# Patient Record
Sex: Female | Born: 1935 | Race: White | Hispanic: No | Smoking: Never smoker
Health system: Southern US, Community
[De-identification: ages and names within clinical notes are randomized; demographics above are authoritative.]

## PROBLEM LIST (undated history)

## (undated) ENCOUNTER — Ambulatory Visit: Admission: EM | Source: Home / Self Care

## (undated) DIAGNOSIS — M8580 Other specified disorders of bone density and structure, unspecified site: Secondary | ICD-10-CM

## (undated) DIAGNOSIS — I719 Aortic aneurysm of unspecified site, without rupture: Secondary | ICD-10-CM

## (undated) DIAGNOSIS — M25559 Pain in unspecified hip: Secondary | ICD-10-CM

## (undated) DIAGNOSIS — E785 Hyperlipidemia, unspecified: Secondary | ICD-10-CM

## (undated) DIAGNOSIS — F419 Anxiety disorder, unspecified: Secondary | ICD-10-CM

## (undated) DIAGNOSIS — I350 Nonrheumatic aortic (valve) stenosis: Secondary | ICD-10-CM

## (undated) DIAGNOSIS — H9190 Unspecified hearing loss, unspecified ear: Secondary | ICD-10-CM

## (undated) DIAGNOSIS — G8929 Other chronic pain: Secondary | ICD-10-CM

## (undated) DIAGNOSIS — M1990 Unspecified osteoarthritis, unspecified site: Secondary | ICD-10-CM

## (undated) DIAGNOSIS — I2510 Atherosclerotic heart disease of native coronary artery without angina pectoris: Secondary | ICD-10-CM

## (undated) DIAGNOSIS — I10 Essential (primary) hypertension: Secondary | ICD-10-CM

## (undated) DIAGNOSIS — N809 Endometriosis, unspecified: Secondary | ICD-10-CM

## (undated) DIAGNOSIS — N3946 Mixed incontinence: Secondary | ICD-10-CM

## (undated) HISTORY — DX: Other specified disorders of bone density and structure, unspecified site: M85.80

## (undated) HISTORY — DX: Mixed incontinence: N39.46

## (undated) HISTORY — DX: Atherosclerotic heart disease of native coronary artery without angina pectoris: I25.10

## (undated) HISTORY — DX: Aortic aneurysm of unspecified site, without rupture: I71.9

## (undated) HISTORY — PX: APPENDECTOMY: SHX54

## (undated) HISTORY — DX: Essential (primary) hypertension: I10

## (undated) HISTORY — DX: Pain in unspecified hip: M25.559

## (undated) HISTORY — DX: Endometriosis, unspecified: N80.9

## (undated) HISTORY — DX: Hyperlipidemia, unspecified: E78.5

## (undated) HISTORY — DX: Other chronic pain: G89.29

## (undated) HISTORY — DX: Anxiety disorder, unspecified: F41.9

## (undated) HISTORY — DX: Unspecified hearing loss, unspecified ear: H91.90

## (undated) HISTORY — DX: Nonrheumatic aortic (valve) stenosis: I35.0

## (undated) HISTORY — DX: Unspecified osteoarthritis, unspecified site: M19.90

---

## 1974-08-08 HISTORY — PX: ABDOMINAL HYSTERECTOMY: SHX81

## 1985-08-08 HISTORY — PX: BREAST EXCISIONAL BIOPSY: SUR124

## 2002-03-05 ENCOUNTER — Other Ambulatory Visit: Admission: RE | Admit: 2002-03-05 | Discharge: 2002-03-05 | Payer: Self-pay | Admitting: Internal Medicine

## 2004-06-03 ENCOUNTER — Ambulatory Visit: Payer: Self-pay | Admitting: Gastroenterology

## 2005-04-04 ENCOUNTER — Ambulatory Visit: Payer: Self-pay | Admitting: Internal Medicine

## 2006-04-13 ENCOUNTER — Ambulatory Visit: Payer: Self-pay | Admitting: Internal Medicine

## 2006-05-04 ENCOUNTER — Other Ambulatory Visit: Payer: Self-pay

## 2006-05-04 ENCOUNTER — Emergency Department: Payer: Self-pay | Admitting: Emergency Medicine

## 2007-04-26 ENCOUNTER — Ambulatory Visit: Payer: Self-pay | Admitting: Internal Medicine

## 2008-04-30 ENCOUNTER — Ambulatory Visit: Payer: Self-pay | Admitting: Internal Medicine

## 2009-05-04 ENCOUNTER — Ambulatory Visit: Payer: Self-pay | Admitting: Internal Medicine

## 2010-03-05 ENCOUNTER — Ambulatory Visit: Payer: Self-pay | Admitting: Internal Medicine

## 2010-05-11 ENCOUNTER — Ambulatory Visit: Payer: Self-pay | Admitting: Internal Medicine

## 2011-03-07 ENCOUNTER — Emergency Department: Payer: Self-pay | Admitting: *Deleted

## 2011-06-16 ENCOUNTER — Ambulatory Visit: Payer: Self-pay | Admitting: Internal Medicine

## 2011-09-20 DIAGNOSIS — M999 Biomechanical lesion, unspecified: Secondary | ICD-10-CM | POA: Diagnosis not present

## 2011-09-20 DIAGNOSIS — M5137 Other intervertebral disc degeneration, lumbosacral region: Secondary | ICD-10-CM | POA: Diagnosis not present

## 2011-09-20 DIAGNOSIS — M531 Cervicobrachial syndrome: Secondary | ICD-10-CM | POA: Diagnosis not present

## 2011-09-20 DIAGNOSIS — M9981 Other biomechanical lesions of cervical region: Secondary | ICD-10-CM | POA: Diagnosis not present

## 2011-09-20 DIAGNOSIS — M545 Low back pain, unspecified: Secondary | ICD-10-CM | POA: Diagnosis not present

## 2011-10-03 DIAGNOSIS — M545 Low back pain, unspecified: Secondary | ICD-10-CM | POA: Diagnosis not present

## 2011-10-03 DIAGNOSIS — M9981 Other biomechanical lesions of cervical region: Secondary | ICD-10-CM | POA: Diagnosis not present

## 2011-10-03 DIAGNOSIS — M531 Cervicobrachial syndrome: Secondary | ICD-10-CM | POA: Diagnosis not present

## 2011-10-03 DIAGNOSIS — M5137 Other intervertebral disc degeneration, lumbosacral region: Secondary | ICD-10-CM | POA: Diagnosis not present

## 2011-10-03 DIAGNOSIS — M999 Biomechanical lesion, unspecified: Secondary | ICD-10-CM | POA: Diagnosis not present

## 2011-10-06 DIAGNOSIS — IMO0002 Reserved for concepts with insufficient information to code with codable children: Secondary | ICD-10-CM | POA: Diagnosis not present

## 2011-10-10 DIAGNOSIS — M2040 Other hammer toe(s) (acquired), unspecified foot: Secondary | ICD-10-CM | POA: Diagnosis not present

## 2011-10-11 DIAGNOSIS — M545 Low back pain, unspecified: Secondary | ICD-10-CM | POA: Diagnosis not present

## 2011-10-11 DIAGNOSIS — M5137 Other intervertebral disc degeneration, lumbosacral region: Secondary | ICD-10-CM | POA: Diagnosis not present

## 2011-10-25 DIAGNOSIS — M545 Low back pain, unspecified: Secondary | ICD-10-CM | POA: Diagnosis not present

## 2011-10-25 DIAGNOSIS — IMO0002 Reserved for concepts with insufficient information to code with codable children: Secondary | ICD-10-CM | POA: Diagnosis not present

## 2011-10-25 DIAGNOSIS — M5137 Other intervertebral disc degeneration, lumbosacral region: Secondary | ICD-10-CM | POA: Diagnosis not present

## 2011-10-28 DIAGNOSIS — I10 Essential (primary) hypertension: Secondary | ICD-10-CM | POA: Diagnosis not present

## 2011-10-28 DIAGNOSIS — I253 Aneurysm of heart: Secondary | ICD-10-CM | POA: Diagnosis not present

## 2011-10-28 DIAGNOSIS — I2510 Atherosclerotic heart disease of native coronary artery without angina pectoris: Secondary | ICD-10-CM | POA: Diagnosis not present

## 2011-10-28 DIAGNOSIS — M810 Age-related osteoporosis without current pathological fracture: Secondary | ICD-10-CM | POA: Diagnosis not present

## 2011-10-28 DIAGNOSIS — R143 Flatulence: Secondary | ICD-10-CM | POA: Diagnosis not present

## 2011-10-28 DIAGNOSIS — R141 Gas pain: Secondary | ICD-10-CM | POA: Diagnosis not present

## 2011-11-01 DIAGNOSIS — IMO0002 Reserved for concepts with insufficient information to code with codable children: Secondary | ICD-10-CM | POA: Diagnosis not present

## 2011-11-01 DIAGNOSIS — M5137 Other intervertebral disc degeneration, lumbosacral region: Secondary | ICD-10-CM | POA: Diagnosis not present

## 2011-11-01 DIAGNOSIS — M545 Low back pain, unspecified: Secondary | ICD-10-CM | POA: Diagnosis not present

## 2011-11-14 DIAGNOSIS — E785 Hyperlipidemia, unspecified: Secondary | ICD-10-CM | POA: Diagnosis not present

## 2011-11-14 DIAGNOSIS — I253 Aneurysm of heart: Secondary | ICD-10-CM | POA: Diagnosis not present

## 2011-11-14 DIAGNOSIS — I2510 Atherosclerotic heart disease of native coronary artery without angina pectoris: Secondary | ICD-10-CM | POA: Diagnosis not present

## 2011-11-14 DIAGNOSIS — I10 Essential (primary) hypertension: Secondary | ICD-10-CM | POA: Diagnosis not present

## 2011-11-22 DIAGNOSIS — M545 Low back pain, unspecified: Secondary | ICD-10-CM | POA: Diagnosis not present

## 2011-11-22 DIAGNOSIS — IMO0002 Reserved for concepts with insufficient information to code with codable children: Secondary | ICD-10-CM | POA: Diagnosis not present

## 2011-11-22 DIAGNOSIS — M5137 Other intervertebral disc degeneration, lumbosacral region: Secondary | ICD-10-CM | POA: Diagnosis not present

## 2011-12-06 DIAGNOSIS — IMO0002 Reserved for concepts with insufficient information to code with codable children: Secondary | ICD-10-CM | POA: Diagnosis not present

## 2011-12-06 DIAGNOSIS — M545 Low back pain, unspecified: Secondary | ICD-10-CM | POA: Diagnosis not present

## 2011-12-06 DIAGNOSIS — M5137 Other intervertebral disc degeneration, lumbosacral region: Secondary | ICD-10-CM | POA: Diagnosis not present

## 2011-12-19 DIAGNOSIS — H169 Unspecified keratitis: Secondary | ICD-10-CM | POA: Diagnosis not present

## 2012-01-21 DIAGNOSIS — IMO0002 Reserved for concepts with insufficient information to code with codable children: Secondary | ICD-10-CM | POA: Diagnosis not present

## 2012-01-21 DIAGNOSIS — M999 Biomechanical lesion, unspecified: Secondary | ICD-10-CM | POA: Diagnosis not present

## 2012-01-21 DIAGNOSIS — M9981 Other biomechanical lesions of cervical region: Secondary | ICD-10-CM | POA: Diagnosis not present

## 2012-01-21 DIAGNOSIS — M530 Cervicocranial syndrome: Secondary | ICD-10-CM | POA: Diagnosis not present

## 2012-01-23 DIAGNOSIS — IMO0002 Reserved for concepts with insufficient information to code with codable children: Secondary | ICD-10-CM | POA: Diagnosis not present

## 2012-01-23 DIAGNOSIS — M530 Cervicocranial syndrome: Secondary | ICD-10-CM | POA: Diagnosis not present

## 2012-01-23 DIAGNOSIS — M999 Biomechanical lesion, unspecified: Secondary | ICD-10-CM | POA: Diagnosis not present

## 2012-01-23 DIAGNOSIS — M9981 Other biomechanical lesions of cervical region: Secondary | ICD-10-CM | POA: Diagnosis not present

## 2012-01-25 DIAGNOSIS — M9981 Other biomechanical lesions of cervical region: Secondary | ICD-10-CM | POA: Diagnosis not present

## 2012-01-25 DIAGNOSIS — M999 Biomechanical lesion, unspecified: Secondary | ICD-10-CM | POA: Diagnosis not present

## 2012-01-25 DIAGNOSIS — IMO0002 Reserved for concepts with insufficient information to code with codable children: Secondary | ICD-10-CM | POA: Diagnosis not present

## 2012-01-25 DIAGNOSIS — M531 Cervicobrachial syndrome: Secondary | ICD-10-CM | POA: Diagnosis not present

## 2012-01-27 DIAGNOSIS — M9981 Other biomechanical lesions of cervical region: Secondary | ICD-10-CM | POA: Diagnosis not present

## 2012-01-27 DIAGNOSIS — M999 Biomechanical lesion, unspecified: Secondary | ICD-10-CM | POA: Diagnosis not present

## 2012-01-27 DIAGNOSIS — IMO0002 Reserved for concepts with insufficient information to code with codable children: Secondary | ICD-10-CM | POA: Diagnosis not present

## 2012-01-27 DIAGNOSIS — M531 Cervicobrachial syndrome: Secondary | ICD-10-CM | POA: Diagnosis not present

## 2012-02-02 DIAGNOSIS — M531 Cervicobrachial syndrome: Secondary | ICD-10-CM | POA: Diagnosis not present

## 2012-02-02 DIAGNOSIS — M999 Biomechanical lesion, unspecified: Secondary | ICD-10-CM | POA: Diagnosis not present

## 2012-02-02 DIAGNOSIS — IMO0002 Reserved for concepts with insufficient information to code with codable children: Secondary | ICD-10-CM | POA: Diagnosis not present

## 2012-02-02 DIAGNOSIS — M9981 Other biomechanical lesions of cervical region: Secondary | ICD-10-CM | POA: Diagnosis not present

## 2012-02-06 DIAGNOSIS — M531 Cervicobrachial syndrome: Secondary | ICD-10-CM | POA: Diagnosis not present

## 2012-02-06 DIAGNOSIS — M999 Biomechanical lesion, unspecified: Secondary | ICD-10-CM | POA: Diagnosis not present

## 2012-02-06 DIAGNOSIS — M9981 Other biomechanical lesions of cervical region: Secondary | ICD-10-CM | POA: Diagnosis not present

## 2012-02-06 DIAGNOSIS — IMO0002 Reserved for concepts with insufficient information to code with codable children: Secondary | ICD-10-CM | POA: Diagnosis not present

## 2012-02-10 DIAGNOSIS — M531 Cervicobrachial syndrome: Secondary | ICD-10-CM | POA: Diagnosis not present

## 2012-02-10 DIAGNOSIS — M9981 Other biomechanical lesions of cervical region: Secondary | ICD-10-CM | POA: Diagnosis not present

## 2012-02-10 DIAGNOSIS — M999 Biomechanical lesion, unspecified: Secondary | ICD-10-CM | POA: Diagnosis not present

## 2012-02-10 DIAGNOSIS — IMO0002 Reserved for concepts with insufficient information to code with codable children: Secondary | ICD-10-CM | POA: Diagnosis not present

## 2012-02-20 DIAGNOSIS — M9981 Other biomechanical lesions of cervical region: Secondary | ICD-10-CM | POA: Diagnosis not present

## 2012-02-20 DIAGNOSIS — IMO0002 Reserved for concepts with insufficient information to code with codable children: Secondary | ICD-10-CM | POA: Diagnosis not present

## 2012-02-20 DIAGNOSIS — M999 Biomechanical lesion, unspecified: Secondary | ICD-10-CM | POA: Diagnosis not present

## 2012-02-20 DIAGNOSIS — M531 Cervicobrachial syndrome: Secondary | ICD-10-CM | POA: Diagnosis not present

## 2012-03-08 DIAGNOSIS — M531 Cervicobrachial syndrome: Secondary | ICD-10-CM | POA: Diagnosis not present

## 2012-03-08 DIAGNOSIS — M999 Biomechanical lesion, unspecified: Secondary | ICD-10-CM | POA: Diagnosis not present

## 2012-03-08 DIAGNOSIS — IMO0002 Reserved for concepts with insufficient information to code with codable children: Secondary | ICD-10-CM | POA: Diagnosis not present

## 2012-03-08 DIAGNOSIS — M9981 Other biomechanical lesions of cervical region: Secondary | ICD-10-CM | POA: Diagnosis not present

## 2012-03-26 DIAGNOSIS — M531 Cervicobrachial syndrome: Secondary | ICD-10-CM | POA: Diagnosis not present

## 2012-03-26 DIAGNOSIS — M9981 Other biomechanical lesions of cervical region: Secondary | ICD-10-CM | POA: Diagnosis not present

## 2012-03-26 DIAGNOSIS — M999 Biomechanical lesion, unspecified: Secondary | ICD-10-CM | POA: Diagnosis not present

## 2012-03-26 DIAGNOSIS — IMO0002 Reserved for concepts with insufficient information to code with codable children: Secondary | ICD-10-CM | POA: Diagnosis not present

## 2012-05-02 DIAGNOSIS — M810 Age-related osteoporosis without current pathological fracture: Secondary | ICD-10-CM | POA: Diagnosis not present

## 2012-05-02 DIAGNOSIS — E785 Hyperlipidemia, unspecified: Secondary | ICD-10-CM | POA: Diagnosis not present

## 2012-05-02 DIAGNOSIS — Z0000 Encounter for general adult medical examination without abnormal findings: Secondary | ICD-10-CM | POA: Diagnosis not present

## 2012-05-02 DIAGNOSIS — Z23 Encounter for immunization: Secondary | ICD-10-CM | POA: Diagnosis not present

## 2012-05-02 DIAGNOSIS — I10 Essential (primary) hypertension: Secondary | ICD-10-CM | POA: Diagnosis not present

## 2012-05-11 DIAGNOSIS — R7309 Other abnormal glucose: Secondary | ICD-10-CM | POA: Diagnosis not present

## 2012-05-31 DIAGNOSIS — H905 Unspecified sensorineural hearing loss: Secondary | ICD-10-CM | POA: Diagnosis not present

## 2012-05-31 DIAGNOSIS — H903 Sensorineural hearing loss, bilateral: Secondary | ICD-10-CM | POA: Diagnosis not present

## 2012-06-28 ENCOUNTER — Ambulatory Visit: Payer: Self-pay

## 2012-06-28 DIAGNOSIS — Z1231 Encounter for screening mammogram for malignant neoplasm of breast: Secondary | ICD-10-CM | POA: Diagnosis not present

## 2012-06-28 DIAGNOSIS — N6459 Other signs and symptoms in breast: Secondary | ICD-10-CM | POA: Diagnosis not present

## 2012-07-02 DIAGNOSIS — IMO0002 Reserved for concepts with insufficient information to code with codable children: Secondary | ICD-10-CM | POA: Diagnosis not present

## 2012-07-02 DIAGNOSIS — M531 Cervicobrachial syndrome: Secondary | ICD-10-CM | POA: Diagnosis not present

## 2012-07-02 DIAGNOSIS — M9981 Other biomechanical lesions of cervical region: Secondary | ICD-10-CM | POA: Diagnosis not present

## 2012-07-02 DIAGNOSIS — M999 Biomechanical lesion, unspecified: Secondary | ICD-10-CM | POA: Diagnosis not present

## 2012-07-03 ENCOUNTER — Ambulatory Visit: Payer: Self-pay

## 2012-07-03 DIAGNOSIS — N6459 Other signs and symptoms in breast: Secondary | ICD-10-CM | POA: Diagnosis not present

## 2012-07-16 DIAGNOSIS — M531 Cervicobrachial syndrome: Secondary | ICD-10-CM | POA: Diagnosis not present

## 2012-07-16 DIAGNOSIS — M999 Biomechanical lesion, unspecified: Secondary | ICD-10-CM | POA: Diagnosis not present

## 2012-07-16 DIAGNOSIS — IMO0002 Reserved for concepts with insufficient information to code with codable children: Secondary | ICD-10-CM | POA: Diagnosis not present

## 2012-07-16 DIAGNOSIS — M9981 Other biomechanical lesions of cervical region: Secondary | ICD-10-CM | POA: Diagnosis not present

## 2012-08-03 DIAGNOSIS — M531 Cervicobrachial syndrome: Secondary | ICD-10-CM | POA: Diagnosis not present

## 2012-08-03 DIAGNOSIS — M9981 Other biomechanical lesions of cervical region: Secondary | ICD-10-CM | POA: Diagnosis not present

## 2012-08-03 DIAGNOSIS — M999 Biomechanical lesion, unspecified: Secondary | ICD-10-CM | POA: Diagnosis not present

## 2012-08-03 DIAGNOSIS — IMO0002 Reserved for concepts with insufficient information to code with codable children: Secondary | ICD-10-CM | POA: Diagnosis not present

## 2012-08-16 DIAGNOSIS — M999 Biomechanical lesion, unspecified: Secondary | ICD-10-CM | POA: Diagnosis not present

## 2012-08-16 DIAGNOSIS — M531 Cervicobrachial syndrome: Secondary | ICD-10-CM | POA: Diagnosis not present

## 2012-08-16 DIAGNOSIS — M9981 Other biomechanical lesions of cervical region: Secondary | ICD-10-CM | POA: Diagnosis not present

## 2012-08-16 DIAGNOSIS — IMO0002 Reserved for concepts with insufficient information to code with codable children: Secondary | ICD-10-CM | POA: Diagnosis not present

## 2012-08-28 DIAGNOSIS — M9981 Other biomechanical lesions of cervical region: Secondary | ICD-10-CM | POA: Diagnosis not present

## 2012-08-28 DIAGNOSIS — M531 Cervicobrachial syndrome: Secondary | ICD-10-CM | POA: Diagnosis not present

## 2012-08-28 DIAGNOSIS — M999 Biomechanical lesion, unspecified: Secondary | ICD-10-CM | POA: Diagnosis not present

## 2012-08-28 DIAGNOSIS — IMO0002 Reserved for concepts with insufficient information to code with codable children: Secondary | ICD-10-CM | POA: Diagnosis not present

## 2012-09-10 DIAGNOSIS — M999 Biomechanical lesion, unspecified: Secondary | ICD-10-CM | POA: Diagnosis not present

## 2012-09-10 DIAGNOSIS — IMO0002 Reserved for concepts with insufficient information to code with codable children: Secondary | ICD-10-CM | POA: Diagnosis not present

## 2012-09-10 DIAGNOSIS — M531 Cervicobrachial syndrome: Secondary | ICD-10-CM | POA: Diagnosis not present

## 2012-09-10 DIAGNOSIS — M9981 Other biomechanical lesions of cervical region: Secondary | ICD-10-CM | POA: Diagnosis not present

## 2012-09-24 DIAGNOSIS — M531 Cervicobrachial syndrome: Secondary | ICD-10-CM | POA: Diagnosis not present

## 2012-09-24 DIAGNOSIS — M9981 Other biomechanical lesions of cervical region: Secondary | ICD-10-CM | POA: Diagnosis not present

## 2012-09-24 DIAGNOSIS — IMO0002 Reserved for concepts with insufficient information to code with codable children: Secondary | ICD-10-CM | POA: Diagnosis not present

## 2012-09-24 DIAGNOSIS — M999 Biomechanical lesion, unspecified: Secondary | ICD-10-CM | POA: Diagnosis not present

## 2012-10-03 DIAGNOSIS — E782 Mixed hyperlipidemia: Secondary | ICD-10-CM | POA: Diagnosis not present

## 2012-10-03 DIAGNOSIS — I10 Essential (primary) hypertension: Secondary | ICD-10-CM | POA: Diagnosis not present

## 2012-11-06 DIAGNOSIS — I10 Essential (primary) hypertension: Secondary | ICD-10-CM | POA: Diagnosis not present

## 2012-11-06 DIAGNOSIS — E785 Hyperlipidemia, unspecified: Secondary | ICD-10-CM | POA: Diagnosis not present

## 2012-11-06 DIAGNOSIS — I2510 Atherosclerotic heart disease of native coronary artery without angina pectoris: Secondary | ICD-10-CM | POA: Diagnosis not present

## 2013-04-11 DIAGNOSIS — M461 Sacroiliitis, not elsewhere classified: Secondary | ICD-10-CM | POA: Diagnosis not present

## 2013-04-11 DIAGNOSIS — M259 Joint disorder, unspecified: Secondary | ICD-10-CM | POA: Diagnosis not present

## 2013-04-11 DIAGNOSIS — M999 Biomechanical lesion, unspecified: Secondary | ICD-10-CM | POA: Diagnosis not present

## 2013-04-12 DIAGNOSIS — M461 Sacroiliitis, not elsewhere classified: Secondary | ICD-10-CM | POA: Diagnosis not present

## 2013-04-12 DIAGNOSIS — M999 Biomechanical lesion, unspecified: Secondary | ICD-10-CM | POA: Diagnosis not present

## 2013-04-12 DIAGNOSIS — M259 Joint disorder, unspecified: Secondary | ICD-10-CM | POA: Diagnosis not present

## 2013-04-18 DIAGNOSIS — M722 Plantar fascial fibromatosis: Secondary | ICD-10-CM | POA: Diagnosis not present

## 2013-04-18 DIAGNOSIS — E785 Hyperlipidemia, unspecified: Secondary | ICD-10-CM | POA: Diagnosis not present

## 2013-04-18 DIAGNOSIS — S93409A Sprain of unspecified ligament of unspecified ankle, initial encounter: Secondary | ICD-10-CM | POA: Diagnosis not present

## 2013-06-10 ENCOUNTER — Telehealth: Payer: Self-pay | Admitting: *Deleted

## 2013-06-10 DIAGNOSIS — Z23 Encounter for immunization: Secondary | ICD-10-CM | POA: Diagnosis not present

## 2013-06-10 MED ORDER — AMLODIPINE BESYLATE 5 MG PO TABS: 5.0000 mg | ORAL_TABLET | Freq: Every day | ORAL | Status: DC

## 2013-06-10 NOTE — Telephone Encounter (Signed)
done

## 2013-06-24 DIAGNOSIS — E785 Hyperlipidemia, unspecified: Secondary | ICD-10-CM | POA: Diagnosis not present

## 2013-06-24 DIAGNOSIS — Z0000 Encounter for general adult medical examination without abnormal findings: Secondary | ICD-10-CM | POA: Diagnosis not present

## 2013-06-24 DIAGNOSIS — I10 Essential (primary) hypertension: Secondary | ICD-10-CM | POA: Diagnosis not present

## 2013-06-24 DIAGNOSIS — M899 Disorder of bone, unspecified: Secondary | ICD-10-CM | POA: Diagnosis not present

## 2013-06-27 ENCOUNTER — Other Ambulatory Visit: Payer: Self-pay

## 2013-06-27 MED ORDER — ESTROGENS CONJUGATED 0.625 MG PO TABS: 0.6250 mg | ORAL_TABLET | Freq: Every day | ORAL | Status: DC

## 2013-06-27 MED ORDER — ESTROGENS CONJUGATED 0.625 MG PO TABS: ORAL_TABLET | ORAL | Status: DC

## 2013-06-27 MED ORDER — METOPROLOL SUCCINATE ER 50 MG PO TB24: 50.0000 mg | ORAL_TABLET | Freq: Every day | ORAL | Status: DC

## 2013-06-27 MED ORDER — SIMVASTATIN 40 MG PO TABS: 40.0000 mg | ORAL_TABLET | Freq: Every day | ORAL | Status: DC

## 2013-06-27 MED ORDER — LISINOPRIL 40 MG PO TABS: 40.0000 mg | ORAL_TABLET | Freq: Every day | ORAL | Status: DC

## 2013-06-27 MED ORDER — ESTROGENS, CONJUGATED 0.625 MG/GM VA CREA: 1.0000 | TOPICAL_CREAM | Freq: Every day | VAGINAL | Status: DC

## 2013-07-02 DIAGNOSIS — M259 Joint disorder, unspecified: Secondary | ICD-10-CM | POA: Diagnosis not present

## 2013-07-02 DIAGNOSIS — M461 Sacroiliitis, not elsewhere classified: Secondary | ICD-10-CM | POA: Diagnosis not present

## 2013-07-02 DIAGNOSIS — M999 Biomechanical lesion, unspecified: Secondary | ICD-10-CM | POA: Diagnosis not present

## 2013-07-12 DIAGNOSIS — H903 Sensorineural hearing loss, bilateral: Secondary | ICD-10-CM | POA: Diagnosis not present

## 2013-07-12 DIAGNOSIS — H6120 Impacted cerumen, unspecified ear: Secondary | ICD-10-CM | POA: Diagnosis not present

## 2013-07-18 DIAGNOSIS — M999 Biomechanical lesion, unspecified: Secondary | ICD-10-CM | POA: Diagnosis not present

## 2013-07-18 DIAGNOSIS — IMO0002 Reserved for concepts with insufficient information to code with codable children: Secondary | ICD-10-CM | POA: Diagnosis not present

## 2013-07-18 DIAGNOSIS — M955 Acquired deformity of pelvis: Secondary | ICD-10-CM | POA: Diagnosis not present

## 2013-07-19 ENCOUNTER — Ambulatory Visit: Payer: Self-pay

## 2013-07-19 DIAGNOSIS — Z1231 Encounter for screening mammogram for malignant neoplasm of breast: Secondary | ICD-10-CM | POA: Diagnosis not present

## 2013-07-25 DIAGNOSIS — N3946 Mixed incontinence: Secondary | ICD-10-CM | POA: Diagnosis not present

## 2013-07-25 DIAGNOSIS — I719 Aortic aneurysm of unspecified site, without rupture: Secondary | ICD-10-CM | POA: Diagnosis not present

## 2013-07-25 DIAGNOSIS — E785 Hyperlipidemia, unspecified: Secondary | ICD-10-CM | POA: Diagnosis not present

## 2013-07-25 DIAGNOSIS — I10 Essential (primary) hypertension: Secondary | ICD-10-CM | POA: Diagnosis not present

## 2013-08-06 DIAGNOSIS — M999 Biomechanical lesion, unspecified: Secondary | ICD-10-CM | POA: Diagnosis not present

## 2013-08-06 DIAGNOSIS — M955 Acquired deformity of pelvis: Secondary | ICD-10-CM | POA: Diagnosis not present

## 2013-08-06 DIAGNOSIS — IMO0002 Reserved for concepts with insufficient information to code with codable children: Secondary | ICD-10-CM | POA: Diagnosis not present

## 2013-08-16 DIAGNOSIS — IMO0001 Reserved for inherently not codable concepts without codable children: Secondary | ICD-10-CM | POA: Diagnosis not present

## 2013-08-16 DIAGNOSIS — R293 Abnormal posture: Secondary | ICD-10-CM | POA: Diagnosis not present

## 2013-08-16 DIAGNOSIS — M6281 Muscle weakness (generalized): Secondary | ICD-10-CM | POA: Diagnosis not present

## 2013-08-16 DIAGNOSIS — M545 Low back pain, unspecified: Secondary | ICD-10-CM | POA: Diagnosis not present

## 2013-08-16 DIAGNOSIS — N3946 Mixed incontinence: Secondary | ICD-10-CM | POA: Diagnosis not present

## 2013-08-26 DIAGNOSIS — IMO0002 Reserved for concepts with insufficient information to code with codable children: Secondary | ICD-10-CM | POA: Diagnosis not present

## 2013-08-26 DIAGNOSIS — M999 Biomechanical lesion, unspecified: Secondary | ICD-10-CM | POA: Diagnosis not present

## 2013-08-26 DIAGNOSIS — M955 Acquired deformity of pelvis: Secondary | ICD-10-CM | POA: Diagnosis not present

## 2013-09-08 DIAGNOSIS — M545 Low back pain, unspecified: Secondary | ICD-10-CM | POA: Diagnosis not present

## 2013-09-08 DIAGNOSIS — R293 Abnormal posture: Secondary | ICD-10-CM | POA: Diagnosis not present

## 2013-09-08 DIAGNOSIS — N3946 Mixed incontinence: Secondary | ICD-10-CM | POA: Diagnosis not present

## 2013-09-08 DIAGNOSIS — M6281 Muscle weakness (generalized): Secondary | ICD-10-CM | POA: Diagnosis not present

## 2013-09-08 DIAGNOSIS — IMO0001 Reserved for inherently not codable concepts without codable children: Secondary | ICD-10-CM | POA: Diagnosis not present

## 2013-09-13 DIAGNOSIS — N3946 Mixed incontinence: Secondary | ICD-10-CM | POA: Diagnosis not present

## 2013-09-13 DIAGNOSIS — M25559 Pain in unspecified hip: Secondary | ICD-10-CM | POA: Diagnosis not present

## 2013-09-14 DIAGNOSIS — T169XXA Foreign body in ear, unspecified ear, initial encounter: Secondary | ICD-10-CM | POA: Diagnosis not present

## 2013-09-18 DIAGNOSIS — M549 Dorsalgia, unspecified: Secondary | ICD-10-CM | POA: Diagnosis not present

## 2013-09-30 DIAGNOSIS — IMO0002 Reserved for concepts with insufficient information to code with codable children: Secondary | ICD-10-CM | POA: Diagnosis not present

## 2013-09-30 DIAGNOSIS — M955 Acquired deformity of pelvis: Secondary | ICD-10-CM | POA: Diagnosis not present

## 2013-09-30 DIAGNOSIS — M999 Biomechanical lesion, unspecified: Secondary | ICD-10-CM | POA: Diagnosis not present

## 2013-10-23 ENCOUNTER — Other Ambulatory Visit: Payer: Self-pay | Admitting: Interventional Cardiology

## 2013-10-28 DIAGNOSIS — M955 Acquired deformity of pelvis: Secondary | ICD-10-CM | POA: Diagnosis not present

## 2013-10-28 DIAGNOSIS — IMO0002 Reserved for concepts with insufficient information to code with codable children: Secondary | ICD-10-CM | POA: Diagnosis not present

## 2013-10-28 DIAGNOSIS — M999 Biomechanical lesion, unspecified: Secondary | ICD-10-CM | POA: Diagnosis not present

## 2013-11-06 ENCOUNTER — Encounter: Payer: Self-pay | Admitting: Interventional Cardiology

## 2013-11-06 ENCOUNTER — Ambulatory Visit (INDEPENDENT_AMBULATORY_CARE_PROVIDER_SITE_OTHER): Payer: Medicare Other | Admitting: Interventional Cardiology

## 2013-11-06 VITALS — BP 140/80 | HR 74 | Ht 64.0 in | Wt 187.8 lb

## 2013-11-06 DIAGNOSIS — I2510 Atherosclerotic heart disease of native coronary artery without angina pectoris: Secondary | ICD-10-CM | POA: Diagnosis not present

## 2013-11-06 DIAGNOSIS — E785 Hyperlipidemia, unspecified: Secondary | ICD-10-CM

## 2013-11-06 DIAGNOSIS — I10 Essential (primary) hypertension: Secondary | ICD-10-CM | POA: Insufficient documentation

## 2013-11-06 LAB — LIPID PANEL
Cholesterol: 213 mg/dL — ABNORMAL HIGH (ref 0–200)
HDL: 51.9 mg/dL (ref >39.00)
LDL Cholesterol: 107 mg/dL — ABNORMAL HIGH (ref 0–99)
Total CHOL/HDL Ratio: 4
Triglycerides: 270 mg/dL — ABNORMAL HIGH (ref 0.0–149.0)
VLDL: 54 mg/dL — ABNORMAL HIGH (ref 0.0–40.0)

## 2013-11-06 LAB — BASIC METABOLIC PANEL
BUN: 21 mg/dL (ref 6–23)
CO2: 26 mEq/L (ref 19–32)
Calcium: 8.9 mg/dL (ref 8.4–10.5)
Chloride: 105 mEq/L (ref 96–112)
Creatinine, Ser: 0.6 mg/dL (ref 0.4–1.2)
GFR: 94.67 mL/min (ref >60.00)
Glucose, Bld: 91 mg/dL (ref 70–99)
Potassium: 3.6 mEq/L (ref 3.5–5.1)
Sodium: 138 mEq/L (ref 135–145)

## 2013-11-06 LAB — ALT: ALT: 30 U/L (ref 0–35)

## 2013-11-06 NOTE — Patient Instructions (Addendum)
Your physician recommends that you continue on your current medications as directed. Please refer to the Current Medication list given to you today.  Lab Today: Bmet, Lipid, Alt  Your physician wants you to follow-up in: 1 year You will receive a reminder letter in the mail two months in advance. If you don't receive a letter, please call our office to schedule the follow-up appointment.

## 2013-11-06 NOTE — Progress Notes (Signed)
Patient ID: Angel [name], female   DOB: [birth year], 80 y.o.   MRN: [id number] .    [address], [place]  [address] Phone: [phone] Fax:  [phone]  Date:  11/06/2013   ID:  Angel [name], Angel [name] 10-Nov-1930, MRN [id number]  PCP:  Herbert Moors, MD   ASSESSMENT:  1. Hypertension on 80 mg of lisinopril. Blood pressures control. 2. Presumed coronary artery disease, asymptomatic 3. Hyperlipidemia  PLAN:  1. Check a beam at, lipid, and ALT 2. Clinical followup in one year   SUBJECTIVE: Angel [name] is a 80 y.o. female who has no cardiac complaints. She remains active. She has multiple questions about her medications. We have questions about her dose of lisinopril which is 80 mg per day, higher than the typical upper limit dose of 40 mg daily. She denies orthopnea, PND, cough, chest pain, and edema.   Wt Readings from Last 3 Encounters:  11/06/13 187 lb 12.8 oz (85.186 kg)     No past medical history on file.  Current Outpatient Prescriptions  Medication Sig Dispense Refill  . amLODipine (NORVASC) 5 MG tablet Take 1 tablet (5 mg total) by mouth daily.  30 tablet  5  . busPIRone (BUSPAR) 15 MG tablet as needed.       [name] estrogens, conjugated, (PREMARIN) 0.625 MG tablet TAKE ONE TABLET EVERY OTHER DAY  30 tablet  1  . lisinopril (PRINIVIL,ZESTRIL) 40 MG tablet Take 80 mg by mouth daily.      . metoprolol succinate (TOPROL-XL) 50 MG 24 hr tablet Take 1 tablet (50 mg total) by mouth daily. Take with or immediately following a meal.  30 tablet  0  . simvastatin (ZOCOR) 40 MG tablet Take 1 tablet (40 mg total) by mouth at bedtime.  90 tablet  3   No current facility-administered medications for this visit.    Allergies:   Not on File  Social History:  The patient  reports that she has never smoked. She does not have any smokeless tobacco history on file. She reports that she drinks alcohol. She reports that she does not use illicit drugs.   ROS:   Please see the history of present illness.      All other systems reviewed and negative.   OBJECTIVE: VS:  BP 140/80  Pulse 74  Ht 5\' 4"  (1.626 m)  Wt 187 lb 12.8 oz (85.186 kg)  BMI 32.22 kg/m2 Well nourished, well developed, in no acute distress, appears than stated age HEENT: normal Neck: JVD flat. Carotid bruit absent  Cardiac:  normal S1, S2; RRR; no murmur Lungs:  clear to auscultation bilaterally, no wheezing, rhonchi or rales Abd: soft, nontender, no hepatomegaly Ext: Edema absent. Pulses 2+ Skin: warm and dry Neuro:  CNs 2-12 intact, no focal abnormalities noted  EKG:  Normal sinus rhythm with poor R-wave progression to left atrial abnormality       Signed, Illene Labrador III, MD 11/06/2013 10:21 AM

## 2013-11-11 ENCOUNTER — Telehealth: Payer: Self-pay

## 2013-11-11 NOTE — Telephone Encounter (Signed)
Message copied by Lamar Laundry on Mon Nov 11, 2013  8:11 AM ------      Message from: Daneen Schick      Created: Thu Nov 07, 2013  9:26 AM       BMET is normal.      Lipid panel is Okay. Not ideal, but will not increase dose of therapy. Diet and have PCP repeat in 6 months. Copy to PCP. ------

## 2013-11-11 NOTE — Telephone Encounter (Signed)
pt given lab results.BMET is normal. Lipid panel is Okay. Not ideal, but will not increase dose of therapy. Diet and have PCP repeat in 6 months. Copy to PCP.pt verbalized understanding.

## 2013-11-14 ENCOUNTER — Other Ambulatory Visit: Payer: Self-pay | Admitting: Interventional Cardiology

## 2013-12-10 ENCOUNTER — Other Ambulatory Visit: Payer: Self-pay | Admitting: Interventional Cardiology

## 2013-12-18 DIAGNOSIS — IMO0002 Reserved for concepts with insufficient information to code with codable children: Secondary | ICD-10-CM | POA: Diagnosis not present

## 2013-12-18 DIAGNOSIS — M955 Acquired deformity of pelvis: Secondary | ICD-10-CM | POA: Diagnosis not present

## 2013-12-18 DIAGNOSIS — M999 Biomechanical lesion, unspecified: Secondary | ICD-10-CM | POA: Diagnosis not present

## 2014-01-14 DIAGNOSIS — M955 Acquired deformity of pelvis: Secondary | ICD-10-CM | POA: Diagnosis not present

## 2014-01-14 DIAGNOSIS — IMO0002 Reserved for concepts with insufficient information to code with codable children: Secondary | ICD-10-CM | POA: Diagnosis not present

## 2014-01-14 DIAGNOSIS — M999 Biomechanical lesion, unspecified: Secondary | ICD-10-CM | POA: Diagnosis not present

## 2014-02-10 DIAGNOSIS — M999 Biomechanical lesion, unspecified: Secondary | ICD-10-CM | POA: Diagnosis not present

## 2014-02-10 DIAGNOSIS — IMO0002 Reserved for concepts with insufficient information to code with codable children: Secondary | ICD-10-CM | POA: Diagnosis not present

## 2014-02-10 DIAGNOSIS — M955 Acquired deformity of pelvis: Secondary | ICD-10-CM | POA: Diagnosis not present

## 2014-02-24 DIAGNOSIS — L259 Unspecified contact dermatitis, unspecified cause: Secondary | ICD-10-CM | POA: Diagnosis not present

## 2014-02-25 DIAGNOSIS — R21 Rash and other nonspecific skin eruption: Secondary | ICD-10-CM | POA: Diagnosis not present

## 2014-03-12 DIAGNOSIS — M899 Disorder of bone, unspecified: Secondary | ICD-10-CM | POA: Diagnosis not present

## 2014-03-12 DIAGNOSIS — I719 Aortic aneurysm of unspecified site, without rupture: Secondary | ICD-10-CM | POA: Diagnosis not present

## 2014-03-12 DIAGNOSIS — M955 Acquired deformity of pelvis: Secondary | ICD-10-CM | POA: Diagnosis not present

## 2014-03-12 DIAGNOSIS — Z79890 Hormone replacement therapy (postmenopausal): Secondary | ICD-10-CM | POA: Diagnosis not present

## 2014-03-12 DIAGNOSIS — N3946 Mixed incontinence: Secondary | ICD-10-CM | POA: Diagnosis not present

## 2014-03-12 DIAGNOSIS — M949 Disorder of cartilage, unspecified: Secondary | ICD-10-CM | POA: Diagnosis not present

## 2014-03-12 DIAGNOSIS — IMO0002 Reserved for concepts with insufficient information to code with codable children: Secondary | ICD-10-CM | POA: Diagnosis not present

## 2014-03-12 DIAGNOSIS — M999 Biomechanical lesion, unspecified: Secondary | ICD-10-CM | POA: Diagnosis not present

## 2014-04-07 DIAGNOSIS — IMO0002 Reserved for concepts with insufficient information to code with codable children: Secondary | ICD-10-CM | POA: Diagnosis not present

## 2014-04-07 DIAGNOSIS — M999 Biomechanical lesion, unspecified: Secondary | ICD-10-CM | POA: Diagnosis not present

## 2014-04-07 DIAGNOSIS — M955 Acquired deformity of pelvis: Secondary | ICD-10-CM | POA: Diagnosis not present

## 2014-05-05 DIAGNOSIS — L678 Other hair color and hair shaft abnormalities: Secondary | ICD-10-CM | POA: Diagnosis not present

## 2014-05-05 DIAGNOSIS — L819 Disorder of pigmentation, unspecified: Secondary | ICD-10-CM | POA: Diagnosis not present

## 2014-05-05 DIAGNOSIS — D235 Other benign neoplasm of skin of trunk: Secondary | ICD-10-CM | POA: Diagnosis not present

## 2014-05-05 DIAGNOSIS — D485 Neoplasm of uncertain behavior of skin: Secondary | ICD-10-CM | POA: Diagnosis not present

## 2014-05-05 DIAGNOSIS — L821 Other seborrheic keratosis: Secondary | ICD-10-CM | POA: Diagnosis not present

## 2014-05-05 DIAGNOSIS — D1800 Hemangioma unspecified site: Secondary | ICD-10-CM | POA: Diagnosis not present

## 2014-05-05 DIAGNOSIS — L738 Other specified follicular disorders: Secondary | ICD-10-CM | POA: Diagnosis not present

## 2014-05-12 DIAGNOSIS — M9905 Segmental and somatic dysfunction of pelvic region: Secondary | ICD-10-CM | POA: Diagnosis not present

## 2014-05-12 DIAGNOSIS — M9903 Segmental and somatic dysfunction of lumbar region: Secondary | ICD-10-CM | POA: Diagnosis not present

## 2014-05-12 DIAGNOSIS — M5416 Radiculopathy, lumbar region: Secondary | ICD-10-CM | POA: Diagnosis not present

## 2014-05-12 DIAGNOSIS — M955 Acquired deformity of pelvis: Secondary | ICD-10-CM | POA: Diagnosis not present

## 2014-05-14 DIAGNOSIS — N3946 Mixed incontinence: Secondary | ICD-10-CM | POA: Diagnosis not present

## 2014-05-14 DIAGNOSIS — Z23 Encounter for immunization: Secondary | ICD-10-CM | POA: Diagnosis not present

## 2014-06-07 ENCOUNTER — Other Ambulatory Visit: Payer: Self-pay | Admitting: Interventional Cardiology

## 2014-06-10 DIAGNOSIS — M5416 Radiculopathy, lumbar region: Secondary | ICD-10-CM | POA: Diagnosis not present

## 2014-06-10 DIAGNOSIS — M9905 Segmental and somatic dysfunction of pelvic region: Secondary | ICD-10-CM | POA: Diagnosis not present

## 2014-06-10 DIAGNOSIS — M9903 Segmental and somatic dysfunction of lumbar region: Secondary | ICD-10-CM | POA: Diagnosis not present

## 2014-06-10 DIAGNOSIS — M955 Acquired deformity of pelvis: Secondary | ICD-10-CM | POA: Diagnosis not present

## 2014-06-20 DIAGNOSIS — Z0000 Encounter for general adult medical examination without abnormal findings: Secondary | ICD-10-CM | POA: Diagnosis not present

## 2014-06-20 DIAGNOSIS — E669 Obesity, unspecified: Secondary | ICD-10-CM | POA: Diagnosis not present

## 2014-06-20 DIAGNOSIS — I10 Essential (primary) hypertension: Secondary | ICD-10-CM | POA: Diagnosis not present

## 2014-06-20 DIAGNOSIS — Z9181 History of falling: Secondary | ICD-10-CM | POA: Diagnosis not present

## 2014-06-20 DIAGNOSIS — Z1389 Encounter for screening for other disorder: Secondary | ICD-10-CM | POA: Diagnosis not present

## 2014-06-20 DIAGNOSIS — Z23 Encounter for immunization: Secondary | ICD-10-CM | POA: Diagnosis not present

## 2014-06-20 DIAGNOSIS — E785 Hyperlipidemia, unspecified: Secondary | ICD-10-CM | POA: Diagnosis not present

## 2014-06-20 DIAGNOSIS — M8580 Other specified disorders of bone density and structure, unspecified site: Secondary | ICD-10-CM | POA: Diagnosis not present

## 2014-06-27 DIAGNOSIS — N3941 Urge incontinence: Secondary | ICD-10-CM | POA: Diagnosis not present

## 2014-06-27 DIAGNOSIS — R339 Retention of urine, unspecified: Secondary | ICD-10-CM | POA: Diagnosis not present

## 2014-06-27 DIAGNOSIS — N393 Stress incontinence (female) (male): Secondary | ICD-10-CM | POA: Diagnosis not present

## 2014-06-29 DIAGNOSIS — R339 Retention of urine, unspecified: Secondary | ICD-10-CM | POA: Insufficient documentation

## 2014-06-29 DIAGNOSIS — N393 Stress incontinence (female) (male): Secondary | ICD-10-CM | POA: Insufficient documentation

## 2014-06-29 DIAGNOSIS — N3946 Mixed incontinence: Secondary | ICD-10-CM | POA: Insufficient documentation

## 2014-06-29 DIAGNOSIS — N3941 Urge incontinence: Secondary | ICD-10-CM | POA: Insufficient documentation

## 2014-07-04 ENCOUNTER — Other Ambulatory Visit: Payer: Self-pay | Admitting: Interventional Cardiology

## 2014-07-07 DIAGNOSIS — M5416 Radiculopathy, lumbar region: Secondary | ICD-10-CM | POA: Diagnosis not present

## 2014-07-07 DIAGNOSIS — M955 Acquired deformity of pelvis: Secondary | ICD-10-CM | POA: Diagnosis not present

## 2014-07-07 DIAGNOSIS — M9903 Segmental and somatic dysfunction of lumbar region: Secondary | ICD-10-CM | POA: Diagnosis not present

## 2014-07-07 DIAGNOSIS — M9905 Segmental and somatic dysfunction of pelvic region: Secondary | ICD-10-CM | POA: Diagnosis not present

## 2014-07-25 ENCOUNTER — Ambulatory Visit: Payer: Self-pay | Admitting: Family Medicine

## 2014-07-25 DIAGNOSIS — Z1231 Encounter for screening mammogram for malignant neoplasm of breast: Secondary | ICD-10-CM | POA: Diagnosis not present

## 2014-07-27 ENCOUNTER — Other Ambulatory Visit: Payer: Self-pay | Admitting: Interventional Cardiology

## 2014-08-05 DIAGNOSIS — M955 Acquired deformity of pelvis: Secondary | ICD-10-CM | POA: Diagnosis not present

## 2014-08-05 DIAGNOSIS — M9903 Segmental and somatic dysfunction of lumbar region: Secondary | ICD-10-CM | POA: Diagnosis not present

## 2014-08-05 DIAGNOSIS — M9905 Segmental and somatic dysfunction of pelvic region: Secondary | ICD-10-CM | POA: Diagnosis not present

## 2014-08-05 DIAGNOSIS — M5416 Radiculopathy, lumbar region: Secondary | ICD-10-CM | POA: Diagnosis not present

## 2014-08-12 DIAGNOSIS — I10 Essential (primary) hypertension: Secondary | ICD-10-CM | POA: Diagnosis not present

## 2014-08-12 DIAGNOSIS — M8580 Other specified disorders of bone density and structure, unspecified site: Secondary | ICD-10-CM | POA: Diagnosis not present

## 2014-08-12 DIAGNOSIS — E785 Hyperlipidemia, unspecified: Secondary | ICD-10-CM | POA: Diagnosis not present

## 2014-10-03 DIAGNOSIS — N3941 Urge incontinence: Secondary | ICD-10-CM | POA: Diagnosis not present

## 2014-10-03 DIAGNOSIS — N393 Stress incontinence (female) (male): Secondary | ICD-10-CM | POA: Diagnosis not present

## 2014-10-03 DIAGNOSIS — R339 Retention of urine, unspecified: Secondary | ICD-10-CM | POA: Diagnosis not present

## 2014-10-21 DIAGNOSIS — M9903 Segmental and somatic dysfunction of lumbar region: Secondary | ICD-10-CM | POA: Diagnosis not present

## 2014-10-21 DIAGNOSIS — M955 Acquired deformity of pelvis: Secondary | ICD-10-CM | POA: Diagnosis not present

## 2014-10-21 DIAGNOSIS — M9905 Segmental and somatic dysfunction of pelvic region: Secondary | ICD-10-CM | POA: Diagnosis not present

## 2014-10-21 DIAGNOSIS — M5416 Radiculopathy, lumbar region: Secondary | ICD-10-CM | POA: Diagnosis not present

## 2014-11-14 ENCOUNTER — Encounter: Payer: Self-pay | Admitting: *Deleted

## 2014-11-18 ENCOUNTER — Ambulatory Visit: Payer: PRIVATE HEALTH INSURANCE | Admitting: Interventional Cardiology

## 2014-11-18 ENCOUNTER — Encounter: Payer: Self-pay | Admitting: Interventional Cardiology

## 2014-11-18 ENCOUNTER — Ambulatory Visit (INDEPENDENT_AMBULATORY_CARE_PROVIDER_SITE_OTHER): Payer: Medicare Other | Admitting: Interventional Cardiology

## 2014-11-18 VITALS — BP 120/72 | HR 67 | Ht 64.0 in | Wt 189.0 lb

## 2014-11-18 DIAGNOSIS — E785 Hyperlipidemia, unspecified: Secondary | ICD-10-CM | POA: Insufficient documentation

## 2014-11-18 DIAGNOSIS — I10 Essential (primary) hypertension: Secondary | ICD-10-CM | POA: Diagnosis not present

## 2014-11-18 DIAGNOSIS — I2510 Atherosclerotic heart disease of native coronary artery without angina pectoris: Secondary | ICD-10-CM

## 2014-11-18 HISTORY — DX: Hyperlipidemia, unspecified: E78.5

## 2014-11-18 NOTE — Patient Instructions (Signed)
Your physician recommends that you continue on your current medications as directed. Please refer to the Current Medication list given to you today.  Your physician discussed the importance of regular exercise and recommended that you start or continue a regular exercise program for good health.  Your physician wants you to follow-up in: 1 year with Dr.Smith You will receive a reminder letter in the mail two months in advance. If you don't receive a letter, please call our office to schedule the follow-up appointment.  

## 2014-11-18 NOTE — Progress Notes (Signed)
Cardiology Office Note   Date:  11/18/2014   ID:  Angel [name], Angel [name] 02-Oct-1930, MRN [id number]  PCP:  Herbert Moors, FNP  Cardiologist:   Sinclair Grooms, MD   No chief complaint on file.     History of Present Illness: Angel [name] is a 81 y.o. female who presents for nonobstructive coronary disease, nonobstructive with low risk myocardial perfusion study in the past. Has history of hypertension and hyperlipidemia on therapy well managed. No symptoms of active coronary disease or limitations related to angina.    Past Medical History  Diagnosis Date  . HTN (hypertension)   . HLD (hyperlipidemia)   . CAD (coronary artery disease)     History reviewed. No pertinent past surgical history.   Current Outpatient Prescriptions  Medication Sig Dispense Refill  . amLODipine (NORVASC) 5 MG tablet TAKE 1 TABLET BY MOUTH DAILY 30 tablet 5  . busPIRone (BUSPAR) 15 MG tablet Take 15 mg by mouth as needed (anxienty).     [name] estrogens, conjugated, (PREMARIN) 0.625 MG tablet TAKE ONE TABLET EVERY OTHER DAY 30 tablet 1  . imipramine (TOFRANIL) 25 MG tablet Take 25 mg by mouth 2 (two) times daily.    [name] lisinopril (PRINIVIL,ZESTRIL) 40 MG tablet Take 80 mg by mouth daily.    [name] lisinopril (PRINIVIL,ZESTRIL) 40 MG tablet TAKE 1 TABLET BY MOUTH DAILY 90 tablet 1  . metoprolol succinate (TOPROL-XL) 50 MG 24 hr tablet TAKE 1 TABLET BY MOUTH DAILY 30 tablet 12  . simvastatin (ZOCOR) 40 MG tablet TAKE ONE TABLET BY MOUTH EVERY NIGHT AT BEDTIME 90 tablet 2   No current facility-administered medications for this visit.    Allergies:   Codeine; Levofloxacin; Prednisone; and Sulfa antibiotics    Social History:  The patient  reports that she has never smoked. She does not have any smokeless tobacco history on file. She reports that she drinks alcohol. She reports that she does not use illicit drugs.   Family History:  The patient's family history includes CVA in her mother. no other  significant cardiovascular concerns and the family history.   ROS:  Please see the history of present illness.   Otherwise, review of systems are positive for joint stiffness and soreness but otherwise without complaints.   All other systems are reviewed and negative.    PHYSICAL EXAM: VS:  BP 120/72 mmHg  Pulse 67  Ht 5\' 4"  (1.626 m)  Wt 189 lb (85.73 kg)  BMI 32.43 kg/m2 , BMI Body mass index is 32.43 kg/(m^2). GEN: Well nourished, well developed, in no acute distress HEENT: normal Neck: no JVD, carotid bruits, or masses Cardiac: RRR; no murmurs, rubs, or gallops,no edema  Respiratory:  clear to auscultation bilaterally, normal work of breathing GI: soft, nontender, nondistended, + BS MS: no deformity or atrophy Skin: warm and dry, no rash Neuro:  Strength and sensation are intact Psych: euthymic mood, full affect   EKG:  EKG is ordered today. The ekg ordered today demonstrates poor R-wave progression, sinus rhythm, no change compared to prior   Recent Labs: No results found for requested labs within last 365 days.    Lipid Panel    Component Value Date/Time   CHOL 213* 11/06/2013 1045   TRIG 270.0* 11/06/2013 1045   HDL 51.90 11/06/2013 1045   CHOLHDL 4 11/06/2013 1045   VLDL 54.0* 11/06/2013 1045   LDLCALC 107* 11/06/2013 1045      Wt Readings from Last 3 Encounters:  11/18/14 189 lb (85.73 kg)  11/06/13 187 lb 12.8 oz (85.186 kg)      Other studies Reviewed: Additional studies/ records that were reviewed today include: Unable to find prior records from her normal clinic.[name] Review of the above records demonstrates: My recollection is that she had coronary evaluation that included myocardial perfusion imaging, and echocardiography. She has never been documented to have coronary disease by angiography   ASSESSMENT AND PLAN:  Essential hypertension: Controlled  Coronary artery disease involving native coronary artery of native heart without angina pectoris:  No complaints suggestive of angina. I did give the patient a copy of her electrocardiogram since it has [facility] [place] of prior anterior infarction. Her EKGs is stable and unchanged over the time frame that I have followed her. The abnormal EKG is what has led to cardiac evaluation in the past, with her having a low risk myocardial perfusion study.  Hyperlipidemia: On therapy and followed by her primary care physician    Current medicines are reviewed at length with the patient today.  The patient does not have concerns regarding medicines.  The following changes have been made:  no change  Labs/ tests ordered today include:   Orders Placed This Encounter  Procedures  . EKG 12-Lead     Disposition:   FU with Linard Millers in 1 year   Signed, Sinclair Grooms, MD  11/18/2014 12:22 PM    [facility] [place], [place], [place]  [address] Phone: [phone]; Fax: [phone]

## 2014-11-25 DIAGNOSIS — M9905 Segmental and somatic dysfunction of pelvic region: Secondary | ICD-10-CM | POA: Diagnosis not present

## 2014-11-25 DIAGNOSIS — M955 Acquired deformity of pelvis: Secondary | ICD-10-CM | POA: Diagnosis not present

## 2014-11-25 DIAGNOSIS — M5416 Radiculopathy, lumbar region: Secondary | ICD-10-CM | POA: Diagnosis not present

## 2014-11-25 DIAGNOSIS — M9903 Segmental and somatic dysfunction of lumbar region: Secondary | ICD-10-CM | POA: Diagnosis not present

## 2014-11-28 ENCOUNTER — Telehealth: Payer: Self-pay

## 2014-11-28 NOTE — Telephone Encounter (Signed)
I don't do premarin.

## 2014-11-28 NOTE — Telephone Encounter (Signed)
Called the patient to let her know that we could not fill premarin

## 2014-12-02 ENCOUNTER — Encounter: Payer: Self-pay | Admitting: Podiatry

## 2014-12-02 ENCOUNTER — Ambulatory Visit (INDEPENDENT_AMBULATORY_CARE_PROVIDER_SITE_OTHER): Payer: Medicare Other | Admitting: Podiatry

## 2014-12-02 ENCOUNTER — Other Ambulatory Visit: Payer: Self-pay | Admitting: Interventional Cardiology

## 2014-12-02 VITALS — Ht 64.0 in | Wt 185.0 lb

## 2014-12-02 DIAGNOSIS — M205X9 Other deformities of toe(s) (acquired), unspecified foot: Secondary | ICD-10-CM | POA: Diagnosis not present

## 2014-12-02 DIAGNOSIS — I2510 Atherosclerotic heart disease of native coronary artery without angina pectoris: Secondary | ICD-10-CM | POA: Diagnosis not present

## 2014-12-02 DIAGNOSIS — L84 Corns and callosities: Secondary | ICD-10-CM

## 2014-12-02 NOTE — Progress Notes (Signed)
   Subjective:    Patient ID: Angel [name], female    DOB: Oct 28, 1930, 81 y.o.   MRN: [id number]  HPI 81 year old female presents the office today with complaints of a painful corn on the outside aspect of her left fifth toe. She states that she is intact to the area herself however it is become more painful recently. She denies any drainage or redness along the area. She states the areas painful particularly with shoe gear and pressure. No other complaints at this time.   Review of Systems  HENT: Positive for hearing loss.   Hematological: Bruises/bleeds easily.  All other systems reviewed and are negative.      Objective:   Physical Exam AAO x3, NAD DP/PT pulses palpable bilaterally, CRT less than 3 seconds Protective sensation intact with Simms Weinstein monofilament, vibratory sensation intact, Achilles tendon reflex intact There is a hyperkeratotic lesion on the dorsal lateral aspect of the right fifth toe with underlying adductovarus deformity. Upon debridement a lesion there is no underlying ulceration, drainage or other clinical signs of infection. There is HAV and hammertoe contractures present bilaterally. No areas of tenderness to bilateral lower extremities. MMT 5/5, ROM WNL.  No open lesions or pre-ulcerative lesions.  No overlying edema, erythema, increase in warmth to bilateral lower extremities.  No pain with calf compression, swelling, warmth, erythema bilaterally.       Assessment & Plan:  81 year old female right lateral fifth digit hyperkeratotic lesion -Treatment options discussed include alternatives, risks, complications -Lesions upper debrided 1 without complications lastly bleeding -Dispensed offloading pads and discussed shoegear modifications.  -Follow-up as needed. Call the office with any questions, concerns, change in symptoms.

## 2014-12-04 ENCOUNTER — Ambulatory Visit: Payer: Self-pay | Admitting: Podiatry

## 2014-12-09 DIAGNOSIS — N3946 Mixed incontinence: Secondary | ICD-10-CM | POA: Diagnosis not present

## 2014-12-09 DIAGNOSIS — I2510 Atherosclerotic heart disease of native coronary artery without angina pectoris: Secondary | ICD-10-CM | POA: Diagnosis not present

## 2014-12-09 DIAGNOSIS — E785 Hyperlipidemia, unspecified: Secondary | ICD-10-CM | POA: Diagnosis not present

## 2014-12-15 DIAGNOSIS — M955 Acquired deformity of pelvis: Secondary | ICD-10-CM | POA: Diagnosis not present

## 2014-12-15 DIAGNOSIS — M5416 Radiculopathy, lumbar region: Secondary | ICD-10-CM | POA: Diagnosis not present

## 2014-12-15 DIAGNOSIS — M9905 Segmental and somatic dysfunction of pelvic region: Secondary | ICD-10-CM | POA: Diagnosis not present

## 2014-12-15 DIAGNOSIS — M9903 Segmental and somatic dysfunction of lumbar region: Secondary | ICD-10-CM | POA: Diagnosis not present

## 2014-12-24 DIAGNOSIS — L247 Irritant contact dermatitis due to plants, except food: Secondary | ICD-10-CM | POA: Diagnosis not present

## 2015-01-19 DIAGNOSIS — M955 Acquired deformity of pelvis: Secondary | ICD-10-CM | POA: Diagnosis not present

## 2015-01-19 DIAGNOSIS — M9903 Segmental and somatic dysfunction of lumbar region: Secondary | ICD-10-CM | POA: Diagnosis not present

## 2015-01-19 DIAGNOSIS — M9905 Segmental and somatic dysfunction of pelvic region: Secondary | ICD-10-CM | POA: Diagnosis not present

## 2015-01-19 DIAGNOSIS — M5416 Radiculopathy, lumbar region: Secondary | ICD-10-CM | POA: Diagnosis not present

## 2015-01-26 DIAGNOSIS — M9905 Segmental and somatic dysfunction of pelvic region: Secondary | ICD-10-CM | POA: Diagnosis not present

## 2015-01-26 DIAGNOSIS — M5416 Radiculopathy, lumbar region: Secondary | ICD-10-CM | POA: Diagnosis not present

## 2015-01-26 DIAGNOSIS — M955 Acquired deformity of pelvis: Secondary | ICD-10-CM | POA: Diagnosis not present

## 2015-01-26 DIAGNOSIS — M9903 Segmental and somatic dysfunction of lumbar region: Secondary | ICD-10-CM | POA: Diagnosis not present

## 2015-02-06 DIAGNOSIS — N3941 Urge incontinence: Secondary | ICD-10-CM | POA: Diagnosis not present

## 2015-02-06 DIAGNOSIS — N393 Stress incontinence (female) (male): Secondary | ICD-10-CM | POA: Diagnosis not present

## 2015-02-06 DIAGNOSIS — R339 Retention of urine, unspecified: Secondary | ICD-10-CM | POA: Diagnosis not present

## 2015-03-02 DIAGNOSIS — M955 Acquired deformity of pelvis: Secondary | ICD-10-CM | POA: Diagnosis not present

## 2015-03-02 DIAGNOSIS — M9905 Segmental and somatic dysfunction of pelvic region: Secondary | ICD-10-CM | POA: Diagnosis not present

## 2015-03-02 DIAGNOSIS — M5416 Radiculopathy, lumbar region: Secondary | ICD-10-CM | POA: Diagnosis not present

## 2015-03-02 DIAGNOSIS — M9903 Segmental and somatic dysfunction of lumbar region: Secondary | ICD-10-CM | POA: Diagnosis not present

## 2015-03-24 ENCOUNTER — Other Ambulatory Visit: Payer: Self-pay

## 2015-03-24 DIAGNOSIS — Z79890 Hormone replacement therapy (postmenopausal): Secondary | ICD-10-CM

## 2015-03-24 MED ORDER — ESTROGENS CONJUGATED 0.625 MG PO TABS: ORAL_TABLET | ORAL | Status: DC

## 2015-03-24 NOTE — Telephone Encounter (Signed)
Refill request was sent to Dr. Ashany Sundaram for approval and submission.  

## 2015-04-02 ENCOUNTER — Encounter: Payer: Self-pay | Admitting: Family Medicine

## 2015-04-02 ENCOUNTER — Ambulatory Visit (INDEPENDENT_AMBULATORY_CARE_PROVIDER_SITE_OTHER): Payer: Medicare Other | Admitting: Family Medicine

## 2015-04-02 ENCOUNTER — Other Ambulatory Visit: Payer: Self-pay | Admitting: Family Medicine

## 2015-04-02 VITALS — BP 128/76 | HR 99 | Temp 97.5°F | Resp 16 | Wt 187.5 lb

## 2015-04-02 DIAGNOSIS — G8929 Other chronic pain: Secondary | ICD-10-CM | POA: Insufficient documentation

## 2015-04-02 DIAGNOSIS — M25559 Pain in unspecified hip: Principal | ICD-10-CM

## 2015-04-02 DIAGNOSIS — I719 Aortic aneurysm of unspecified site, without rupture: Secondary | ICD-10-CM | POA: Insufficient documentation

## 2015-04-02 DIAGNOSIS — N3946 Mixed incontinence: Secondary | ICD-10-CM

## 2015-04-02 DIAGNOSIS — L309 Dermatitis, unspecified: Secondary | ICD-10-CM | POA: Diagnosis not present

## 2015-04-02 DIAGNOSIS — M8580 Other specified disorders of bone density and structure, unspecified site: Secondary | ICD-10-CM | POA: Insufficient documentation

## 2015-04-02 DIAGNOSIS — I2510 Atherosclerotic heart disease of native coronary artery without angina pectoris: Secondary | ICD-10-CM

## 2015-04-02 DIAGNOSIS — H9193 Unspecified hearing loss, bilateral: Secondary | ICD-10-CM | POA: Insufficient documentation

## 2015-04-02 DIAGNOSIS — F418 Other specified anxiety disorders: Secondary | ICD-10-CM

## 2015-04-02 DIAGNOSIS — Z79890 Hormone replacement therapy (postmenopausal): Secondary | ICD-10-CM

## 2015-04-02 DIAGNOSIS — E785 Hyperlipidemia, unspecified: Secondary | ICD-10-CM | POA: Insufficient documentation

## 2015-04-02 MED ORDER — MOMETASONE FUROATE 0.1 % EX CREA: 1.0000 "application " | TOPICAL_CREAM | Freq: Every day | CUTANEOUS | Status: DC

## 2015-04-02 NOTE — Patient Instructions (Signed)

## 2015-04-02 NOTE — Progress Notes (Signed)
Name: Angel [name]   MRN: [id number]    DOB: Dec 27, 1930   Date:04/02/2015       Progress Note  Subjective  Chief Complaint  Chief Complaint  Patient presents with  . Cellulitis    Patient questions if she has a infection on her right ankle that has persisted for about a year now. Patient has tried otc hyrdocortisone cream and neosporin. Patient states that sometimes it itches and sometimes it burns.    HPI  Rash: Patient complains of rash involving the right ankle. Rash started 8 months ago. Appearance of rash at onset: Color of lesion(s): brown and pink, Texture of lesion(s): scaly dry. Rash has not changed over time Initial distribution: right ankle.  Discomfort associated with rash: is painful.  Associated symptoms: burning sensation. Denies: abdominal pain, arthralgia, congestion, decrease in appetite, decrease in energy level, fever, headache, irritability, myalgia, nausea and sore throat. Patient has not had previous evaluation of rash. Patient has had previous treatment, hydrocortisone cream and neosporin.  Response to treatment: minimal. Patient has not had contacts with similar rash. Patient has not identified precipitant. Patient has not had new exposures soaps, lotions, laundry detergents, foods, medications, plants, insects or animals.   Patient Active Problem List   Diagnosis Date Noted  . Hip pain, chronic 04/02/2015  . Hearing loss of both ears 04/02/2015  . Osteopenia of the elderly 04/02/2015  . Hormone replacement therapy (postmenopausal) 04/02/2015  . Aortic aneurysm without rupture 04/02/2015  . Situational anxiety 04/02/2015  . Hyperlipidemia LDL goal <100 11/18/2014  . Incomplete bladder emptying 06/29/2014  . Mixed stress and urge urinary incontinence 06/29/2014  . Hypertension goal BP (blood pressure) < 140/90 11/06/2013  . CAD (coronary artery disease) 11/06/2013    Social History  Substance Use Topics  . Smoking status: Never Smoker   . Smokeless  tobacco: Never Used  . Alcohol Use: 0.0 oz/week    0 Standard drinks or equivalent per week     Comment: occasional     Current outpatient prescriptions:  .  busPIRone (BUSPAR) 15 MG tablet, Take 7.5 mg by mouth 2 (two) times daily., Disp: , Rfl:  .  simvastatin (ZOCOR) 80 MG tablet, Take 1 tablet by mouth at bedtime., Disp: , Rfl:  .  amLODipine (NORVASC) 5 MG tablet, TAKE 1 TABLET BY MOUTH DAILY, Disp: 30 tablet, Rfl: 11 .  estrogens, conjugated, (PREMARIN) 0.625 MG tablet, TAKE ONE TABLET EVERY OTHER DAY, Disp: 45 tablet, Rfl: 1 .  imipramine (TOFRANIL) 25 MG tablet, Take 25 mg by mouth 2 (two) times daily., Disp: , Rfl:  .  lisinopril (PRINIVIL,ZESTRIL) 40 MG tablet, Take 80 mg by mouth daily., Disp: , Rfl:  .  metoprolol succinate (TOPROL-XL) 50 MG 24 hr tablet, TAKE 1 TABLET BY MOUTH DAILY, Disp: 30 tablet, Rfl: 12  No past surgical history on file.  Family History  Problem Relation Age of Onset  . CVA Mother     Allergies  Allergen Reactions  . Codeine Other (See Comments)    "zoned out"  . Levofloxacin Other (See Comments)    AMS  . Prednisone Other (See Comments)    AMS  . Sulfa Antibiotics Other (See Comments)    AMS     Review of Systems  CONSTITUTIONAL: No significant weight changes, fever, chills, weakness or fatigue.  HEENT:  - Eyes: No visual changes.  - Ears: No auditory changes (baseline hearing difficulty). No pain.  - Nose: No sneezing, congestion, runny nose. - Throat:  No sore throat. No changes in swallowing. SKIN: Yes Rash  CARDIOVASCULAR: No chest pain, chest pressure or chest discomfort. No palpitations or edema.  RESPIRATORY: No shortness of breath, cough or sputum.  GASTROINTESTINAL: No anorexia, nausea, vomiting. No changes in bowel habits. No abdominal pain or blood.  GENITOURINARY: No dysuria. No frequency. No discharge.  NEUROLOGICAL: No headache, dizziness, syncope, paralysis, ataxia, numbness or tingling in the extremities. No memory  changes. No change in bowel or bladder control.  MUSCULOSKELETAL: Yes joint pain. No muscle pain. HEMATOLOGIC: No anemia. Yes easy bruising.  LYMPHATICS: No enlarged lymph nodes.  PSYCHIATRIC: No change in mood. No change in sleep pattern.  ENDOCRINOLOGIC: No reports of sweating, cold or heat intolerance. No polyuria or polydipsia.     Objective  BP 128/76 mmHg  Pulse 99  Temp(Src) 97.5 F (36.4 C) (Oral)  Resp 16  Wt 187 lb 8 oz (85.049 kg)  SpO2 94%  LMP  (LMP Unknown) Body mass index is 32.17 kg/(m^2).  Physical Exam  Constitutional: Patient appears well-developed and well-nourished. In no distress.  Neck: Normal range of motion. Neck supple. No JVD present. No thyromegaly present.  Cardiovascular: Normal rate, regular rhythm and normal heart sounds.  No murmur heard.  Pulmonary/Chest: Effort normal and breath sounds normal. No respiratory distress. Musculoskeletal: Normal range of motion bilateral UE and LE, no joint effusions. Peripheral vascular: Bilateral LE trace pedal and ankle edema. Neurological: CN II-XII grossly intact with no focal deficits. Alert and oriented to person, place, and time. Coordination, balance, strength, speech and gait are normal.  Skin: Skin is warm and dry. Right ankle above lateral malleolus is a 3 cm x 2 cm area of red, dry, flaky, slightly raised area without warmth or tenderness, well demarcated border. Psychiatric: Patient has a normal mood and affect. Behavior is normal in office today. Judgment and thought content normal in office today.   Assessment & Plan  1. Dermatitis Eczema dermatitis, localized patch with no superimposed infection. Alternate home bio-oil/vitEoil/coconut-oil with stronger steroid cream.  - mometasone (ELOCON) 0.1 % cream; Apply 1 application topically daily.  Dispense: 45 g; Refill: 0

## 2015-04-07 ENCOUNTER — Telehealth: Payer: Self-pay | Admitting: Family Medicine

## 2015-04-07 DIAGNOSIS — M955 Acquired deformity of pelvis: Secondary | ICD-10-CM | POA: Diagnosis not present

## 2015-04-07 DIAGNOSIS — M9905 Segmental and somatic dysfunction of pelvic region: Secondary | ICD-10-CM | POA: Diagnosis not present

## 2015-04-07 DIAGNOSIS — M5416 Radiculopathy, lumbar region: Secondary | ICD-10-CM | POA: Diagnosis not present

## 2015-04-07 DIAGNOSIS — M9903 Segmental and somatic dysfunction of lumbar region: Secondary | ICD-10-CM | POA: Diagnosis not present

## 2015-04-07 NOTE — Telephone Encounter (Signed)
Needing additional diagnosis codes for patient's labs (CBC, CMP, TSH, Lipid, & Vit. D) that was done in Jan. 2016 at [facility].  These test were ordered on her AWV that occurred on 07-16-2015.  The diagnosis codes that were used were I10.0 (Hypertension), E78.5 (Hyperlipidemia), & M85.80 (Osteopenia).  Please let me know if any additional codes can be used and I will call and give to [facility] to reprocess.

## 2015-04-07 NOTE — Telephone Encounter (Signed)
According to the billing documents she left with me (will bring to you) it looks like they are not covering her Vit D lab order from 08/12/14 which was done before we went live to [facility] and is a hard stop for that order on medicare patients. There are no more diagnoses I can use unfortunately. She has already paid the bill and I am not confident that much more can be done. I have explained that to her in person as well.

## 2015-04-14 ENCOUNTER — Ambulatory Visit (INDEPENDENT_AMBULATORY_CARE_PROVIDER_SITE_OTHER): Payer: Medicare Other | Admitting: Family Medicine

## 2015-04-14 ENCOUNTER — Encounter: Payer: Self-pay | Admitting: Family Medicine

## 2015-04-14 VITALS — BP 118/64 | HR 98 | Temp 97.9°F | Resp 16 | Wt 184.1 lb

## 2015-04-14 DIAGNOSIS — I2510 Atherosclerotic heart disease of native coronary artery without angina pectoris: Secondary | ICD-10-CM

## 2015-04-14 DIAGNOSIS — L309 Dermatitis, unspecified: Secondary | ICD-10-CM | POA: Diagnosis not present

## 2015-04-14 MED ORDER — DEXAMETHASONE SODIUM PHOSPHATE 100 MG/10ML IJ SOLN
20.0000 mg | Freq: Once | INTRAMUSCULAR | Status: AC
  Administered 2015-04-14: 20 mg via INTRAMUSCULAR

## 2015-04-14 NOTE — Progress Notes (Signed)
Name: Angel [name]   MRN: [id number]    DOB: 1931/05/18   Date:04/14/2015       Progress Note  Subjective  Chief Complaint  Chief Complaint  Patient presents with  . Dermatitis    patient took a hike in the woods and broke out with poison ivey. patient first noticed it on Sunday. patient has tried Ivaris and Zanphil.    HPI  Rash: Patient complains of rash starting in there bilateral LE after hiking in the woods near her home. Rash started few days ago. Appearance of rash at onset: Color of lesion(s): brown and pink, Texture of lesion(s): raised, vesicular with patches of scaly dry. Rash has changed over time Initial distribution: all over trunk now. Discomfort associated with rash: is pruritic. Associated symptoms: burning sensation. Denies: abdominal pain, arthralgia, congestion, decrease in appetite, decrease in energy level, fever, headache, irritability, myalgia, nausea and sore throat. Patient has had previous evaluation of rash. Patient has had previous treatment, dexamethasone 30 mg IM, hydrocortisone cream and neosporin. Response to treatment: good. Patient has not had contacts with similar rash. Patient has identified plant based precipitant. Patient has not had new exposures soaps, lotions, laundry detergents, foods, medications, plants, insects or animals.   Patient Active Problem List   Diagnosis Date Noted  . Hip pain, chronic 04/02/2015  . Hearing loss of both ears 04/02/2015  . Osteopenia of the elderly 04/02/2015  . Hormone replacement therapy (postmenopausal) 04/02/2015  . Aortic aneurysm without rupture 04/02/2015  . Situational anxiety 04/02/2015  . Dermatitis 04/02/2015  . Hyperlipidemia LDL goal <100 11/18/2014  . Incomplete bladder emptying 06/29/2014  . Mixed stress and urge urinary incontinence 06/29/2014  . Hypertension goal BP (blood pressure) < 140/90 11/06/2013  . CAD (coronary artery disease) 11/06/2013    Social History  Substance Use Topics  .  Smoking status: Never Smoker   . Smokeless tobacco: Never Used  . Alcohol Use: 0.0 oz/week    0 Standard drinks or equivalent per week     Comment: occasional     Current outpatient prescriptions:  .  amLODipine (NORVASC) 5 MG tablet, TAKE 1 TABLET BY MOUTH DAILY, Disp: 30 tablet, Rfl: 11 .  busPIRone (BUSPAR) 15 MG tablet, Take 7.5 mg by mouth 2 (two) times daily., Disp: , Rfl:  .  estrogens, conjugated, (PREMARIN) 0.625 MG tablet, TAKE ONE TABLET EVERY OTHER DAY, Disp: 45 tablet, Rfl: 1 .  imipramine (TOFRANIL) 25 MG tablet, Take 25 mg by mouth 2 (two) times daily., Disp: , Rfl:  .  lisinopril (PRINIVIL,ZESTRIL) 40 MG tablet, Take 80 mg by mouth daily., Disp: , Rfl:  .  metoprolol succinate (TOPROL-XL) 50 MG 24 hr tablet, TAKE 1 TABLET BY MOUTH DAILY, Disp: 30 tablet, Rfl: 12 .  mometasone (ELOCON) 0.1 % cream, Apply 1 application topically daily., Disp: 45 g, Rfl: 0 .  simvastatin (ZOCOR) 40 MG tablet, Take 40 mg by mouth daily., Disp: , Rfl:   Allergies  Allergen Reactions  . Codeine Other (See Comments)    "zoned out"  . Levofloxacin Other (See Comments)    AMS  . Prednisone Other (See Comments)    AMS  . Sulfa Antibiotics Other (See Comments)    AMS    Review of Systems  CONSTITUTIONAL: No significant weight changes, fever, chills, weakness or fatigue.  HEENT:  - Eyes: No visual changes.  - Ears: No auditory changes (baseline hearing difficulty). No pain.  - Nose: No sneezing, congestion, runny nose. - Throat:  No sore throat. No changes in swallowing. SKIN: Yes Rash  CARDIOVASCULAR: No chest pain, chest pressure or chest discomfort. No palpitations or edema.  RESPIRATORY: No shortness of breath, cough or sputum.  GASTROINTESTINAL: No anorexia, nausea, vomiting. No changes in bowel habits. No abdominal pain or blood.  GENITOURINARY: No dysuria. No frequency. No discharge.  NEUROLOGICAL: No headache, dizziness, syncope, paralysis, ataxia, numbness or tingling in  the extremities. No memory changes. No change in bowel or bladder control.  MUSCULOSKELETAL: Yes joint pain. No muscle pain. HEMATOLOGIC: No anemia. Yes easy bruising.  LYMPHATICS: No enlarged lymph nodes.  PSYCHIATRIC: No change in mood. No change in sleep pattern.  ENDOCRINOLOGIC: No reports of sweating, cold or heat intolerance. No polyuria or polydipsia.   Objective  BP 118/64 mmHg  Pulse 98  Temp(Src) 97.9 F (36.6 C) (Oral)  Resp 16  Wt 184 lb 1.6 oz (83.507 kg)  SpO2 98%  LMP  (LMP Unknown)  Body mass index is 31.59 kg/(m^2).   Physical Exam  Constitutional: Patient appears well-developed and well-nourished. In no distress.  Neck: Normal range of motion. Neck supple. No JVD present. No thyromegaly present.  Cardiovascular: Normal rate, regular rhythm and normal heart sounds. No murmur heard.  Pulmonary/Chest: Effort normal and breath sounds normal. No respiratory distress. Musculoskeletal: Normal range of motion bilateral UE and LE, no joint effusions. Peripheral vascular: Bilateral LE trace pedal and ankle edema. Neurological: CN II-XII grossly intact with no focal deficits. Alert and oriented to person, place, and time. Coordination, balance, strength, speech and gait are normal.  Skin: Skin is warm and dry. Right ankle above lateral malleolus is a 3 cm x 2 cm area of red, dry, flaky, slightly raised area without warmth or tenderness, well demarcated border. Scattered raised red lesions on LE, abdomen, back and upper arms. Psychiatric: Patient has a normal mood and affect. Behavior is normal in office today. Judgment and thought content normal in office today.   Assessment & Plan  1. Dermatitis Propensity for eczema dermatitis and allergic reactions.  Will administer dexamethasone 20 mg IM x1 today with home topical care. If symptoms do not start resolving in 2-3 days she may use oral dexamethasone tablets she has at home.  - dexamethasone (DECADRON) injection 20  mg; Inject 2 mLs (20 mg total) into the muscle once.

## 2015-04-14 NOTE — Patient Instructions (Signed)
Poison Oak Poison oak is an inflammation of the skin (contact dermatitis). It is caused by contact with the allergens on the leaves of the oak (toxicodendron) plants. Depending on your sensitivity, the rash may consist simply of redness and itching, or it may also progress to blisters which may break open (rupture). These must be well cared for to prevent secondary germ (bacterial) infection as these infections can lead to scarring. The eyes may also get puffy. The puffiness is worst in the morning and gets better as the day progresses. Healing is best accomplished by keeping any open areas dry, clean, covered with a bandage, and covered with an antibacterial ointment if needed. Without secondary infection, this dermatitis usually heals without scarring within 2 to 3 weeks without treatment. HOME CARE INSTRUCTIONS When you have been exposed to poison oak, it is very important to thoroughly wash with soap and water as soon as the exposure has been discovered. You have about one half hour to remove the plant resin before it will cause the rash. This cleaning will quickly destroy the oil or antigen on the skin (the antigen is what causes the rash). Wash aggressively under the fingernails as any plant resin still there will continue to spread the rash. Do not rub skin vigorously when washing affected area. Poison oak cannot spread if no oil from the plant remains on your body. Rash that has progressed to weeping sores (lesions) will not spread the rash unless you have not washed thoroughly. It is also important to clean any clothes you have been wearing as they may carry active allergens which will spread the rash, even several days later. Avoidance of the plant in the future is the best measure. Poison oak plants can be recognized by the number of leaves. Generally, poison oak has three leaves with flowering branches on a single stem. Diphenhydramine may be purchased over the counter and used as needed for  itching. Do not drive with this medication if it makes you drowsy. Ask your caregiver about medication for children. SEEK IMMEDIATE MEDICAL CARE IF:   Open areas of the rash develop.  You notice redness extending beyond the area of the rash.  There is a pus like discharge.  There is increased pain.  Other signs of infection develop (such as fever). Document Released: 01/29/2003 Document Revised: 10/17/2011 Document Reviewed: 06/10/2009 ExitCare Patient Information 2015 [employer]. This information is not intended to replace advice given to you by your health care provider. Make sure you discuss any questions you have with your health care provider.  

## 2015-05-05 DIAGNOSIS — M9903 Segmental and somatic dysfunction of lumbar region: Secondary | ICD-10-CM | POA: Diagnosis not present

## 2015-05-05 DIAGNOSIS — M955 Acquired deformity of pelvis: Secondary | ICD-10-CM | POA: Diagnosis not present

## 2015-05-05 DIAGNOSIS — M5416 Radiculopathy, lumbar region: Secondary | ICD-10-CM | POA: Diagnosis not present

## 2015-05-05 DIAGNOSIS — M9905 Segmental and somatic dysfunction of pelvic region: Secondary | ICD-10-CM | POA: Diagnosis not present

## 2015-05-14 ENCOUNTER — Telehealth: Payer: Self-pay | Admitting: Family Medicine

## 2015-05-14 NOTE — Telephone Encounter (Signed)
Pt would like to know if its ok for her to get the Shingles Vaccine?

## 2015-05-19 NOTE — Telephone Encounter (Signed)
Pt informed

## 2015-05-19 NOTE — Telephone Encounter (Signed)
Please let patient know it is okay for her to get Shingles Vaccine, there may be a co pay from her insurance so I encourage her to call her insurance company and inquire about any fees associated with the vaccination. She can get the vaccine here or at a pharmacy.

## 2015-06-01 ENCOUNTER — Other Ambulatory Visit: Payer: Self-pay

## 2015-06-01 MED ORDER — LISINOPRIL 40 MG PO TABS: 80.0000 mg | ORAL_TABLET | Freq: Every day | ORAL | Status: DC

## 2015-06-01 NOTE — Telephone Encounter (Signed)
Got a fax from [facility] requesting a refill on this patient's Lisnopril 40mg .  Refill request was sent to Dr. Bobetta Lime for approval and submission.

## 2015-06-02 DIAGNOSIS — M955 Acquired deformity of pelvis: Secondary | ICD-10-CM | POA: Diagnosis not present

## 2015-06-02 DIAGNOSIS — M9903 Segmental and somatic dysfunction of lumbar region: Secondary | ICD-10-CM | POA: Diagnosis not present

## 2015-06-02 DIAGNOSIS — M5416 Radiculopathy, lumbar region: Secondary | ICD-10-CM | POA: Diagnosis not present

## 2015-06-02 DIAGNOSIS — M9905 Segmental and somatic dysfunction of pelvic region: Secondary | ICD-10-CM | POA: Diagnosis not present

## 2015-06-04 DIAGNOSIS — Z23 Encounter for immunization: Secondary | ICD-10-CM | POA: Diagnosis not present

## 2015-06-29 DIAGNOSIS — M955 Acquired deformity of pelvis: Secondary | ICD-10-CM | POA: Diagnosis not present

## 2015-06-29 DIAGNOSIS — M9905 Segmental and somatic dysfunction of pelvic region: Secondary | ICD-10-CM | POA: Diagnosis not present

## 2015-06-29 DIAGNOSIS — M5416 Radiculopathy, lumbar region: Secondary | ICD-10-CM | POA: Diagnosis not present

## 2015-06-29 DIAGNOSIS — M9903 Segmental and somatic dysfunction of lumbar region: Secondary | ICD-10-CM | POA: Diagnosis not present

## 2015-07-10 ENCOUNTER — Other Ambulatory Visit: Payer: Self-pay

## 2015-07-10 MED ORDER — METOPROLOL SUCCINATE ER 50 MG PO TB24: 50.0000 mg | ORAL_TABLET | Freq: Every day | ORAL | Status: DC

## 2015-07-10 NOTE — Telephone Encounter (Signed)
Got a fax from [facility] requesting a refill of this patient's Metoprolol 50mg  #30.  Refill request was sent to Dr. Bobetta Lime for approval and submission.

## 2015-07-27 DIAGNOSIS — M9903 Segmental and somatic dysfunction of lumbar region: Secondary | ICD-10-CM | POA: Diagnosis not present

## 2015-07-27 DIAGNOSIS — M955 Acquired deformity of pelvis: Secondary | ICD-10-CM | POA: Diagnosis not present

## 2015-07-27 DIAGNOSIS — M9905 Segmental and somatic dysfunction of pelvic region: Secondary | ICD-10-CM | POA: Diagnosis not present

## 2015-07-27 DIAGNOSIS — M5416 Radiculopathy, lumbar region: Secondary | ICD-10-CM | POA: Diagnosis not present

## 2015-07-28 ENCOUNTER — Other Ambulatory Visit: Payer: Self-pay | Admitting: Family Medicine

## 2015-07-28 DIAGNOSIS — Z1231 Encounter for screening mammogram for malignant neoplasm of breast: Secondary | ICD-10-CM

## 2015-08-04 ENCOUNTER — Other Ambulatory Visit: Payer: Self-pay | Admitting: Family Medicine

## 2015-08-04 ENCOUNTER — Ambulatory Visit (INDEPENDENT_AMBULATORY_CARE_PROVIDER_SITE_OTHER): Payer: Medicare Other | Admitting: Family Medicine

## 2015-08-04 ENCOUNTER — Encounter: Payer: Self-pay | Admitting: Family Medicine

## 2015-08-04 ENCOUNTER — Ambulatory Visit
Admission: RE | Admit: 2015-08-04 | Discharge: 2015-08-04 | Disposition: A | Discharge status: Home / Self Care | Payer: Medicare Other | Source: Ambulatory Visit | Attending: Family Medicine | Admitting: Family Medicine

## 2015-08-04 VITALS — BP 108/70 | HR 86 | Temp 98.0°F | Resp 12 | Wt 182.9 lb

## 2015-08-04 DIAGNOSIS — Z1231 Encounter for screening mammogram for malignant neoplasm of breast: Secondary | ICD-10-CM | POA: Diagnosis not present

## 2015-08-04 DIAGNOSIS — I2510 Atherosclerotic heart disease of native coronary artery without angina pectoris: Secondary | ICD-10-CM

## 2015-08-04 DIAGNOSIS — M205X2 Other deformities of toe(s) (acquired), left foot: Secondary | ICD-10-CM

## 2015-08-04 DIAGNOSIS — L989 Disorder of the skin and subcutaneous tissue, unspecified: Secondary | ICD-10-CM | POA: Insufficient documentation

## 2015-08-04 DIAGNOSIS — M205X9 Other deformities of toe(s) (acquired), unspecified foot: Secondary | ICD-10-CM | POA: Insufficient documentation

## 2015-08-04 NOTE — Progress Notes (Signed)
Name: Angel [name]   MRN: [id number]    DOB: 1931/02/28   Date:08/04/2015       Progress Note  Subjective  Chief Complaint  Chief Complaint  Patient presents with  . Referral    dermatologist  . Toe Pain    toes are crossing over each other.    HPI  Angel [name] is a 81 year old female who is here to request referrals to Dermatology and Podiatry, has previously seen Dr. Celesta Gentile at [facility] on 12/02/14 for a corn on her left lateral foot but since then her 2nd toe has started overlaping her 1st toe. She has been taping both toes to prevent abrasion. Only painful with long walks in the wrong shoes. She has broken her left ankle x2 and her right angle x1 she reports. Otherwise needs Dermatology referral to   [facility]: Oneta Rack MD ?   for general skin surveillance.    Past Medical History  Diagnosis Date  . HTN (hypertension)   . HLD (hyperlipidemia)   . CAD (coronary artery disease)   . Hyperlipidemia LDL goal <130   . HTN, goal below 150/90   . Hearing loss   . Anxiety   . CAD in native artery   . Endometriosis   . Urge and stress incontinence   . Osteopenia of the elderly   . Chronic hip pain   . Aortic aneurysm without rupture [facility])     Patient Active Problem List   Diagnosis Date Noted  . Hip pain, chronic 04/02/2015  . Hearing loss of both ears 04/02/2015  . Osteopenia of the elderly 04/02/2015  . Hormone replacement therapy (postmenopausal) 04/02/2015  . Aortic aneurysm without rupture ([place]) 04/02/2015  . Situational anxiety 04/02/2015  . Dermatitis 04/02/2015  . Hyperlipidemia LDL goal <100 11/18/2014  . Incomplete bladder emptying 06/29/2014  . Mixed stress and urge urinary incontinence 06/29/2014  . Urge incontinence of urine 06/29/2014  . Hypertension goal BP (blood pressure) < 140/90 11/06/2013  . CAD (coronary artery disease) 11/06/2013    Social History  Substance Use Topics  . Smoking status: Never Smoker    . Smokeless tobacco: Never Used  . Alcohol Use: 0.0 oz/week    0 Standard drinks or equivalent per week     Comment: occasional     Current outpatient prescriptions:  .  amLODipine (NORVASC) 5 MG tablet, , Disp: , Rfl:  .  estrogens, conjugated, (PREMARIN) 0.625 MG tablet, TAKE ONE TABLET EVERY OTHER DAY, Disp: 45 tablet, Rfl: 1 .  imipramine (TOFRANIL) 25 MG tablet, Take 25 mg by mouth 2 (two) times daily., Disp: , Rfl:  .  lisinopril (PRINIVIL,ZESTRIL) 40 MG tablet, Take 2 tablets (80 mg total) by mouth daily., Disp: 180 tablet, Rfl: 3 .  metoprolol succinate (TOPROL-XL) 50 MG 24 hr tablet, Take 1 tablet (50 mg total) by mouth daily. Take with or immediately following a meal., Disp: 90 tablet, Rfl: 2 .  mometasone (ELOCON) 0.1 % cream, Apply 1 application topically daily., Disp: 45 g, Rfl: 0 .  simvastatin (ZOCOR) 80 MG tablet, , Disp: , Rfl:   Past Surgical History  Procedure Laterality Date  . Appendectomy    . Abdominal hysterectomy  1976    total    Family History  Problem Relation Age of Onset  . CVA Mother   . Cancer Maternal Grandmother     breast    Allergies  Allergen Reactions  . Codeine Other (  See Comments)    "zoned out"  . Levofloxacin Other (See Comments)    AMS  . Prednisone Other (See Comments)    AMS  . Sulfa Antibiotics Other (See Comments)    AMS     Review of Systems  CONSTITUTIONAL: No significant weight changes, fever, chills, weakness or fatigue.  SKIN: No rash or itching.  CARDIOVASCULAR: No chest pain, chest pressure or chest discomfort. No palpitations or edema.  RESPIRATORY: No shortness of breath, cough or sputum.  MUSCULOSKELETAL: No joint pain. No muscle pain.  PSYCHIATRIC: No change in mood. No change in sleep pattern.   Objective  BP 108/70 mmHg  Pulse 86  Temp(Src) 98 F (36.7 C) (Oral)  Resp 12  Wt 182 lb 14.4 oz (82.963 kg)  SpO2 98%  LMP  (LMP Unknown) Body mass index is 31.38 kg/(m^2).  Physical  Exam  Constitutional: Patient appears well-developed and well-nourished. In no distress.  Cardiovascular: Normal rate, regular rhythm and normal heart sounds.  No murmur heard.  Pulmonary/Chest: Effort normal and breath sounds normal. No respiratory distress. Musculoskeletal: Normal range of motion bilateral UE and LE, no joint effusions. Left foot 2nd digit overlaps great toe on the top with no skin break down.  Peripheral vascular: Bilateral LE no edema. Skin: Skin is warm and dry. Scattered sun spots, hyperpigmentation and actinic keratosis.  Psychiatric: Patient has a normal mood and affect. Behavior is normal in office today. Judgment and thought content normal in office today.  Assessment & Plan  1. Skin lesion Referral placed  - Ambulatory referral to Dermatology  2. Crossover toe, left Referral placed, continue taping until then.   - Ambulatory referral to Podiatry

## 2015-09-01 DIAGNOSIS — M955 Acquired deformity of pelvis: Secondary | ICD-10-CM | POA: Diagnosis not present

## 2015-09-01 DIAGNOSIS — M9905 Segmental and somatic dysfunction of pelvic region: Secondary | ICD-10-CM | POA: Diagnosis not present

## 2015-09-01 DIAGNOSIS — M9903 Segmental and somatic dysfunction of lumbar region: Secondary | ICD-10-CM | POA: Diagnosis not present

## 2015-09-01 DIAGNOSIS — M5416 Radiculopathy, lumbar region: Secondary | ICD-10-CM | POA: Diagnosis not present

## 2015-09-08 ENCOUNTER — Ambulatory Visit: Payer: Medicare Other | Admitting: Podiatry

## 2015-09-10 ENCOUNTER — Encounter: Payer: Self-pay | Admitting: Podiatry

## 2015-09-10 ENCOUNTER — Ambulatory Visit (INDEPENDENT_AMBULATORY_CARE_PROVIDER_SITE_OTHER): Payer: Medicare Other

## 2015-09-10 ENCOUNTER — Ambulatory Visit (INDEPENDENT_AMBULATORY_CARE_PROVIDER_SITE_OTHER): Payer: Medicare Other | Admitting: Podiatry

## 2015-09-10 DIAGNOSIS — M205X2 Other deformities of toe(s) (acquired), left foot: Secondary | ICD-10-CM

## 2015-09-10 DIAGNOSIS — R52 Pain, unspecified: Secondary | ICD-10-CM

## 2015-09-10 DIAGNOSIS — M205X9 Other deformities of toe(s) (acquired), unspecified foot: Secondary | ICD-10-CM | POA: Diagnosis not present

## 2015-09-10 DIAGNOSIS — L84 Corns and callosities: Secondary | ICD-10-CM

## 2015-09-10 NOTE — Progress Notes (Signed)
Patient ID: Angel [name], female   DOB: [birth year], 82 y.o.   MRN: [id number]  Subjective: 82 year old female presents the office they for recurrence of a corn on the lateral aspect of the left fifth toe. She also states that her left second toe is becoming more of a nuisance as is overlapping the big toe which is causing problems in her shoes. She denies any pain to the area. She tries to tape her toe down however does not help. Denies any recent injury or trauma. No swelling or redness. Denies any systemic complaints such as fevers, chills, nausea, vomiting. No acute changes since last appointment, and no other complaints at this time.   Objective: AAO x3, NAD DP/PT pulses palpable bilaterally, CRT less than 3 seconds Protective sensation intact with Simms Weinstein monofilament There is significant HAV deformity present bilaterally left worse in the right second toe is overlapping the hallux and is chronically subluxed at the second MPJ. I am able to reduce the second toe back into a rectus position however does not stay in place given the bunion. No tenderness to palpation to the area and there is no edema, erythema, increase in warmth. My hyperkeratotic lesion over on the left fifth toe laterally. Upon debridement there is no underlying ulceration, drainage or other signs of infection.  No areas of pinpoint bony tenderness or pain with vibratory sensation. MMT 5/5, ROM WNL. No edema, erythema, increase in warmth to bilateral lower extremities.  No other  open lesions or pre-ulcerative lesions.  No pain with calf compression, swelling, warmth, erythema  Assessment: Crossover toe deformity, left fifth toe hyperkeratotic lesion  Plan: -All treatment options discussed with the patient including all alternatives, risks, complications. Discussed both conservative and surgical treatment options. -X-rays were obtained and reviewed with the patient. HAV and hammertoes are present. No acute  fracture. -Etiology of symptoms were discussed -Toe crest was dispensed to help hold the toe in a rectus position. Discussed shoe gear changes. -Hyperkeratotic lesion was debrided without complications or bleeding. -Patient encouraged to call the office with any questions, concerns, change in symptoms.  -Follow-up as needed.  Celesta Gentile, DPM

## 2015-09-17 ENCOUNTER — Other Ambulatory Visit: Payer: Self-pay

## 2015-09-17 DIAGNOSIS — Z79890 Hormone replacement therapy (postmenopausal): Secondary | ICD-10-CM

## 2015-09-18 MED ORDER — ESTROGENS CONJUGATED 0.625 MG PO TABS: ORAL_TABLET | ORAL | Status: DC

## 2015-10-02 DIAGNOSIS — D485 Neoplasm of uncertain behavior of skin: Secondary | ICD-10-CM | POA: Diagnosis not present

## 2015-10-02 DIAGNOSIS — L300 Nummular dermatitis: Secondary | ICD-10-CM | POA: Diagnosis not present

## 2015-10-02 DIAGNOSIS — C44319 Basal cell carcinoma of skin of other parts of face: Secondary | ICD-10-CM | POA: Diagnosis not present

## 2015-10-05 DIAGNOSIS — M5416 Radiculopathy, lumbar region: Secondary | ICD-10-CM | POA: Diagnosis not present

## 2015-10-05 DIAGNOSIS — M9903 Segmental and somatic dysfunction of lumbar region: Secondary | ICD-10-CM | POA: Diagnosis not present

## 2015-10-05 DIAGNOSIS — M9905 Segmental and somatic dysfunction of pelvic region: Secondary | ICD-10-CM | POA: Diagnosis not present

## 2015-10-05 DIAGNOSIS — M955 Acquired deformity of pelvis: Secondary | ICD-10-CM | POA: Diagnosis not present

## 2015-10-09 DIAGNOSIS — H6623 Chronic atticoantral suppurative otitis media, bilateral: Secondary | ICD-10-CM | POA: Diagnosis not present

## 2015-10-19 ENCOUNTER — Other Ambulatory Visit: Payer: Self-pay

## 2015-10-19 MED ORDER — SIMVASTATIN 80 MG PO TABS: 80.0000 mg | ORAL_TABLET | Freq: Every day | ORAL | Status: DC

## 2015-11-02 ENCOUNTER — Ambulatory Visit (INDEPENDENT_AMBULATORY_CARE_PROVIDER_SITE_OTHER): Payer: Medicare Other | Admitting: Family Medicine

## 2015-11-02 ENCOUNTER — Encounter: Payer: Self-pay | Admitting: Family Medicine

## 2015-11-02 VITALS — BP 105/74 | HR 94 | Temp 99.0°F | Resp 18 | Ht 62.0 in | Wt 181.3 lb

## 2015-11-02 DIAGNOSIS — R05 Cough: Secondary | ICD-10-CM | POA: Diagnosis not present

## 2015-11-02 DIAGNOSIS — R058 Other specified cough: Secondary | ICD-10-CM | POA: Insufficient documentation

## 2015-11-02 MED ORDER — BENZONATATE 200 MG PO CAPS: 200.0000 mg | ORAL_CAPSULE | Freq: Three times a day (TID) | ORAL | Status: DC | PRN

## 2015-11-02 NOTE — Progress Notes (Signed)
Name: Angel [name]   MRN: [id number]    DOB: 1930-11-10   Date:11/02/2015       Progress Note  Subjective  Chief Complaint  Chief Complaint  Patient presents with  . Acute Visit    Cough/chest congestion    HPI  Cough: Onset 2 days ago, started with a lot of clear to yellowish mucus. No fevers or chills, but experiencing a lot of fatigue.   Past Medical History  Diagnosis Date  . HTN (hypertension)   . HLD (hyperlipidemia)   . CAD (coronary artery disease)   . Hyperlipidemia LDL goal <130   . HTN, goal below 150/90   . Hearing loss   . Anxiety   . CAD in native artery   . Endometriosis   . Urge and stress incontinence   . Osteopenia of the elderly   . Chronic hip pain   . Aortic aneurysm without rupture [facility])     Past Surgical History  Procedure Laterality Date  . Appendectomy    . Abdominal hysterectomy  1976    total  . Breast excisional biopsy Left 1987    benign    Family History  Problem Relation Age of Onset  . CVA Mother   . Breast cancer Maternal Grandmother 46    Social History   Social History  . Marital Status: Widowed    Spouse Name: N/A  . Number of Children: N/A  . Years of Education: N/A   Occupational History  . Not on file.   Social History Main Topics  . Smoking status: Never Smoker   . Smokeless tobacco: Never Used  . Alcohol Use: 0.0 oz/week    0 Standard drinks or equivalent per week     Comment: occasional  . Drug Use: No  . Sexual Activity: No   Other Topics Concern  . Not on file   Social History Narrative     Current outpatient prescriptions:  .  amLODipine (NORVASC) 5 MG tablet, , Disp: , Rfl:  .  estrogens, conjugated, (PREMARIN) 0.625 MG tablet, TAKE ONE TABLET EVERY OTHER DAY, Disp: 45 tablet, Rfl: 1 .  imipramine (TOFRANIL) 25 MG tablet, Take 25 mg by mouth 2 (two) times daily., Disp: , Rfl:  .  lisinopril (PRINIVIL,ZESTRIL) 40 MG tablet, Take 2 tablets (80 mg total) by mouth daily., Disp: 180 tablet, Rfl:  3 .  metoprolol succinate (TOPROL-XL) 50 MG 24 hr tablet, Take 1 tablet (50 mg total) by mouth daily. Take with or immediately following a meal., Disp: 90 tablet, Rfl: 2 .  mometasone (ELOCON) 0.1 % cream, Apply 1 application topically daily., Disp: 45 g, Rfl: 0 .  simvastatin (ZOCOR) 80 MG tablet, Take 1 tablet (80 mg total) by mouth daily at 6 PM., Disp: 90 tablet, Rfl: 1  Allergies  Allergen Reactions  . Codeine Other (See Comments)    "zoned out"  . Levofloxacin Other (See Comments)    AMS  . Prednisone Other (See Comments)    AMS  . Sulfa Antibiotics Other (See Comments)    AMS     Review of Systems  Constitutional: Negative for fever and chills.  HENT: Positive for sore throat.   Respiratory: Positive for cough. Negative for shortness of breath and wheezing.   Cardiovascular: Negative for chest pain.      Objective  Filed Vitals:   11/02/15 1413  BP: 105/74  Pulse: 94  Temp: 99 F (37.2 C)  TempSrc: Oral  Resp: 18  Height:  5\' 2"  (1.575 m)  Weight: 181 lb 4.8 oz (82.237 kg)  SpO2: 93%    Physical Exam  Constitutional: She is well-developed, well-nourished, and in no distress.  Cardiovascular: Normal rate and regular rhythm.   Pulmonary/Chest: Effort normal and breath sounds normal. She has no wheezes. She has no rales.  Psychiatric: Mood, memory, affect and judgment normal.  Nursing note and vitals reviewed.        Assessment & Plan  1. Productive cough Likely upper respiratory infection, will start on Tessalon. Follow-up if no improvement. - benzonatate (TESSALON) 200 MG capsule; Take 1 capsule (200 mg total) by mouth 3 (three) times daily as needed for cough.  Dispense: 20 capsule; Refill: 0   Markian Glockner Asad [facility] 11/02/2015 2:30 PM

## 2015-11-05 ENCOUNTER — Telehealth: Payer: Self-pay | Admitting: Family Medicine

## 2015-11-05 ENCOUNTER — Telehealth: Payer: Self-pay

## 2015-11-05 DIAGNOSIS — R058 Other specified cough: Secondary | ICD-10-CM

## 2015-11-05 DIAGNOSIS — R05 Cough: Secondary | ICD-10-CM

## 2015-11-05 MED ORDER — ALBUTEROL SULFATE HFA 108 (90 BASE) MCG/ACT IN AERS: 2.0000 | INHALATION_SPRAY | Freq: Four times a day (QID) | RESPIRATORY_TRACT | Status: DC | PRN

## 2015-11-05 MED ORDER — AZITHROMYCIN 250 MG PO TABS: ORAL_TABLET | ORAL | Status: DC

## 2015-11-05 NOTE — Telephone Encounter (Signed)
Routed to Dr. Manuella Ghazi with patient response

## 2015-11-05 NOTE — Telephone Encounter (Signed)
Yes, she is still contagious, may wear a mask to minimize the risk of infection spreading to other people.

## 2015-11-05 NOTE — Telephone Encounter (Signed)
Patient stated that her cough is much better, but still has low energy.  She would like to know if she is contagious because is suppose be with a group of people this weekend.

## 2015-11-05 NOTE — Telephone Encounter (Signed)
Patient reports she is wheezing mainly at night, coughing is getting better, although still have some congestion. No fevers. Wondering when she is going to get over this infection. Because of the duration, it is reasonable to start her on an antibiotic and also inhaler to help with wheezing. Recommended Azithromycin and Ventolin inhaler and will call in to patient's pharmacy. Verbalized agreement.

## 2015-11-05 NOTE — Telephone Encounter (Signed)
Routed to Dr. Shah for advice  

## 2015-11-12 DIAGNOSIS — C44319 Basal cell carcinoma of skin of other parts of face: Secondary | ICD-10-CM | POA: Diagnosis not present

## 2015-11-12 DIAGNOSIS — L905 Scar conditions and fibrosis of skin: Secondary | ICD-10-CM | POA: Diagnosis not present

## 2015-11-23 DIAGNOSIS — M955 Acquired deformity of pelvis: Secondary | ICD-10-CM | POA: Diagnosis not present

## 2015-11-23 DIAGNOSIS — M9905 Segmental and somatic dysfunction of pelvic region: Secondary | ICD-10-CM | POA: Diagnosis not present

## 2015-11-23 DIAGNOSIS — M9903 Segmental and somatic dysfunction of lumbar region: Secondary | ICD-10-CM | POA: Diagnosis not present

## 2015-11-23 DIAGNOSIS — M5416 Radiculopathy, lumbar region: Secondary | ICD-10-CM | POA: Diagnosis not present

## 2015-12-21 DIAGNOSIS — M9905 Segmental and somatic dysfunction of pelvic region: Secondary | ICD-10-CM | POA: Diagnosis not present

## 2015-12-21 DIAGNOSIS — M955 Acquired deformity of pelvis: Secondary | ICD-10-CM | POA: Diagnosis not present

## 2015-12-21 DIAGNOSIS — M9903 Segmental and somatic dysfunction of lumbar region: Secondary | ICD-10-CM | POA: Diagnosis not present

## 2015-12-21 DIAGNOSIS — M5416 Radiculopathy, lumbar region: Secondary | ICD-10-CM | POA: Diagnosis not present

## 2015-12-30 ENCOUNTER — Other Ambulatory Visit: Payer: Self-pay

## 2015-12-30 ENCOUNTER — Telehealth: Payer: Self-pay | Admitting: Family Medicine

## 2015-12-30 DIAGNOSIS — E785 Hyperlipidemia, unspecified: Secondary | ICD-10-CM

## 2015-12-30 DIAGNOSIS — Z5181 Encounter for therapeutic drug level monitoring: Secondary | ICD-10-CM

## 2015-12-30 MED ORDER — ATORVASTATIN CALCIUM 40 MG PO TABS: 40.0000 mg | ORAL_TABLET | Freq: Every day | ORAL | Status: DC

## 2015-12-30 MED ORDER — AMLODIPINE BESYLATE 5 MG PO TABS: 5.0000 mg | ORAL_TABLET | Freq: Every day | ORAL | Status: DC

## 2015-12-30 NOTE — Telephone Encounter (Signed)
Please call pt Angel [name]

## 2015-12-30 NOTE — Assessment & Plan Note (Signed)
Check lipids; switch from simvastatin to atorvastastin

## 2015-12-30 NOTE — Telephone Encounter (Signed)
Patient informed and she did schedule appointment for next month

## 2016-01-05 ENCOUNTER — Ambulatory Visit (INDEPENDENT_AMBULATORY_CARE_PROVIDER_SITE_OTHER): Payer: Medicare Other | Admitting: Family Medicine

## 2016-01-05 ENCOUNTER — Encounter: Payer: Self-pay | Admitting: Family Medicine

## 2016-01-05 VITALS — BP 122/68 | HR 71 | Temp 97.6°F | Resp 16 | Wt 182.0 lb

## 2016-01-05 DIAGNOSIS — L259 Unspecified contact dermatitis, unspecified cause: Secondary | ICD-10-CM

## 2016-01-05 DIAGNOSIS — I10 Essential (primary) hypertension: Secondary | ICD-10-CM | POA: Diagnosis not present

## 2016-01-05 DIAGNOSIS — I2510 Atherosclerotic heart disease of native coronary artery without angina pectoris: Secondary | ICD-10-CM

## 2016-01-05 MED ORDER — CLOBETASOL PROPIONATE 0.05 % EX CREA: 1.0000 "application " | TOPICAL_CREAM | Freq: Two times a day (BID) | CUTANEOUS | Status: DC

## 2016-01-05 NOTE — Progress Notes (Signed)
BP 122/68 mmHg  Pulse 71  Temp(Src) 97.6 F (36.4 C) (Oral)  Resp 16  Wt 182 lb (82.555 kg)  SpO2 93%  LMP  (LMP Unknown)   Subjective:    Patient ID: Angel [name], female    DOB: [birth year], 82 y.o.   MRN: [id number]  HPI: Angel [name] is a 82 y.o. female  Chief Complaint  Patient presents with  . Rash    poision ivy   She is new to me Chronic issues with severe poison ivy Yesterday she was outside, had everything covered, out in the woods on meditation trails This morning, work up with poison ivy; has to get help right away; left posterior leg; tepid water to bathe to keep it from spreading Also has bites on her back; not sure Had mental disorientation from oral prednisone, listed as an allergy  HTN; on multiple medicines for this; has had it since her 80's; very fortunate to have the right medicines Watches her nutrition and exercises with PT; does meditation; runs a nature conservancy, vibrant community; does use real sea salt; no black licorice; had a URI and came here in March  Depression screen [facility] 2/9 01/05/2016 11/02/2015 08/04/2015 04/02/2015  Decreased Interest 0 0 0 0  Down, Depressed, Hopeless 0 0 0 0  PHQ - 2 Score 0 0 0 0   Relevant past medical, surgical, family and social history reviewed Past Medical History  Diagnosis Date  . HTN (hypertension)   . HLD (hyperlipidemia)   . CAD (coronary artery disease)   . Hyperlipidemia LDL goal <130   . HTN, goal below 150/90   . Hearing loss   . Anxiety   . CAD in native artery   . Endometriosis   . Urge and stress incontinence   . Osteopenia of the elderly   . Chronic hip pain   . Aortic aneurysm without rupture ([place])   . Hyperlipidemia LDL goal <70 11/18/2014   Past Surgical History  Procedure Laterality Date  . Appendectomy    . Abdominal hysterectomy  1976    total  . Breast excisional biopsy Left 1987    benign   Family History  Problem Relation Age of Onset  . CVA Mother   . Breast  cancer Maternal Grandmother 50   Social History  Substance Use Topics  . Smoking status: Never Smoker   . Smokeless tobacco: Never Used  . Alcohol Use: 0.0 oz/week    0 Standard drinks or equivalent per week     Comment: occasional   Interim medical history since last visit reviewed. Allergies and medications reviewed  Review of Systems Per HPI unless specifically indicated above     Objective:    BP 122/68 mmHg  Pulse 71  Temp(Src) 97.6 F (36.4 C) (Oral)  Resp 16  Wt 182 lb (82.555 kg)  SpO2 93%  LMP  (LMP Unknown)  Wt Readings from Last 3 Encounters:  01/13/16 181 lb 11.2 oz (82.419 kg)  01/11/16 181 lb 3.2 oz (82.192 kg)  01/05/16 182 lb (82.555 kg)    Physical Exam  Constitutional: She appears well-developed and well-nourished. No distress.  Cardiovascular: Normal rate.   Pulmonary/Chest: Effort normal.  Neurological: She is alert. She displays no tremor.  Skin: Rash (eruthematous rash on extremities, consistent w/contact dermatitis; no vesicles and not patterns to suggested shingles) noted. She is not diaphoretic.  Psychiatric: Her mood appears not anxious. She does not exhibit a depressed mood.  Very pleasant and  cooperative      Assessment & Plan:   Problem List Items Addressed This Visit      Cardiovascular and Mediastinum   Hypertension goal BP (blood pressure) < 140/90 - Primary    Try DASH guidelines; continue meds; good control; stress reduction       Other Visit Diagnoses    Contact dermatitis        suggestive of rhus dermatitis; avoidance of trigger; cover up; steroid cream topically; I am not a fan of steroid injections b/c risk of osteoporosis      Follow up plan: No Follow-up on file.  An after-visit summary was printed and given to the patient at [facility].  Please see the patient instructions which may contain other information and recommendations beyond what is mentioned above in the assessment and plan.  Meds ordered this encounter    Medications  . clobetasol cream (TEMOVATE) 0.05 %    Sig: Apply 1 application topically 2 (two) times daily. Too strong for face, underarms, groin    Dispense:  30 g    Refill:  1

## 2016-01-05 NOTE — Patient Instructions (Addendum)
Use the new cream for the affected areas as needed Find the repellent that is right for you [email or web address]  Try to use PLAIN allergy medicine without the decongestant Avoid: phenylephrine, phenylpropanolamine, and pseudoephredine D= Don't buy it  Try to follow the DASH guidelines (DASH stands for Dietary Approaches to Stop Hypertension) Try to limit the sodium in your diet.  Ideally, consume less than 1.5 grams (less than 1,500mg ) per day. Do not add salt when cooking or at the table.  Check the sodium amount on labels when shopping, and choose items lower in sodium when given a choice. Avoid or limit foods that already contain a lot of sodium. Eat a diet rich in fruits and vegetables and whole grains.  Please have fasting labs done in the next several days and return for cholesterol and aneurysm and other issues

## 2016-01-05 NOTE — Assessment & Plan Note (Addendum)
Try DASH guidelines; continue meds; good control; stress reduction

## 2016-01-06 DIAGNOSIS — E785 Hyperlipidemia, unspecified: Secondary | ICD-10-CM | POA: Diagnosis not present

## 2016-01-06 DIAGNOSIS — Z5181 Encounter for therapeutic drug level monitoring: Secondary | ICD-10-CM | POA: Diagnosis not present

## 2016-01-07 LAB — BASIC METABOLIC PANEL
BUN/Creatinine Ratio: 31 — ABNORMAL HIGH (ref 12–28)
BUN: 21 mg/dL (ref 8–27)
CO2: 26 mmol/L (ref 18–29)
Calcium: 8.9 mg/dL (ref 8.7–10.3)
Chloride: 102 mmol/L (ref 96–106)
Creatinine, Ser: 0.67 mg/dL (ref 0.57–1.00)
GFR calc Af Amer: 94 mL/min/{1.73_m2} (ref >59)
GFR calc non Af Amer: 82 mL/min/{1.73_m2} (ref >59)
Glucose: 98 mg/dL (ref 65–99)
Potassium: 4.4 mmol/L (ref 3.5–5.2)
Sodium: 142 mmol/L (ref 134–144)

## 2016-01-07 LAB — LIPID PANEL W/O CHOL/HDL RATIO
Cholesterol, Total: 170 mg/dL (ref 100–199)
HDL: 53 mg/dL (ref >39)
LDL Calculated: 77 mg/dL (ref 0–99)
Triglycerides: 201 mg/dL — ABNORMAL HIGH (ref 0–149)
VLDL Cholesterol Cal: 40 mg/dL (ref 5–40)

## 2016-01-07 LAB — ALT: ALT: 30 IU/L (ref 0–32)

## 2016-01-11 ENCOUNTER — Ambulatory Visit (INDEPENDENT_AMBULATORY_CARE_PROVIDER_SITE_OTHER): Payer: Medicare Other | Admitting: Interventional Cardiology

## 2016-01-11 ENCOUNTER — Encounter: Payer: Self-pay | Admitting: Interventional Cardiology

## 2016-01-11 VITALS — BP 124/76 | HR 76 | Ht 63.5 in | Wt 181.2 lb

## 2016-01-11 DIAGNOSIS — I2510 Atherosclerotic heart disease of native coronary artery without angina pectoris: Secondary | ICD-10-CM

## 2016-01-11 DIAGNOSIS — E785 Hyperlipidemia, unspecified: Secondary | ICD-10-CM

## 2016-01-11 DIAGNOSIS — I10 Essential (primary) hypertension: Secondary | ICD-10-CM | POA: Diagnosis not present

## 2016-01-11 NOTE — Progress Notes (Signed)
Cardiology Office Note    Date:  01/11/2016   ID:  Angel, [name] November 25, 1930, MRN [id number]  PCP:  Enid Derry, MD  Cardiologist: Sinclair Grooms, MD   Chief Complaint  Patient presents with  . Coronary Artery Disease  . Follow-up    Htn    History of Present Illness:  Angel [name] is a 82 y.o. female poor R-wave progression on EKG, prior history of abnormal myocardial perfusion study, question of small apical infarct or diverticulum by cardiac MRI, hypertension, and hyperlipidemia.  The patient is doing well. She denies cardiovascular complaints. She has had no episodes of syncope or near syncope. No peripheral edema. No medication side effects.    Past Medical History  Diagnosis Date  . HTN (hypertension)   . HLD (hyperlipidemia)   . CAD (coronary artery disease)   . Hyperlipidemia LDL goal <130   . HTN, goal below 150/90   . Hearing loss   . Anxiety   . CAD in native artery   . Endometriosis   . Urge and stress incontinence   . Osteopenia of the elderly   . Chronic hip pain   . Aortic aneurysm without rupture [facility])     Past Surgical History  Procedure Laterality Date  . Appendectomy    . Abdominal hysterectomy  1976    total  . Breast excisional biopsy Left 1987    benign    Current Medications: Outpatient Prescriptions Prior to Visit  Medication Sig Dispense Refill  . albuterol (PROVENTIL HFA;VENTOLIN HFA) 108 (90 Base) MCG/ACT inhaler Inhale 2 puffs into the lungs every 6 (six) hours as needed for wheezing or shortness of breath. 1 Inhaler 0  . amLODipine (NORVASC) 5 MG tablet Take 1 tablet (5 mg total) by mouth daily. 90 tablet 1  . atorvastatin (LIPITOR) 40 MG tablet Take 1 tablet (40 mg total) by mouth at bedtime. (this replaces simvastatin) 90 tablet 1  . benzonatate (TESSALON) 200 MG capsule Take 1 capsule (200 mg total) by mouth 3 (three) times daily as needed for cough. 20 capsule 0  . clobetasol cream (TEMOVATE) [id number] % Apply 1  application topically 2 (two) times daily. Too strong for face, underarms, groin 30 g 1  . estrogens, conjugated, (PREMARIN) 0.625 MG tablet TAKE ONE TABLET EVERY OTHER DAY 45 tablet 1  . imipramine (TOFRANIL) 25 MG tablet Take 25 mg by mouth 2 (two) times daily.    [name] lisinopril (PRINIVIL,ZESTRIL) 40 MG tablet Take 2 tablets (80 mg total) by mouth daily. 180 tablet 3  . metoprolol succinate (TOPROL-XL) 50 MG 24 hr tablet Take 1 tablet (50 mg total) by mouth daily. Take with or immediately following a meal. 90 tablet 2  . mometasone (ELOCON) 0.1 % cream Apply 1 application topically daily. 45 g 0   No facility-administered medications prior to visit.     Allergies:   Codeine; Levofloxacin; Prednisone; and Sulfa antibiotics   Social History   Social History  . Marital Status: Widowed    Spouse Name: N/A  . Number of Children: N/A  . Years of Education: N/A   Social History Main Topics  . Smoking status: Never Smoker   . Smokeless tobacco: Never Used  . Alcohol Use: 0.0 oz/week    0 Standard drinks or equivalent per week     Comment: occasional  . Drug Use: No  . Sexual Activity: No   Other Topics Concern  . None   Social History Narrative  Family History:  The patient's family history includes Breast cancer (age of onset: 15) in her maternal grandmother; CVA in her mother.   ROS:   Please see the history of present illness.    Difficulty with balance. Late afternoon fatigue. Sleeping well. Appetite is stable.  All other systems reviewed and are negative.   PHYSICAL EXAM:   VS:  BP 124/76 mmHg  Pulse 76  Ht 5' 3.5" (1.613 m)  Wt 181 lb 3.2 oz (82.192 kg)  BMI 31.59 kg/m2  LMP  (LMP Unknown)   GEN: Well nourished, well developed, in no acute distress HEENT: normal Neck: no JVD, carotid bruits, or masses Cardiac: RRR; no murmurs, rubs, or gallops,no edema  Respiratory:  clear to auscultation bilaterally, normal work of breathing GI: soft, nontender, nondistended,  + BS MS: no deformity or atrophy Skin: warm and dry, no rash Neuro:  Alert and Oriented x 3, Strength and sensation are intact Psych: euthymic mood, full affect  Wt Readings from Last 3 Encounters:  01/11/16 181 lb 3.2 oz (82.192 kg)  01/05/16 182 lb (82.555 kg)  11/02/15 181 lb 4.8 oz (82.237 kg)      Studies/Labs Reviewed:   EKG:  EKG  Reveals normal sinus rhythm, left atrial abnormality, poor R-wave progression. No change when compared to prior.  Recent Labs: 01/06/2016: ALT 30; BUN 21; Creatinine, Ser 0.67; Potassium 4.4; Sodium 142   Lipid Panel    Component Value Date/Time   CHOL 170 01/06/2016 0839   CHOL 213* 11/06/2013 1045   TRIG 201* 01/06/2016 0839   HDL 53 01/06/2016 0839   HDL 51.90 11/06/2013 1045   CHOLHDL 4 11/06/2013 1045   VLDL 54.0* 11/06/2013 1045   LDLCALC 77 01/06/2016 0839   LDLCALC 107* 11/06/2013 1045    Additional studies/ records that were reviewed today include:  Reviewed laboratory data done recently by Dr. Sanda Klein.    ASSESSMENT:    1. Coronary artery disease involving native coronary artery of native heart without angina pectoris   2. Essential hypertension   3. Hyperlipidemia      PLAN:  In order of problems listed above:  1. Presumed CAD. No prior history of cardiac event although an abnormal nuclear study several years ago and an MRI raises a question of an apical infarct. She is very active and has no limitations. Cautioned to notify us if chest discomfort, palpitations, or dyspnea. 2. Very well controlled blood pressure at this time. No need to change the current medical regimen. We'll need to watch her heart rate as she ages. If heart rate begins to decrease we will need to decrease the dose of metoprolol. 3. Lipid panel was reviewed. Triglyceride is mildly elevated. LDL is at target of 77.    Medication Adjustments/Labs and Tests Ordered: Current medicines are reviewed at length with the patient today.  Concerns regarding  medicines are outlined above.  Medication changes, Labs and Tests ordered today are listed in the Patient Instructions below. There are no Patient Instructions on file for this visit.   Signed, Sinclair Grooms, MD  01/11/2016 10:04 AM    [facility] [address], [place], [place]  [address] Phone: [phone]; Fax: [phone]

## 2016-01-11 NOTE — Patient Instructions (Signed)

## 2016-01-13 ENCOUNTER — Encounter: Payer: Self-pay | Admitting: Family Medicine

## 2016-01-13 ENCOUNTER — Ambulatory Visit (INDEPENDENT_AMBULATORY_CARE_PROVIDER_SITE_OTHER): Payer: Medicare Other | Admitting: Family Medicine

## 2016-01-13 VITALS — BP 118/78 | HR 86 | Temp 98.5°F | Resp 16 | Wt 181.7 lb

## 2016-01-13 DIAGNOSIS — Z5181 Encounter for therapeutic drug level monitoring: Secondary | ICD-10-CM

## 2016-01-13 DIAGNOSIS — I2510 Atherosclerotic heart disease of native coronary artery without angina pectoris: Secondary | ICD-10-CM | POA: Diagnosis not present

## 2016-01-13 DIAGNOSIS — E785 Hyperlipidemia, unspecified: Secondary | ICD-10-CM

## 2016-01-13 NOTE — Progress Notes (Signed)
BP 118/78 mmHg  Pulse 86  Temp(Src) 98.5 F (36.9 C) (Oral)  Resp 16  Wt 181 lb 11.2 oz (82.419 kg)  SpO2 93%  LMP  (LMP Unknown)   Subjective:    Patient ID: Angel [name], female    DOB: Feb 25, 1931, 82 y.o.   MRN: [id number]  HPI: Angel [name] is a 82 y.o. female  Chief Complaint  Patient presents with  . Medication Refill   Patient is here to go over her labs High TG; no fried foods; does have sugar free diet drinks; bread is usually whole grain; eats 1 egg a day; does love cheese, "it's my downfall" LDL has come down from 107 to 77 HDL 53 which is very good She probably does not drink enough water Stage 2 CKD, taking two ibuprofen every day for general wear and tear;  She saw Dr. Tamala Julian (cardiologist), told her she is doing extremely well and wanted her to keep doing what she's doing, meditation; reviewed EKG and no changes he says I changed the simvastatin to atorvastatin and he agreed with that Working with physical therapist, Thurmond Butts twice a week, strenthening, helping with mobility and balance, can now scamper upstairs She had a hysterectomy in 1976, took estrogen every night for many years until 2-3 years ago; the doctor chnaged it to 3 days per week; staying up-to-date with mammograms  Depression screen [facility] 2/9 01/05/2016 11/02/2015 08/04/2015 04/02/2015  Decreased Interest 0 0 0 0  Down, Depressed, Hopeless 0 0 0 0  PHQ - 2 Score 0 0 0 0   Relevant past medical, surgical, family and social history reviewed Past Medical History  Diagnosis Date  . HTN (hypertension)   . HLD (hyperlipidemia)   . CAD (coronary artery disease)   . Hyperlipidemia LDL goal <130   . HTN, goal below 150/90   . Hearing loss   . Anxiety   . CAD in native artery   . Endometriosis   . Urge and stress incontinence   . Osteopenia of the elderly   . Chronic hip pain   . Aortic aneurysm without rupture ([place])   . Hyperlipidemia LDL goal <70 11/18/2014   Past Surgical History  Procedure  Laterality Date  . Appendectomy    . Abdominal hysterectomy  1976    total  . Breast excisional biopsy Left 1987    benign   Family History  Problem Relation Age of Onset  . CVA Mother   . Breast cancer Maternal Grandmother 45   Social History  Substance Use Topics  . Smoking status: Never Smoker   . Smokeless tobacco: Never Used  . Alcohol Use: 0.0 oz/week    0 Standard drinks or equivalent per week     Comment: occasional    Interim medical history since last visit reviewed. Allergies and medications reviewed  Review of Systems Per HPI unless specifically indicated above     Objective:    BP 118/78 mmHg  Pulse 86  Temp(Src) 98.5 F (36.9 C) (Oral)  Resp 16  Wt 181 lb 11.2 oz (82.419 kg)  SpO2 93%  LMP  (LMP Unknown)  Wt Readings from Last 3 Encounters:  01/13/16 181 lb 11.2 oz (82.419 kg)  01/11/16 181 lb 3.2 oz (82.192 kg)  01/05/16 182 lb (82.555 kg)    Physical Exam  Constitutional: She appears well-developed and well-nourished. No distress.  Cardiovascular: Normal rate.   Pulmonary/Chest: Effort normal.  Abdominal: She exhibits no distension.  Skin: No pallor.  Psychiatric: She has a normal mood and affect.   Results for orders placed or performed in visit on 12/30/15  ALT  Result Value Ref Range   ALT 30 0 - 32 IU/L  Basic metabolic panel  Result Value Ref Range   Glucose 98 65 - 99 mg/dL   BUN 21 8 - 27 mg/dL   Creatinine, Ser 0.67 0.57 - 1.00 mg/dL   GFR calc non Af Amer 82 >59 mL/min/1.73   GFR calc Af Amer 94 >59 mL/min/1.73   BUN/Creatinine Ratio 31 (H) 12 - 28   Sodium 142 134 - 144 mmol/L   Potassium 4.4 3.5 - 5.2 mmol/L   Chloride 102 96 - 106 mmol/L   CO2 26 18 - 29 mmol/L   Calcium 8.9 8.7 - 10.3 mg/dL  Lipid Panel w/o Chol/HDL Ratio  Result Value Ref Range   Cholesterol, Total 170 100 - 199 mg/dL   Triglycerides 201 (H) 0 - 149 mg/dL   HDL 53 >39 mg/dL   VLDL Cholesterol Cal 40 5 - 40 mg/dL   LDL Calculated 77 0 - 99 mg/dL       Assessment & Plan:   Problem List Items Addressed This Visit      Cardiovascular and Mediastinum   CAD (coronary artery disease) - Primary    Goal LDL under 70      Relevant Orders   Lipid Panel w/o Chol/HDL Ratio     Other   Hyperlipidemia LDL goal <70    She will start the atorvastatin soon and then recheck labs in 6 weeks; goal LDL under 70; showed cross-sectional artery model; limit saturated fats      Relevant Orders   Lipid Panel w/o Chol/HDL Ratio   Medication monitoring encounter    Check sgpt on atoravastatin      Relevant Orders   ALT      Follow up plan: Return in about 6 months (around 07/14/2016) for visit.  An after-visit summary was printed and given to the patient at [facility].  Please see the patient instructions which may contain other information and recommendations beyond what is mentioned above in the assessment and plan.   Orders Placed This Encounter  Procedures  . ALT  . Lipid Panel w/o Chol/HDL Ratio

## 2016-01-13 NOTE — Assessment & Plan Note (Signed)
Goal LDL under 70 

## 2016-01-13 NOTE — Patient Instructions (Addendum)
Try turmeric as a natural anti-inflammatory (for pain and arthritis). It comes in capsules where you buy aspirin and fish oil, but also as a spice where you buy pepper and garlic powder. If you need something for aches or pains, try to use Tylenol (acetaminophen) instead of non-steroidals (which include Aleve, ibuprofen, Advil, Motrin, and naproxen); non-steroidals can cause long-term kidney damage Try to drink 64 ounces of water a day Please do have labs done on or just after July 20th Try to limit saturated fats in your diet (bologna, hot dogs, barbeque, cheeseburgers, hamburgers, steak, bacon, sausage, cheese, etc.) and get more fresh fruits, vegetables, and whole grains

## 2016-01-13 NOTE — Assessment & Plan Note (Signed)
Check sgpt on atoravastatin

## 2016-01-13 NOTE — Assessment & Plan Note (Signed)
She will start the atorvastatin soon and then recheck labs in 6 weeks; goal LDL under 70; showed cross-sectional artery model; limit saturated fats

## 2016-01-18 DIAGNOSIS — M9905 Segmental and somatic dysfunction of pelvic region: Secondary | ICD-10-CM | POA: Diagnosis not present

## 2016-01-18 DIAGNOSIS — M9903 Segmental and somatic dysfunction of lumbar region: Secondary | ICD-10-CM | POA: Diagnosis not present

## 2016-01-18 DIAGNOSIS — M955 Acquired deformity of pelvis: Secondary | ICD-10-CM | POA: Diagnosis not present

## 2016-01-18 DIAGNOSIS — M5416 Radiculopathy, lumbar region: Secondary | ICD-10-CM | POA: Diagnosis not present

## 2016-02-12 DIAGNOSIS — S70362A Insect bite (nonvenomous), left thigh, initial encounter: Secondary | ICD-10-CM | POA: Diagnosis not present

## 2016-02-12 DIAGNOSIS — Z85828 Personal history of other malignant neoplasm of skin: Secondary | ICD-10-CM | POA: Diagnosis not present

## 2016-02-12 DIAGNOSIS — L821 Other seborrheic keratosis: Secondary | ICD-10-CM | POA: Diagnosis not present

## 2016-02-12 DIAGNOSIS — Z08 Encounter for follow-up examination after completed treatment for malignant neoplasm: Secondary | ICD-10-CM | POA: Diagnosis not present

## 2016-02-15 DIAGNOSIS — M9905 Segmental and somatic dysfunction of pelvic region: Secondary | ICD-10-CM | POA: Diagnosis not present

## 2016-02-15 DIAGNOSIS — M5416 Radiculopathy, lumbar region: Secondary | ICD-10-CM | POA: Diagnosis not present

## 2016-02-15 DIAGNOSIS — M9903 Segmental and somatic dysfunction of lumbar region: Secondary | ICD-10-CM | POA: Diagnosis not present

## 2016-02-15 DIAGNOSIS — M955 Acquired deformity of pelvis: Secondary | ICD-10-CM | POA: Diagnosis not present

## 2016-03-11 ENCOUNTER — Other Ambulatory Visit: Payer: Self-pay

## 2016-03-11 DIAGNOSIS — Z79890 Hormone replacement therapy (postmenopausal): Secondary | ICD-10-CM

## 2016-03-12 MED ORDER — ESTROGENS CONJUGATED 0.625 MG PO TABS: ORAL_TABLET | ORAL | 1 refills | Status: DC

## 2016-03-12 NOTE — Telephone Encounter (Signed)
Last mammo Dec 2016; Rx approved

## 2016-03-21 DIAGNOSIS — M9905 Segmental and somatic dysfunction of pelvic region: Secondary | ICD-10-CM | POA: Diagnosis not present

## 2016-03-21 DIAGNOSIS — M9903 Segmental and somatic dysfunction of lumbar region: Secondary | ICD-10-CM | POA: Diagnosis not present

## 2016-03-21 DIAGNOSIS — M955 Acquired deformity of pelvis: Secondary | ICD-10-CM | POA: Diagnosis not present

## 2016-03-21 DIAGNOSIS — M5416 Radiculopathy, lumbar region: Secondary | ICD-10-CM | POA: Diagnosis not present

## 2016-04-04 ENCOUNTER — Other Ambulatory Visit: Payer: Self-pay

## 2016-04-04 MED ORDER — METOPROLOL SUCCINATE ER 50 MG PO TB24: 50.0000 mg | ORAL_TABLET | Freq: Every day | ORAL | 1 refills | Status: DC

## 2016-04-19 DIAGNOSIS — N3941 Urge incontinence: Secondary | ICD-10-CM | POA: Diagnosis not present

## 2016-04-19 DIAGNOSIS — N393 Stress incontinence (female) (male): Secondary | ICD-10-CM | POA: Diagnosis not present

## 2016-04-19 DIAGNOSIS — R339 Retention of urine, unspecified: Secondary | ICD-10-CM | POA: Diagnosis not present

## 2016-05-03 DIAGNOSIS — M9905 Segmental and somatic dysfunction of pelvic region: Secondary | ICD-10-CM | POA: Diagnosis not present

## 2016-05-03 DIAGNOSIS — M9903 Segmental and somatic dysfunction of lumbar region: Secondary | ICD-10-CM | POA: Diagnosis not present

## 2016-05-03 DIAGNOSIS — M955 Acquired deformity of pelvis: Secondary | ICD-10-CM | POA: Diagnosis not present

## 2016-05-03 DIAGNOSIS — M5416 Radiculopathy, lumbar region: Secondary | ICD-10-CM | POA: Diagnosis not present

## 2016-06-07 DIAGNOSIS — M9905 Segmental and somatic dysfunction of pelvic region: Secondary | ICD-10-CM | POA: Diagnosis not present

## 2016-06-07 DIAGNOSIS — M5416 Radiculopathy, lumbar region: Secondary | ICD-10-CM | POA: Diagnosis not present

## 2016-06-07 DIAGNOSIS — M955 Acquired deformity of pelvis: Secondary | ICD-10-CM | POA: Diagnosis not present

## 2016-06-07 DIAGNOSIS — M9903 Segmental and somatic dysfunction of lumbar region: Secondary | ICD-10-CM | POA: Diagnosis not present

## 2016-06-08 ENCOUNTER — Other Ambulatory Visit: Payer: Self-pay

## 2016-06-09 MED ORDER — LISINOPRIL 40 MG PO TABS: 80.0000 mg | ORAL_TABLET | Freq: Every day | ORAL | 0 refills | Status: DC

## 2016-06-09 NOTE — Telephone Encounter (Signed)
Bmp May 2017 reviewed; Rx approved

## 2016-07-05 ENCOUNTER — Other Ambulatory Visit: Payer: Self-pay | Admitting: Family Medicine

## 2016-07-05 DIAGNOSIS — M955 Acquired deformity of pelvis: Secondary | ICD-10-CM | POA: Diagnosis not present

## 2016-07-05 DIAGNOSIS — M9905 Segmental and somatic dysfunction of pelvic region: Secondary | ICD-10-CM | POA: Diagnosis not present

## 2016-07-05 DIAGNOSIS — M5416 Radiculopathy, lumbar region: Secondary | ICD-10-CM | POA: Diagnosis not present

## 2016-07-05 DIAGNOSIS — M9903 Segmental and somatic dysfunction of lumbar region: Secondary | ICD-10-CM | POA: Diagnosis not present

## 2016-07-05 DIAGNOSIS — Z1231 Encounter for screening mammogram for malignant neoplasm of breast: Secondary | ICD-10-CM

## 2016-07-05 MED ORDER — AMLODIPINE BESYLATE 5 MG PO TABS: 5.0000 mg | ORAL_TABLET | Freq: Every day | ORAL | 1 refills | Status: DC

## 2016-07-05 NOTE — Progress Notes (Signed)
[facility] in [place] still has me listed at [facility]; please ask them to update their info so we get patient Rx requests; thank you

## 2016-07-06 NOTE — Progress Notes (Signed)
Called and had it updated.

## 2016-07-08 ENCOUNTER — Telehealth: Payer: Self-pay | Admitting: Family Medicine

## 2016-07-08 DIAGNOSIS — E785 Hyperlipidemia, unspecified: Secondary | ICD-10-CM

## 2016-07-08 DIAGNOSIS — I2510 Atherosclerotic heart disease of native coronary artery without angina pectoris: Secondary | ICD-10-CM

## 2016-07-08 DIAGNOSIS — Z5181 Encounter for therapeutic drug level monitoring: Secondary | ICD-10-CM

## 2016-07-08 NOTE — Assessment & Plan Note (Signed)
Check lipids 

## 2016-07-08 NOTE — Telephone Encounter (Signed)
Last sgpt and lipids reviewed; Rx approved; new orders entered Please ask patient to have fasting labs done in the next week or two, and please schedule a follow-up appt with me for a Medicare Wellness visit in the next month or two; she has lots of care gaps to address and close (bone density, immunizations, etc.)

## 2016-07-08 NOTE — Assessment & Plan Note (Signed)
Check lipids and sgpt

## 2016-07-08 NOTE — Assessment & Plan Note (Signed)
Check sgpt 

## 2016-07-12 NOTE — Telephone Encounter (Signed)
Appointment made for 08/04/16

## 2016-08-04 ENCOUNTER — Ambulatory Visit (INDEPENDENT_AMBULATORY_CARE_PROVIDER_SITE_OTHER): Payer: Medicare Other | Admitting: Family Medicine

## 2016-08-04 ENCOUNTER — Encounter: Payer: Self-pay | Admitting: Family Medicine

## 2016-08-04 VITALS — BP 122/82 | HR 94 | Temp 97.8°F | Resp 16 | Wt 190.0 lb

## 2016-08-04 DIAGNOSIS — I2510 Atherosclerotic heart disease of native coronary artery without angina pectoris: Secondary | ICD-10-CM | POA: Diagnosis not present

## 2016-08-04 DIAGNOSIS — E785 Hyperlipidemia, unspecified: Secondary | ICD-10-CM

## 2016-08-04 DIAGNOSIS — Z5181 Encounter for therapeutic drug level monitoring: Secondary | ICD-10-CM | POA: Diagnosis not present

## 2016-08-04 DIAGNOSIS — I719 Aortic aneurysm of unspecified site, without rupture: Secondary | ICD-10-CM

## 2016-08-04 DIAGNOSIS — Z23 Encounter for immunization: Secondary | ICD-10-CM

## 2016-08-04 DIAGNOSIS — I10 Essential (primary) hypertension: Secondary | ICD-10-CM

## 2016-08-04 NOTE — Patient Instructions (Addendum)
Please do consider weaning down on the estrogen to three days a week, then two days a week Try to limit saturated fats in your diet (bologna, hot dogs, barbeque, cheeseburgers, hamburgers, steak, bacon, sausage, cheese, etc.) and get more fresh fruits, vegetables, and whole grains We'll get labs today If you have not heard anything from my staff in a week about any orders/referrals/studies from today, please contact us here to follow-up [phone] You received the flu shot today; it should protect you against the flu virus over the coming months; it will take about two weeks for antibodies to develop; do try to stay away from hospitals, nursing homes, and daycares during peak flu season; taking extra vitamin C daily during flu season may help you avoid getting sick Try turmeric as a natural anti-inflammatory (for pain and arthritis). It comes in capsules where you buy aspirin and fish oil, but also as a spice where you buy pepper and garlic powder.

## 2016-08-04 NOTE — Progress Notes (Signed)
BP 122/82 (BP Location: Left Arm, Patient Position: Sitting, Cuff Size: Normal)   Pulse 94   Temp 97.8 F (36.6 C) (Oral)   Resp 16   Wt 190 lb (86.2 kg)   LMP  (LMP Unknown)   SpO2 93%   BMI 33.13 kg/m    Subjective:    Patient ID: Angel [name], female    DOB: October 02, 1930, 82 y.o.   MRN: [id number]  HPI: Angel [name] is a 82 y.o. female  Chief Complaint  Patient presents with  . Medication Refill  . Flu Vaccine   She has been doing well since last visit She has gotten flu shot today She has HTN; controlled today; on amloldipine and metoprolol and lisinopril; no fatigue She takes statin for lipids; no muscle aches She has arthritis, walking up steps is  Not easy; goes to PT twice a week; Merilyn Baba; helps muscles takes over with degenerative tissues; sees chiropractor once a month She takes imipramine only at bedtime She is on premarin; she is not interested in weaning down on that when asked directly; she has been informed about heart disease, breast cancer Aneurysm on her heart, sees Dr. Daneen Schick Sees dermatologist once a year  Depression screen [facility] 2/9 08/04/2016 01/05/2016 11/02/2015 08/04/2015 04/02/2015  Decreased Interest 0 0 0 0 0  Down, Depressed, Hopeless 0 0 0 0 0  PHQ - 2 Score 0 0 0 0 0   Relevant past medical, surgical, family and social history reviewed Past Medical History:  Diagnosis Date  . Anxiety   . Aortic aneurysm without rupture ([place])   . CAD (coronary artery disease)   . CAD in native artery   . Chronic hip pain   . Endometriosis   . Hearing loss   . HTN (hypertension)   . HTN, goal below 150/90   . Hyperlipidemia LDL goal <70 11/18/2014  . Osteopenia of the elderly   . Urge and stress incontinence    Past Surgical History:  Procedure Laterality Date  . ABDOMINAL HYSTERECTOMY  1976   total  . APPENDECTOMY    . BREAST EXCISIONAL BIOPSY Left 1987   benign   Family History  Problem Relation Age of Onset  . CVA Mother   .  Breast cancer Maternal Grandmother 69   Social History  Substance Use Topics  . Smoking status: Never Smoker  . Smokeless tobacco: Never Used  . Alcohol use 0.0 oz/week     Comment: occasional    Interim medical history since last visit reviewed. Allergies and medications reviewed  Review of Systems Per HPI unless specifically indicated above     Objective:    BP 122/82 (BP Location: Left Arm, Patient Position: Sitting, Cuff Size: Normal)   Pulse 94   Temp 97.8 F (36.6 C) (Oral)   Resp 16   Wt 190 lb (86.2 kg)   LMP  (LMP Unknown)   SpO2 93%   BMI 33.13 kg/m   Wt Readings from Last 3 Encounters:  08/04/16 190 lb (86.2 kg)  01/13/16 181 lb 11.2 oz (82.4 kg)  01/11/16 181 lb 3.2 oz (82.2 kg)    Physical Exam  Constitutional: She appears well-developed and well-nourished. No distress.  Cardiovascular: Normal rate and regular rhythm.   Pulmonary/Chest: Effort normal and breath sounds normal.  Abdominal: Normal appearance and bowel sounds are normal. She exhibits no distension.  Skin: No pallor.  Psychiatric: She has a normal mood and affect. Her mood appears not anxious.  She does not exhibit a depressed mood.      Assessment & Plan:   Problem List Items Addressed This Visit      Cardiovascular and Mediastinum   Hypertension goal BP (blood pressure) < 140/90 - Primary (Chronic)    Well-controlled today; continue regimen; monitor Cr and K+ on the ACE-I      Relevant Medications   lisinopril (PRINIVIL,ZESTRIL) 40 MG tablet   CAD (coronary artery disease) (Chronic)    Follow with cardiologist      Relevant Medications   lisinopril (PRINIVIL,ZESTRIL) 40 MG tablet   Aortic aneurysm without rupture ([facility])    Follow with cardiologist      Relevant Medications   lisinopril (PRINIVIL,ZESTRIL) 40 MG tablet     Other   Medication monitoring encounter    Monitor creatninine on the ACE-I and SGPT on statin (orders already in system)      Hyperlipidemia LDL goal  <70    Check lipids today fasting (orders already in system); limit saturated fats, weight loss, continue statin      Relevant Medications   lisinopril (PRINIVIL,ZESTRIL) 40 MG tablet    Other Visit Diagnoses    Needs flu shot       Relevant Orders   Flu vaccine HIGH DOSE PF (Fluzone High dose) (Completed)      Follow up plan: Return in about 6 months (around 02/02/2017) for fasting labs and visit.  An after-visit summary was printed and given to the patient at [facility].  Please see the patient instructions which may contain other information and recommendations beyond what is mentioned above in the assessment and plan.  Meds ordered this encounter  Medications  . lisinopril (PRINIVIL,ZESTRIL) 40 MG tablet    Sig: Take 1 tablet (40 mg total) by mouth 2 (two) times daily.    Orders Placed This Encounter  Procedures  . Flu vaccine HIGH DOSE PF (Fluzone High dose)

## 2016-08-05 LAB — LIPID PANEL
Cholesterol: 181 mg/dL (ref <200)
HDL: 41 mg/dL — ABNORMAL LOW (ref >50)
LDL Cholesterol: 70 mg/dL (ref <100)
Total CHOL/HDL Ratio: 4.4 Ratio (ref <5.0)
Triglycerides: 351 mg/dL — ABNORMAL HIGH (ref <150)
VLDL: 70 mg/dL — ABNORMAL HIGH (ref <30)

## 2016-08-05 LAB — COMPLETE METABOLIC PANEL WITH GFR
ALT: 28 U/L (ref 6–29)
AST: 25 U/L (ref 10–35)
Albumin: 3.9 g/dL (ref 3.6–5.1)
Alkaline Phosphatase: 44 U/L (ref 33–130)
BUN: 27 mg/dL — ABNORMAL HIGH (ref 7–25)
CO2: 27 mmol/L (ref 20–31)
Calcium: 9.1 mg/dL (ref 8.6–10.4)
Chloride: 104 mmol/L (ref 98–110)
Creat: 0.8 mg/dL (ref 0.60–0.88)
GFR, Est African American: 79 mL/min (ref >=60)
GFR, Est Non African American: 68 mL/min (ref >=60)
Glucose, Bld: 90 mg/dL (ref 65–99)
Potassium: 4.6 mmol/L (ref 3.5–5.3)
Sodium: 140 mmol/L (ref 135–146)
Total Bilirubin: 0.3 mg/dL (ref 0.2–1.2)
Total Protein: 6.7 g/dL (ref 6.1–8.1)

## 2016-08-09 DIAGNOSIS — M9905 Segmental and somatic dysfunction of pelvic region: Secondary | ICD-10-CM | POA: Diagnosis not present

## 2016-08-09 DIAGNOSIS — M5416 Radiculopathy, lumbar region: Secondary | ICD-10-CM | POA: Diagnosis not present

## 2016-08-09 DIAGNOSIS — M955 Acquired deformity of pelvis: Secondary | ICD-10-CM | POA: Diagnosis not present

## 2016-08-09 DIAGNOSIS — M9903 Segmental and somatic dysfunction of lumbar region: Secondary | ICD-10-CM | POA: Diagnosis not present

## 2016-08-14 ENCOUNTER — Encounter: Payer: Self-pay | Admitting: Family Medicine

## 2016-08-14 NOTE — Assessment & Plan Note (Signed)
Follow with cardiologist 

## 2016-08-14 NOTE — Assessment & Plan Note (Signed)
Well-controlled today; continue regimen; monitor Cr and K+ on the ACE-I

## 2016-08-14 NOTE — Assessment & Plan Note (Signed)
Monitor creatninine on the ACE-I and SGPT on statin (orders already in system)

## 2016-08-14 NOTE — Assessment & Plan Note (Addendum)
Check lipids today fasting (orders already in system); limit saturated fats, weight loss, continue statin

## 2016-08-15 ENCOUNTER — Ambulatory Visit: Payer: Medicare Other | Attending: Family Medicine

## 2016-09-07 ENCOUNTER — Other Ambulatory Visit: Payer: Self-pay

## 2016-09-07 DIAGNOSIS — Z79890 Hormone replacement therapy (postmenopausal): Secondary | ICD-10-CM

## 2016-09-08 MED ORDER — ESTROGENS CONJUGATED 0.625 MG PO TABS: ORAL_TABLET | ORAL | 0 refills | Status: DC

## 2016-09-08 NOTE — Telephone Encounter (Signed)
Last mammogram neg 2016; next scheduled Will recommend slight taper Let patient know that I'm tapering her dose just slightly; we'll hope to decrease her estrogen further next time

## 2016-09-20 ENCOUNTER — Ambulatory Visit: Payer: Medicare Other

## 2016-09-20 ENCOUNTER — Other Ambulatory Visit: Payer: Self-pay

## 2016-09-20 MED ORDER — LISINOPRIL 40 MG PO TABS: 40.0000 mg | ORAL_TABLET | Freq: Two times a day (BID) | ORAL | 5 refills | Status: DC

## 2016-09-20 NOTE — Telephone Encounter (Signed)
Cr and K+ Dec 2017 reviewed Rx approved

## 2016-09-28 ENCOUNTER — Other Ambulatory Visit: Payer: Self-pay

## 2016-09-28 MED ORDER — METOPROLOL SUCCINATE ER 50 MG PO TB24: 50.0000 mg | ORAL_TABLET | Freq: Every day | ORAL | 2 refills | Status: DC

## 2016-09-28 NOTE — Telephone Encounter (Signed)
Last BP and pulse reviewed; Rx approved through late November

## 2016-09-30 ENCOUNTER — Ambulatory Visit
Admission: RE | Admit: 2016-09-30 | Discharge: 2016-09-30 | Disposition: A | Discharge status: Home / Self Care | Payer: Medicare Other | Source: Ambulatory Visit | Attending: Family Medicine | Admitting: Family Medicine

## 2016-09-30 DIAGNOSIS — Z1231 Encounter for screening mammogram for malignant neoplasm of breast: Secondary | ICD-10-CM | POA: Diagnosis not present

## 2016-10-04 DIAGNOSIS — M955 Acquired deformity of pelvis: Secondary | ICD-10-CM | POA: Diagnosis not present

## 2016-10-04 DIAGNOSIS — M9905 Segmental and somatic dysfunction of pelvic region: Secondary | ICD-10-CM | POA: Diagnosis not present

## 2016-10-04 DIAGNOSIS — M5416 Radiculopathy, lumbar region: Secondary | ICD-10-CM | POA: Diagnosis not present

## 2016-10-04 DIAGNOSIS — M9903 Segmental and somatic dysfunction of lumbar region: Secondary | ICD-10-CM | POA: Diagnosis not present

## 2016-11-01 DIAGNOSIS — M9903 Segmental and somatic dysfunction of lumbar region: Secondary | ICD-10-CM | POA: Diagnosis not present

## 2016-11-01 DIAGNOSIS — M5416 Radiculopathy, lumbar region: Secondary | ICD-10-CM | POA: Diagnosis not present

## 2016-11-01 DIAGNOSIS — M9905 Segmental and somatic dysfunction of pelvic region: Secondary | ICD-10-CM | POA: Diagnosis not present

## 2016-11-01 DIAGNOSIS — M955 Acquired deformity of pelvis: Secondary | ICD-10-CM | POA: Diagnosis not present

## 2016-11-25 ENCOUNTER — Other Ambulatory Visit: Payer: Self-pay

## 2016-11-25 DIAGNOSIS — Z79890 Hormone replacement therapy (postmenopausal): Secondary | ICD-10-CM

## 2016-11-25 MED ORDER — ESTROGENS CONJUGATED 0.625 MG PO TABS: ORAL_TABLET | ORAL | 0 refills | Status: DC

## 2016-11-25 NOTE — Telephone Encounter (Signed)
Mammogram is up-to-date This is the first I have seen a request for HRT refill, so I'm not sure what the problem is I'll be glad to respond, but again this is the first time I'm seeing a request She's not due for a refill until first of May, so she shouldn't be out anyway Sending now

## 2016-11-25 NOTE — Telephone Encounter (Signed)
Pt asked for a refill for a week now.

## 2016-12-02 ENCOUNTER — Ambulatory Visit (INDEPENDENT_AMBULATORY_CARE_PROVIDER_SITE_OTHER): Payer: Medicare Other | Admitting: Family Medicine

## 2016-12-02 ENCOUNTER — Encounter: Payer: Self-pay | Admitting: Family Medicine

## 2016-12-02 VITALS — BP 120/78 | HR 108 | Temp 97.7°F | Resp 14 | Wt 185.0 lb

## 2016-12-02 DIAGNOSIS — A084 Viral intestinal infection, unspecified: Secondary | ICD-10-CM | POA: Diagnosis not present

## 2016-12-02 NOTE — Patient Instructions (Addendum)
Do check any eggs you have at home and any you plan to buy in relation to the recall Do try to hydrate and rest  Viral Gastroenteritis, Adult Viral gastroenteritis is also known as the stomach flu. This condition is caused by various viruses. These viruses can be passed from person to person very easily (are very contagious). This condition may affect your stomach, small intestine, and large intestine. It can cause sudden watery diarrhea, fever, and vomiting. Diarrhea and vomiting can make you feel weak and cause you to become dehydrated. You may not be able to keep fluids down. Dehydration can make you tired and thirsty, cause you to have a dry mouth, and decrease how often you urinate. Older adults and people with other diseases or a weak immune system are at higher risk for dehydration. It is important to replace the fluids that you lose from diarrhea and vomiting. If you become severely dehydrated, you may need to get fluids through an IV tube. What are the causes? Gastroenteritis is caused by various viruses, including rotavirus and norovirus. Norovirus is the most common cause in adults. You can get sick by eating food, drinking water, or touching a surface contaminated with one of these viruses. You can also get sick from sharing utensils or other personal items with an infected person. What increases the risk? This condition is more likely to develop in people:  Who have a weak defense system (immune system).  Who live with one or more children who are younger than 108 years old.  Who live in a nursing home.  Who go on cruise ships. What are the signs or symptoms? Symptoms of this condition start suddenly 1-2 days after exposure to a virus. Symptoms may last a few days or as long as a week. The most common symptoms are watery diarrhea and vomiting. Other symptoms include:  Fever.  Headache.  Fatigue.  Pain in the abdomen.  Chills.  Weakness.  Nausea.  Muscle aches.  Loss  of appetite. How is this diagnosed? This condition is diagnosed with a medical history and physical exam. You may also have a stool test to check for viruses or other infections. How is this treated? This condition typically goes away on its own. The focus of treatment is to restore lost fluids (rehydration). Your health care provider may recommend that you take an oral rehydration solution (ORS) to replace important salts and minerals (electrolytes) in your body. Severe cases of this condition may require giving fluids through an IV tube. Treatment may also include medicine to help with your symptoms. Follow these instructions at home: Follow instructions from your health care provider about how to care for yourself at home. Eating and drinking  Follow these recommendations as told by your health care provider:  Take an ORS. This is a drink that is sold at pharmacies and retail stores.  Drink clear fluids in small amounts as you are able. Clear fluids include water, ice chips, diluted fruit juice, and low-calorie sports drinks.  Eat bland, easy-to-digest foods in small amounts as you are able. These foods include bananas, applesauce, rice, lean meats, toast, and crackers.  Avoid fluids that contain a lot of sugar or caffeine, such as energy drinks, sports drinks, and soda.  Avoid alcohol.  Avoid spicy or fatty foods. General instructions    Drink enough fluid to keep your urine clear or pale yellow.  Wash your hands often. If soap and water are not available, use hand sanitizer.  Make  sure that all people in your household wash their hands well and often.  Take over-the-counter and prescription medicines only as told by your health care provider.  Rest at home while you recover.  Watch your condition for any changes.  Take a warm bath to relieve any burning or pain from frequent diarrhea episodes.  Keep all follow-up visits as told by your health care provider. This is  important. Contact a health care provider if:  You cannot keep fluids down.  Your symptoms get worse.  You have new symptoms.  You feel light-headed or dizzy.  You have muscle cramps. Get help right away if:  You have chest pain.  You feel extremely weak or you faint.  You see blood in your vomit.  Your vomit looks like coffee grounds.  You have bloody or black stools or stools that look like tar.  You have a severe headache, a stiff neck, or both.  You have a rash.  You have severe pain, cramping, or bloating in your abdomen.  You have trouble breathing or you are breathing very quickly.  Your heart is beating very quickly.  Your skin feels cold and clammy.  You feel confused.  You have pain when you urinate.  You have signs of dehydration, such as:  Dark urine, very little urine, or no urine.  Cracked lips.  Dry mouth.  Sunken eyes.  Sleepiness.  Weakness. This information is not intended to replace advice given to you by your health care provider. Make sure you discuss any questions you have with your health care provider. Document Released: 07/25/2005 Document Revised: 01/06/2016 Document Reviewed: 03/31/2015 Elsevier Interactive Patient Education  2017 [employer].

## 2016-12-02 NOTE — Progress Notes (Signed)
BP 120/78   Pulse (!) 108   Temp 97.7 F (36.5 C) (Oral)   Resp 14   Wt 185 lb (83.9 kg)   LMP  (LMP Unknown)   SpO2 94%   BMI 32.26 kg/m    Subjective:    Patient ID: Angel [name], female    DOB: 04-Apr-1931, 83 y.o.   MRN: [id number]  HPI: Angel [name] is a 83 y.o. female  Chief Complaint  Patient presents with  . Diarrhea    nausea and fatigue   Diarrhea   This is a new problem. The current episode started in the past 7 days. The problem occurs 5 to 10 times per day (5-7 x a day). The problem has been gradually improving. Diarrhea characteristics: no blood. The patient states that diarrhea does not awaken her from sleep. Associated symptoms include increased flatus and vomiting (one morening). Pertinent negatives include no chills or fever. Risk factors: around a lot of people, does workshops and retreats, but who knows. Treatments tried: imodium. The treatment provided moderate relief.  She does eat eggs, and is aware of the lettuce recall but not the egg recall She is actually much better, but wanted to get checked out at [facility] suggestion  Depression screen [facility] 2/9 12/02/2016 08/04/2016 01/05/2016 11/02/2015 08/04/2015  Decreased Interest 0 0 0 0 0  Down, Depressed, Hopeless 0 0 0 0 0  PHQ - 2 Score 0 0 0 0 0   Relevant past medical, surgical, family and social history reviewed Past Medical History:  Diagnosis Date  . Anxiety   . Aortic aneurysm without rupture ([place])   . CAD (coronary artery disease)   . CAD in native artery   . Chronic hip pain   . Endometriosis   . Hearing loss   . HTN (hypertension)   . HTN, goal below 150/90   . Hyperlipidemia LDL goal <70 11/18/2014  . Osteopenia of the elderly   . Urge and stress incontinence    Past Surgical History:  Procedure Laterality Date  . ABDOMINAL HYSTERECTOMY  1976   total  . APPENDECTOMY    . BREAST EXCISIONAL BIOPSY Left 1987   benign   Family History  Problem Relation Age of Onset  . CVA Mother     . Breast cancer Maternal Grandmother 39   Social History  Substance Use Topics  . Smoking status: Never Smoker  . Smokeless tobacco: Never Used  . Alcohol use 0.0 oz/week     Comment: occasional   Interim medical history since last visit reviewed. Allergies and medications reviewed  Review of Systems  Constitutional: Negative for chills and fever.  Gastrointestinal: Positive for diarrhea, flatus and vomiting (one morening).   Per HPI unless specifically indicated above     Objective:    BP 120/78   Pulse (!) 108   Temp 97.7 F (36.5 C) (Oral)   Resp 14   Wt 185 lb (83.9 kg)   LMP  (LMP Unknown)   SpO2 94%   BMI 32.26 kg/m   Wt Readings from Last 3 Encounters:  12/02/16 185 lb (83.9 kg)  08/04/16 190 lb (86.2 kg)  01/13/16 181 lb 11.2 oz (82.4 kg)    Physical Exam  Constitutional: She appears well-developed and well-nourished.  HENT:  Mouth/Throat: Mucous membranes are normal.  Eyes: EOM are normal. No scleral icterus.  Cardiovascular: Regular rhythm.   No extrasystoles are present. Tachycardia present.   Pulmonary/Chest: Effort normal and breath sounds normal.  Abdominal: Soft. Bowel sounds are normal. She exhibits no distension. There is no hepatosplenomegaly. There is no tenderness. There is no rigidity and no guarding.  No high-pitched tinkling  Skin: She is not diaphoretic. No pallor.  No jaundice  Psychiatric: She has a normal mood and affect. Her behavior is normal. Her mood appears not anxious. She does not exhibit a depressed mood.      Assessment & Plan:   Problem List Items Addressed This Visit    None    Visit Diagnoses    Viral gastroenteritis    -  Primary   with mild dehydration suspected from mild tachycardia, encouraged hydration; no red flags; list of eggs recalled from web site given to patient       Follow up plan: No Follow-up on file.  An after-visit summary was printed and given to the patient at [facility].  Please see the  patient instructions which may contain other information and recommendations beyond what is mentioned above in the assessment and plan.  No orders of the defined types were placed in this encounter.   No orders of the defined types were placed in this encounter.

## 2016-12-05 ENCOUNTER — Ambulatory Visit: Payer: Medicare Other | Admitting: Family Medicine

## 2016-12-20 DIAGNOSIS — M9903 Segmental and somatic dysfunction of lumbar region: Secondary | ICD-10-CM | POA: Diagnosis not present

## 2016-12-20 DIAGNOSIS — M9905 Segmental and somatic dysfunction of pelvic region: Secondary | ICD-10-CM | POA: Diagnosis not present

## 2016-12-20 DIAGNOSIS — M955 Acquired deformity of pelvis: Secondary | ICD-10-CM | POA: Diagnosis not present

## 2016-12-20 DIAGNOSIS — M5416 Radiculopathy, lumbar region: Secondary | ICD-10-CM | POA: Diagnosis not present

## 2016-12-26 ENCOUNTER — Encounter: Payer: Self-pay | Admitting: Family Medicine

## 2016-12-26 ENCOUNTER — Ambulatory Visit (INDEPENDENT_AMBULATORY_CARE_PROVIDER_SITE_OTHER): Payer: Medicare Other | Admitting: Family Medicine

## 2016-12-26 VITALS — BP 132/74 | HR 75 | Temp 98.1°F | Resp 16 | Ht 64.0 in | Wt 186.3 lb

## 2016-12-26 DIAGNOSIS — W57XXXA Bitten or stung by nonvenomous insect and other nonvenomous arthropods, initial encounter: Secondary | ICD-10-CM | POA: Diagnosis not present

## 2016-12-26 DIAGNOSIS — L03312 Cellulitis of back [any part except buttock]: Secondary | ICD-10-CM

## 2016-12-26 DIAGNOSIS — S30860A Insect bite (nonvenomous) of lower back and pelvis, initial encounter: Secondary | ICD-10-CM | POA: Diagnosis not present

## 2016-12-26 MED ORDER — DOXYCYCLINE HYCLATE 100 MG PO CAPS: 100.0000 mg | ORAL_CAPSULE | Freq: Two times a day (BID) | ORAL | 0 refills | Status: AC

## 2016-12-26 NOTE — Progress Notes (Addendum)
Name: Angel [name]   MRN: [id number]    DOB: 1931-02-23   Date:12/26/2016       Progress Note  Subjective  Chief Complaint  Chief Complaint  Patient presents with  . Insect Bite    tick bite on back    HPI  Pt presents with c/o tick bite that occurred 3 days ago on the upper left side of her back - tick was pulled off of her at that time, has had increased itching and redness since then. No body aches, arthralgias/myalgias, NVD, no drainage from the site.  Patient Active Problem List   Diagnosis Date Noted  . Medication monitoring encounter 12/30/2015  . Productive cough 11/02/2015  . Skin lesion 08/04/2015  . 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 Hip pain, chronic 04/02/2015  . Hearing loss of both ears 04/02/2015  . Osteopenia of the elderly 04/02/2015  . Hormone replacement therapy (postmenopausal) 04/02/2015  . Aortic aneurysm without rupture ([place]) 04/02/2015  . Situational anxiety 04/02/2015  . Dermatitis 04/02/2015  . Hyperlipidemia LDL goal <70 11/18/2014  . Incomplete bladder emptying 06/29/2014  . Mixed stress and urge urinary incontinence 06/29/2014  . Urge incontinence of urine 06/29/2014  . Hypertension goal BP (blood pressure) < 140/90 11/06/2013  . CAD (coronary artery disease) 11/06/2013    Social History  Substance Use Topics  . Smoking status: Never Smoker  . Smokeless tobacco: Never Used  . Alcohol use 0.0 oz/week     Comment: occasional     Current Outpatient Prescriptions:  .  amLODipine (NORVASC) 5 MG tablet, Take 1 tablet (5 mg total) by mouth daily., Disp: 90 tablet, Rfl: 1 .  atorvastatin (LIPITOR) 40 MG tablet, TAKE ONE (1) TABLET BY MOUTH EVERY NIGHTAT BEDTIME (REPLACES SIMVASTATIN), Disp: 90 tablet, Rfl: 1 .  clobetasol cream (TEMOVATE) 2.44 %, Apply 1 application topically 2 (two) times daily. Too strong for face, underarms, groin, Disp: 30 g, Rfl: 1 .  estrogens, conjugated, (PREMARIN) 0.625 MG tablet, Take one by mouth three days per week  (Mon-Wed-Fri), Disp: 39 tablet, Rfl: 0 .  imipramine (TOFRANIL) 25 MG tablet, Take 25 mg by mouth at bedtime., Disp: , Rfl:  .  lisinopril (PRINIVIL,ZESTRIL) 40 MG tablet, Take 1 tablet (40 mg total) by mouth 2 (two) times daily., Disp: 60 tablet, Rfl: 5 .  metoprolol succinate (TOPROL-XL) 50 MG 24 hr tablet, Take 1 tablet (50 mg total) by mouth daily. Take with or immediately following a meal., Disp: 90 tablet, Rfl: 2 .  doxycycline (VIBRAMYCIN) 100 MG capsule, Take 1 capsule (100 mg total) by mouth 2 (two) times daily., Disp: 14 capsule, Rfl: 0  Allergies  Allergen Reactions  . Codeine Other (See Comments)    "zoned out"  . Levofloxacin Other (See Comments)    AMS  . Prednisone Other (See Comments)    AMS  . Sulfa Antibiotics Other (See Comments)    AMS    ROS  Constitutional: Negative for fever, night sweats, or weight change.  Respiratory: Negative for cough and shortness of breath.   Cardiovascular: Negative for chest pain or palpitations.  Gastrointestinal: Negative for abdominal pain, no bowel changes.  Musculoskeletal: Negative for gait problem or joint swelling/pain.  Skin: Negative for rash.  Positive  Neurological: Negative for dizziness or headache.  No other specific complaints in a complete review of systems (except as listed in HPI above).  Objective  Vitals:   12/26/16 1136  BP: 132/74  Pulse: 75  Resp: 16  Temp: 98.1  F (36.7 C)  TempSrc: Oral  SpO2: 96%  Weight: 186 lb 4.8 oz (84.5 kg)  Height: 5\' 4"  (1.626 m)    Body mass index is 31.98 kg/m.  Nursing Note and Vital Signs reviewed.  Physical Exam  Skin:       Skin: 6cm diameter of erythema (No erythema migrans present) with 1cm hardened white center to left upper back no fluctuance, no tenderness. Constitutional: Patient appears well-developed and well-nourished. Obese No distress.  HEENT: head atraumatic, normocephalic Cardiovascular: Normal rate, regular rhythm, S1/S2 present.  No murmur  or rub heard. No BLE edema. Pulmonary/Chest: Effort normal and breath sounds clear. No respiratory distress or retractions. Psychiatric: Patient has a normal mood and affect. behavior is normal. Judgment and thought content normal.  No results found for this or any previous visit (from the past 2160 hour(s)).   Assessment & Plan  1. Tick bite of back, initial encounter  - doxycycline (VIBRAMYCIN) 100 MG capsule; Take 1 capsule (100 mg total) by mouth 2 (two) times daily.  Dispense: 14 capsule; Refill: 0 - Discussed signs and symptoms of RMSF and Lyme disease at length with patient - she is not experiencing symptoms at this time, but is advised to call/RTC if these do occur. She is very agreeable to this plan of care.  2. Cellulitis of back  - doxycycline (VIBRAMYCIN) 100 MG capsule; Take 1 capsule (100 mg total) by mouth 2 (two) times daily.  Dispense: 14 capsule; Refill: 0 -Warm compresses discussed.  -Red flags and when to present for emergency care or RTC including fever >101.20F, chest pain, shortness of breath, new/worsening/un-resolving symptoms, nigh sweats, myalgias/arthralgias, or rash reviewed with patient at time of visit. Follow up and care instructions discussed and provided in AVS. -Reviewed Health Maintenance: Will schedule chronic problem follow up for September as planned with Dr. Sanda Klein at her last visit.  I have reviewed this encounter including the documentation in this note and/or discussed this patient with the Johney Maine, FNP, NP-C. I am certifying that I agree with the content of this note as supervising physician.  Steele Sizer, MD [facility] 12/26/2016, 1:11 PM

## 2016-12-26 NOTE — Patient Instructions (Addendum)
Cellulitis, Adult Cellulitis is a skin infection. The infected area is usually red and sore. This condition occurs most often in the arms and lower legs. It is very important to get treated for this condition. Follow these instructions at home:  Take over-the-counter and prescription medicines only as told by your doctor.  If you were prescribed an antibiotic medicine, take it as told by your doctor. Do not stop taking the antibiotic even if you start to feel better.  Drink enough fluid to keep your pee (urine) clear or pale yellow.  Do not touch or rub the infected area.  Raise (elevate) the infected area above the level of your heart while you are sitting or lying down.  Place warm or cold wet cloths (warm or cold compresses) on the infected area. Do this as told by your doctor.  Keep all follow-up visits as told by your doctor. This is important. These visits let your doctor make sure your infection is not getting worse. Contact a doctor if:  You have a fever.  Your symptoms do not get better after 1-2 days of treatment.  Your bone or joint under the infected area starts to hurt after the skin has healed.  Your infection comes back. This can happen in the same area or another area.  You have a swollen bump in the infected area.  You have new symptoms.  You feel ill and also have muscle aches and pains. Get help right away if:  Your symptoms get worse.  You feel very sleepy.  You throw up (vomit) or have watery poop (diarrhea) for a long time.  There are red streaks coming from the infected area.  Your red area gets larger.  Your red area turns darker. This information is not intended to replace advice given to you by your health care provider. Make sure you discuss any questions you have with your health care provider. Document Released: 01/11/2008 Document Revised: 12/31/2015 Document Reviewed: 06/03/2015 Elsevier Interactive Patient Education  2017 [place], Adult An insect bite can make your skin red, itchy, and swollen. Some insects can spread disease to people with a bite. However, most insect bites do not lead to disease, and most are not serious. Follow these instructions at home: Bite area care   Do not scratch the bite area.  Keep the bite area clean and dry.  Wash the bite area every day with soap and water as told by your doctor.  Check the bite area every day for signs of infection. Check for:  More redness, swelling, or pain.  Fluid or blood.  Warmth.  Pus. Managing pain, itching, and swelling   You may put any of these on the bite area as told by your doctor:  A baking soda paste.  Cortisone cream.  Calamine lotion.  If directed, put ice on the bite area.  Put ice in a plastic bag.  Place a towel between your skin and the bag.  Leave the ice on for 20 minutes, 2-3 times a day. Medicines   Take medicines or put medicines on your skin only as told by your doctor.  If you were prescribed an antibiotic medicine, use it as told by your doctor. Do not stop using the antibiotic even if your condition improves. General instructions   Keep all follow-up visits as told by your doctor. This is important. How is this prevented? To help you have a lower risk of insect bites:  When you  are outside, wear clothing that covers your arms and legs.  Use insect repellent. The best insect repellents have:  An active ingredient of DEET, picaridin, oil of lemon eucalyptus (OLE), or IR3535.  Higher amounts of DEET or another active ingredient than other repellents have.  If your home windows do not have screens, think about putting some in. Contact a doctor if:  You have more redness, swelling, or pain in the bite area.  You have fluid, blood, or pus coming from the bite area.  The bite area feels warm.  You have a fever. Get help right away if:  You have joint pain.  You have a rash.  You  have shortness of breath.  You feel more tired or sleepy than you normally do.  You have neck pain.  You have a headache.  You feel weaker than you normally do.  You have chest pain.  You have pain in your belly.  You feel sick to your stomach (nauseous) or you throw up (vomit). Summary  An insect bite can make your skin red, itchy, and swollen.  Do not scratch the bite area, and keep it clean and dry.  Ice can help with pain and itching from the bite. This information is not intended to replace advice given to you by your health care provider. Make sure you discuss any questions you have with your health care provider. Document Released: 07/22/2000 Document Revised: 02/25/2016 Document Reviewed: 12/10/2014 Elsevier Interactive Patient Education  2017 [employer].

## 2016-12-29 ENCOUNTER — Other Ambulatory Visit: Payer: Self-pay

## 2016-12-29 MED ORDER — AMLODIPINE BESYLATE 5 MG PO TABS: 5.0000 mg | ORAL_TABLET | Freq: Every day | ORAL | 1 refills | Status: DC

## 2017-01-03 ENCOUNTER — Other Ambulatory Visit: Payer: Self-pay | Admitting: Family Medicine

## 2017-01-04 NOTE — Telephone Encounter (Signed)
Dec 2017 SGPT reviewed Rx approved

## 2017-01-17 DIAGNOSIS — M9903 Segmental and somatic dysfunction of lumbar region: Secondary | ICD-10-CM | POA: Diagnosis not present

## 2017-01-17 DIAGNOSIS — M9905 Segmental and somatic dysfunction of pelvic region: Secondary | ICD-10-CM | POA: Diagnosis not present

## 2017-01-17 DIAGNOSIS — M955 Acquired deformity of pelvis: Secondary | ICD-10-CM | POA: Diagnosis not present

## 2017-01-17 DIAGNOSIS — M5416 Radiculopathy, lumbar region: Secondary | ICD-10-CM | POA: Diagnosis not present

## 2017-01-18 NOTE — Progress Notes (Signed)
Cardiology Office Note    Date:  01/19/2017   ID:  [name], Angel 1931-04-01, MRN [id number]  PCP:  Arnetha Courser, MD  Cardiologist: Sinclair Grooms, MD   Chief Complaint  Patient presents with  . Coronary Artery Disease  . Thoracic Aortic Aneurysm    History of Present Illness:  Angel [name] is a 83 y.o. female poor R-wave progression on EKG, prior history of abnormal myocardial perfusion study, question of small apical infarct or diverticulum by cardiac MRI, hypertension, and hyperlipidemia. Aortic enlargement.  Angel [name] for end-of-life. She is under stress now concerning instructions and land designation to relatives at her death. There is no illnesses that precipitates this, this is purely appropriate planning.  She denies chest is comfort on exertion but occasionally when under emotional stress will have a soreness in the midsternal area as if someone "threw a baseball into her chest". These episodes may last several minutes and then resolved. Intensity is quite low. She denies palpitations. She has not had syncope.   Past Medical History:  Diagnosis Date  . Anxiety   . Aortic aneurysm without rupture ([place])   . CAD (coronary artery disease)   . Chronic hip pain   . Endometriosis   . Hearing loss   . HTN, goal below 150/90   . Hyperlipidemia LDL goal <70 11/18/2014  . Osteopenia of the elderly   . Urge and stress incontinence     Past Surgical History:  Procedure Laterality Date  . ABDOMINAL HYSTERECTOMY  1976   total  . APPENDECTOMY    . BREAST EXCISIONAL BIOPSY Left 1987   benign    Current Medications: Outpatient Medications Prior to Visit  Medication Sig Dispense Refill  . clobetasol cream (TEMOVATE) 7.62 % Apply 1 application topically 2 (two) times daily. Too strong for face, underarms, groin 30 g 1  . estrogens, conjugated, (PREMARIN) 0.625 MG tablet Take one by mouth three days per week (Mon-Wed-Fri) 39 tablet 0  . imipramine  (TOFRANIL) 25 MG tablet Take 25 mg by mouth at bedtime.    [name] amLODipine (NORVASC) 5 MG tablet Take 1 tablet (5 mg total) by mouth daily. 90 tablet 1  . atorvastatin (LIPITOR) 40 MG tablet Take 1 tablet (40 mg total) by mouth at bedtime. 90 tablet 1  . lisinopril (PRINIVIL,ZESTRIL) 40 MG tablet Take 1 tablet (40 mg total) by mouth 2 (two) times daily. 60 tablet 5  . metoprolol succinate (TOPROL-XL) 50 MG 24 hr tablet Take 1 tablet (50 mg total) by mouth daily. Take with or immediately following a meal. 90 tablet 2   No facility-administered medications prior to visit.      Allergies:   Codeine; Levofloxacin; Prednisone; and Sulfa antibiotics   Social History   Social History  . Marital status: Widowed    Spouse name: N/A  . Number of children: N/A  . Years of education: N/A   Social History Main Topics  . Smoking status: Never Smoker  . Smokeless tobacco: Never Used  . Alcohol use 0.0 oz/week     Comment: occasional  . Drug use: No  . Sexual activity: No   Other Topics Concern  . None   Social History Narrative  . None     Family History:  The patient's family history includes Breast cancer (age of onset: 70) in her maternal grandmother; CVA in her mother.   ROS:   Please see the history of present illness.  Hearing loss, easy bruising, difficulty with balance and walking.  All other systems reviewed and are negative.   PHYSICAL EXAM:   VS:  BP 116/76 (BP Location: Right Arm)   Pulse 70   Ht 5\' 4"  (1.626 m)   Wt 187 lb 6.4 oz (85 kg)   LMP  (LMP Unknown)   BMI 32.17 kg/m    GEN: Well nourished, well developed, in no acute distress  HEENT: normal  Neck: no JVD, carotid bruits, or masses Cardiac: RRR, rubs, or gallops,no edema. 2/6 left midsternal border systolic murmur. Respiratory:  clear to auscultation bilaterally, normal work of breathing GI: soft, nontender, nondistended, + BS MS: no deformity or atrophy  Skin: warm and dry, no rash Neuro:  Alert and  Oriented x 3, Strength and sensation are intact Psych: euthymic mood, full affect  Wt Readings from Last 3 Encounters:  01/19/17 187 lb 6.4 oz (85 kg)  12/26/16 186 lb 4.8 oz (84.5 kg)  12/02/16 185 lb (83.9 kg)      Studies/Labs Reviewed:   EKG:  EKG  Normal sinus rhythm, low voltage, inferior T-wave abnormality, poor wave progression. When compared to the prior most recent study from 01/11/2016, no changes noted.  Recent Labs: 08/04/2016: ALT 28; BUN 27; Creat 0.80; Potassium 4.6; Sodium 140   Lipid Panel    Component Value Date/Time   CHOL 181 08/04/2016 1601   CHOL 170 01/06/2016 0839   TRIG 351 (H) 08/04/2016 1601   HDL 41 (L) 08/04/2016 1601   HDL 53 01/06/2016 0839   CHOLHDL 4.4 08/04/2016 1601   VLDL 70 (H) 08/04/2016 1601   LDLCALC 70 08/04/2016 1601   LDLCALC 77 01/06/2016 0839    Additional studies/ records that were reviewed today include:  No new studies. No evaluation of aortic root.    ASSESSMENT:    1. Coronary artery disease involving native coronary artery of native heart without angina pectoris   2. Aortic aneurysm without rupture, unspecified portion of aorta ([place])   3. Hypertension goal BP (blood pressure) < 140/90      PLAN:  In order of problems listed above:  1. Asymptomatic. Episodes of chest discomfort are described and will be followed. They a nonexertional. She will notify me if there seems to be progression. 2. An echocardiogram will be done to assess aortic size and systolic murmur/mild aortic stenosis. 3. Adequate blood pressure control on beta blocker, ACE, and amlodipine.  Continue physical activity. Clinical follow-up in one year. 2-D Doppler echocardiogram to assess aortic valve and measure ascending aorta.  Medication Adjustments/Labs and Tests Ordered: Current medicines are reviewed at length with the patient today.  Concerns regarding medicines are outlined above.  Medication changes, Labs and Tests ordered today are listed  in the Patient Instructions below. Patient Instructions  Medication Instructions:  None  Labwork: None  Testing/Procedures: Your physician has requested that you have an echocardiogram. Echocardiography is a painless test that uses sound waves to create images of your heart. It provides your doctor with information about the size and shape of your heart and how well your heart's chambers and valves are working. This procedure takes approximately one hour. There are no restrictions for this procedure.   Follow-Up: Your physician wants you to follow-up in: 1 year with Dr. Tamala Julian.  You will receive a reminder letter in the mail two months in advance. If you don't receive a letter, please call our office to schedule the follow-up appointment.   Any Other Special Instructions  Will Be Listed Below (If Applicable).     If you need a refill on your cardiac medications before your next appointment, please call your pharmacy.      Signed, Sinclair Grooms, MD  01/19/2017 12:14 PM    [facility] [address], [place], [place]  [address] Phone: [phone]; Fax: [phone]

## 2017-01-19 ENCOUNTER — Encounter: Payer: Self-pay | Admitting: Interventional Cardiology

## 2017-01-19 ENCOUNTER — Encounter (INDEPENDENT_AMBULATORY_CARE_PROVIDER_SITE_OTHER): Payer: Self-pay

## 2017-01-19 ENCOUNTER — Ambulatory Visit (INDEPENDENT_AMBULATORY_CARE_PROVIDER_SITE_OTHER): Payer: Medicare Other | Admitting: Interventional Cardiology

## 2017-01-19 VITALS — BP 116/76 | HR 70 | Ht 64.0 in | Wt 187.4 lb

## 2017-01-19 DIAGNOSIS — I2510 Atherosclerotic heart disease of native coronary artery without angina pectoris: Secondary | ICD-10-CM | POA: Diagnosis not present

## 2017-01-19 DIAGNOSIS — I10 Essential (primary) hypertension: Secondary | ICD-10-CM

## 2017-01-19 DIAGNOSIS — I719 Aortic aneurysm of unspecified site, without rupture: Secondary | ICD-10-CM | POA: Diagnosis not present

## 2017-01-19 MED ORDER — METOPROLOL SUCCINATE ER 50 MG PO TB24: 50.0000 mg | ORAL_TABLET | Freq: Every day | ORAL | 3 refills | Status: DC

## 2017-01-19 MED ORDER — LISINOPRIL 40 MG PO TABS: 40.0000 mg | ORAL_TABLET | Freq: Two times a day (BID) | ORAL | 3 refills | Status: DC

## 2017-01-19 MED ORDER — ATORVASTATIN CALCIUM 40 MG PO TABS: 40.0000 mg | ORAL_TABLET | Freq: Every day | ORAL | 3 refills | Status: DC

## 2017-01-19 MED ORDER — AMLODIPINE BESYLATE 5 MG PO TABS: 5.0000 mg | ORAL_TABLET | Freq: Every day | ORAL | 3 refills | Status: DC

## 2017-01-19 NOTE — Patient Instructions (Signed)
Medication Instructions:  None  Labwork: None  Testing/Procedures: Your physician has requested that you have an echocardiogram. Echocardiography is a painless test that uses sound waves to create images of your heart. It provides your doctor with information about the size and shape of your heart and how well your heart's chambers and valves are working. This procedure takes approximately one hour. There are no restrictions for this procedure.   Follow-Up: Your physician wants you to follow-up in: 1 year with Dr. Smith.  You will receive a reminder letter in the mail two months in advance. If you don't receive a letter, please call our office to schedule the follow-up appointment.   Any Other Special Instructions Will Be Listed Below (If Applicable).     If you need a refill on your cardiac medications before your next appointment, please call your pharmacy.   

## 2017-02-02 ENCOUNTER — Other Ambulatory Visit: Payer: Self-pay

## 2017-02-02 ENCOUNTER — Ambulatory Visit (HOSPITAL_COMMUNITY): Payer: Medicare Other | Attending: Cardiology

## 2017-02-02 DIAGNOSIS — I10 Essential (primary) hypertension: Secondary | ICD-10-CM | POA: Diagnosis not present

## 2017-02-02 DIAGNOSIS — E785 Hyperlipidemia, unspecified: Secondary | ICD-10-CM | POA: Diagnosis not present

## 2017-02-02 DIAGNOSIS — I083 Combined rheumatic disorders of mitral, aortic and tricuspid valves: Secondary | ICD-10-CM | POA: Insufficient documentation

## 2017-02-02 DIAGNOSIS — F419 Anxiety disorder, unspecified: Secondary | ICD-10-CM | POA: Insufficient documentation

## 2017-02-02 DIAGNOSIS — I719 Aortic aneurysm of unspecified site, without rupture: Secondary | ICD-10-CM

## 2017-02-02 DIAGNOSIS — I2510 Atherosclerotic heart disease of native coronary artery without angina pectoris: Secondary | ICD-10-CM | POA: Diagnosis not present

## 2017-02-19 ENCOUNTER — Other Ambulatory Visit: Payer: Self-pay | Admitting: Family Medicine

## 2017-02-19 DIAGNOSIS — Z79890 Hormone replacement therapy (postmenopausal): Secondary | ICD-10-CM

## 2017-02-20 NOTE — Telephone Encounter (Signed)
mammo Feb 2018

## 2017-03-16 DIAGNOSIS — I788 Other diseases of capillaries: Secondary | ICD-10-CM | POA: Diagnosis not present

## 2017-04-11 ENCOUNTER — Other Ambulatory Visit: Payer: Self-pay

## 2017-04-11 MED ORDER — CLOBETASOL PROPIONATE 0.05 % EX CREA: 1.0000 "application " | TOPICAL_CREAM | Freq: Two times a day (BID) | CUTANEOUS | 0 refills | Status: DC

## 2017-05-08 DIAGNOSIS — M62830 Muscle spasm of back: Secondary | ICD-10-CM | POA: Diagnosis not present

## 2017-05-08 DIAGNOSIS — M9901 Segmental and somatic dysfunction of cervical region: Secondary | ICD-10-CM | POA: Diagnosis not present

## 2017-05-08 DIAGNOSIS — M5033 Other cervical disc degeneration, cervicothoracic region: Secondary | ICD-10-CM | POA: Diagnosis not present

## 2017-05-11 ENCOUNTER — Other Ambulatory Visit: Payer: Self-pay | Admitting: Family Medicine

## 2017-05-11 DIAGNOSIS — Z79890 Hormone replacement therapy (postmenopausal): Secondary | ICD-10-CM

## 2017-05-11 NOTE — Telephone Encounter (Signed)
Thank you :)

## 2017-05-11 NOTE — Telephone Encounter (Signed)
Patient states you have already decreased from every other day to just 3x a week and she would like to not change at this time.

## 2017-05-11 NOTE — Telephone Encounter (Signed)
Mammogram UTD [name], Please ask patient if she is willing to decrease her estrogen some Long-term estrogen therapy carries risks I would be in favor of decreasing her total weekly dose from 19.5 mg to 12 mg (switching to 3 mg pill and having her take that 4 days a week) Let me know her thoughts please

## 2017-06-01 ENCOUNTER — Encounter: Payer: Self-pay | Admitting: Family Medicine

## 2017-06-01 ENCOUNTER — Ambulatory Visit (INDEPENDENT_AMBULATORY_CARE_PROVIDER_SITE_OTHER): Payer: Medicare Other | Admitting: Family Medicine

## 2017-06-01 VITALS — BP 114/58 | HR 80 | Temp 97.4°F | Resp 14 | Wt 189.2 lb

## 2017-06-01 DIAGNOSIS — Z79890 Hormone replacement therapy (postmenopausal): Secondary | ICD-10-CM

## 2017-06-01 DIAGNOSIS — N3946 Mixed incontinence: Secondary | ICD-10-CM | POA: Diagnosis not present

## 2017-06-01 DIAGNOSIS — I10 Essential (primary) hypertension: Secondary | ICD-10-CM

## 2017-06-01 DIAGNOSIS — E785 Hyperlipidemia, unspecified: Secondary | ICD-10-CM | POA: Diagnosis not present

## 2017-06-01 DIAGNOSIS — Z23 Encounter for immunization: Secondary | ICD-10-CM

## 2017-06-01 DIAGNOSIS — I719 Aortic aneurysm of unspecified site, without rupture: Secondary | ICD-10-CM | POA: Diagnosis not present

## 2017-06-01 DIAGNOSIS — I2510 Atherosclerotic heart disease of native coronary artery without angina pectoris: Secondary | ICD-10-CM | POA: Diagnosis not present

## 2017-06-01 NOTE — Patient Instructions (Addendum)
Please consider the shingles vaccine at your local pharmacy (Shingrix); it is a two part series, so you'll get one shot and then get one booster 2-6 months later Sometime soon, please return fasting for labs and we'll check your cholesterol Please do let Dr. Tamala Julian know that your blood pressure today was 114/58, as he may want to adjust your blood pressure medicine Try to limit saturated fats in your diet (bologna, hot dogs, barbeque, cheeseburgers, hamburgers, steak, bacon, sausage, cheese, etc.) and get more fresh fruits, vegetables, and whole grains    Recombinant Zoster (Shingles) Vaccine, RZV: What You Need to Know 1. Why get vaccinated? Shingles (also called herpes zoster, or just zoster) is a painful skin rash, often with blisters. Shingles is caused by the varicella zoster virus, the same virus that causes chickenpox. After you have chickenpox, the virus stays in your body and can cause shingles later in life. You can't catch shingles from another person. However, a person who has never had chickenpox (or chickenpox vaccine) could get chickenpox from someone with shingles. A shingles rash usually appears on one side of the face or body and heals within 2 to 4 weeks. Its main symptom is pain, which can be severe. Other symptoms can include fever, headache, chills and upset stomach. Very rarely, a shingles infection can lead to pneumonia, hearing problems, blindness, brain inflammation (encephalitis), or death. For about 1 person in 5, severe pain can continue even long after the rash has cleared up. This long-lasting pain is called post-herpetic neuralgia (PHN). Shingles is far more common in people 60 years of age and older than in younger people, and the risk increases with age. It is also more common in people whose immune system is weakened because of a disease such as cancer, or by drugs such as steroids or chemotherapy. At least 1 million people a year in the [place] get shingles. 2.  Shingles vaccine (recombinant) Recombinant shingles vaccine was approved by FDA in 2017 for the prevention of shingles. In clinical trials, it was more than 90% effective in preventing shingles. It can also reduce the likelihood of PHN. Two doses, 2 to 6 months apart, are recommended for adults 22 and older. This vaccine is also recommended for people who have already gotten the live shingles vaccine (Zostavax). There is no live virus in this vaccine. 3. Some people should not get this vaccine Tell your vaccine provider if you:  Have any severe, life-threatening allergies. A person who has ever had a life-threatening allergic reaction after a dose of recombinant shingles vaccine, or has a severe allergy to any component of this vaccine, may be advised not to be vaccinated. Ask your health care provider if you want information about vaccine components.  Are pregnant or breastfeeding. There is not much information about use of recombinant shingles vaccine in pregnant or nursing women. Your healthcare provider might recommend delaying vaccination.  Are not feeling well. If you have a mild illness, such as a cold, you can probably get the vaccine today. If you are moderately or severely ill, you should probably wait until you recover. Your doctor can advise you.  4. Risks of a vaccine reaction With any medicine, including vaccines, there is a chance of reactions. After recombinant shingles vaccination, a person might experience:  Pain, redness, soreness, or swelling at the site of the injection  Headache, muscle aches, fever, shivering, fatigue  In clinical trials, most people got a sore arm with mild or moderate pain after  vaccination, and some also had redness and swelling where they got the shot. Some people felt tired, had muscle pain, a headache, shivering, fever, stomach pain, or nausea. About 1 out of 6 people who got recombinant zoster vaccine experienced side effects that prevented them  from doing regular activities. Symptoms went away on their own in about 2 to 3 days. Side effects were more common in younger people. You should still get the second dose of recombinant zoster vaccine even if you had one of these reactions after the first dose. Other things that could happen after this vaccine:  People sometimes faint after medical procedures, including vaccination. Sitting or lying down for about 15 minutes can help prevent fainting and injuries caused by a fall. Tell your provider if you feel dizzy or have vision changes or ringing in the ears.  Some people get shoulder pain that can be more severe and longer-lasting than routine soreness that can follow injections. This happens very rarely.  Any medication can cause a severe allergic reaction. Such reactions to a vaccine are estimated at about 1 in a million doses, and would happen within a few minutes to a few hours after the vaccination. As with any medicine, there is a very remote chance of a vaccine causing a serious injury or death. The safety of vaccines is always being monitored. For more information, visit: http://www.aguilar.org/ 5. What if there is a serious problem? What should I look for?  Look for anything that concerns you, such as signs of a severe allergic reaction, very high fever, or unusual behavior. Signs of a severe allergic reaction can include hives, swelling of the face and throat, difficulty breathing, a fast heartbeat, dizziness, and weakness. These would usually start a few minutes to a few hours after the vaccination. What should I do?  If you think it is a severe allergic reaction or other emergency that can't wait, call 9-1-1 and get to the nearest hospital. Otherwise, call your health care provider. Afterward, the reaction should be reported to the Vaccine Adverse Event Reporting System (VAERS). Your doctor should file this report, or you can do it yourself through the VAERS web site  atwww.vaers.https://www.bray.com/ by calling (916)226-5254. VAERS does not give medical advice. 6. How can I learn more?  Ask your healthcare provider. He or she can give you the vaccine package insert or suggest other sources of information.  Call your local or state health department.  Contact the Centers for Disease Control and Prevention (CDC): ? Call 312-040-9743 (1-800-CDC-INFO) or ? Visit the CDC's website at http://hunter.com/ CDC Vaccine Information Statement (VIS) Recombinant Zoster Vaccine (09/19/2016) This information is not intended to replace advice given to you by your health care provider. Make sure you discuss any questions you have with your health care provider. Document Released: 10/04/2016 Document Revised: 10/04/2016 Document Reviewed: 10/04/2016 Elsevier Interactive Patient Education  [employer].

## 2017-06-01 NOTE — Assessment & Plan Note (Signed)
Managed by Dr. Tamala Julian

## 2017-06-01 NOTE — Progress Notes (Signed)
BP (!) 114/58   Pulse 80   Temp (!) 97.4 F (36.3 C) (Oral)   Resp 14   Wt 189 lb 3.2 oz (85.8 kg)   LMP  (LMP Unknown)   SpO2 91%   BMI 32.48 kg/m    Subjective:    Patient ID: Angel [name], female    DOB: Nov 11, 1930, 83 y.o.   MRN: [id number]  HPI: Angel [name] is a 83 y.o. female  Chief Complaint  Patient presents with  . Follow-up    discuss shingrix vaccine    HPI Patient is here to discuss the Shingrix vaccine; she is interested in that  She has been doing well; doing "appropriate end of life issues just because" she says; making provisions for her family; working with insurance people and not worry about that later  High cholesterol; last lipids were Dec 2017; big breakfast this morning  She is on 3 medicines for her blood pressure; monitored by Dr. Tamala Julian; her heart condition moved from being mild to moderate; she has the aortic aneurysm; she does get a little DOE walking up steps, "the hardest part of the aging process for me" she says; she denies chest pain or tearing  She has tapered down on her HRT; she is not having hot flashes; hysterectomy at 43; I recommended decreasing it further, but she does not want to reduce it; she loathes to cut it down any more  Depression screen [facility] 2/9 06/01/2017 12/02/2016 08/04/2016 01/05/2016 11/02/2015  Decreased Interest 0 0 0 0 0  Down, Depressed, Hopeless 0 0 0 0 0  PHQ - 2 Score 0 0 0 0 0    Relevant past medical, surgical, family and social history reviewed Past Medical History:  Diagnosis Date  . Anxiety   . Aortic aneurysm without rupture ([place])   . CAD (coronary artery disease)   . Chronic hip pain   . Endometriosis   . Hearing loss   . HTN, goal below 150/90   . Hyperlipidemia LDL goal <70 11/18/2014  . Osteopenia of the elderly   . Urge and stress incontinence    Past Surgical History:  Procedure Laterality Date  . ABDOMINAL HYSTERECTOMY  1976   total  . APPENDECTOMY    . BREAST EXCISIONAL BIOPSY  Left 1987   benign   Family History  Problem Relation Age of Onset  . CVA Mother   . Stroke Mother   . Breast cancer Maternal Grandmother 60  . Heart disease Maternal Grandmother   . Cancer Brother        skin   Social History   Social History  . Marital status: Widowed    Spouse name: N/A  . Number of children: N/A  . Years of education: N/A   Occupational History  . Not on file.   Social History Main Topics  . Smoking status: Never Smoker  . Smokeless tobacco: Never Used  . Alcohol use 0.0 oz/week     Comment: occasional  . Drug use: No  . Sexual activity: No   Other Topics Concern  . Not on file   Social History Narrative  . No narrative on file    Interim medical history since last visit reviewed. Allergies and medications reviewed  Review of Systems Per HPI unless specifically indicated above     Objective:    BP (!) 114/58   Pulse 80   Temp (!) 97.4 F (36.3 C) (Oral)   Resp 14   Wt  189 lb 3.2 oz (85.8 kg)   LMP  (LMP Unknown)   SpO2 91%   BMI 32.48 kg/m   Wt Readings from Last 3 Encounters:  06/01/17 189 lb 3.2 oz (85.8 kg)  01/19/17 187 lb 6.4 oz (85 kg)  12/26/16 186 lb 4.8 oz (84.5 kg)    Physical Exam  Constitutional: She appears well-developed and well-nourished. No distress.  Obese, no distress, very pleasant  Eyes: No scleral icterus.  Neck: No JVD present.  Cardiovascular: Normal rate and regular rhythm.   Pulmonary/Chest: Effort normal and breath sounds normal.  Abdominal: Normal appearance and bowel sounds are normal. She exhibits no distension.  Musculoskeletal: She exhibits no edema.  Neurological: She is alert.  Skin: No pallor.  Psychiatric: She has a normal mood and affect. Her mood appears not anxious. She does not exhibit a depressed mood.    Results for orders placed or performed in visit on 08/04/16  COMPLETE METABOLIC PANEL WITH GFR  Result Value Ref Range   Sodium 140 135 - 146 mmol/L   Potassium 4.6 3.5 - 5.3  mmol/L   Chloride 104 98 - 110 mmol/L   CO2 27 20 - 31 mmol/L   Glucose, Bld 90 65 - 99 mg/dL   BUN 27 (H) 7 - 25 mg/dL   Creat 0.80 0.60 - 0.88 mg/dL   Total Bilirubin 0.3 0.2 - 1.2 mg/dL   Alkaline Phosphatase 44 33 - 130 U/L   AST 25 10 - 35 U/L   ALT 28 6 - 29 U/L   Total Protein 6.7 6.1 - 8.1 g/dL   Albumin 3.9 3.6 - 5.1 g/dL   Calcium 9.1 8.6 - 10.4 mg/dL   GFR, Est African American 79 >=60 mL/min   GFR, Est Non African American 68 >=60 mL/min  Lipid panel  Result Value Ref Range   Cholesterol 181 <200 mg/dL   Triglycerides 351 (H) <150 mg/dL   HDL 41 (L) >50 mg/dL   Total CHOL/HDL Ratio 4.4 <5.0 Ratio   VLDL 70 (H) <30 mg/dL   LDL Cholesterol 70 <100 mg/dL      Assessment & Plan:   Problem List Items Addressed This Visit      Cardiovascular and Mediastinum   Hypertension goal BP (blood pressure) < 140/90 (Chronic)    Patient to call Dr. Tamala Julian, cardiologist, about pressure to make sure he's fine with this; I expect he is given the aneurysm, but just want to be sure      CAD (coronary artery disease) (Chronic)    No chest pain, seeing heart doctor      Aortic aneurysm without rupture ([place])    Managed by Dr. Tamala Julian        Other   Mixed stress and urge urinary incontinence    Seeing urologist Dr. Jacqlyn Larsen      Hyperlipidemia LDL goal <70 - Primary    Check lipids fasting in the next week      Hormone replacement therapy (postmenopausal)    Patient declined reducing / tapering her HRT any further       Other Visit Diagnoses    Need for shingles vaccine       counseling given; information sheets provided; she may get this at her local pharmacy   Needs flu shot       Relevant Orders   Flu vaccine HIGH DOSE PF (Fluzone High dose) (Completed)       Follow up plan: No Follow-up on file.  An after-visit  summary was printed and given to the patient at [facility].  Please see the patient instructions which may contain other information and recommendations  beyond what is mentioned above in the assessment and plan.  Meds ordered this encounter  Medications  . VITAMIN D, ERGOCALCIFEROL, PO    Sig: Take 1,000 Units by mouth daily.  . vitamin B-12 (CYANOCOBALAMIN) 100 MCG tablet    Sig: Take 100 mcg by mouth daily.  . Ascorbic Acid (VITAMIN C) 100 MG tablet    Sig: Take 100 mg by mouth daily.  [name] glucosamine-chondroitin 500-400 MG tablet    Sig: Take 1 tablet by mouth daily.  . Multiple Vitamin (MULTIVITAMIN) capsule    Sig: Take 1 capsule by mouth daily.    Orders Placed This Encounter  Procedures  . Flu vaccine HIGH DOSE PF (Fluzone High dose)

## 2017-06-01 NOTE — Assessment & Plan Note (Signed)
No chest pain, seeing heart doctor

## 2017-06-01 NOTE — Assessment & Plan Note (Signed)
Check lipids fasting in the next week

## 2017-06-01 NOTE — Assessment & Plan Note (Signed)
Patient to call Dr. Tamala Julian, cardiologist, about pressure to make sure he's fine with this; I expect he is given the aneurysm, but just want to be sure

## 2017-06-01 NOTE — Assessment & Plan Note (Signed)
Seeing urologist Dr. Jacqlyn Larsen

## 2017-06-01 NOTE — Assessment & Plan Note (Signed)
Patient declined reducing / tapering her HRT any further

## 2017-06-13 DIAGNOSIS — M9901 Segmental and somatic dysfunction of cervical region: Secondary | ICD-10-CM | POA: Diagnosis not present

## 2017-06-13 DIAGNOSIS — M5033 Other cervical disc degeneration, cervicothoracic region: Secondary | ICD-10-CM | POA: Diagnosis not present

## 2017-06-13 DIAGNOSIS — M62830 Muscle spasm of back: Secondary | ICD-10-CM | POA: Diagnosis not present

## 2017-07-05 DIAGNOSIS — L821 Other seborrheic keratosis: Secondary | ICD-10-CM | POA: Diagnosis not present

## 2017-07-05 DIAGNOSIS — Z85828 Personal history of other malignant neoplasm of skin: Secondary | ICD-10-CM | POA: Diagnosis not present

## 2017-07-05 DIAGNOSIS — Z08 Encounter for follow-up examination after completed treatment for malignant neoplasm: Secondary | ICD-10-CM | POA: Diagnosis not present

## 2017-07-19 DIAGNOSIS — H2513 Age-related nuclear cataract, bilateral: Secondary | ICD-10-CM | POA: Diagnosis not present

## 2017-08-13 ENCOUNTER — Other Ambulatory Visit: Payer: Self-pay | Admitting: Family Medicine

## 2017-08-13 DIAGNOSIS — Z79890 Hormone replacement therapy (postmenopausal): Secondary | ICD-10-CM

## 2017-08-14 NOTE — Telephone Encounter (Signed)
Called pt informed her of the information below. Pt gave verbal understanding.  

## 2017-08-14 NOTE — Telephone Encounter (Signed)
Please let the patient know that I'm going to wean her HRT a little bit to one pill every 3 days; I sent a 3 month supply and we'll taper a little more with the next Rx

## 2017-08-15 DIAGNOSIS — M5033 Other cervical disc degeneration, cervicothoracic region: Secondary | ICD-10-CM | POA: Diagnosis not present

## 2017-08-15 DIAGNOSIS — M9901 Segmental and somatic dysfunction of cervical region: Secondary | ICD-10-CM | POA: Diagnosis not present

## 2017-08-15 DIAGNOSIS — M62830 Muscle spasm of back: Secondary | ICD-10-CM | POA: Diagnosis not present

## 2017-08-30 ENCOUNTER — Other Ambulatory Visit: Payer: Self-pay | Admitting: Family Medicine

## 2017-08-30 DIAGNOSIS — Z1231 Encounter for screening mammogram for malignant neoplasm of breast: Secondary | ICD-10-CM

## 2017-09-04 DIAGNOSIS — M5033 Other cervical disc degeneration, cervicothoracic region: Secondary | ICD-10-CM | POA: Diagnosis not present

## 2017-09-04 DIAGNOSIS — M9901 Segmental and somatic dysfunction of cervical region: Secondary | ICD-10-CM | POA: Diagnosis not present

## 2017-09-04 DIAGNOSIS — M62830 Muscle spasm of back: Secondary | ICD-10-CM | POA: Diagnosis not present

## 2017-09-19 DIAGNOSIS — M5033 Other cervical disc degeneration, cervicothoracic region: Secondary | ICD-10-CM | POA: Diagnosis not present

## 2017-09-19 DIAGNOSIS — M62830 Muscle spasm of back: Secondary | ICD-10-CM | POA: Diagnosis not present

## 2017-09-19 DIAGNOSIS — M9901 Segmental and somatic dysfunction of cervical region: Secondary | ICD-10-CM | POA: Diagnosis not present

## 2017-10-05 ENCOUNTER — Ambulatory Visit
Admission: RE | Admit: 2017-10-05 | Discharge: 2017-10-05 | Disposition: A | Discharge status: Home / Self Care | Payer: Medicare Other | Source: Ambulatory Visit | Attending: Family Medicine | Admitting: Family Medicine

## 2017-10-05 DIAGNOSIS — Z1231 Encounter for screening mammogram for malignant neoplasm of breast: Secondary | ICD-10-CM | POA: Diagnosis present

## 2017-10-18 ENCOUNTER — Other Ambulatory Visit: Payer: Self-pay | Admitting: Family Medicine

## 2017-10-18 DIAGNOSIS — Z79890 Hormone replacement therapy (postmenopausal): Secondary | ICD-10-CM

## 2017-10-20 ENCOUNTER — Other Ambulatory Visit: Payer: Self-pay

## 2017-10-20 DIAGNOSIS — Z79890 Hormone replacement therapy (postmenopausal): Secondary | ICD-10-CM

## 2017-10-20 MED ORDER — ESTROGENS CONJUGATED 0.625 MG PO TABS: ORAL_TABLET | ORAL | 0 refills | Status: DC

## 2017-10-20 NOTE — Telephone Encounter (Signed)
She's not supposed to take it 3 days a week She is supposed to take it every 3 days (10 pills every month, not 12 to 13 per month) She says that she five pills left Her other pharmacy closed down She says her bottle reads: Take one tablet by mouth every three days Verified instructions; new Rx sent; consider another taper maybe another taper in 3-6 months, perhaps from 10 pills a month to 8 or 9 pills a month (twice a week) I reminded her of the dangers of this medicine, including but not limited to breast cancer, stroke, heart attack She understands and agrees, saying "I'm almost 87"

## 2017-10-20 NOTE — Telephone Encounter (Signed)
Copied from [place]. Topic: Quick Communication - Rx Refill/Question >> Oct 20, 2017  2:31 PM Oneta Rack wrote: Caller name: Relation to pt: Call back number: Pharmacy:  Reason for call:  Patient requesting estrogens, conjugated, (PREMARIN) 0.625 MG tablet, patient stated she contacted pharmacy, patient in need of clarity regarding frequenc                                                                                                                                                                                                                                                                                                                                                                                                                                                                                      Relation to pt: self Call back number: [phone] Pharmacy: [id number] [place], [place] [phone] (Phone) [phone] (Fax)  Reason for call:  Patient requesting estrogens, conjugated, (PREMARIN) 0.625 MG tablet, patient contacted pharmacy but would like to discuss the frequency, please advise       >> Oct 20, 2017  2:45 PM Oneta Rack wrote:   Relation to pt: self Call back number: [phone] Pharmacy[facility]  [place], [place] [address] [phone] (Phone) [phone] (Fax)  Reason for call:   Patient requesting estrogens, conjugated, (PREMARIN) 0.625 MG  tablet, patient contacted pharmacy but would like to discuss the frequency, please advise

## 2017-10-20 NOTE — Telephone Encounter (Signed)
Pt states only has 2 pills left.  She states is very careful and is taking as directed every Monday Wednesday and Friday per bottle instructions. She would like for you to please call her to discuss.

## 2017-10-20 NOTE — Telephone Encounter (Signed)
Please clarify frequency if that's the question It's too soon to refill the medicine, so no new Rx approved Thank you

## 2017-11-14 DIAGNOSIS — M5033 Other cervical disc degeneration, cervicothoracic region: Secondary | ICD-10-CM | POA: Diagnosis not present

## 2017-11-14 DIAGNOSIS — M62830 Muscle spasm of back: Secondary | ICD-10-CM | POA: Diagnosis not present

## 2017-11-14 DIAGNOSIS — M9903 Segmental and somatic dysfunction of lumbar region: Secondary | ICD-10-CM | POA: Diagnosis not present

## 2017-11-14 DIAGNOSIS — M9901 Segmental and somatic dysfunction of cervical region: Secondary | ICD-10-CM | POA: Diagnosis not present

## 2017-12-19 DIAGNOSIS — M25561 Pain in right knee: Secondary | ICD-10-CM | POA: Diagnosis not present

## 2017-12-19 DIAGNOSIS — M170 Bilateral primary osteoarthritis of knee: Secondary | ICD-10-CM | POA: Diagnosis not present

## 2017-12-19 DIAGNOSIS — G8929 Other chronic pain: Secondary | ICD-10-CM | POA: Diagnosis not present

## 2017-12-19 DIAGNOSIS — M25562 Pain in left knee: Secondary | ICD-10-CM | POA: Diagnosis not present

## 2018-01-18 ENCOUNTER — Encounter: Payer: Self-pay | Admitting: Family Medicine

## 2018-01-18 ENCOUNTER — Ambulatory Visit (INDEPENDENT_AMBULATORY_CARE_PROVIDER_SITE_OTHER): Payer: Medicare Other | Admitting: Family Medicine

## 2018-01-18 VITALS — BP 130/76 | HR 100 | Temp 98.2°F | Resp 12 | Ht 64.0 in | Wt 192.0 lb

## 2018-01-18 DIAGNOSIS — L03119 Cellulitis of unspecified part of limb: Secondary | ICD-10-CM

## 2018-01-18 DIAGNOSIS — Z23 Encounter for immunization: Secondary | ICD-10-CM | POA: Diagnosis not present

## 2018-01-18 DIAGNOSIS — I10 Essential (primary) hypertension: Secondary | ICD-10-CM | POA: Diagnosis not present

## 2018-01-18 DIAGNOSIS — R5383 Other fatigue: Secondary | ICD-10-CM | POA: Diagnosis not present

## 2018-01-18 DIAGNOSIS — Z5181 Encounter for therapeutic drug level monitoring: Secondary | ICD-10-CM

## 2018-01-18 DIAGNOSIS — S81812A Laceration without foreign body, left lower leg, initial encounter: Secondary | ICD-10-CM | POA: Diagnosis not present

## 2018-01-18 DIAGNOSIS — E669 Obesity, unspecified: Secondary | ICD-10-CM | POA: Diagnosis not present

## 2018-01-18 DIAGNOSIS — E785 Hyperlipidemia, unspecified: Secondary | ICD-10-CM

## 2018-01-18 DIAGNOSIS — R339 Retention of urine, unspecified: Secondary | ICD-10-CM

## 2018-01-18 MED ORDER — TETANUS-DIPHTH-ACELL PERTUSSIS 5-2.5-18.5 LF-MCG/0.5 IM SUSP
0.5000 mL | Freq: Once | INTRAMUSCULAR | Status: AC
  Administered 2018-01-18: 0.5 mL via INTRAMUSCULAR

## 2018-01-18 MED ORDER — CEPHALEXIN 500 MG PO CAPS: 500.0000 mg | ORAL_CAPSULE | Freq: Two times a day (BID) | ORAL | 0 refills | Status: DC

## 2018-01-18 NOTE — Assessment & Plan Note (Signed)
Check lipids 

## 2018-01-18 NOTE — Progress Notes (Signed)
BP 130/76   Pulse 100   Temp 98.2 F (36.8 C) (Oral)   Resp 12   Ht 5\' 4"  (1.626 m)   Wt 192 lb (87.1 kg)   LMP  (LMP Unknown)   SpO2 93%   BMI 32.96 kg/m    Subjective:    Patient ID: Angel [name], female    DOB: July 14, 1931, 84 y.o.   MRN: [id number]  HPI: Angel [name] is a 84 y.o. female  Chief Complaint  Patient presents with  . Laceration    Fall cut left leg, 10 day ago  . Referral    urology  . Tick Removal    wants rx for doxy     HPI Patient is here for an acute visit About ten days ago, was stepping up onto gazebo She scraped up the left shin She did fall and there was a large group of people going on; fell on her bottom, required some help getting up; bled quit a bit; has used neosporin; no fevers; no drainage now; no red streaking upwards  She asked about the shingles shot; she has never had shingles; sister-in-law in [place] has shingles right now, worst pain she's ever had; never had the old shingles vaccine  She said that her urologist, Dr. Edrick Oh, has moved out of [place]; she has another name for a new urologist; she would like to see new urologist; incontinence issues; no cath use; uses imipramine; [facility] folks gave her a temporary supply of Rx until she gets in with new provider  Obesity; she is very careful about her calories; 1100 kcal a day; metabolism slows down; she has arthritis and it's difficult to be as active; [facility] and works with a Physiological scientist; it's helping somewhat with arthritis; she has seen Dr. Marry Guan (ortho); she is doing some of his exercises at home; she is going on a big trip in August ([place], [place]); veggies and fruit; scant dairy  High cholesterol; last TG 351; did just eat today  Depression screen [facility] 2/9 01/18/2018 06/01/2017 12/02/2016 08/04/2016 01/05/2016  Decreased Interest 0 0 0 0 0  Down, Depressed, Hopeless 0 0 0 0 0  PHQ - 2 Score 0 0 0 0 0   Fall Risk  01/18/2018 06/01/2017 12/02/2016 08/04/2016  01/05/2016  Falls in the past year? Yes No No No No  Number falls in past yr: 1 - - - -  Injury with Fall? Yes - - - -   Functional Status Survey: Is the patient deaf or have difficulty hearing?: No Does the patient have difficulty seeing, even when wearing glasses/contacts?: No Does the patient have difficulty concentrating, remembering, or making decisions?: No Does the patient have difficulty walking or climbing stairs?: No Does the patient have difficulty dressing or bathing?: No Does the patient have difficulty doing errands alone such as visiting a doctor's office or shopping?: No   Office Visit from 01/18/2018 in [facility]  AUDIT-C Score  3       Relevant past medical, surgical, family and social history reviewed Past Medical History:  Diagnosis Date  . Anxiety   . Aortic aneurysm without rupture ([place])   . CAD (coronary artery disease)   . Chronic hip pain   . Endometriosis   . Hearing loss   . HTN, goal below 150/90   . Hyperlipidemia LDL goal <70 11/18/2014  . Osteopenia of the elderly   . Urge and stress  incontinence    Past Surgical History:  Procedure Laterality Date  . ABDOMINAL HYSTERECTOMY  1976   total  . APPENDECTOMY    . BREAST EXCISIONAL BIOPSY Left 1987   benign   Family History  Problem Relation Age of Onset  . CVA Mother   . Stroke Mother   . Breast cancer Maternal Grandmother 60  . Heart disease Maternal Grandmother   . Cancer Brother        skin   Social History   Tobacco Use  . Smoking status: Never Smoker  . Smokeless tobacco: Never Used  Substance Use Topics  . Alcohol use: Yes    Alcohol/week: 0.6 oz    Types: 1 Cans of beer per week    Comment: occasional  . Drug use: No    Interim medical history since last visit reviewed. Allergies and medications reviewed  Review of Systems Per HPI unless specifically indicated above     Objective:    BP 130/76   Pulse 100   Temp 98.2 F (36.8 C) (Oral)    Resp 12   Ht 5\' 4"  (1.626 m)   Wt 192 lb (87.1 kg)   LMP  (LMP Unknown)   SpO2 93%   BMI 32.96 kg/m   Wt Readings from Last 3 Encounters:  01/18/18 192 lb (87.1 kg)  06/01/17 189 lb 3.2 oz (85.8 kg)  01/19/17 187 lb 6.4 oz (85 kg)    Physical Exam  Constitutional: She appears well-developed and well-nourished. No distress.  HENT:  Head: Normocephalic and atraumatic.  Eyes: EOM are normal. No scleral icterus.  Neck: No thyromegaly present.  Cardiovascular: Normal rate, regular rhythm and normal heart sounds.  No murmur heard. Pulmonary/Chest: Effort normal and breath sounds normal. No respiratory distress. She has no wheezes.  Abdominal: Soft. Bowel sounds are normal. She exhibits no distension.  Musculoskeletal: Normal range of motion. She exhibits no edema.  Neurological: She is alert. She exhibits normal muscle tone.  Skin: Skin is warm and dry. Lesion (LEFT shin, firm, erythematous area with abrasion; no proximal erythematous streaking) noted. She is not diaphoretic. No pallor.     Psychiatric: She has a normal mood and affect. Her behavior is normal. Judgment and thought content normal.       Assessment & Plan:   Problem List Items Addressed This Visit      Cardiovascular and Mediastinum   Hypertension goal BP (blood pressure) < 140/90 (Chronic)    controlled        Other   Incomplete bladder emptying   Relevant Orders   Ambulatory referral to Urology   Obesity (BMI 30.0-34.9)    Discussed; encouraged her to decrease total calories just a little, try to get adequate hydration; stay as active as possible      Medication monitoring encounter    Check liver and kidneys      Relevant Orders   COMPLETE METABOLIC PANEL WITH GFR (Completed)   Hyperlipidemia LDL goal <70    Check lipids      Relevant Orders   Lipid panel (Completed)    Other Visit Diagnoses    Laceration of left lower extremity, initial encounter    -  Primary   Relevant Medications   Tdap  (BOOSTRIX) injection 0.5 mL (Completed)   Cellulitis of lower leg       start the antibiotics; cautioned about C diff; see AVS; go to the ER if worsening; topical care discussed   Relevant Orders  CBC with Differential/Platelet (Completed)   Need for Tdap vaccination       Relevant Medications   Tdap (BOOSTRIX) injection 0.5 mL (Completed)   Other fatigue       Relevant Orders   TSH (Completed)       Follow up plan: No follow-ups on file.  An after-visit summary was printed and given to the patient at [place].  Please see the patient instructions which may contain other information and recommendations beyond what is mentioned above in the assessment and plan.  Meds ordered this encounter  Medications  . Tdap (BOOSTRIX) injection 0.5 mL  . cephALEXin (KEFLEX) 500 MG capsule    Sig: Take 1 capsule (500 mg total) by mouth 2 (two) times daily.    Dispense:  14 capsule    Refill:  0    Orders Placed This Encounter  Procedures  . CBC with Differential/Platelet  . COMPLETE METABOLIC PANEL WITH GFR  . Lipid panel  . TSH  . Ambulatory referral to Urology

## 2018-01-18 NOTE — Patient Instructions (Addendum)
Start the antibiotics Please do eat yogurt or kimchi or take a probiotic daily for the next month We want to replace the healthy germs in the gut If you notice foul, watery diarrhea in the next two months, schedule an appointment RIGHT AWAY or go to an urgent care or the emergency room if a holiday or over a weekend  You received the vaccine to protect against tetanus and diphtheria and pertussis today; the tetanus and diphtheria portions will provide protection up to ten years, and the pertussis component will give you protection against whooping cough for life  Consider getting the new shingles vaccine called Shingrix; that is available for individuals 82 years of age and older, and is recommended even if you have had shingles in the past and/or already received the old shingles vaccine (Zostavax); it is a two-part series, and is available at many local pharmacies  Please do schedule an appointment or go to urgent care if you have an embedded tick or a concerning bite or symptoms of tick-borne disease  Let's get labs today We'll get you to the new urologist If you have not heard anything from my staff in a week about any orders/referrals/studies from today, please contact us here to follow-up [phone]   Tick Bite Information, Adult Ticks are insects that draw blood for food. Most ticks live in shrubs and grassy areas. They climb onto people and animals that brush against the leaves and grasses that they rest on. Then they bite, attaching themselves to the skin. Most ticks are harmless, but some ticks carry germs that can spread to a person through a bite and cause a disease. To reduce your risk of getting a disease from a tick bite, it is important to take steps to prevent tick bites. It is also important to check for ticks after being outdoors. If you find that a tick has attached to you, watch for symptoms of disease. How can I prevent tick bites? Take these steps to help prevent tick  bites when you are outdoors in an area where ticks are found:  Use insect repellent that has DEET (20% or higher), picaridin, or IR3535 in it. Use it on: ? Skin that is showing. ? The top of your boots. ? Your pant legs. ? Your sleeve cuffs.  For repellent products that contain permethrin, follow product instructions. Use these products on: ? Clothing. ? Gear. ? Boots. ? Tents.  Wear protective clothing. Long sleeves and long pants offer the best protection from ticks.  Wear light-colored clothing so you can see ticks more easily.  Tuck your pant legs into your socks.  If you go walking on a trail, stay in the middle of the trail so your skin, hair, and clothing do not touch the bushes.  Avoid walking through areas with long grass.  Check for ticks on your clothing, hair, and skin often while you are outside, and check again before you go inside. Make sure to check the places that ticks attach themselves most often. These places include the scalp, neck, armpits, waist, groin, and joint areas. Ticks that carry a disease called Lyme disease have to be attached to the skin for 24-48 hours. Checking for ticks every day will lessen your risk of this and other diseases.  When you come indoors, wash your clothes and take a shower or a bath right away. Dry your clothes in a dryer on high heat for at least 60 minutes. This will kill any ticks in  your clothes.  What is the proper way to remove a tick? If you find a tick on your body, remove it as soon as possible. Removing a tick sooner rather than later can prevent germs from passing from the tick to your body. To remove a tick that is crawling on your skin but has not bitten:  Go outdoors and brush the tick off.  Remove the tick with tape or a lint roller.  To remove a tick that is attached to your skin:  Wash your hands.  If you have latex gloves, put them on.  Use tweezers, curved forceps, or a tick-removal tool to gently grasp the  tick as close to your skin and the tick's head as possible.  Gently pull with steady, upward pressure until the tick lets go. When removing the tick: ? Take care to keep the tick's head attached to its body. ? Do not twist or jerk the tick. This can make the tick's head or mouth break off. ? Do not squeeze or crush the tick's body. This could force disease-carrying fluids from the tick into your body.  Do not try to remove a tick with heat, alcohol, petroleum jelly, or fingernail polish. Using these methods can cause the tick to salivate and regurgitate into your bloodstream, increasing your risk of getting a disease. What should I do after removing a tick?  Clean the bite area with soap and water, rubbing alcohol, or an iodine scrub.  If an antiseptic cream or ointment is available, apply a small amount to the bite site.  Wash and disinfect any instruments that you used to remove the tick. How should I dispose of a tick? To dispose of a live tick, use one of these methods:  Place it in rubbing alcohol.  Place it in a sealed bag or container.  Wrap it tightly in tape.  Flush it down the toilet.  Contact a health care provider if:  You have symptoms of a disease after a tick bite. Symptoms of a tick-borne disease can occur from moments after the tick bites to up to 30 days after a tick is removed. Symptoms include: ? Muscle, joint, or bone pain. ? Difficulty walking or moving your legs. ? Numbness in the legs. ? Paralysis. ? Red rash around the tick bite area that is shaped like a target or a "bull's-eye." ? Redness and swelling in the area of the tick bite. ? Fever. ? Repeated vomiting. ? Diarrhea. ? Weight loss. ? Tender, swollen lymph glands. ? Shortness of breath. ? Cough. ? Pain in the abdomen. ? Headache. ? Abnormal tiredness. ? A change in your level of consciousness. ? Confusion. Get help right away if:  You are not able to remove a tick.  A part of a tick  breaks off and gets stuck in your skin.  Your symptoms get worse. Summary  Ticks may carry germs that can spread to a person through a bite and cause disease.  Wear protective clothing and use insect repellent to prevent tick bites. Follow product instructions.  If you find a tick on your body, remove it as soon as possible. If the tick is attached, do not try to remove with heat, alcohol, petroleum jelly, or fingernail polish.  Remove the attached tick using tweezers, curved forceps, or a tick-removal tool. Gently pull with steady, upward pressure until the tick lets go. Do not twist or jerk the tick. Do not squeeze or crush the tick's body.  If  you have symptoms after being bitten by a tick, contact a health care provider. This information is not intended to replace advice given to you by your health care provider. Make sure you discuss any questions you have with your health care provider. Document Released: 07/22/2000 Document Revised: 05/06/2016 Document Reviewed: 05/06/2016 Elsevier Interactive Patient Education  [employer].

## 2018-01-18 NOTE — Assessment & Plan Note (Signed)
controlled 

## 2018-01-18 NOTE — Assessment & Plan Note (Signed)
Check liver and kidneys 

## 2018-01-18 NOTE — Assessment & Plan Note (Signed)
Discussed; encouraged her to decrease total calories just a little, try to get adequate hydration; stay as active as possible

## 2018-01-19 LAB — COMPLETE METABOLIC PANEL WITH GFR
AG Ratio: 1.4 (calc) (ref 1.0–2.5)
ALT: 25 U/L (ref 6–29)
AST: 23 U/L (ref 10–35)
Albumin: 3.8 g/dL (ref 3.6–5.1)
Alkaline phosphatase (APISO): 60 U/L (ref 33–130)
BUN/Creatinine Ratio: 29 (calc) — ABNORMAL HIGH (ref 6–22)
BUN: 30 mg/dL — ABNORMAL HIGH (ref 7–25)
CO2: 27 mmol/L (ref 20–32)
Calcium: 9.2 mg/dL (ref 8.6–10.4)
Chloride: 105 mmol/L (ref 98–110)
Creat: 1.05 mg/dL — ABNORMAL HIGH (ref 0.60–0.88)
GFR, Est African American: 56 mL/min/{1.73_m2} — ABNORMAL LOW (ref >=60)
GFR, Est Non African American: 48 mL/min/{1.73_m2} — ABNORMAL LOW (ref >=60)
Globulin: 2.7 g/dL (calc) (ref 1.9–3.7)
Glucose, Bld: 167 mg/dL — ABNORMAL HIGH (ref 65–139)
Potassium: 3.9 mmol/L (ref 3.5–5.3)
Sodium: 141 mmol/L (ref 135–146)
Total Bilirubin: 0.3 mg/dL (ref 0.2–1.2)
Total Protein: 6.5 g/dL (ref 6.1–8.1)

## 2018-01-19 LAB — CBC WITH DIFFERENTIAL/PLATELET
Basophils Absolute: 51 cells/uL (ref 0–200)
Basophils Relative: 0.8 %
Eosinophils Absolute: 288 cells/uL (ref 15–500)
Eosinophils Relative: 4.5 %
HCT: 38 % (ref 35.0–45.0)
Hemoglobin: 13 g/dL (ref 11.7–15.5)
Lymphs Abs: 1606 cells/uL (ref 850–3900)
MCH: 31.7 pg (ref 27.0–33.0)
MCHC: 34.2 g/dL (ref 32.0–36.0)
MCV: 92.7 fL (ref 80.0–100.0)
MPV: 10.4 fL (ref 7.5–12.5)
Monocytes Relative: 7.6 %
Neutro Abs: 3968 cells/uL (ref 1500–7800)
Neutrophils Relative %: 62 %
Platelets: 286 10*3/uL (ref 140–400)
RBC: 4.1 10*6/uL (ref 3.80–5.10)
RDW: 11.8 % (ref 11.0–15.0)
Total Lymphocyte: 25.1 %
WBC mixed population: 486 cells/uL (ref 200–950)
WBC: 6.4 10*3/uL (ref 3.8–10.8)

## 2018-01-19 LAB — LIPID PANEL
Cholesterol: 169 mg/dL (ref <200)
HDL: 52 mg/dL (ref >50)
LDL Cholesterol (Calc): 82 mg/dL (calc)
Non-HDL Cholesterol (Calc): 117 mg/dL (calc) (ref <130)
Total CHOL/HDL Ratio: 3.3 (calc) (ref <5.0)
Triglycerides: 266 mg/dL — ABNORMAL HIGH (ref <150)

## 2018-01-19 LAB — TSH: TSH: 1.44 mIU/L (ref 0.40–4.50)

## 2018-01-23 ENCOUNTER — Other Ambulatory Visit: Payer: Self-pay | Admitting: Family Medicine

## 2018-01-23 DIAGNOSIS — Z79890 Hormone replacement therapy (postmenopausal): Secondary | ICD-10-CM

## 2018-01-23 MED ORDER — ESTROGENS CONJUGATED 0.3 MG PO TABS: ORAL_TABLET | ORAL | 0 refills | Status: DC

## 2018-01-23 NOTE — Telephone Encounter (Signed)
Pt.notified

## 2018-01-23 NOTE — Telephone Encounter (Signed)
Please let the patient know that we're going to wean her estrogen a little more We'll go from a total of 12.5 mg per week down to 9 mg per week (new strength and instructions) Thank you

## 2018-01-25 ENCOUNTER — Telehealth: Payer: Self-pay | Admitting: Family Medicine

## 2018-01-25 ENCOUNTER — Other Ambulatory Visit: Payer: Self-pay | Admitting: Interventional Cardiology

## 2018-01-25 DIAGNOSIS — N289 Disorder of kidney and ureter, unspecified: Secondary | ICD-10-CM

## 2018-01-25 NOTE — Telephone Encounter (Signed)
Copied from [id number]. Topic: Quick Communication - See Telephone Encounter >> Jan 25, 2018  3:34 PM Bea Graff, NT wrote: CRM for notification. See Telephone encounter for: 01/25/18. Pt would like a call to go over her lab results.

## 2018-01-25 NOTE — Telephone Encounter (Signed)
I reviewed labs I called patient; number was busy x 2 I'll try again tomorrow

## 2018-01-25 NOTE — Telephone Encounter (Signed)
Please review labs. 

## 2018-01-26 NOTE — Telephone Encounter (Signed)
Her glucose was high because she was non-fasting Her kidney function declined She has been drinking lots of water since appt She has been taking ibuprofen; stop all NSAIDs Recheck BMP in one month Only safe thing for pain is tylenol Total cholesterol is down and HDL is 52 TG were high but she was NON-fasting She is working on weight loss assiduously She is adjusting to her age and her new normal

## 2018-02-06 DIAGNOSIS — M5033 Other cervical disc degeneration, cervicothoracic region: Secondary | ICD-10-CM | POA: Diagnosis not present

## 2018-02-06 DIAGNOSIS — M9903 Segmental and somatic dysfunction of lumbar region: Secondary | ICD-10-CM | POA: Diagnosis not present

## 2018-02-06 DIAGNOSIS — M62830 Muscle spasm of back: Secondary | ICD-10-CM | POA: Diagnosis not present

## 2018-02-06 DIAGNOSIS — M9901 Segmental and somatic dysfunction of cervical region: Secondary | ICD-10-CM | POA: Diagnosis not present

## 2018-02-12 DIAGNOSIS — M62830 Muscle spasm of back: Secondary | ICD-10-CM | POA: Diagnosis not present

## 2018-02-12 DIAGNOSIS — M9901 Segmental and somatic dysfunction of cervical region: Secondary | ICD-10-CM | POA: Diagnosis not present

## 2018-02-12 DIAGNOSIS — M9903 Segmental and somatic dysfunction of lumbar region: Secondary | ICD-10-CM | POA: Diagnosis not present

## 2018-02-12 DIAGNOSIS — M5033 Other cervical disc degeneration, cervicothoracic region: Secondary | ICD-10-CM | POA: Diagnosis not present

## 2018-02-19 DIAGNOSIS — M9901 Segmental and somatic dysfunction of cervical region: Secondary | ICD-10-CM | POA: Diagnosis not present

## 2018-02-19 DIAGNOSIS — M9903 Segmental and somatic dysfunction of lumbar region: Secondary | ICD-10-CM | POA: Diagnosis not present

## 2018-02-19 DIAGNOSIS — M5033 Other cervical disc degeneration, cervicothoracic region: Secondary | ICD-10-CM | POA: Diagnosis not present

## 2018-02-19 DIAGNOSIS — M62830 Muscle spasm of back: Secondary | ICD-10-CM | POA: Diagnosis not present

## 2018-02-21 NOTE — Progress Notes (Signed)
Cardiology Office Note    Date:  02/22/2018   ID:  [name], Angel January 25, 1931, MRN [id number]  PCP:  Arnetha Courser, MD  Cardiologist: Sinclair Grooms, MD   Chief Complaint  Patient presents with  . Coronary Artery Disease    History of Present Illness:  Angel [name] is a 84 y.o. female poor R-wave progression on EKG, prior history of abnormal myocardial perfusion study, question of small apical infarct or diverticulum by cardiac MRI, hypertension, hyperlipidemia ands Aortic enlargement.  She is doing well.  She denies angina, syncope, and dyspnea.  She is [place] a trip to [place] so: For presentation by her significant other.  She has not had lower extremity swelling, palpitations, or other complaints.  Overall she feels well.   Past Medical History:  Diagnosis Date  . Anxiety   . Aortic aneurysm without rupture ([place])   . CAD (coronary artery disease)   . Chronic hip pain   . Endometriosis   . Hearing loss   . HTN, goal below 150/90   . Hyperlipidemia LDL goal <70 11/18/2014  . Osteopenia of the elderly   . Urge and stress incontinence     Past Surgical History:  Procedure Laterality Date  . ABDOMINAL HYSTERECTOMY  1976   total  . APPENDECTOMY    . BREAST EXCISIONAL BIOPSY Left 1987   benign    Current Medications: Outpatient Medications Prior to Visit  Medication Sig Dispense Refill  . amLODipine (NORVASC) 5 MG tablet Take 1 tablet (5 mg total) by mouth daily. 90 tablet 3  . Ascorbic Acid (VITAMIN C) 100 MG tablet Take 100 mg by mouth daily.    [name] atorvastatin (LIPITOR) 40 MG tablet Take 1 tablet (40 mg total) by mouth at bedtime. 90 tablet 3  . cephALEXin (KEFLEX) 500 MG capsule Take 1 capsule (500 mg total) by mouth 2 (two) times daily. 14 capsule 0  . clobetasol cream (TEMOVATE) 7.02 % Apply 1 application topically 2 (two) times daily as needed (rashes).    . estrogens, conjugated, (PREMARIN) 0.3 MG tablet One by mouth three days per week 39 tablet 0   . glucosamine-chondroitin 500-400 MG tablet Take 1 tablet by mouth daily.    [name] imipramine (TOFRANIL) 25 MG tablet Take 25 mg by mouth at bedtime.    [name] lisinopril (PRINIVIL,ZESTRIL) 40 MG tablet TAKE 1 TABLET BY MOUTH TWICE A DAY 180 tablet 3  . lisinopril (PRINIVIL,ZESTRIL) 40 MG tablet Take 40 mg by mouth daily.    . metoprolol succinate (TOPROL-XL) 50 MG 24 hr tablet Take 1 tablet (50 mg total) by mouth daily. Take with or immediately following a meal. 90 tablet 3  . Multiple Vitamin (MULTIVITAMIN) capsule Take 1 capsule by mouth daily.    . vitamin B-12 (CYANOCOBALAMIN) 100 MCG tablet Take 100 mcg by mouth daily.    [name] VITAMIN D, ERGOCALCIFEROL, PO Take 1,000 Units by mouth daily.    . clobetasol cream (TEMOVATE) 6.37 % Apply 1 application topically 2 (two) times daily. Too strong for face, underarms, groin (Patient not taking: Reported on 02/22/2018) 30 g 0   No facility-administered medications prior to visit.      Allergies:   Codeine; Levofloxacin; Prednisone; and Sulfa antibiotics   Social History   Socioeconomic History  . Marital status: Widowed    Spouse name: Not on file  . Number of children: Not on file  . Years of education: Not on file  . Highest education  level: Not on file  Occupational History  . Not on file  Social Needs  . Financial resource strain: Not on file  . Food insecurity:    Worry: Not on file    Inability: Not on file  . Transportation needs:    Medical: Not on file    Non-medical: Not on file  Tobacco Use  . Smoking status: Never Smoker  . Smokeless tobacco: Never Used  Substance and Sexual Activity  . Alcohol use: Yes    Alcohol/week: 0.6 oz    Types: 1 Cans of beer per week    Comment: occasional  . Drug use: No  . Sexual activity: Yes  Lifestyle  . Physical activity:    Days per week: Not on file    Minutes per session: Not on file  . Stress: Not on file  Relationships  . Social connections:    Talks on phone: Not on file    Gets  together: Not on file    Attends religious service: Not on file    Active member of club or organization: Not on file    Attends meetings of clubs or organizations: Not on file    Relationship status: Not on file  Other Topics Concern  . Not on file  Social History Narrative  . Not on file     Family History:  The patient's family history includes Breast cancer (age of onset: 10) in her maternal grandmother; CVA in her mother; Cancer in her brother; Heart disease in her maternal grandmother; Stroke in her mother.   ROS:   Please see the history of present illness.    Occasional knee discomfort, decreased hearing, difficulty with balance, back pain, and bruising. All other systems reviewed and are negative.   PHYSICAL EXAM:   VS:  BP 126/74   Pulse 69   Ht 5\' 4"  (1.626 m)   Wt 191 lb 3.2 oz (86.7 kg)   LMP  (LMP Unknown)   BMI 32.82 kg/m    GEN: Well nourished, well developed, in no acute distress  HEENT: normal  Neck: no JVD, carotid bruits, or masses Cardiac: RRR; 2/6 to 3/6 crescendo decrescendo systolic murmur of aortic stenosis.  No rubs, or gallops,no edema.  Respiratory:  clear to auscultation bilaterally, normal work of breathing GI: soft, nontender, nondistended, + BS MS: no deformity or atrophy  Skin: warm and dry, no rash Neuro:  Alert and Oriented x 3, Strength and sensation are intact Psych: euthymic mood, full affect  Wt Readings from Last 3 Encounters:  02/22/18 191 lb 3.2 oz (86.7 kg)  01/18/18 192 lb (87.1 kg)  06/01/17 189 lb 3.2 oz (85.8 kg)      Studies/Labs Reviewed:   EKG:  EKG normal sinus rhythm, poor R wave progression V1 through V4, inferior Q waves.  No change when compared to prior tracings.  Recent Labs: 01/18/2018: ALT 25; BUN 30; Creat 1.05; Hemoglobin 13.0; Platelets 286; Potassium 3.9; Sodium 141; TSH 1.44   Lipid Panel    Component Value Date/Time   CHOL 169 01/18/2018 1416   CHOL 170 01/06/2016 0839   TRIG 266 (H) 01/18/2018  1416   HDL 52 01/18/2018 1416   HDL 53 01/06/2016 0839   CHOLHDL 3.3 01/18/2018 1416   VLDL 70 (H) 08/04/2016 1601   LDLCALC 82 01/18/2018 1416    Additional studies/ records that were reviewed today include:  None 2D Doppler echocardiogram June 2018: Study Conclusions  - Left ventricle: The cavity size  was normal. There was moderate   concentric hypertrophy. Systolic function was normal. The   estimated ejection fraction was in the range of 60% to 65%. Wall   motion was normal; there were no regional wall motion   abnormalities. Doppler parameters are consistent with abnormal   left ventricular relaxation (grade 1 diastolic dysfunction).   Doppler parameters are consistent with elevated ventricular   end-diastolic filling pressure. - Aortic valve: Trileaflet; moderately thickened, moderately   calcified leaflets. Valve mobility was restricted. There was mild   to moderate stenosis. There was no regurgitation. Mean gradient   (S): 16 mm Hg. Peak gradient (S): 23 mm Hg. - Aortic root: The aortic root was normal in size. - Mitral valve: There was trivial regurgitation. - Left atrium: The atrium was mildly dilated. - Right ventricle: Systolic function was normal. - Right atrium: The atrium was normal in size. - Tricuspid valve: There was mild regurgitation. - Pulmonary arteries: Systolic pressure was within the normal   range. - Inferior vena cava: The vessel was normal in size. - Pericardium, extracardiac: There was no pericardial effusion.    ASSESSMENT:    1. Coronary artery disease involving native coronary artery of native heart without angina pectoris   2. Aortic stenosis, mild   3. Hyperlipidemia LDL goal <70   4. Obesity (BMI 30.0-34.9)   5. Hypertension goal BP (blood pressure) < 140/90   6. Aortic aneurysm without rupture, unspecified portion of aorta (H[facility])      PLAN:  In order of problems listed above:  1. Overall doing well without any complaints to  suggest angina pectoris. 2. Mild aortic stenosis based upon echocardiogram done last year to evaluate a newly discovered systolic murmur.  She denies angina, syncope, and dyspnea.  Clinical follow-up in 1 year with a preceding 2D Doppler echocardiogram at that time. 3. LDL target should be less than 70. 4. Discussed continued aerobic activity to prevent progressive weight gain. 5. Target blood pressure 140/90 mmHg or less. 6. Continue low-dose beta-blocker therapy to prevent expansion of aortic root.  Clinical follow-up 1 year.  2D echocardiogram at that time to track progression of aortic stenosis.  Briefly discussed treatment options for aortic stenosis.  At her age this may never become a clinical problem requiring therapy.    Medication Adjustments/Labs and Tests Ordered: Current medicines are reviewed at length with the patient today.  Concerns regarding medicines are outlined above.  Medication changes, Labs and Tests ordered today are listed in the Patient Instructions below. Patient Instructions  Medication Instructions:  Your physician recommends that you continue on your current medications as directed. Please refer to the Current Medication list given to you today.  Labwork: None  Testing/Procedures: Your physician has requested that you have an echocardiogram 2-4 weeks prior to seeing Dr. Tamala Julian back in a year. Echocardiography is a painless test that uses sound waves to create images of your heart. It provides your doctor with information about the size and shape of your heart and how well your heart's chambers and valves are working. This procedure takes approximately one hour. There are no restrictions for this procedure.   Follow-Up: Your physician wants you to follow-up in: 1 year with Dr. Tamala Julian.  You will receive a reminder letter in the mail two months in advance. If you don't receive a letter, please call our office to schedule the follow-up appointment.   Any Other  Special Instructions Will Be Listed Below (If Applicable).     If  you need a refill on your cardiac medications before your next appointment, please call your pharmacy.      Signed, Sinclair Grooms, MD  02/22/2018 11:44 AM    [facility] [place], [place], [place]  [address] Phone: [phone]; Fax: [phone]

## 2018-02-22 ENCOUNTER — Encounter: Payer: Self-pay | Admitting: Interventional Cardiology

## 2018-02-22 ENCOUNTER — Ambulatory Visit (INDEPENDENT_AMBULATORY_CARE_PROVIDER_SITE_OTHER): Payer: Medicare Other | Admitting: Interventional Cardiology

## 2018-02-22 VITALS — BP 126/74 | HR 69 | Ht 64.0 in | Wt 191.2 lb

## 2018-02-22 DIAGNOSIS — E785 Hyperlipidemia, unspecified: Secondary | ICD-10-CM

## 2018-02-22 DIAGNOSIS — E669 Obesity, unspecified: Secondary | ICD-10-CM

## 2018-02-22 DIAGNOSIS — I350 Nonrheumatic aortic (valve) stenosis: Secondary | ICD-10-CM | POA: Diagnosis not present

## 2018-02-22 DIAGNOSIS — I10 Essential (primary) hypertension: Secondary | ICD-10-CM

## 2018-02-22 DIAGNOSIS — I719 Aortic aneurysm of unspecified site, without rupture: Secondary | ICD-10-CM

## 2018-02-22 DIAGNOSIS — H6123 Impacted cerumen, bilateral: Secondary | ICD-10-CM | POA: Diagnosis not present

## 2018-02-22 DIAGNOSIS — I2510 Atherosclerotic heart disease of native coronary artery without angina pectoris: Secondary | ICD-10-CM

## 2018-02-22 NOTE — Patient Instructions (Signed)
Medication Instructions:  Your physician recommends that you continue on your current medications as directed. Please refer to the Current Medication list given to you today.  Labwork: None  Testing/Procedures: Your physician has requested that you have an echocardiogram 2-4 weeks prior to seeing Dr. Tamala Julian back in a year. Echocardiography is a painless test that uses sound waves to create images of your heart. It provides your doctor with information about the size and shape of your heart and how well your heart's chambers and valves are working. This procedure takes approximately one hour. There are no restrictions for this procedure.   Follow-Up: Your physician wants you to follow-up in: 1 year with Dr. Tamala Julian.  You will receive a reminder letter in the mail two months in advance. If you don't receive a letter, please call our office to schedule the follow-up appointment.   Any Other Special Instructions Will Be Listed Below (If Applicable).     If you need a refill on your cardiac medications before your next appointment, please call your pharmacy.

## 2018-02-26 ENCOUNTER — Ambulatory Visit (INDEPENDENT_AMBULATORY_CARE_PROVIDER_SITE_OTHER): Payer: Medicare Other | Admitting: Urology

## 2018-02-26 ENCOUNTER — Other Ambulatory Visit: Payer: Self-pay

## 2018-02-26 ENCOUNTER — Encounter: Payer: Self-pay | Admitting: Urology

## 2018-02-26 VITALS — BP 111/72 | HR 73 | Ht 64.0 in | Wt 188.1 lb

## 2018-02-26 DIAGNOSIS — I2510 Atherosclerotic heart disease of native coronary artery without angina pectoris: Secondary | ICD-10-CM | POA: Diagnosis not present

## 2018-02-26 DIAGNOSIS — R339 Retention of urine, unspecified: Secondary | ICD-10-CM

## 2018-02-26 DIAGNOSIS — N289 Disorder of kidney and ureter, unspecified: Secondary | ICD-10-CM | POA: Diagnosis not present

## 2018-02-26 DIAGNOSIS — N3946 Mixed incontinence: Secondary | ICD-10-CM

## 2018-02-26 LAB — URINALYSIS, COMPLETE
Bilirubin, UA: NEGATIVE
Glucose, UA: NEGATIVE
Ketones, UA: NEGATIVE
Nitrite, UA: POSITIVE — AB
Specific Gravity, UA: 1.02 (ref 1.005–1.030)
Urobilinogen, Ur: 0.2 mg/dL (ref 0.2–1.0)
pH, UA: 5.5 (ref 5.0–7.5)

## 2018-02-26 LAB — BASIC METABOLIC PANEL
BUN: 23 mg/dL (ref 7–25)
CO2: 26 mmol/L (ref 20–32)
Calcium: 9.3 mg/dL (ref 8.6–10.4)
Chloride: 104 mmol/L (ref 98–110)
Creat: 0.77 mg/dL (ref 0.60–0.88)
Glucose, Bld: 75 mg/dL (ref 65–139)
Potassium: 4.2 mmol/L (ref 3.5–5.3)
Sodium: 138 mmol/L (ref 135–146)

## 2018-02-26 LAB — MICROSCOPIC EXAMINATION

## 2018-02-26 LAB — BLADDER SCAN AMB NON-IMAGING: Scan Result: 0

## 2018-02-26 MED ORDER — MIRABEGRON ER 50 MG PO TB24: 50.0000 mg | ORAL_TABLET | Freq: Every day | ORAL | 11 refills | Status: DC

## 2018-02-26 NOTE — Progress Notes (Signed)
02/26/2018 11:07 AM   Angel [name] [birth year] [id number]  Referring provider: Arnetha Courser, MD [address] [place], [place] [address]  Chief Complaint  Patient presents with  . New Patient (Initial Visit)    incomplete bladder emptying    HPI: The patient was followed by Dr. Jacqlyn Larsen for urgency incontinence.  She has had it for years.  She takes 2 imipramine at bedtime but reports minimal help.  She does not remember taking medication such as Chief Financial Officer.  She does not think she tried other medication.  She leaks a little bit with coughing and sneezing.  Incontinence worse later in the afternoon.  She has high volume bedwetting.  She can soak 2-3 pads a day but they amount varies.  She has had a bladder operation.  She is to get bladder infections.  She is up 3 times at night.  She voids every 2 hours  Modifying factors: There are no other modifying factors  Associated signs and symptoms: There are no other associated signs and symptoms Aggravating and relieving factors: There are no other aggravating or relieving factors Severity: Moderate Duration: Persistent   PMH: Past Medical History:  Diagnosis Date  . Anxiety   . Aortic aneurysm without rupture ([place])   . CAD (coronary artery disease)   . Chronic hip pain   . Endometriosis   . Hearing loss   . HTN, goal below 150/90   . Hyperlipidemia LDL goal <70 11/18/2014  . Osteopenia of the elderly   . Urge and stress incontinence     Surgical History: Past Surgical History:  Procedure Laterality Date  . ABDOMINAL HYSTERECTOMY  1976   total  . APPENDECTOMY    . BREAST EXCISIONAL BIOPSY Left 1987   benign    Home Medications:  Allergies as of 02/26/2018      Reactions   Codeine Other (See Comments)   "zoned out"   Levofloxacin Other (See Comments)   AMS   Prednisone Other (See Comments)   AMS   Sulfa Antibiotics Other (See Comments)   AMS      Medication List        Accurate as of 02/26/18 11:07 AM. Always use your most recent med list.          amLODipine 5 MG tablet Commonly known as:  NORVASC Take 1 tablet (5 mg total) by mouth daily.   atorvastatin 40 MG tablet Commonly known as:  LIPITOR Take 1 tablet (40 mg total) by mouth at bedtime.   clobetasol cream 0.05 % Commonly known as:  TEMOVATE Apply 1 application topically 2 (two) times daily as needed (rashes).   estrogens (conjugated) 0.3 MG tablet Commonly known as:  PREMARIN One by mouth three days per week   glucosamine-chondroitin 500-400 MG tablet Take 1 tablet by mouth daily.   imipramine 25 MG tablet Commonly known as:  TOFRANIL Take 25 mg by mouth at bedtime.   lisinopril 40 MG tablet Commonly known as:  PRINIVIL,ZESTRIL TAKE 1 TABLET BY MOUTH TWICE A DAY   metoprolol succinate 50 MG 24 hr tablet Commonly known as:  TOPROL-XL Take 1 tablet (50 mg total) by mouth daily. Take with or immediately following a meal.   multivitamin capsule Take 1 capsule by mouth daily.   vitamin B-12 100 MCG tablet Commonly known as:  CYANOCOBALAMIN Take 100 mcg by mouth daily.   vitamin C 100 MG tablet Take 100 mg by mouth daily.   VITAMIN  D (ERGOCALCIFEROL) PO Take 1,000 Units by mouth daily.       Allergies:  Allergies  Allergen Reactions  . Codeine Other (See Comments)    "zoned out"  . Levofloxacin Other (See Comments)    AMS  . Prednisone Other (See Comments)    AMS  . Sulfa Antibiotics Other (See Comments)    AMS    Family History: Family History  Problem Relation Age of Onset  . CVA Mother   . Stroke Mother   . Breast cancer Maternal Grandmother 60  . Heart disease Maternal Grandmother   . Cancer Brother        skin    Social History:  reports that she has never smoked. She has never used smokeless tobacco. She reports that she drinks about 0.6 oz of alcohol per week. She reports that she does not use drugs.  ROS: UROLOGY Frequent Urination?: Yes Hard  to postpone urination?: Yes Burning/pain with urination?: No Get up at night to urinate?: Yes Leakage of urine?: Yes Urine stream starts and stops?: No Trouble starting stream?: No Do you have to strain to urinate?: No Blood in urine?: No Urinary tract infection?: No Sexually transmitted disease?: No Injury to kidneys or bladder?: No Painful intercourse?: No Weak stream?: No Currently pregnant?: No Vaginal bleeding?: No Last menstrual period?: n  Gastrointestinal Nausea?: No Vomiting?: No Indigestion/heartburn?: No Diarrhea?: Yes Constipation?: Yes  Constitutional Fever: No Night sweats?: No Weight loss?: No Fatigue?: No  Skin Skin rash/lesions?: No Itching?: No  Eyes Blurred vision?: No Double vision?: No  Ears/Nose/Throat Sore throat?: No Sinus problems?: No  Hematologic/Lymphatic Swollen glands?: No Easy bruising?: Yes  Cardiovascular Leg swelling?: No Chest pain?: No  Respiratory Cough?: No Shortness of breath?: No  Endocrine Excessive thirst?: Yes  Musculoskeletal Back pain?: Yes Joint pain?: Yes  Neurological Headaches?: No Dizziness?: No  Psychologic Depression?: No Anxiety?: No  Physical Exam: BP 111/72 (BP Location: Left Arm, Patient Position: Sitting, Cuff Size: Large)   Pulse 73   Ht 5\' 4"  (1.626 m)   Wt 188 lb 1.6 oz (85.3 kg)   LMP  (LMP Unknown)   BMI 32.29 kg/m   Constitutional:  Alert and oriented, No acute distress. HEENT: [place] AT, moist mucus membranes.  Trachea midline, no masses. Cardiovascular: No clubbing, cyanosis, or edema. Respiratory: Normal respiratory effort, no increased work of breathing. GI: Abdomen is soft, nontender, nondistended, no abdominal masses GU: No CVA tenderness.  No bladder tenderness Skin: No rashes, bruises or suspicious lesions. Lymph: No cervical or inguinal adenopathy. Neurologic: Grossly intact, no focal deficits, moving all 4 extremities. Psychiatric: Normal mood and  affect.  Laboratory Data: Lab Results  Component Value Date   WBC 6.4 01/18/2018   HGB 13.0 01/18/2018   HCT 38.0 01/18/2018   MCV 92.7 01/18/2018   PLT 286 01/18/2018    Lab Results  Component Value Date   CREATININE 1.05 (H) 01/18/2018    No results found for: PSA  No results found for: TESTOSTERONE  No results found for: HGBA1C  Urinalysis No results found for: COLORURINE, APPEARANCEUR, LABSPEC, PHURINE, GLUCOSEU, HGBUR, BILIRUBINUR, KETONESUR, PROTEINUR, UROBILINOGEN, NITRITE, LEUKOCYTESUR  Pertinent Imaging:   Assessment & Plan: The patient was tried on Myrbetriq 50 mg samples and prescription.  Reassess in about 6 weeks.  Send urine for culture.  Other OAB medications and percutaneous tibial nerve stimulation may be options.  1. Incomplete bladder emptying  - Urinalysis, Complete - Bladder Scan (Post Void Residual) in office  No follow-ups on file.  Reece Packer, MD  [facility] [address] [place], [place] [address] [phone]

## 2018-02-27 DIAGNOSIS — M9903 Segmental and somatic dysfunction of lumbar region: Secondary | ICD-10-CM | POA: Diagnosis not present

## 2018-02-27 DIAGNOSIS — M9901 Segmental and somatic dysfunction of cervical region: Secondary | ICD-10-CM | POA: Diagnosis not present

## 2018-02-27 DIAGNOSIS — M62830 Muscle spasm of back: Secondary | ICD-10-CM | POA: Diagnosis not present

## 2018-02-27 DIAGNOSIS — M5033 Other cervical disc degeneration, cervicothoracic region: Secondary | ICD-10-CM | POA: Diagnosis not present

## 2018-02-28 LAB — CULTURE, URINE COMPREHENSIVE

## 2018-03-12 DIAGNOSIS — M62830 Muscle spasm of back: Secondary | ICD-10-CM | POA: Diagnosis not present

## 2018-03-12 DIAGNOSIS — M5033 Other cervical disc degeneration, cervicothoracic region: Secondary | ICD-10-CM | POA: Diagnosis not present

## 2018-03-12 DIAGNOSIS — M9903 Segmental and somatic dysfunction of lumbar region: Secondary | ICD-10-CM | POA: Diagnosis not present

## 2018-03-12 DIAGNOSIS — M9901 Segmental and somatic dysfunction of cervical region: Secondary | ICD-10-CM | POA: Diagnosis not present

## 2018-03-19 DIAGNOSIS — M5033 Other cervical disc degeneration, cervicothoracic region: Secondary | ICD-10-CM | POA: Diagnosis not present

## 2018-03-19 DIAGNOSIS — M9903 Segmental and somatic dysfunction of lumbar region: Secondary | ICD-10-CM | POA: Diagnosis not present

## 2018-03-19 DIAGNOSIS — M9901 Segmental and somatic dysfunction of cervical region: Secondary | ICD-10-CM | POA: Diagnosis not present

## 2018-03-19 DIAGNOSIS — M62830 Muscle spasm of back: Secondary | ICD-10-CM | POA: Diagnosis not present

## 2018-03-23 ENCOUNTER — Other Ambulatory Visit: Payer: Self-pay | Admitting: Interventional Cardiology

## 2018-03-23 NOTE — Telephone Encounter (Signed)
Lipids managed by pcp and previous refills with the exception of the most recent one were refilled by pcp. Patient has a recent lipid panel in epic.  Okay to continue to refill? Please advise. Thanks, MI

## 2018-03-23 NOTE — Telephone Encounter (Signed)
Ok to fill 

## 2018-03-26 DIAGNOSIS — M62830 Muscle spasm of back: Secondary | ICD-10-CM | POA: Diagnosis not present

## 2018-03-26 DIAGNOSIS — M9901 Segmental and somatic dysfunction of cervical region: Secondary | ICD-10-CM | POA: Diagnosis not present

## 2018-03-26 DIAGNOSIS — M9903 Segmental and somatic dysfunction of lumbar region: Secondary | ICD-10-CM | POA: Diagnosis not present

## 2018-03-26 DIAGNOSIS — M5033 Other cervical disc degeneration, cervicothoracic region: Secondary | ICD-10-CM | POA: Diagnosis not present

## 2018-04-16 ENCOUNTER — Ambulatory Visit: Payer: Medicare Other | Admitting: Urology

## 2018-04-16 ENCOUNTER — Other Ambulatory Visit: Payer: Self-pay | Admitting: Family Medicine

## 2018-04-24 DIAGNOSIS — M9901 Segmental and somatic dysfunction of cervical region: Secondary | ICD-10-CM | POA: Diagnosis not present

## 2018-04-24 DIAGNOSIS — M62830 Muscle spasm of back: Secondary | ICD-10-CM | POA: Diagnosis not present

## 2018-04-24 DIAGNOSIS — M5033 Other cervical disc degeneration, cervicothoracic region: Secondary | ICD-10-CM | POA: Diagnosis not present

## 2018-04-24 DIAGNOSIS — M9903 Segmental and somatic dysfunction of lumbar region: Secondary | ICD-10-CM | POA: Diagnosis not present

## 2018-05-07 ENCOUNTER — Other Ambulatory Visit: Payer: Self-pay | Admitting: Family Medicine

## 2018-05-15 DIAGNOSIS — M5033 Other cervical disc degeneration, cervicothoracic region: Secondary | ICD-10-CM | POA: Diagnosis not present

## 2018-05-15 DIAGNOSIS — M9901 Segmental and somatic dysfunction of cervical region: Secondary | ICD-10-CM | POA: Diagnosis not present

## 2018-05-15 DIAGNOSIS — M9903 Segmental and somatic dysfunction of lumbar region: Secondary | ICD-10-CM | POA: Diagnosis not present

## 2018-05-15 DIAGNOSIS — M62830 Muscle spasm of back: Secondary | ICD-10-CM | POA: Diagnosis not present

## 2018-05-17 ENCOUNTER — Encounter: Payer: Self-pay | Admitting: Family Medicine

## 2018-05-17 ENCOUNTER — Ambulatory Visit (INDEPENDENT_AMBULATORY_CARE_PROVIDER_SITE_OTHER): Payer: Medicare Other | Admitting: Family Medicine

## 2018-05-17 VITALS — BP 124/78 | HR 88 | Temp 98.1°F | Ht 64.0 in | Wt 191.3 lb

## 2018-05-17 DIAGNOSIS — Z23 Encounter for immunization: Secondary | ICD-10-CM | POA: Diagnosis not present

## 2018-05-17 DIAGNOSIS — R339 Retention of urine, unspecified: Secondary | ICD-10-CM

## 2018-05-17 DIAGNOSIS — N3941 Urge incontinence: Secondary | ICD-10-CM | POA: Diagnosis not present

## 2018-05-17 DIAGNOSIS — M170 Bilateral primary osteoarthritis of knee: Secondary | ICD-10-CM

## 2018-05-17 DIAGNOSIS — I2510 Atherosclerotic heart disease of native coronary artery without angina pectoris: Secondary | ICD-10-CM | POA: Diagnosis not present

## 2018-05-17 DIAGNOSIS — H259 Unspecified age-related cataract: Secondary | ICD-10-CM

## 2018-05-17 DIAGNOSIS — H269 Unspecified cataract: Secondary | ICD-10-CM | POA: Insufficient documentation

## 2018-05-17 MED ORDER — DICLOFENAC SODIUM 1 % TD GEL: 2.0000 g | Freq: Four times a day (QID) | TRANSDERMAL | 5 refills | Status: DC

## 2018-05-17 NOTE — Progress Notes (Signed)
BP 124/78   Pulse 88   Temp 98.1 F (36.7 C)   Ht 5\' 4"  (1.626 m)   Wt 191 lb 4.8 oz (86.8 kg)   LMP  (LMP Unknown)   SpO2 96%   BMI 32.84 kg/m    Subjective:    Patient ID: Angel [name], female    DOB: October 08, 1930, 84 y.o.   MRN: [id number]  HPI: Angel [name] is a 84 y.o. female  Chief Complaint  Patient presents with  . Knee Pain    Bilateral    HPI She is here for a "geriatric lesson from you" she says She brought a list of questions  Arthritis She is struggling with arthritis in her knees; she has seen an orthopaedist, Dr. Marry Guan; "lovely guy, precious"; did xrays and she still has cartilage in her knees; then she was going to go to [place], [place] and [place] so he told her to go and have a great time; it was a struggle but she did it; she struggles with stairs; no injection in the knee; now she says the arthritis is not getting any better; she sees chiropractor, Dr. Joyce Copa says her pelvis is twisted; he said to ask PCP about injections might be appropriate She has tried tylenol and that is her preferred drug, not very often She is no longer taking ibuprofen, concern about kidneys [facility]; tries to walk 20 minutes a day at least She can easily make it through the day; she tries to not dwell on her pain; feels it in her hips and her knees  Incontinence She went to the urologist, Dr. Jacqlyn Larsen (but he moved); she had surgery twice with him for incontinence; she was taking imipramine; their office offered to continue the [place] until she could find new urologist; we talked about local urologist, and she made an appt, saw a doctor; she was given Mybetriq but it caused her deep depression; she is not given to depression; stopped it immediately Her incontinence is now worse; she does not think she has a bladder infection; she would like to see another urologist  Cataract, right eye She will be seeing Dr. Macarthur Critchley,  ophthalmologist; she has gotten wonderful recommendations about him; December 6th for cataract on the right eye   Depression screen [facility] 2/9 05/17/2018 01/18/2018 06/01/2017 12/02/2016 08/04/2016  Decreased Interest 0 0 0 0 0  Down, Depressed, Hopeless 0 0 0 0 0  PHQ - 2 Score 0 0 0 0 0   Fall Risk  05/17/2018 01/18/2018 06/01/2017 12/02/2016 08/04/2016  Falls in the past year? No Yes No No No  Number falls in past yr: - 1 - - -  Injury with Fall? - Yes - - -    Relevant past medical, surgical, family and social history reviewed Past Medical History:  Diagnosis Date  . Anxiety   . Aortic aneurysm without rupture ([place])   . CAD (coronary artery disease)   . Chronic hip pain   . Endometriosis   . Hearing loss   . HTN, goal below 150/90   . Hyperlipidemia LDL goal <70 11/18/2014  . Osteopenia of the elderly   . Urge and stress incontinence    Past Surgical History:  Procedure Laterality Date  . ABDOMINAL HYSTERECTOMY  1976   total  . APPENDECTOMY    . BREAST EXCISIONAL BIOPSY Left 1987   benign   Family History  Problem Relation  Age of Onset  . CVA Mother   . Stroke Mother   . Breast cancer Maternal Grandmother 60  . Heart disease Maternal Grandmother   . Cancer Brother        skin   Social History   Tobacco Use  . Smoking status: Never Smoker  . Smokeless tobacco: Never Used  Substance Use Topics  . Alcohol use: Yes    Alcohol/week: 1.0 standard drinks    Types: 1 Cans of beer per week    Comment: occasional  . Drug use: No     [facility] Visit from 05/17/2018 in [facility]  AUDIT-C Score  1      Interim medical history since last visit reviewed. Allergies and medications reviewed  Review of Systems Per HPI unless specifically indicated above     Objective:    BP 124/78   Pulse 88   Temp 98.1 F (36.7 C)   Ht 5\' 4"  (1.626 m)   Wt 191 lb 4.8 oz (86.8 kg)   LMP  (LMP Unknown)   SpO2 96%   BMI 32.84 kg/m   Wt Readings from Last 3  Encounters:  05/17/18 191 lb 4.8 oz (86.8 kg)  02/26/18 188 lb 1.6 oz (85.3 kg)  02/22/18 191 lb 3.2 oz (86.7 kg)    Physical Exam  Constitutional: She appears well-developed and well-nourished.  HENT:  Mouth/Throat: Mucous membranes are normal.  Eyes: EOM are normal. No scleral icterus.  Cardiovascular: Normal rate and regular rhythm.  Pulmonary/Chest: Effort normal and breath sounds normal.  Musculoskeletal:       Right knee: She exhibits decreased range of motion.       Left knee: She exhibits decreased range of motion.  Psychiatric: She has a normal mood and affect. Her behavior is normal.    Results for orders placed or performed in visit on 00/86/76  Basic Metabolic Panel (BMET)  Result Value Ref Range   Glucose, Bld 75 65 - 139 mg/dL   BUN 23 7 - 25 mg/dL   Creat 0.77 0.60 - 0.88 mg/dL   BUN/Creatinine Ratio NOT APPLICABLE 6 - 22 (calc)   Sodium 138 135 - 146 mmol/L   Potassium 4.2 3.5 - 5.3 mmol/L   Chloride 104 98 - 110 mmol/L   CO2 26 20 - 32 mmol/L   Calcium 9.3 8.6 - 10.4 mg/dL      Assessment & Plan:   Problem List Items Addressed This Visit      Musculoskeletal and Integument   Arthritis of knee, degenerative - Primary    Encouraged weigh tloss, "T" measures (temperature, turmeric, tylenol, therapy, topicals)        Other   Urge incontinence of urine    Refer to Zara Council, PA      Relevant Orders   Ambulatory referral to Urology   Incomplete bladder emptying    Refer to urologist; recommended avoiding caffeine and chocolate and tea      Relevant Orders   Ambulatory referral to Urology   Cataract, right eye    Patient to see ophthalmologist       Other Visit Diagnoses    Need for influenza vaccination       Relevant Orders   Flu vaccine HIGH DOSE PF (Fluzone High dose) (Completed)       Follow up plan: No follow-ups on file.  An after-visit summary was printed and given to the patient at [place].  Please see the patient  instructions which  may contain other information and recommendations beyond what is mentioned above in the assessment and plan.  Meds ordered this encounter  Medications  . diclofenac sodium (VOLTAREN) 1 % GEL    Sig: Apply 2-4 g topically 4 (four) times daily.    Dispense:  100 g    Refill:  5    Orders Placed This Encounter  Procedures  . Flu vaccine HIGH DOSE PF (Fluzone High dose)  . Ambulatory referral to Urology

## 2018-05-17 NOTE — Assessment & Plan Note (Signed)
Patient to see ophthalmologist

## 2018-05-17 NOTE — Assessment & Plan Note (Signed)
Refer to Zara Council, PA

## 2018-05-17 NOTE — Assessment & Plan Note (Signed)
Refer to urologist; recommended avoiding caffeine and chocolate and tea

## 2018-05-17 NOTE — Patient Instructions (Addendum)
[facility] staff -- request records from Dr. Marry Guan  Try turmeric as a natural anti-inflammatory (for pain and arthritis). It comes in capsules where you buy aspirin and fish oil, but also as a spice where you buy pepper and garlic powder.  If you need something for aches or pains, try to use Tylenol (acetaminophen) instead of non-steroidals (which include Aleve, ibuprofen, Advil, Motrin, and naproxen); non-steroidals can cause long-term kidney damage  Temperature: ice applications 20 minutes or so, 3-4 x a day, cloth between the ice and the skin  Topicals: over-the-counter products or I can prescribe a topical NSAID called diclofenac  Therapy: physical therapy exercises can help; call me if you'd like a referral  Try to work on weight loss Check out the information at familydoctor.org entitled "Nutrition for Weight Loss: What You Need to Know about Fad Diets" Try to lose between 1-2 pounds per week by taking in fewer calories and burning off more calories You can succeed by limiting portions, limiting foods dense in calories and fat, becoming more active, and drinking 8 glasses of water a day (64 ounces) Don't skip meals, especially breakfast, as skipping meals may alter your metabolism Do not use over-the-counter weight loss pills or gimmicks that claim rapid weight loss A healthy BMI (or body mass index) is between 18.5 and 24.9 You can calculate your ideal BMI at the [place] website ClubMonetize.fr  Do return to see Dr. Marry Guan if our measures are not improving   Osteoarthritis Osteoarthritis is a type of arthritis that affects tissue that covers the ends of bones in joints (cartilage). Cartilage acts as a cushion between the bones and helps them move smoothly. Osteoarthritis results when cartilage in the joints gets worn down. Osteoarthritis is sometimes called "wear and tear" arthritis. Osteoarthritis is the most common form of  arthritis. It often occurs in older people. It is a condition that gets worse over time (a progressive condition). Joints that are most often affected by this condition are in:  Fingers.  Toes.  Hips.  Knees.  Spine, including neck and lower back.  What are the causes? This condition is caused by age-related wearing down of cartilage that covers the ends of bones. What increases the risk? The following factors may make you more likely to develop this condition:  Older age.  Being overweight or obese.  Overuse of joints, such as in athletes.  Past injury of a joint.  Past surgery on a joint.  Family history of osteoarthritis.  What are the signs or symptoms? The main symptoms of this condition are pain, swelling, and stiffness in the joint. The joint may lose its shape over time. Small pieces of bone or cartilage may break off and float inside of the joint, which may cause more pain and damage to the joint. Small deposits of bone (osteophytes) may grow on the edges of the joint. Other symptoms may include:  A grating or scraping feeling inside the joint when you move it.  Popping or creaking sounds when you move.  Symptoms may affect one or more joints. Osteoarthritis in a major joint, such as your knee or hip, can make it painful to walk or exercise. If you have osteoarthritis in your hands, you might not be able to grip items, twist your hand, or control small movements of your hands and fingers (fine motor skills). How is this diagnosed? This condition may be diagnosed based on:  Your medical history.  A physical exam.  Your symptoms.  X-rays of  the affected joint(s).  Blood tests to rule out other types of arthritis.  How is this treated? There is no cure for this condition, but treatment can help to control pain and improve joint function. Treatment plans may include:  A prescribed exercise program that allows for rest and joint relief. You may work with a  physical therapist.  A weight control plan.  Pain relief techniques, such as: ? Applying heat and cold to the joint. ? Electric pulses delivered to nerve endings under the skin (transcutaneous electrical nerve stimulation, or TENS). ? Massage. ? Certain nutritional supplements.  NSAIDs or prescription medicines to help relieve pain.  Medicine to help relieve pain and inflammation (corticosteroids). This can be given by mouth (orally) or as an injection.  Assistive devices, such as a brace, wrap, splint, specialized glove, or cane.  Surgery, such as: ? An osteotomy. This is done to reposition the bones and relieve pain or to remove loose pieces of bone and cartilage. ? Joint replacement surgery. You may need this surgery if you have very bad (advanced) osteoarthritis.  Follow these instructions at home: Activity  Rest your affected joints as directed by your health care provider.  Do not drive or use heavy machinery while taking prescription pain medicine.  Exercise as directed. Your health care provider or physical therapist may recommend specific types of exercise, such as: ? Strengthening exercises. These are done to strengthen the muscles that support joints that are affected by arthritis. They can be performed with weights or with exercise bands to add resistance. ? Aerobic activities. These are exercises, such as brisk walking or water aerobics, that get your heart pumping. ? Range-of-motion activities. These keep your joints easy to move. ? Balance and agility exercises. Managing pain, stiffness, and swelling  If directed, apply heat to the affected area as often as told by your health care provider. Use the heat source that your health care provider recommends, such as a moist heat pack or a heating pad. ? If you have a removable assistive device, remove it as told by your health care provider. ? Place a towel between your skin and the heat source. If your health care  provider tells you to keep the assistive device on while you apply heat, place a towel between the assistive device and the heat source. ? Leave the heat on for 20-30 minutes. ? Remove the heat if your skin turns bright red. This is especially important if you are unable to feel pain, heat, or cold. You may have a greater risk of getting burned.  If directed, put ice on the affected joint: ? If you have a removable assistive device, remove it as told by your health care provider. ? Put ice in a plastic bag. ? Place a towel between your skin and the bag. If your health care provider tells you to keep the assistive device on during icing, place a towel between the assistive device and the bag. ? Leave the ice on for 20 minutes, 2-3 times a day. General instructions  Take over-the-counter and prescription medicines only as told by your health care provider.  Maintain a healthy weight. Follow instructions from your health care provider for weight control. These may include dietary restrictions.  Do not use any products that contain nicotine or tobacco, such as cigarettes and e-cigarettes. These can delay bone healing. If you need help quitting, ask your health care provider.  Use assistive devices as directed by your health care provider.  Keep all follow-up visits as told by your health care provider. This is important. Where to find more information:  [employer] of Arthritis and Musculoskeletal and Skin Diseases: www.niams.SouthExposed.es  [employer] on Aging: http://kim-miller.com/  American College of Rheumatology: www.rheumatology.org Contact a health care provider if:  Your skin turns red.  You develop a rash.  You have pain that gets worse.  You have a fever along with joint or muscle aches. Get help right away if:  You lose a lot of weight.  You suddenly lose your appetite.  You have night sweats. Summary  Osteoarthritis is a type of arthritis that affects tissue  covering the ends of bones in joints (cartilage).  This condition is caused by age-related wearing down of cartilage that covers the ends of bones.  The main symptom of this condition is pain, swelling, and stiffness in the joint.  There is no cure for this condition, but treatment can help to control pain and improve joint function. This information is not intended to replace advice given to you by your health care provider. Make sure you discuss any questions you have with your health care provider. Document Released: 07/25/2005 Document Revised: 03/28/2016 Document Reviewed: 03/28/2016 Elsevier Interactive Patient Education  [employer].

## 2018-05-22 DIAGNOSIS — M179 Osteoarthritis of knee, unspecified: Secondary | ICD-10-CM | POA: Insufficient documentation

## 2018-05-22 DIAGNOSIS — M9903 Segmental and somatic dysfunction of lumbar region: Secondary | ICD-10-CM | POA: Diagnosis not present

## 2018-05-22 DIAGNOSIS — M62830 Muscle spasm of back: Secondary | ICD-10-CM | POA: Diagnosis not present

## 2018-05-22 DIAGNOSIS — M9901 Segmental and somatic dysfunction of cervical region: Secondary | ICD-10-CM | POA: Diagnosis not present

## 2018-05-22 DIAGNOSIS — M5033 Other cervical disc degeneration, cervicothoracic region: Secondary | ICD-10-CM | POA: Diagnosis not present

## 2018-05-22 DIAGNOSIS — M1710 Unilateral primary osteoarthritis, unspecified knee: Secondary | ICD-10-CM | POA: Insufficient documentation

## 2018-05-22 NOTE — Assessment & Plan Note (Signed)
Encouraged weigh tloss, "T" measures (temperature, turmeric, tylenol, therapy, topicals)

## 2018-05-27 ENCOUNTER — Encounter: Payer: Self-pay | Admitting: Family Medicine

## 2018-05-29 DIAGNOSIS — M9901 Segmental and somatic dysfunction of cervical region: Secondary | ICD-10-CM | POA: Diagnosis not present

## 2018-05-29 DIAGNOSIS — M9903 Segmental and somatic dysfunction of lumbar region: Secondary | ICD-10-CM | POA: Diagnosis not present

## 2018-05-29 DIAGNOSIS — M62830 Muscle spasm of back: Secondary | ICD-10-CM | POA: Diagnosis not present

## 2018-05-29 DIAGNOSIS — M5033 Other cervical disc degeneration, cervicothoracic region: Secondary | ICD-10-CM | POA: Diagnosis not present

## 2018-05-31 ENCOUNTER — Ambulatory Visit: Payer: Medicare Other | Admitting: Urology

## 2018-06-05 DIAGNOSIS — M9903 Segmental and somatic dysfunction of lumbar region: Secondary | ICD-10-CM | POA: Diagnosis not present

## 2018-06-05 DIAGNOSIS — M9901 Segmental and somatic dysfunction of cervical region: Secondary | ICD-10-CM | POA: Diagnosis not present

## 2018-06-05 DIAGNOSIS — M5033 Other cervical disc degeneration, cervicothoracic region: Secondary | ICD-10-CM | POA: Diagnosis not present

## 2018-06-05 DIAGNOSIS — M62830 Muscle spasm of back: Secondary | ICD-10-CM | POA: Diagnosis not present

## 2018-06-07 ENCOUNTER — Ambulatory Visit (INDEPENDENT_AMBULATORY_CARE_PROVIDER_SITE_OTHER): Payer: Medicare Other | Admitting: Urology

## 2018-06-07 ENCOUNTER — Encounter: Payer: Self-pay | Admitting: Urology

## 2018-06-07 VITALS — BP 117/78 | HR 98 | Ht 64.0 in | Wt 184.7 lb

## 2018-06-07 DIAGNOSIS — N3946 Mixed incontinence: Secondary | ICD-10-CM | POA: Diagnosis not present

## 2018-06-07 DIAGNOSIS — R350 Frequency of micturition: Secondary | ICD-10-CM

## 2018-06-07 DIAGNOSIS — R339 Retention of urine, unspecified: Secondary | ICD-10-CM | POA: Diagnosis not present

## 2018-06-07 DIAGNOSIS — I2510 Atherosclerotic heart disease of native coronary artery without angina pectoris: Secondary | ICD-10-CM

## 2018-06-07 LAB — URINALYSIS, COMPLETE
Bilirubin, UA: NEGATIVE
Glucose, UA: NEGATIVE
Ketones, UA: NEGATIVE
Leukocytes, UA: NEGATIVE
Nitrite, UA: NEGATIVE
Protein, UA: NEGATIVE
RBC, UA: NEGATIVE
Specific Gravity, UA: 1.02 (ref 1.005–1.030)
Urobilinogen, Ur: 0.2 mg/dL (ref 0.2–1.0)
pH, UA: 5.5 (ref 5.0–7.5)

## 2018-06-07 LAB — BLADDER SCAN AMB NON-IMAGING: Scan Result: 0

## 2018-06-07 LAB — MICROSCOPIC EXAMINATION
RBC, UA: NONE SEEN /hpf (ref 0–2)
WBC, UA: NONE SEEN /hpf (ref 0–5)

## 2018-06-07 NOTE — Progress Notes (Signed)
06/07/2018 4:35 PM   Angel [name] [birth year] [id number]  Referring provider: Arnetha Courser, [address] [place], [place] [address]  No chief complaint on file.   HPI: Patient is a 84 -year-old Caucasian female who is referred to [place] by Dr. Sanda Klein for urinary incontinence.  Patient states that she has had urinary incontinence for several years.    Patient has incontinence with urge and stress.  Her incontinence volume is moderate.   She is wearing 5 pads/depends daily.    She is having associated urinary frequency x10, urgency, nocturia x 2-3 and intermittency.   Patient denies any gross hematuria, dysuria or suprapubic/flank pain.  Patient denies any fevers, chills, nausea or vomiting.   Her UA is negative and her PVR is 0 mL.    She does not have a history of urinary tract infections, STI's or injury to the bladder.   She states that Dr. Jacqlyn Larsen had injected some material into her bladder for incontinence.  She does not have a history of nephrolithiasis or GU trauma.   She was seen by Dr. Matilde Sprang in July and given Myrbetriq 50 mg samples, unfortunately they caused her to have depression and she had to discontinue the medication.    She is post menopausal.   She is drinking 4 glasses of water daily.   She is drinking is one cup and one half of decaf coffee.  One glass of diet GingerAle in the afternoon.  She is drinking herbal teas that are caffeine free. No juices.  No energy drinks.  She drinks one alcohol beverage on the weekend.       PMH: Past Medical History:  Diagnosis Date  . Anxiety   . Aortic aneurysm without rupture ([place])   . CAD (coronary artery disease)   . Chronic hip pain   . Endometriosis   . Hearing loss   . HTN, goal below 150/90   . Hyperlipidemia LDL goal <70 11/18/2014  . Osteopenia of the elderly   . Urge and stress incontinence     Surgical History: Past Surgical History:  Procedure Laterality Date  . ABDOMINAL HYSTERECTOMY   1976   total  . APPENDECTOMY    . BREAST EXCISIONAL BIOPSY Left 1987   benign    Home Medications:  Allergies as of 06/07/2018      Reactions   Myrbetriq [mirabegron] Other (See Comments)   Severe depression   Codeine Other (See Comments)   "zoned out"   Levofloxacin Other (See Comments)   AMS   Prednisone Other (See Comments)   AMS   Sulfa Antibiotics Other (See Comments)   AMS      Medication List        Accurate as of 06/07/18  4:35 PM. Always use your most recent med list.          amLODipine 5 MG tablet Commonly known as:  NORVASC TAKE 1 TABLET BY MOUTH DAILY   atorvastatin 40 MG tablet Commonly known as:  LIPITOR TAKE ONE TABLET BY MOUTH EVERY NIGHT AT BEDTIME   clobetasol cream 0.05 % Commonly known as:  TEMOVATE Apply 1 application topically 2 (two) times daily as needed (rashes).   diclofenac sodium 1 % Gel Commonly known as:  VOLTAREN Apply 2-4 g topically 4 (four) times daily.   estrogens (conjugated) 0.3 MG tablet Commonly known as:  PREMARIN TAKE 1 TABLET BY MOUTH 3 DAYS PER WEEK   glucosamine-chondroitin 500-400 MG tablet Take 1  tablet by mouth daily.   imipramine 25 MG tablet Commonly known as:  TOFRANIL Take 25 mg by mouth at bedtime.   lisinopril 40 MG tablet Commonly known as:  PRINIVIL,ZESTRIL TAKE 1 TABLET BY MOUTH TWICE A DAY   metoprolol succinate 50 MG 24 hr tablet Commonly known as:  TOPROL-XL TAKE 1 TABLET BY MOUTH DAILY WITH OR IMMEDIATELY FOLLOWING A MEAL   mirabegron ER 50 MG Tb24 tablet Commonly known as:  MYRBETRIQ Take 1 tablet (50 mg total) by mouth daily.   multivitamin capsule Take 1 capsule by mouth daily.   vitamin B-12 100 MCG tablet Commonly known as:  CYANOCOBALAMIN Take 100 mcg by mouth daily.   vitamin C 100 MG tablet Take 100 mg by mouth daily.   VITAMIN D (ERGOCALCIFEROL) PO Take 1,000 Units by mouth daily.       Allergies:  Allergies  Allergen Reactions  . Myrbetriq [Mirabegron] Other  (See Comments)    Severe depression  . Codeine Other (See Comments)    "zoned out"  . Levofloxacin Other (See Comments)    AMS  . Prednisone Other (See Comments)    AMS  . Sulfa Antibiotics Other (See Comments)    AMS    Family History: Family History  Problem Relation Age of Onset  . CVA Mother   . Stroke Mother   . Breast cancer Maternal Grandmother 60  . Heart disease Maternal Grandmother   . Cancer Brother        skin    Social History:  reports that she has never smoked. She has never used smokeless tobacco. She reports that she drinks about 1.0 standard drinks of alcohol per week. She reports that she does not use drugs.  ROS: UROLOGY Frequent Urination?: No Hard to postpone urination?: No Burning/pain with urination?: No Get up at night to urinate?: Yes Leakage of urine?: Yes Urine stream starts and stops?: Yes Trouble starting stream?: No Do you have to strain to urinate?: No Blood in urine?: No Urinary tract infection?: No Sexually transmitted disease?: No Injury to kidneys or bladder?: No Painful intercourse?: No Weak stream?: No Currently pregnant?: No Vaginal bleeding?: No Last menstrual period?: n  Gastrointestinal Nausea?: No Vomiting?: No Indigestion/heartburn?: No Diarrhea?: No Constipation?: No  Constitutional Fever: No Night sweats?: No Weight loss?: No Fatigue?: No  Skin Skin rash/lesions?: No Itching?: No  Eyes Blurred vision?: No Double vision?: No  Ears/Nose/Throat Sore throat?: No Sinus problems?: No  Hematologic/Lymphatic Swollen glands?: No Easy bruising?: No  Cardiovascular Leg swelling?: No Chest pain?: No  Respiratory Cough?: No Shortness of breath?: No  Endocrine Excessive thirst?: Yes  Musculoskeletal Back pain?: No Joint pain?: No  Neurological Headaches?: No Dizziness?: No  Psychologic Depression?: No Anxiety?: No  Physical Exam: BP 117/78 (BP Location: Left Arm, Patient Position:  Sitting, Cuff Size: Normal)   Pulse 98   Ht 5\' 4"  (1.626 m)   Wt 184 lb 11.2 oz (83.8 kg)   LMP  (LMP Unknown)   BMI 31.70 kg/m   Constitutional: Well nourished. Alert and oriented, No acute distress. HEENT: [place] AT, moist mucus membranes. Trachea midline, no masses. Cardiovascular: No clubbing, cyanosis, or edema. Respiratory: Normal respiratory effort, no increased work of breathing. GI: Abdomen is soft, non tender, non distended, no abdominal masses. Liver and spleen not palpable.  No hernias appreciated.  Stool sample for occult testing is not indicated.   GU: No CVA tenderness.  No bladder fullness or masses.  Atrophic external genitalia, normal  pubic hair distribution, no lesions.  Normal urethral meatus, no lesions, no prolapse, no discharge.   No urethral masses, tenderness and/or tenderness. No bladder fullness, tenderness or masses.  Pale vagina mucosa, fair estrogen effect, no discharge, no lesions, fair pelvic support, grade I cystocele and rectocele noted.  Cervix, uterus and adnexa are surgically absent.  Anus and perineum are without rashes or lesions.    Skin: No rashes, bruises or suspicious lesions. Lymph: No cervical or inguinal adenopathy. Neurologic: Grossly intact, no focal deficits, moving all 4 extremities. Psychiatric: Normal mood and affect.  Laboratory Data: Lab Results  Component Value Date   WBC 6.4 01/18/2018   HGB 13.0 01/18/2018   HCT 38.0 01/18/2018   MCV 92.7 01/18/2018   PLT 286 01/18/2018    Lab Results  Component Value Date   CREATININE 0.77 02/26/2018    No results found for: PSA  No results found for: TESTOSTERONE  No results found for: HGBA1C  Lab Results  Component Value Date   TSH 1.44 01/18/2018       Component Value Date/Time   CHOL 169 01/18/2018 1416   CHOL 170 01/06/2016 0839   HDL 52 01/18/2018 1416   HDL 53 01/06/2016 0839   CHOLHDL 3.3 01/18/2018 1416   VLDL 70 (H) 08/04/2016 1601   LDLCALC 82 01/18/2018 1416     Lab Results  Component Value Date   AST 23 01/18/2018   Lab Results  Component Value Date   ALT 25 01/18/2018   No components found for: ALKALINEPHOPHATASE No components found for: BILIRUBINTOTAL  No results found for: ESTRADIOL  Urinalysis Moderate bacteria.  See Epic.  I have reviewed the labs.   Pertinent Imaging: Results for DAYANE, [place] (MRN [id number]) as of 06/07/2018 16:33  Ref. Range 06/07/2018 15:01  Scan Result Unknown 0    Assessment & Plan:    1. Frequency/ Mixed Incontinence Discussed behavioral therapies, bladder training and bladder control strategies Failed pelvic floor muscle training Discussed fluid management  Failed medical therapy  explained the PTNS provides treatment by indirectly providing electrical stimulation to the nerves responsible for bladder and pelvic floor function - a needle electrode generates an adjustable electrical pulse that travels to the sacral plexus via the tibial nerve which is located in the ankle, among other functions, the sacral nerve plexus regulates bladder and pelvic floor function - treatment protocol requires once-a-week treatments for 12 weeks, 30 minutes per session and many patients begin to see improvements by the 6th treatment. Patients who respond to treatment may require occasional treatments (~ once every 3 weeks) to sustain improvements. PTNS is a low-risk procedure. The most common side-effects with PTNS treatment are temporary and minor, resulting from the placement of the needle electrode. They include minor bleeding, mild pain and skin inflammation and patients have seen up to an 80% success rate with this form of treatment RTC for PTNS Urinalysis, Complete Bladder Scan (Post Void Residual) in office  2. Nocturia  - I explained to the patient that nocturia is often multi-factorial and difficult to treat.  Sleeping disorders, heart conditions, peripheral vascular disease, diabetes, an enlarged prostate  for men, an urethral stricture causing bladder outlet obstruction and/or certain medications can contribute to nocturia.  - I have suggested that the patient avoid caffeine after noon and alcohol in the evening.  He or she may also benefit from fluid restrictions after 6:00 in the evening and voiding just prior to bedtime.  - I have explained that research  studies have showed that over 84% of patients with sleep apnea reported frequent nighttime urination.   With sleep apnea, oxygen decreases, carbon dioxide increases, the blood become more acidic, the heart rate drops and blood vessels in the lung constrict.  The body is then alerted that something is very wrong. The sleeper must wake enough to reopen the airway. By this time, the heart is racing and experiences a false signal of fluid overload. The heart excretes a hormone-like protein that tells the body to get rid of sodium and water, resulting in nocturia.  -  I also informed the patient that a recent study noted that decreasing sodium intake to 2.3 grams daily, if they don't have issues with hyponatremia, can also reduce the number of nightly voids  - There is also an increased incidence in sleep apnea with menopause, symptoms include night sweats, daytime sleepiness, depressed mood, and cognitive complaints like poor concentration or problems with short-term memory   - The patient may benefit from a discussion with his or her primary care physician to see if he or she has risk factors for sleep apnea or other sleep disturbances and obtaining a sleep study -she states that she underwent a sleep study a few years ago and did not have sleep apnea  Return for Schedule PTNS: Check with insurance to make sure there is coverage.  These notes generated with voice recognition software. I apologize for typographical errors.  Zara Council, [facility] [address], [place], [place] [address] [phone]

## 2018-06-07 NOTE — Patient Instructions (Signed)
Try taking the Norvasc in the morning Slowly increase your water intake until you are achieving a glasses daily Reduce salt in your diet Try to eliminate or taken very sparing amount of fluids after 6 PM at night

## 2018-06-12 ENCOUNTER — Telehealth: Payer: Self-pay

## 2018-06-12 DIAGNOSIS — M9903 Segmental and somatic dysfunction of lumbar region: Secondary | ICD-10-CM | POA: Diagnosis not present

## 2018-06-12 DIAGNOSIS — M5033 Other cervical disc degeneration, cervicothoracic region: Secondary | ICD-10-CM | POA: Diagnosis not present

## 2018-06-12 DIAGNOSIS — M62830 Muscle spasm of back: Secondary | ICD-10-CM | POA: Diagnosis not present

## 2018-06-12 DIAGNOSIS — M170 Bilateral primary osteoarthritis of knee: Secondary | ICD-10-CM

## 2018-06-12 DIAGNOSIS — M9901 Segmental and somatic dysfunction of cervical region: Secondary | ICD-10-CM | POA: Diagnosis not present

## 2018-06-12 NOTE — Telephone Encounter (Signed)
Yes, please enter referral Document as many problems as she needs (for example, where is her osteoarthritis? Hip? Knees? Hands? Back? Etc) Thank you

## 2018-06-12 NOTE — Telephone Encounter (Signed)
Copied from [place] [id number]. Topic: Referral - Request for Referral >> Jun 12, 2018 11:22 AM Bea Graff, NT wrote: Has patient seen PCP for this complaint? Yes.   *If NO, is insurance requiring patient see PCP for this issue before PCP can refer them? Referral for which specialty: Physical Therapy Preferred provider/office: [facility] Reason for referral: For her osteoarthritis

## 2018-06-13 NOTE — Telephone Encounter (Signed)
Referral entered  

## 2018-06-15 ENCOUNTER — Telehealth: Payer: Self-pay | Admitting: Urology

## 2018-06-15 NOTE — Telephone Encounter (Signed)
Pt left message on machine asking if PTNS was approved for her.  She had appt with Larene Beach and they discussed PTNS.  Please give pt a call.

## 2018-06-19 DIAGNOSIS — M9901 Segmental and somatic dysfunction of cervical region: Secondary | ICD-10-CM | POA: Diagnosis not present

## 2018-06-19 DIAGNOSIS — M62830 Muscle spasm of back: Secondary | ICD-10-CM | POA: Diagnosis not present

## 2018-06-19 DIAGNOSIS — M5033 Other cervical disc degeneration, cervicothoracic region: Secondary | ICD-10-CM | POA: Diagnosis not present

## 2018-06-19 DIAGNOSIS — M9903 Segmental and somatic dysfunction of lumbar region: Secondary | ICD-10-CM | POA: Diagnosis not present

## 2018-06-19 NOTE — Telephone Encounter (Signed)
Do you know if PTNS was approved?

## 2018-07-12 NOTE — Telephone Encounter (Signed)
Prior authorization is not required per Medicare guidelines.  Okay to proceed with scheduling PTNS therapy.

## 2018-07-12 NOTE — Telephone Encounter (Signed)
Spoke with the patient.  She has family coming from out of town to stay with her over the holidays.  She will call back after Christmas to schedule PTNS therapy.

## 2018-07-13 DIAGNOSIS — H2513 Age-related nuclear cataract, bilateral: Secondary | ICD-10-CM | POA: Diagnosis not present

## 2018-07-13 NOTE — Telephone Encounter (Signed)
PA has been done and Lattie Haw spoke with the patient, she will cb after the first of the year to schedule PTNS.   Sharyn Lull

## 2018-07-16 ENCOUNTER — Ambulatory Visit: Payer: Medicare Other | Attending: Family Medicine

## 2018-07-16 DIAGNOSIS — M5033 Other cervical disc degeneration, cervicothoracic region: Secondary | ICD-10-CM | POA: Diagnosis not present

## 2018-07-16 DIAGNOSIS — M25562 Pain in left knee: Secondary | ICD-10-CM | POA: Insufficient documentation

## 2018-07-16 DIAGNOSIS — G8929 Other chronic pain: Secondary | ICD-10-CM | POA: Insufficient documentation

## 2018-07-16 DIAGNOSIS — M6281 Muscle weakness (generalized): Secondary | ICD-10-CM | POA: Diagnosis not present

## 2018-07-16 DIAGNOSIS — M62830 Muscle spasm of back: Secondary | ICD-10-CM | POA: Diagnosis not present

## 2018-07-16 DIAGNOSIS — M25561 Pain in right knee: Secondary | ICD-10-CM | POA: Insufficient documentation

## 2018-07-16 DIAGNOSIS — M9901 Segmental and somatic dysfunction of cervical region: Secondary | ICD-10-CM | POA: Diagnosis not present

## 2018-07-16 DIAGNOSIS — M9903 Segmental and somatic dysfunction of lumbar region: Secondary | ICD-10-CM | POA: Diagnosis not present

## 2018-07-16 NOTE — Patient Instructions (Addendum)
Medbridge Access Code: [email or web address]    Quad set 10x3 with 5 second holds   Side Stepping with Resistance at [employer] band, 30 ft each direction 1-2x daily

## 2018-07-16 NOTE — Therapy (Signed)
[facility] [address] [place], [address] Phone: [phone]   Fax:  [phone]  Physical Therapy Evaluation  Patient Details  Name: Angel [name] MRN: [id number] Date of Birth: [birth year] Referring Provider (PT): Enid Derry, MD   Encounter Date: 07/16/2018  PT End of Session - 07/16/18 1523    Visit Number  1    Number of Visits  13    Date for PT Re-Evaluation  08/30/18    Authorization Type  1    Authorization Time Period  of 10 progress report    PT Start Time  1523    PT Stop Time  1635    PT Time Calculation (min)  72 min    Activity Tolerance  Patient tolerated treatment well    Behavior During Therapy  [facility] for tasks assessed/performed       Past Medical History:  Diagnosis Date  . Anxiety   . Aortic aneurysm without rupture ([place])   . CAD (coronary artery disease)   . Chronic hip pain   . Endometriosis   . Hearing loss   . HTN, goal below 150/90   . Hyperlipidemia LDL goal <70 11/18/2014  . Osteopenia of the elderly   . Urge and stress incontinence     Past Surgical History:  Procedure Laterality Date  . ABDOMINAL HYSTERECTOMY  1976   total  . APPENDECTOMY    . BREAST EXCISIONAL BIOPSY Left 1987   benign    There were no vitals filed for this visit.   Subjective Assessment - 07/16/18 1528    Subjective  R knee: no pain currently, 5/10 at worst for the past 3 months. L knee no pain currently, 4.5/10 at worst for the past 3 months.     Pertinent History  B knee pain since a year or more. Walking up the steps bothers her. Walking around regularly is no pain. Pt lives in a 2 story home, first floor set up. 14 steps to get to second floor, L banister rail starting a third of the way up.  No steps to enter front and back door.  Gradual onset of B knee pain. Has been to Dr. Marry Guan who did x-rays who said that there is still cartilage in both knees. Was diagnosed with osteoarthritis.   [place] Walking up hills bothers her knees.  Pt also walks about 20 minutes a day walking her dog.  R knee feels fragile when she starts with it to step up a step therefore she starts with her L, then she can step up with her R LE. Also goes to a chiropracter for adjustments.  Pt also has 5 step to go down to her meditation room, R rail assist in her home.  Has not yet used ice for her knees.   Pt adds that she has B foot pronation and plantar arch pain in which her orthotics that she wears helps her a lot.     Patient Stated Goals  Greater mobility, walk longer.     Currently in Pain?  No/denies    Pain Score  0-No pain    Pain Location  Knee    Pain Orientation  Right;Left    Pain Type  Chronic pain    Pain Onset  More than a month ago    Pain Frequency  Occasional    Aggravating Factors  first thing in the morning: stiffness which dissipates rapidly. stair negotiation, walking uphills, getting into and out of cars    Pain Relieving Factors  moving around         [facility] PT Assessment - 07/16/18 1542      Assessment   Medical Diagnosis  Primary osteoarthritis of both knees    Referring Provider (PT)  Enid Derry, MD    Onset Date/Surgical Date  06/13/18   Date PT referral signed. Chronic condition.    Prior Therapy  No known PT for current condition .      Precautions   Precaution Comments  Aortic aneurism, osteopenia      Restrictions   Other Position/Activity Restrictions  no known weight bearing restrictions       Balance Screen   Has the patient fallen in the past 6 months  No    Has the patient had a decrease in activity level because of a fear of falling?   No    Is the patient reluctant to leave their home because of a fear of falling?   No      Prior Function   Vocation Requirements  PLOF: better able to negotiate stairs and walk up hills more comfortably for her knees.       Observation/Other Assessments    Observations  stairs: B leg ER, slight decreased femoral control going up and down 4 steps with B UE assist, B medial knee pain.  6 minute walk test: 1045 ft with L lateral hip pain/discomfort.       Posture/Postural Control   Posture Comments  B protracted shoulders and neck, slight R lateral shift,       AROM   Overall AROM Comments  WFL B hip ER and IR in 90/90 position    Right Knee Extension  -15   -10 AAROM with R gastroc discomfort   Right Knee Flexion  123   127 AAROM    Left Knee Extension  -13   -10 degrees AAROM with medial knee discomfort   Left Knee Flexion  116   125 AAROM     Strength   Right Hip Flexion  4/5    Right Hip Extension  4-/5   seated manually resisted hip extension   Right Hip ABduction  4/5   seated manually resisted clamshell   Left Hip Flexion  4/5    Left Hip Extension  4/5   seated manually resisted hip extension   Left Hip ABduction  4/5   seated manually resisted clamshell   Right Knee Flexion  4+/5    Right Knee Extension  5/5    Left Knee Flexion  5/5    Left Knee Extension  5/5      Palpation   Palpation comment  Slight TTP R medial knee. Medial knee swelling R > L. L lateral hamstrings tension > medial hamstrings.       Special Tests   Other special tests  --      Ambulation/Gait   Gait Comments  decreased stance L LE with R pelvic drop, slight decreased foot clearance R > L. B pelvic drop R > L                Objective measurements completed on examination: See above findings.         No latex band allergies Blood pressure is controlled per pt.    (-) calf squeeze bilaterally  B Medial knee pain, with swelling  R > L    Medbridge Access Code: [email or web address]    Aortic aneurism osteopenia  Therapeutic exercise  Supine quad set 10x5 seconds each LE  supine clamshell red band 10x  Then green band 10x  Then blue band 10x. Easy per pt.   Side stepping with green band around thighs 32 ft to the R and 32 ft  to the L  Reviewed HEP. Pt demonstrated and verbalized understanding. Handout provided.   Pt was recommended to put ice on her knees to help with swelling. Pt verbalized understanding.   Improved exercise technique, movement at target joints, use of target muscles after mod verbal, visual, tactile cues.    Patient is an 84 year old female who came to physical therapy secondary to bilateral knee pain. She also presents with altered gait pattern and posture, decreased femoral control, bilateral medial knee swelling, bilateral hip weakness, and difficulty performing functional tasks such as ascending and descending stairs and going up hills due to knee pain. Patient will benefit from skilled physical therapy services to address the aforementioned deficits.     PT Education - 07/16/18 1827    Education Details  ther-ex, HEP, plan of care    Person(s) Educated  Patient    Methods  Explanation;Demonstration;Tactile cues;Verbal cues;Handout    Comprehension  Returned demonstration;Verbalized understanding       PT Short Term Goals - 07/16/18 1653      PT SHORT TERM GOAL #1   Title  Patient will be independent with her HEP to decrease pain, improve strength and function.     Time  3    Period  Weeks    Status  New    Target Date  08/09/18        PT Long Term Goals - 07/16/18 1653      PT LONG TERM GOAL #1   Title  Patient will have a decrease in B knee pain to 2/10 or less at worst to decrease difficulty negotiating stairs or climbing up hills.     Baseline  R knee 5/10 , L knee 4.5/10 at worst for the past 3 months (07/16/2018)    Time  6    Period  Weeks    Status  New    Target Date  08/30/18      PT LONG TERM GOAL #2   Title  Patient will be able to ascend and descend 4 regular steps with bilateral UE assist at least 2 x with minimal to no complain of B knee pain.     Baseline  Increased B knee pain with ascending and descending 4 regular steps with B UE assist (07/16/2018).      Time  6    Period  Weeks    Status  New    Target Date  08/30/18      PT LONG TERM GOAL #3   Title  Patient will improve B hip extension and abduction strengh by at least 1/2 MMT to promote ability to negotiate stairs and climb hills with less pain.     Time  6    Period  Weeks    Status  New    Target Date  08/30/18      PT LONG TERM GOAL #4   Title  Patient will improve her 6 minute walk distance by at least 100 ft to promote mobility.     Baseline  1045 ft with L lateral hip pain/discomfort (07/16/2018)    Time  6  Period  Weeks    Status  New    Target Date  08/30/18             Plan - 07/16/18 1644    Clinical Impression Statement  Patient is an 84 year old female who came to physical therapy secondary to bilateral knee pain. She also presents with altered gait pattern and posture, decreased femoral control, bilateral medial knee swelling, bilateral hip weakness, and difficulty performing functional tasks such as ascending and descending stairs and going up hills due to knee pain. Patient will benefit from skilled physical therapy services to address the aforementioned deficits.     History and Personal Factors relevant to plan of care:  B knee pain, hip weakness, decreased femoral control, aortic aneurism, B knee pain with stair negotiation and going up and down stairs.     Clinical Presentation  Stable    Clinical Presentation due to:  Pain does not seem to have improved    Clinical Decision Making  Low    Rehab Potential  Fair    Clinical Impairments Affecting Rehab Potential  (-) age, chronicity of knee pain, hip weakenss. (+) motivated    PT Frequency  2x / week    PT Duration  6 weeks    PT Treatment/Interventions  Aquatic Therapy;Electrical Stimulation;Iontophoresis 4mg /ml Dexamethasone;Gait training;Stair training;Therapeutic activities;Therapeutic exercise;Balance training;Neuromuscular re-education;Patient/family education;Manual techniques;Dry needling    PT  Next Visit Plan  hip strengthening, femoral control, manual techniques, modalities PRN    Consulted and Agree with Plan of Care  Patient       Patient will benefit from skilled therapeutic intervention in order to improve the following deficits and impairments:  Pain, Postural dysfunction, Improper body mechanics, Decreased strength, Decreased range of motion  Visit Diagnosis: Chronic pain of right knee - Plan: PT plan of care cert/re-cert  Chronic pain of left knee - Plan: PT plan of care cert/re-cert  Muscle weakness (generalized) - Plan: PT plan of care cert/re-cert     Problem List Patient Active Problem List   Diagnosis Date Noted  . Arthritis of knee, degenerative 05/22/2018  . Cataract, right eye 05/17/2018  . Aortic stenosis, mild 02/22/2018  . Obesity (BMI 30.0-34.9) 01/18/2018  . Medication monitoring encounter 12/30/2015  . Productive cough 11/02/2015  . Skin lesion 08/04/2015  . 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 Hip pain, chronic 04/02/2015  . Hearing loss of both ears 04/02/2015  . Osteopenia of the elderly 04/02/2015  . Hormone replacement therapy (postmenopausal) 04/02/2015  . Aortic aneurysm without rupture ([place]) 04/02/2015  . Situational anxiety 04/02/2015  . Dermatitis 04/02/2015  . Hyperlipidemia LDL goal <70 11/18/2014  . Incomplete bladder emptying 06/29/2014  . Mixed stress and urge urinary incontinence 06/29/2014  . Urge incontinence of urine 06/29/2014  . Hypertension goal BP (blood pressure) < 140/90 11/06/2013  . CAD (coronary artery disease) 11/06/2013    Joneen Boers PT, DPT   07/16/2018, 6:37 PM  [facility] [address], [place], [address] Phone: [phone]   Fax:  [phone]  Name: Angel [name] MRN: [id number] Date of Birth: 06/18/1931

## 2018-07-18 ENCOUNTER — Ambulatory Visit: Payer: Medicare Other

## 2018-07-23 ENCOUNTER — Ambulatory Visit: Payer: Medicare Other

## 2018-07-23 DIAGNOSIS — G8929 Other chronic pain: Secondary | ICD-10-CM

## 2018-07-23 DIAGNOSIS — M25562 Pain in left knee: Secondary | ICD-10-CM

## 2018-07-23 DIAGNOSIS — M25561 Pain in right knee: Secondary | ICD-10-CM | POA: Diagnosis not present

## 2018-07-23 DIAGNOSIS — M6281 Muscle weakness (generalized): Secondary | ICD-10-CM | POA: Diagnosis not present

## 2018-07-23 NOTE — Therapy (Signed)
[facility] [address], [place], [address] Phone: [phone]   Fax:  [phone]  Physical Therapy Treatment  Patient Details  Name: Angel [name] MRN: [id number] Date of Birth: Dec 14, 1930 Referring Provider (PT): Enid Derry, MD   Encounter Date: 07/23/2018  PT End of Session - 07/23/18 1033    Visit Number  2    Number of Visits  13    Date for PT Re-Evaluation  08/30/18    Authorization Type  2    Authorization Time Period  of 10 progress report    PT Start Time  1033    PT Stop Time  1115    PT Time Calculation (min)  42 min    Activity Tolerance  Patient tolerated treatment well    Behavior During Therapy  [facility] [place] [facility] for tasks assessed/performed       Past Medical History:  Diagnosis Date  . Anxiety   . Aortic aneurysm without rupture ([place])   . CAD (coronary artery disease)   . Chronic hip pain   . Endometriosis   . Hearing loss   . HTN, goal below 150/90   . Hyperlipidemia LDL goal <70 11/18/2014  . Osteopenia of the elderly   . Urge and stress incontinence     Past Surgical History:  Procedure Laterality Date  . ABDOMINAL HYSTERECTOMY  1976   total  . APPENDECTOMY    . BREAST EXCISIONAL BIOPSY Left 1987   benign    There were no vitals filed for this visit.  Subjective Assessment - 07/23/18 1035    Subjective   Both knees are a little cranky which is generally true during the mornings until about noon.  3/10 R knee, and L knee when walking.     Pertinent History  B knee pain since a year or more. Walking up the steps bothers her. Walking around regularly is no pain. Pt lives in a 2 story home, first floor set up. 14 steps to get to second floor, L banister rail starting a third of the way up.  No steps to enter front and back door.  Gradual onset of B knee pain. Has been to Dr. Marry Guan who did x-rays who said that there is still cartilage in both knees. Was diagnosed with  osteoarthritis.  [place]. Walking up hills bothers her knees.  Pt also walks about 20 minutes a day walking her dog.  R knee feels fragile when she starts with it to step up a step therefore she starts with her L, then she can step up with her R LE. Also goes to a chiropracter for adjustments.  Pt also has 5 step to go down to her meditation room, R rail assist in her home.  Has not yet used ice for her knees.   Pt adds that she has B foot pronation and plantar arch pain in which her orthotics that she wears helps her a lot.     Patient Stated Goals  Greater mobility, walk longer.     Currently in Pain?  Yes    Pain Score  3     Pain Onset  More than a month ago                               PT Education - 07/23/18 1112  Education Details  ther-ex    Person(s) Educated  Patient    Methods  Explanation;Demonstration;Tactile cues;Verbal cues    Comprehension  Verbalized understanding;Returned demonstration       Objective   No latex band allergies Blood pressure is controlled per pt.    B Medial knee pain, with swelling R > L    Medbridge Access Code: [email or web address]    Aortic aneurism osteopenia  Therapeutic exercise  Standing ankle DF/PF on rocker board 2 minutes with B UE assist    Standing hip abduction resisting red band around ankles with B UE assist 10x3 each LE  Standing LE leg press resisting double blue band with B UE assist 10x3 each LE   Forward step up onto Air Ex Pad 10x2 each LE with emphasis on femoral control   Tendency for tibial ER when stepping back off pad R > L  Seated manually resisted knee flexion targeting medial hamstrings   R 10x2  L 10x2  Pt states that the exercise felt helpful   HEP with band next visit if appropriate  Improved exercise technique, movement at target joints, use of target muscles after mod verbal, visual, tactile cues.    Manual  therapy   Seated STM R lateral hamstrings Seated STM L lateral hamstrings   Worked on femoral control, glute strengthening, decreasing lateral hamstrings tension and improving medial hamstrings use to help promote more neutral femoral and tibial positioning. Pt states knees feel better after session. Pt will benefit from continued skilled physical therapy services to decrease pain, improve strength, and function.     PT Short Term Goals - 07/16/18 1653      PT SHORT TERM GOAL #1   Title  Patient will be independent with her HEP to decrease pain, improve strength and function.     Time  3    Period  Weeks    Status  New    Target Date  08/09/18        PT Long Term Goals - 07/16/18 1653      PT LONG TERM GOAL #1   Title  Patient will have a decrease in B knee pain to 2/10 or less at worst to decrease difficulty negotiating stairs or climbing up hills.     Baseline  R knee 5/10 , L knee 4.5/10 at worst for the past 3 months (07/16/2018)    Time  6    Period  Weeks    Status  New    Target Date  08/30/18      PT LONG TERM GOAL #2   Title  Patient will be able to ascend and descend 4 regular steps with bilateral UE assist at least 2 x with minimal to no complain of B knee pain.     Baseline  Increased B knee pain with ascending and descending 4 regular steps with B UE assist (07/16/2018).     Time  6    Period  Weeks    Status  New    Target Date  08/30/18      PT LONG TERM GOAL #3   Title  Patient will improve B hip extension and abduction strengh by at least 1/2 MMT to promote ability to negotiate stairs and climb hills with less pain.     Time  6    Period  Weeks    Status  New    Target Date  08/30/18      PT LONG TERM GOAL #4  Title  Patient will improve her 6 minute walk distance by at least 100 ft to promote mobility.     Baseline  1045 ft with L lateral hip pain/discomfort (07/16/2018)    Time  6    Period  Weeks    Status  New    Target Date  08/30/18             Plan - 07/23/18 1120    Clinical Impression Statement  Worked on femoral control, glute strengthening, decreasing lateral hamstrings tension and improving medial hamstrings use to help promote more neutral femoral and tibial positioning. Pt states knees feel better after session. Pt will benefit from continued skilled physical therapy services to decrease pain, improve strength, and function.     Rehab Potential  Fair    Clinical Impairments Affecting Rehab Potential  (-) age, chronicity of knee pain, hip weakenss. (+) motivated    PT Frequency  2x / week    PT Duration  6 weeks    PT Treatment/Interventions  Aquatic Therapy;Electrical Stimulation;Iontophoresis 4mg /ml Dexamethasone;Gait training;Stair training;Therapeutic activities;Therapeutic exercise;Balance training;Neuromuscular re-education;Patient/family education;Manual techniques;Dry needling    PT Next Visit Plan  hip strengthening, femoral control, manual techniques, modalities PRN    Consulted and Agree with Plan of Care  Patient       Patient will benefit from skilled therapeutic intervention in order to improve the following deficits and impairments:  Pain, Postural dysfunction, Improper body mechanics, Decreased strength, Decreased range of motion  Visit Diagnosis: Chronic pain of right knee  Chronic pain of left knee  Muscle weakness (generalized)     Problem List Patient Active Problem List   Diagnosis Date Noted  . Arthritis of knee, degenerative 05/22/2018  . Cataract, right eye 05/17/2018  . Aortic stenosis, mild 02/22/2018  . Obesity (BMI 30.0-34.9) 01/18/2018  . Medication monitoring encounter 12/30/2015  . Productive cough 11/02/2015  . Skin lesion 08/04/2015  . 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 Hip pain, chronic 04/02/2015  . Hearing loss of both ears 04/02/2015  . Osteopenia of the elderly 04/02/2015  . Hormone replacement therapy (postmenopausal) 04/02/2015  . Aortic aneurysm without rupture  ([place]) 04/02/2015  . Situational anxiety 04/02/2015  . Dermatitis 04/02/2015  . Hyperlipidemia LDL goal <70 11/18/2014  . Incomplete bladder emptying 06/29/2014  . Mixed stress and urge urinary incontinence 06/29/2014  . Urge incontinence of urine 06/29/2014  . Hypertension goal BP (blood pressure) < 140/90 11/06/2013  . CAD (coronary artery disease) 11/06/2013    Joneen Boers PT, DPT   07/23/2018, 11:26 AM  [facility] [address] [place], [address] Phone: [phone]   Fax:  [phone]  Name: Angel [name] MRN: [id number] Date of Birth: 19-Jan-1931

## 2018-07-25 ENCOUNTER — Ambulatory Visit: Payer: Medicare Other

## 2018-07-25 DIAGNOSIS — M6281 Muscle weakness (generalized): Secondary | ICD-10-CM | POA: Diagnosis not present

## 2018-07-25 DIAGNOSIS — M25562 Pain in left knee: Secondary | ICD-10-CM

## 2018-07-25 DIAGNOSIS — G8929 Other chronic pain: Secondary | ICD-10-CM | POA: Diagnosis not present

## 2018-07-25 DIAGNOSIS — M25561 Pain in right knee: Principal | ICD-10-CM

## 2018-07-25 NOTE — Therapy (Signed)
[facility] [address], [place], [address] Phone: [phone]   Fax:  [phone]  Physical Therapy Treatment  Patient Details  Name: Angel [name] MRN: [id number] Date of Birth: March 15, 1931 Referring Provider (PT): Enid Derry, MD   Encounter Date: 07/25/2018  PT End of Session - 07/25/18 1033    Visit Number  3    Number of Visits  13    Date for PT Re-Evaluation  08/30/18    Authorization Type  3    Authorization Time Period  of 10 progress report    PT Start Time  1034    PT Stop Time  1115    PT Time Calculation (min)  41 min    Activity Tolerance  Patient tolerated treatment well    Behavior During Therapy  [facility] for tasks assessed/performed       Past Medical History:  Diagnosis Date  . Anxiety   . Aortic aneurysm without rupture ([place])   . CAD (coronary artery disease)   . Chronic hip pain   . Endometriosis   . Hearing loss   . HTN, goal below 150/90   . Hyperlipidemia LDL goal <70 11/18/2014  . Osteopenia of the elderly   . Urge and stress incontinence     Past Surgical History:  Procedure Laterality Date  . ABDOMINAL HYSTERECTOMY  1976   total  . APPENDECTOMY    . BREAST EXCISIONAL BIOPSY Left 1987   benign    There were no vitals filed for this visit.  Subjective Assessment - 07/25/18 1035    Subjective  Pt states that she is doing better. 3.5/10 R and L knee pain when walking.     Pertinent History  B knee pain since a year or more. Walking up the steps bothers her. Walking around regularly is no pain. Pt lives in a 2 story home, first floor set up. 14 steps to get to second floor, L banister rail starting a third of the way up.  No steps to enter front and back door.  Gradual onset of B knee pain. Has been to Dr. Marry Guan who did x-rays who said that there is still cartilage in both knees. Was diagnosed with osteoarthritis.  [place]. Walking up hills bothers her knees.  Pt also walks about 20 minutes a day walking her dog.  R knee feels fragile when she starts with it to step up a step therefore she starts with her L, then she can step up with her R LE. Also goes to a chiropracter for adjustments.  Pt also has 5 step to go down to her meditation room, R rail assist in her home.  Has not yet used ice for her knees.   Pt adds that she has B foot pronation and plantar arch pain in which her orthotics that she wears helps her a lot.     Patient Stated Goals  Greater mobility, walk longer.     Currently in Pain?  Yes    Pain Score  3    3.5/10   Pain Onset  More than a month ago                               PT Education - 07/25/18 1107    Education Details  ther-ex, HEP  Person(s) Educated  Patient    Methods  Explanation;Demonstration;Tactile cues;Verbal cues;Handout    Comprehension  Verbalized understanding;Returned demonstration        Objective  No latex band allergies Blood pressure is controlled per pt.   B Medial knee pain, with swelling R > L   MedbridgeAccess Code: [email or web address]   Aortic aneurism osteopenia   Manual therapy  Seated STM R lateral hamstrings Seated STM L lateral hamstrings    Therapeutic exercise  Seated knee flexion targeting the medial hamstrings  R 10x2 green band; 10x red band  L 10x2 green band, 10x red band  No B medial knee pain with gait afterwards  Reviewed and given as part of her HEP. Pt demonstrated and verbalized understanding. Handout provided.    Standing ankle DF/PF on rocker board 2 minutes with B UE assist                Standing LE leg press resisting double blue band with B UE assist 10x3 each LE   Standing hip abduction resisting red band around ankles with B UE assist 10x2 each LE  Running man with one UE assist  R 10x2  L 10x2  Improved exercise technique, movement at target joints, use of  target muscles after mod verbal, visual, tactile cues.    Decreased B medial knee pain with treatment to decrease lateral hamstrings tension and improve medial hamstrings activation. Continued working on glute med and max strengthening to promote better control of thigh and improve mechanics at the knee. Pt will benefit from continued skilled physical therapy services to decrease pain, improve strength and function.        PT Short Term Goals - 07/16/18 1653      PT SHORT TERM GOAL #1   Title  Patient will be independent with her HEP to decrease pain, improve strength and function.     Time  3    Period  Weeks    Status  New    Target Date  08/09/18        PT Long Term Goals - 07/16/18 1653      PT LONG TERM GOAL #1   Title  Patient will have a decrease in B knee pain to 2/10 or less at worst to decrease difficulty negotiating stairs or climbing up hills.     Baseline  R knee 5/10 , L knee 4.5/10 at worst for the past 3 months (07/16/2018)    Time  6    Period  Weeks    Status  New    Target Date  08/30/18      PT LONG TERM GOAL #2   Title  Patient will be able to ascend and descend 4 regular steps with bilateral UE assist at least 2 x with minimal to no complain of B knee pain.     Baseline  Increased B knee pain with ascending and descending 4 regular steps with B UE assist (07/16/2018).     Time  6    Period  Weeks    Status  New    Target Date  08/30/18      PT LONG TERM GOAL #3   Title  Patient will improve B hip extension and abduction strengh by at least 1/2 MMT to promote ability to negotiate stairs and climb hills with less pain.     Time  6    Period  Weeks    Status  New    Target Date  08/30/18      PT LONG TERM GOAL #4   Title  Patient will improve her 6 minute walk distance by at least 100 ft to promote mobility.     Baseline  1045 ft with L lateral hip pain/discomfort (07/16/2018)    Time  6    Period  Weeks    Status  New    Target Date  08/30/18             Plan - 07/25/18 1033    Clinical Impression Statement  Decreased B medial knee pain with treatment to decrease lateral hamstrings tension and improve medial hamstrings activation. Continued working on glute med and max strengthening to promote better control of thigh and improve mechanics at the knee. Pt will benefit from continued skilled physical therapy services to decrease pain, improve strength and function.     Rehab Potential  Fair    Clinical Impairments Affecting Rehab Potential  (-) age, chronicity of knee pain, hip weakenss. (+) motivated    PT Frequency  2x / week    PT Duration  6 weeks    PT Treatment/Interventions  Aquatic Therapy;Electrical Stimulation;Iontophoresis 4mg /ml Dexamethasone;Gait training;Stair training;Therapeutic activities;Therapeutic exercise;Balance training;Neuromuscular re-education;Patient/family education;Manual techniques;Dry needling    PT Next Visit Plan  hip strengthening, femoral control, manual techniques, modalities PRN    Consulted and Agree with Plan of Care  Patient       Patient will benefit from skilled therapeutic intervention in order to improve the following deficits and impairments:  Pain, Postural dysfunction, Improper body mechanics, Decreased strength, Decreased range of motion  Visit Diagnosis: Chronic pain of right knee  Chronic pain of left knee  Muscle weakness (generalized)     Problem List Patient Active Problem List   Diagnosis Date Noted  . Arthritis of knee, degenerative 05/22/2018  . Cataract, right eye 05/17/2018  . Aortic stenosis, mild 02/22/2018  . Obesity (BMI 30.0-34.9) 01/18/2018  . Medication monitoring encounter 12/30/2015  . Productive cough 11/02/2015  . Skin lesion 08/04/2015  . 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 Hip pain, chronic 04/02/2015  . Hearing loss of both ears 04/02/2015  . Osteopenia of the elderly 04/02/2015  . Hormone replacement therapy (postmenopausal) 04/02/2015  . Aortic  aneurysm without rupture ([place]) 04/02/2015  . Situational anxiety 04/02/2015  . Dermatitis 04/02/2015  . Hyperlipidemia LDL goal <70 11/18/2014  . Incomplete bladder emptying 06/29/2014  . Mixed stress and urge urinary incontinence 06/29/2014  . Urge incontinence of urine 06/29/2014  . Hypertension goal BP (blood pressure) < 140/90 11/06/2013  . CAD (coronary artery disease) 11/06/2013    Joneen Boers PT, DPT   07/25/2018, 7:01 PM  [facility] [address] [place], [address] Phone: [phone]   Fax:  [phone]  Name: Angel [name] MRN: [id number] Date of Birth: 01-Apr-1931

## 2018-07-25 NOTE — Patient Instructions (Signed)
Gave seated knee flexion targeting medial hamstrings, green band 10x2-3 sets per day each LE as part of her HEP.   Pt demonstrated and verbalized understanding. Handout provided.

## 2018-07-30 ENCOUNTER — Ambulatory Visit: Payer: Medicare Other

## 2018-08-02 ENCOUNTER — Ambulatory Visit: Payer: Medicare Other

## 2018-08-07 ENCOUNTER — Other Ambulatory Visit: Payer: Self-pay | Admitting: Family Medicine

## 2018-08-07 NOTE — Telephone Encounter (Signed)
Please ask the patient if she is willing to decrease the premarin (estrogen) to just two days a week We want to remind her of the risks of estrogen use and the need to try to taper

## 2018-08-07 NOTE — Telephone Encounter (Signed)
Thank you. Rx sent.

## 2018-08-07 NOTE — Telephone Encounter (Signed)
Pt states she is willing to taper down to 2 times a week.

## 2018-08-08 HISTORY — PX: CATARACT EXTRACTION, BILATERAL: SHX1313

## 2018-08-09 ENCOUNTER — Ambulatory Visit: Payer: Medicare Other | Attending: Family Medicine

## 2018-08-09 DIAGNOSIS — M6281 Muscle weakness (generalized): Secondary | ICD-10-CM | POA: Insufficient documentation

## 2018-08-09 DIAGNOSIS — G8929 Other chronic pain: Secondary | ICD-10-CM | POA: Diagnosis not present

## 2018-08-09 DIAGNOSIS — M25561 Pain in right knee: Secondary | ICD-10-CM | POA: Diagnosis not present

## 2018-08-09 DIAGNOSIS — M25562 Pain in left knee: Secondary | ICD-10-CM | POA: Insufficient documentation

## 2018-08-09 NOTE — Patient Instructions (Signed)
Running man with one UE assist             R 10x3             L 10x3   Reviewed and given as part of her HEP.

## 2018-08-09 NOTE — Therapy (Signed)
[facility] [address] [place], [address] Phone: [phone]   Fax:  [phone]  Physical Therapy Treatment  Patient Details  Name: Angel [name] MRN: [id number] Date of Birth: 01-06-1931 Referring Provider (PT): Enid Derry, MD   Encounter Date: 08/09/2018  PT End of Session - 08/09/18 1120    Visit Number  4    Number of Visits  13    Date for PT Re-Evaluation  08/30/18    Authorization Type  4    Authorization Time Period  of 10 progress report    PT Start Time  1120    PT Stop Time  1204    PT Time Calculation (min)  44 min    Activity Tolerance  Patient tolerated treatment well    Behavior During Therapy  [facility] for tasks assessed/performed       Past Medical History:  Diagnosis Date  . Anxiety   . Aortic aneurysm without rupture ([place])   . CAD (coronary artery disease)   . Chronic hip pain   . Endometriosis   . Hearing loss   . HTN, goal below 150/90   . Hyperlipidemia LDL goal <70 11/18/2014  . Osteopenia of the elderly   . Urge and stress incontinence     Past Surgical History:  Procedure Laterality Date  . ABDOMINAL HYSTERECTOMY  1976   total  . APPENDECTOMY    . BREAST EXCISIONAL BIOPSY Left 1987   benign    There were no vitals filed for this visit.  Subjective Assessment - 08/09/18 1121    Subjective  The steps have been giving her knees a problem. Has been unable to do her HEP due to being busy hosting family members over the holidays. 4/10 B knees when walking. Not so bad.     Pertinent History  B knee pain since a year or more. Walking up the steps bothers her. Walking around regularly is no pain. Pt lives in a 2 story home, first floor set up. 14 steps to get to second floor, L banister rail starting a third of the way up.  No steps to enter front and back door.  Gradual onset of B knee pain. Has been to Dr. Marry Guan who did x-rays who said that there is still cartilage in  both knees. Was diagnosed with osteoarthritis.  [place] Walking up hills bothers her knees.  Pt also walks about 20 minutes a day walking her dog.  R knee feels fragile when she starts with it to step up a step therefore she starts with her L, then she can step up with her R LE. Also goes to a chiropracter for adjustments.  Pt also has 5 step to go down to her meditation room, R rail assist in her home.  Has not yet used ice for her knees.   Pt adds that she has B foot pronation and plantar arch pain in which her orthotics that she wears helps her a lot.     Patient Stated Goals  Greater mobility, walk longer.     Currently in Pain?  Yes    Pain Score  4    B knees when walking   Pain Onset  More than a month ago  PT Education - 08/09/18 1129    Education Details  ther-ex    Person(s) Educated  Patient    Methods  Explanation;Demonstration;Tactile cues;Verbal cues    Comprehension  Returned demonstration;Verbalized understanding      Objective  No latex band allergies Blood pressure is controlled per pt.   B Medial knee pain, with swelling R > L   MedbridgeAccess Code: [email or web address]   Aortic aneurism osteopenia     Therapeutic exercise  Seated knee flexion targeting the medial hamstrings             R 10x3 green band             L 10x3 green band  Seated clamshells, hips less than 90 degrees flexion to promote glute med muscle strengthening  Blue band 10x, then 10x5 seconds for 3 sets.   Seated hip adduction ball and glute max squeeze 10x5 seconds for 3 sets  Running man with one UE assist             R 10x3             L 10x3   Reviewed and given as part of her HEP.   Sit <> stand from regular chair with arms with B UE assist   4x with emphasis on femoral control  B knee discomfort  Seated LAQ 2 lbs 5x3 each LE. Pain free range.    More difficult for R LE  Standing ankle DF/PF on rocker board 2 minutes with B UE assist   Standing TKE R LE to promote end range quadriceps muscle use. 9x5 seconds. R knee joint discomfort.   Standing LE leg press resisting double blue band with B UE assist 10x3 each LE     Improved exercise technique, movement at target joints, use of target muscles after min to mod verbal, visual, tactile cues.   Continued working on medial hamstrings, quadriceps, and glute med and max strengthening to promote better knee joint position when walking or negotiating stairs to help decrease pain.  Pt will benefit from continued skilled physical therapy services to improve strength and function.      PT Short Term Goals - 07/16/18 1653      PT SHORT TERM GOAL #1   Title  Patient will be independent with her HEP to decrease pain, improve strength and function.     Time  3    Period  Weeks    Status  New    Target Date  08/09/18        PT Long Term Goals - 07/16/18 1653      PT LONG TERM GOAL #1   Title  Patient will have a decrease in B knee pain to 2/10 or less at worst to decrease difficulty negotiating stairs or climbing up hills.     Baseline  R knee 5/10 , L knee 4.5/10 at worst for the past 3 months (07/16/2018)    Time  6    Period  Weeks    Status  New    Target Date  08/30/18      PT LONG TERM GOAL #2   Title  Patient will be able to ascend and descend 4 regular steps with bilateral UE assist at least 2 x with minimal to no complain of B knee pain.     Baseline  Increased B knee pain with ascending and descending 4 regular steps with B UE assist (07/16/2018).     Time  6  Period  Weeks    Status  New    Target Date  08/30/18      PT LONG TERM GOAL #3   Title  Patient will improve B hip extension and abduction strengh by at least 1/2 MMT to promote ability to negotiate stairs and climb hills with less pain.     Time  6    Period  Weeks    Status  New    Target Date   08/30/18      PT LONG TERM GOAL #4   Title  Patient will improve her 6 minute walk distance by at least 100 ft to promote mobility.     Baseline  1045 ft with L lateral hip pain/discomfort (07/16/2018)    Time  6    Period  Weeks    Status  New    Target Date  08/30/18            Plan - 08/09/18 1130    Clinical Impression Statement  Continued working on medial hamstrings, quadriceps, and glute med and max strengthening to promote better knee joint position when walking or negotiating stairs to help decrease pain.  Pt will benefit from continued skilled physical therapy services to improve strength and function.     Rehab Potential  Fair    Clinical Impairments Affecting Rehab Potential  (-) age, chronicity of knee pain, hip weakenss. (+) motivated    PT Frequency  2x / week    PT Duration  6 weeks    PT Treatment/Interventions  Aquatic Therapy;Electrical Stimulation;Iontophoresis 4mg /ml Dexamethasone;Gait training;Stair training;Therapeutic activities;Therapeutic exercise;Balance training;Neuromuscular re-education;Patient/family education;Manual techniques;Dry needling    PT Next Visit Plan  hip strengthening, femoral control, manual techniques, modalities PRN    Consulted and Agree with Plan of Care  Patient       Patient will benefit from skilled therapeutic intervention in order to improve the following deficits and impairments:  Pain, Postural dysfunction, Improper body mechanics, Decreased strength, Decreased range of motion  Visit Diagnosis: Chronic pain of right knee  Chronic pain of left knee  Muscle weakness (generalized)     Problem List Patient Active Problem List   Diagnosis Date Noted  . Arthritis of knee, degenerative 05/22/2018  . Cataract, right eye 05/17/2018  . Aortic stenosis, mild 02/22/2018  . Obesity (BMI 30.0-34.9) 01/18/2018  . Medication monitoring encounter 12/30/2015  . Productive cough 11/02/2015  . Skin lesion 08/04/2015  . Crossover  toe 08/04/2015  . Hip pain, chronic 04/02/2015  . Hearing loss of both ears 04/02/2015  . Osteopenia of the elderly 04/02/2015  . Hormone replacement therapy (postmenopausal) 04/02/2015  . Aortic aneurysm without rupture ([place]) 04/02/2015  . Situational anxiety 04/02/2015  . Dermatitis 04/02/2015  . Hyperlipidemia LDL goal <70 11/18/2014  . Incomplete bladder emptying 06/29/2014  . Mixed stress and urge urinary incontinence 06/29/2014  . Urge incontinence of urine 06/29/2014  . Hypertension goal BP (blood pressure) < 140/90 11/06/2013  . CAD (coronary artery disease) 11/06/2013    Joneen Boers PT, DPT    08/09/2018, 4:24 PM  [facility] [address] [place], [address] Phone: [phone]   Fax:  [phone]  Name: [place] DOST MRN: [id number] Date of Birth: 11-25-1930

## 2018-08-13 ENCOUNTER — Ambulatory Visit: Payer: Medicare Other

## 2018-08-20 ENCOUNTER — Ambulatory Visit: Payer: Medicare Other

## 2018-08-20 DIAGNOSIS — M25562 Pain in left knee: Secondary | ICD-10-CM | POA: Diagnosis not present

## 2018-08-20 DIAGNOSIS — G8929 Other chronic pain: Secondary | ICD-10-CM | POA: Diagnosis not present

## 2018-08-20 DIAGNOSIS — M25561 Pain in right knee: Principal | ICD-10-CM

## 2018-08-20 DIAGNOSIS — M6281 Muscle weakness (generalized): Secondary | ICD-10-CM | POA: Diagnosis not present

## 2018-08-20 NOTE — Therapy (Signed)
[facility] [address] [place], [address] Phone: [phone]   Fax:  [phone]  Physical Therapy Treatment  Patient Details  Name: Angel [name] MRN: [id number] Date of Birth: 1931/01/27 Referring Provider (PT): Enid Derry, MD   Encounter Date: 08/20/2018  PT End of Session - 08/20/18 1520    Visit Number  5    Number of Visits  13    Date for PT Re-Evaluation  08/30/18    Authorization Type  5    Authorization Time Period  of 10 progress report    PT Start Time  1521    PT Stop Time  1603    PT Time Calculation (min)  42 min    Activity Tolerance  Patient tolerated treatment well    Behavior During Therapy  [facility] for tasks assessed/performed       Past Medical History:  Diagnosis Date  . Anxiety   . Aortic aneurysm without rupture ([place])   . CAD (coronary artery disease)   . Chronic hip pain   . Endometriosis   . Hearing loss   . HTN, goal below 150/90   . Hyperlipidemia LDL goal <70 11/18/2014  . Osteopenia of the elderly   . Urge and stress incontinence     Past Surgical History:  Procedure Laterality Date  . ABDOMINAL HYSTERECTOMY  1976   total  . APPENDECTOMY    . BREAST EXCISIONAL BIOPSY Left 1987   benign    There were no vitals filed for this visit.  Subjective Assessment - 08/20/18 1521    Subjective  Knees are not well. The rain seems to make a difference somehow. Ordinary walking is not a big problem. Going up stairs bothers her knees.   Both knees feel stiff currently. Not in pain currently.   4-5/10 B knee pain at most for the past 7 days when she steps up.     Pertinent History  B knee pain since a year or more. Walking up the steps bothers her. Walking around regularly is no pain. Pt lives in a 2 story home, first floor set up. 14 steps to get to second floor, L banister rail starting a third of the way up.  No steps to enter front and back door.  Gradual onset of B knee  pain. Has been to Dr. Marry Guan who did x-rays who said that there is still cartilage in both knees. Was diagnosed with osteoarthritis.  [place] Walking up hills bothers her knees.  Pt also walks about 20 minutes a day walking her dog.  R knee feels fragile when she starts with it to step up a step therefore she starts with her L, then she can step up with her R LE. Also goes to a chiropracter for adjustments.  Pt also has 5 step to go down to her meditation room, R rail assist in her home.  Has not yet used ice for her knees.   Pt adds that she has B foot pronation and plantar arch pain in which her orthotics that she wears helps her a lot.     Patient Stated Goals  Greater mobility, walk longer.     Currently in Pain?  No/denies    Pain Score  0-No pain    Pain Onset  More than a month ago  PT Education - 08/20/18 1528    Education Details  ther-ex    Person(s) Educated  Patient    Methods  Explanation;Demonstration;Tactile cues;Verbal cues    Comprehension  Returned demonstration;Verbalized understanding      Objective  No latex band allergies Blood pressure is controlled per pt.   B Medial knee pain, with swelling R > L   MedbridgeAccess Code: [email or web address]   Aortic aneurism osteopenia   Therapeutic exercise   Running man with one UE assist R 10x3 L 10x3  Stairs with B UE assist (up and down 4 regular steps)  2x. B medial knee pain L > R  Forward step up onto 4 inch step with one UE assist   R LE 10x2  L LE 10x2   Decreased knee discomfort with addition of glute max squeeze  Forward step up onto 1st regular step with B UE assist and glute max squeeze. 5x each LE   Standing LE leg press resisting double blue band with B UE assist 10x3 each LE      Improved exercise technique, movement at target joints, use of target  muscles after min to mod verbal, visual, tactile cues.    Manual therapy  Seated STM lateral hamstrings B Seated STM L vastus lateralis  No change in B knee pain with stair negotiation   Decreased B medial knee pain with with slight ER of B tibia as well as with activation of glute max muscles to promote hip abduction/ER to help decrease medial knee pressure when stepping up onto a 4 to 6 inch step. Pt was recommended to activate her glute max muscles when going up and down hills as well as with stair negotiation. Pt tolerated session well without aggravation of symptoms. Pt will benefit from continued skilled physical therapy services to decrease knee pain, improve LE strength and function.       PT Short Term Goals - 07/16/18 1653      PT SHORT TERM GOAL #1   Title  Patient will be independent with her HEP to decrease pain, improve strength and function.     Time  3    Period  Weeks    Status  New    Target Date  08/09/18        PT Long Term Goals - 07/16/18 1653      PT LONG TERM GOAL #1   Title  Patient will have a decrease in B knee pain to 2/10 or less at worst to decrease difficulty negotiating stairs or climbing up hills.     Baseline  R knee 5/10 , L knee 4.5/10 at worst for the past 3 months (07/16/2018)    Time  6    Period  Weeks    Status  New    Target Date  08/30/18      PT LONG TERM GOAL #2   Title  Patient will be able to ascend and descend 4 regular steps with bilateral UE assist at least 2 x with minimal to no complain of B knee pain.     Baseline  Increased B knee pain with ascending and descending 4 regular steps with B UE assist (07/16/2018).     Time  6    Period  Weeks    Status  New    Target Date  08/30/18      PT LONG TERM GOAL #3   Title  Patient will improve B hip extension and abduction strengh by at least  1/2 MMT to promote ability to negotiate stairs and climb hills with less pain.     Time  6    Period  Weeks    Status  New    Target  Date  08/30/18      PT LONG TERM GOAL #4   Title  Patient will improve her 6 minute walk distance by at least 100 ft to promote mobility.     Baseline  1045 ft with L lateral hip pain/discomfort (07/16/2018)    Time  6    Period  Weeks    Status  New    Target Date  08/30/18            Plan - 08/20/18 1528    Clinical Impression Statement  Decreased B medial knee pain with with slight ER of B tibia as well as with activation of glute max muscles to promote hip abduction/ER to help decrease medial knee pressure when stepping up onto a 4 to 6 inch step. Pt was recommended to activate her glute max muscles when going up and down hills as well as with stair negotiation. Pt tolerated session well without aggravation of symptoms. Pt will benefit from continued skilled physical therapy services to decrease knee pain, improve LE strength and function.      Rehab Potential  Fair    Clinical Impairments Affecting Rehab Potential  (-) age, chronicity of knee pain, hip weakenss. (+) motivated    PT Frequency  2x / week    PT Duration  6 weeks    PT Treatment/Interventions  Aquatic Therapy;Electrical Stimulation;Iontophoresis 4mg /ml Dexamethasone;Gait training;Stair training;Therapeutic activities;Therapeutic exercise;Balance training;Neuromuscular re-education;Patient/family education;Manual techniques;Dry needling    PT Next Visit Plan  hip strengthening, femoral control, manual techniques, modalities PRN    Consulted and Agree with Plan of Care  Patient       Patient will benefit from skilled therapeutic intervention in order to improve the following deficits and impairments:  Pain, Postural dysfunction, Improper body mechanics, Decreased strength, Decreased range of motion  Visit Diagnosis: Chronic pain of right knee  Chronic pain of left knee  Muscle weakness (generalized)     Problem List Patient Active Problem List   Diagnosis Date Noted  . Arthritis of knee, degenerative  05/22/2018  . Cataract, right eye 05/17/2018  . Aortic stenosis, mild 02/22/2018  . Obesity (BMI 30.0-34.9) 01/18/2018  . Medication monitoring encounter 12/30/2015  . Productive cough 11/02/2015  . Skin lesion 08/04/2015  . 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 Hip pain, chronic 04/02/2015  . Hearing loss of both ears 04/02/2015  . Osteopenia of the elderly 04/02/2015  . Hormone replacement therapy (postmenopausal) 04/02/2015  . Aortic aneurysm without rupture ([place]) 04/02/2015  . Situational anxiety 04/02/2015  . Dermatitis 04/02/2015  . Hyperlipidemia LDL goal <70 11/18/2014  . Incomplete bladder emptying 06/29/2014  . Mixed stress and urge urinary incontinence 06/29/2014  . Urge incontinence of urine 06/29/2014  . Hypertension goal BP (blood pressure) < 140/90 11/06/2013  . CAD (coronary artery disease) 11/06/2013    Joneen Boers PT, DPT   08/20/2018, 4:11 PM  [facility] [address], [place], [address] Phone: [phone]   Fax:  [phone]  Name: Angel [name] MRN: [id number] Date of Birth: 06/08/1931

## 2018-08-20 NOTE — Patient Instructions (Signed)
Pt was recommended to activate her glute max muscles when going up and down hills as well as with stair negotiation. Pt demonstrated and verbalized understanding.

## 2018-08-21 ENCOUNTER — Ambulatory Visit: Payer: Medicare Other | Admitting: Family Medicine

## 2018-08-23 ENCOUNTER — Ambulatory Visit: Payer: Medicare Other

## 2018-08-23 DIAGNOSIS — M25561 Pain in right knee: Secondary | ICD-10-CM | POA: Diagnosis not present

## 2018-08-23 DIAGNOSIS — M6281 Muscle weakness (generalized): Secondary | ICD-10-CM | POA: Diagnosis not present

## 2018-08-23 DIAGNOSIS — M25562 Pain in left knee: Secondary | ICD-10-CM

## 2018-08-23 DIAGNOSIS — G8929 Other chronic pain: Secondary | ICD-10-CM

## 2018-08-23 NOTE — Patient Instructions (Signed)
MedbridgeAccess Code: [email or web address]   Hooklying Clamshell with Resistance  Blue band 10x3

## 2018-08-23 NOTE — Therapy (Signed)
[facility] [address] [place], [address] Phone: [phone]   Fax:  [phone]  Physical Therapy Treatment  Patient Details  Name: Angel [name] MRN: [id number] Date of Birth: 10-Jul-1931 Referring Provider (PT): Enid Derry, MD   Encounter Date: 08/23/2018  PT End of Session - 08/23/18 1308    Visit Number  6    Number of Visits  13    Date for PT Re-Evaluation  08/30/18    Authorization Type  6    Authorization Time Period  of 10 progress report    PT Start Time  1308    PT Stop Time  1347    PT Time Calculation (min)  39 min    Activity Tolerance  Patient tolerated treatment well    Behavior During Therapy  [facility] for tasks assessed/performed       Past Medical History:  Diagnosis Date  . Anxiety   . Aortic aneurysm without rupture ([place])   . CAD (coronary artery disease)   . Chronic hip pain   . Endometriosis   . Hearing loss   . HTN, goal below 150/90   . Hyperlipidemia LDL goal <70 11/18/2014  . Osteopenia of the elderly   . Urge and stress incontinence     Past Surgical History:  Procedure Laterality Date  . ABDOMINAL HYSTERECTOMY  1976   total  . APPENDECTOMY    . BREAST EXCISIONAL BIOPSY Left 1987   benign    There were no vitals filed for this visit.  Subjective Assessment - 08/23/18 1310    Subjective  Has been squeezing her glutes and its helping her knees with stairs. Getting up from a chair or car bothers her knees.     Pertinent History  B knee pain since a year or more. Walking up the steps bothers her. Walking around regularly is no pain. Pt lives in a 2 story home, first floor set up. 14 steps to get to second floor, L banister rail starting a third of the way up.  No steps to enter front and back door.  Gradual onset of B knee pain. Has been to Dr. Marry Guan who did x-rays who said that there is still cartilage in both knees. Was diagnosed with osteoarthritis.  [place] Walking up hills bothers her knees.  Pt also walks about 20 minutes a day walking her dog.  R knee feels fragile when she starts with it to step up a step therefore she starts with her L, then she can step up with her R LE. Also goes to a chiropracter for adjustments.  Pt also has 5 step to go down to her meditation room, R rail assist in her home.  Has not yet used ice for her knees.   Pt adds that she has B foot pronation and plantar arch pain in which her orthotics that she wears helps her a lot.     Patient Stated Goals  Greater mobility, walk longer.     Currently in Pain?  No/denies    Pain Score  0-No pain    Pain Onset  More than a month ago                               PT Education - 08/23/18 1335    Education Details  ther-ex    Person(s) Educated  Patient    Methods  Explanation;Demonstration;Tactile cues;Verbal cues;Handout    Comprehension  Returned demonstration;Verbalized understanding        Objective  No latex band allergies Blood pressure is controlled per pt.   B Medial knee pain, with swelling R > L   MedbridgeAccess Code: [email or web address]   Aortic aneurism osteopenia   Manual therapy  Supine IR R and L tibia grade 1-2 for pain control   Decreased knee pain with sit <> stand from low mat table   Therapeutic exercise   Sit <> stand from low mat table   With blue band resistance to hip abduction/ER 5x. Medial knee discomfort   Then with hip adductor ball squeeze and glute max squeeze 2x. Increased medial knee discomfort.   Then without resistance with glute max squeeze after manual therapy 5x.   Less B knee pain.   Supine clamshell blue band 10x3  Forward step up onto 4 inch step with one UE assist, emphasis on femoral control and glute max squeeze             R LE 10x2             L LE 10x2  Forward step up onto 1st regular step with B UE assist and glute  max squeeze. 2x each LE   Improved exercise technique, movement at target joints, use of target muscles after mod verbal, visual, tactile cues.       Decreased B knee pain with sit <> stand following manual therapy for pain control as well as with glute med and max muscle activation. Pt will benefit from continued skilled physical therapy services to decrease B knee pain, improve strength, and function (such as stair negotiation and walking up and down hills).                        PT Short Term Goals - 07/16/18 1653      PT SHORT TERM GOAL #1   Title  Patient will be independent with her HEP to decrease pain, improve strength and function.     Time  3    Period  Weeks    Status  New    Target Date  08/09/18        PT Long Term Goals - 07/16/18 1653      PT LONG TERM GOAL #1   Title  Patient will have a decrease in B knee pain to 2/10 or less at worst to decrease difficulty negotiating stairs or climbing up hills.     Baseline  R knee 5/10 , L knee 4.5/10 at worst for the past 3 months (07/16/2018)    Time  6    Period  Weeks    Status  New    Target Date  08/30/18      PT LONG TERM GOAL #2   Title  Patient will be able to ascend and descend 4 regular steps with bilateral UE assist at least 2 x with minimal to no complain of B knee pain.     Baseline  Increased B knee pain with ascending and descending 4 regular steps with B UE assist (07/16/2018).     Time  6    Period  Weeks    Status  New    Target Date  08/30/18      PT LONG TERM GOAL #3   Title  Patient will improve B hip  extension and abduction strengh by at least 1/2 MMT to promote ability to negotiate stairs and climb hills with less pain.     Time  6    Period  Weeks    Status  New    Target Date  08/30/18      PT LONG TERM GOAL #4   Title  Patient will improve her 6 minute walk distance by at least 100 ft to promote mobility.     Baseline  1045 ft with L lateral hip pain/discomfort (07/16/2018)     Time  6    Period  Weeks    Status  New    Target Date  08/30/18            Plan - 08/23/18 1308    Clinical Impression Statement  Decreased B knee pain with sit <> stand following manual therapy for pain control as well as with glute med and max muscle activation. Pt will benefit from continued skilled physical therapy services to decrease B knee pain, improve strength, and function (such as stair negotiation and walking up and down hills).     Rehab Potential  Fair    Clinical Impairments Affecting Rehab Potential  (-) age, chronicity of knee pain, hip weakenss. (+) motivated    PT Frequency  2x / week    PT Duration  6 weeks    PT Treatment/Interventions  Aquatic Therapy;Electrical Stimulation;Iontophoresis 4mg /ml Dexamethasone;Gait training;Stair training;Therapeutic activities;Therapeutic exercise;Balance training;Neuromuscular re-education;Patient/family education;Manual techniques;Dry needling    PT Next Visit Plan  hip strengthening, femoral control, manual techniques, modalities PRN    Consulted and Agree with Plan of Care  Patient       Patient will benefit from skilled therapeutic intervention in order to improve the following deficits and impairments:  Pain, Postural dysfunction, Improper body mechanics, Decreased strength, Decreased range of motion  Visit Diagnosis: Chronic pain of right knee  Chronic pain of left knee  Muscle weakness (generalized)     Problem List Patient Active Problem List   Diagnosis Date Noted  . Arthritis of knee, degenerative 05/22/2018  . Cataract, right eye 05/17/2018  . Aortic stenosis, mild 02/22/2018  . Obesity (BMI 30.0-34.9) 01/18/2018  . Medication monitoring encounter 12/30/2015  . Productive cough 11/02/2015  . Skin lesion 08/04/2015  . 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 Hip pain, chronic 04/02/2015  . Hearing loss of both ears 04/02/2015  . Osteopenia of the elderly 04/02/2015  . Hormone replacement therapy (postmenopausal)  04/02/2015  . Aortic aneurysm without rupture ([place]) 04/02/2015  . Situational anxiety 04/02/2015  . Dermatitis 04/02/2015  . Hyperlipidemia LDL goal <70 11/18/2014  . Incomplete bladder emptying 06/29/2014  . Mixed stress and urge urinary incontinence 06/29/2014  . Urge incontinence of urine 06/29/2014  . Hypertension goal BP (blood pressure) < 140/90 11/06/2013  . CAD (coronary artery disease) 11/06/2013    Joneen Boers PT, DPT   08/23/2018, 4:09 PM  [facility] [address] [place], [address] Phone: [phone]   Fax:  [phone]  Name: NYKOLE MATOS MRN: [id number] Date of Birth: 03-16-1931

## 2018-08-27 ENCOUNTER — Ambulatory Visit: Payer: Medicare Other

## 2018-08-27 DIAGNOSIS — M25562 Pain in left knee: Secondary | ICD-10-CM

## 2018-08-27 DIAGNOSIS — M6281 Muscle weakness (generalized): Secondary | ICD-10-CM | POA: Diagnosis not present

## 2018-08-27 DIAGNOSIS — G8929 Other chronic pain: Secondary | ICD-10-CM

## 2018-08-27 DIAGNOSIS — M25561 Pain in right knee: Principal | ICD-10-CM

## 2018-08-27 NOTE — Therapy (Signed)
[facility] [address] [place], [address] Phone: [phone]   Fax:  [phone]  Physical Therapy Treatment  Patient Details  Name: Angel [name] MRN: [id number] Date of Birth: 03-03-1931 Referring Provider (PT): Enid Derry, MD   Encounter Date: 08/27/2018  PT End of Session - 08/27/18 1438    Visit Number  7    Number of Visits  13    Date for PT Re-Evaluation  08/30/18    Authorization Type  7    Authorization Time Period  of 10 progress report    PT Start Time  1438    PT Stop Time  1519    PT Time Calculation (min)  41 min    Activity Tolerance  Patient tolerated treatment well    Behavior During Therapy  [facility] for tasks assessed/performed       Past Medical History:  Diagnosis Date  . Anxiety   . Aortic aneurysm without rupture ([place])   . CAD (coronary artery disease)   . Chronic hip pain   . Endometriosis   . Hearing loss   . HTN, goal below 150/90   . Hyperlipidemia LDL goal <70 11/18/2014  . Osteopenia of the elderly   . Urge and stress incontinence     Past Surgical History:  Procedure Laterality Date  . ABDOMINAL HYSTERECTOMY  1976   total  . APPENDECTOMY    . BREAST EXCISIONAL BIOPSY Left 1987   benign    There were no vitals filed for this visit.  Subjective Assessment - 08/27/18 1439    Subjective  Pt states she thinks she is some better. Has been doing her HEP such as clamshells, and trying to walk heel to toe.  0/10 knee pain bilaterally currently.  4/10 R knee and L knee at most for the past 7 days.     Pertinent History  B knee pain since a year or more. Walking up the steps bothers her. Walking around regularly is no pain. Pt lives in a 2 story home, first floor set up. 14 steps to get to second floor, L banister rail starting a third of the way up.  No steps to enter front and back door.  Gradual onset of B knee pain. Has been to Dr. Marry Guan who did x-rays who said that  there is still cartilage in both knees. Was diagnosed with osteoarthritis.  [place] Walking up hills bothers her knees.  Pt also walks about 20 minutes a day walking her dog.  R knee feels fragile when she starts with it to step up a step therefore she starts with her L, then she can step up with her R LE. Also goes to a chiropracter for adjustments.  Pt also has 5 step to go down to her meditation room, R rail assist in her home.  Has not yet used ice for her knees.   Pt adds that she has B foot pronation and plantar arch pain in which her orthotics that she wears helps her a lot.     Patient Stated Goals  Greater mobility, walk longer.     Currently in Pain?  No/denies    Pain Score  0-No pain    Pain Onset  More than a month ago         [facility] PT Assessment - 08/27/18 1442  Strength   Right Hip Extension  4-/5   seated manually resisted hip extension   Right Hip ABduction  5/5   seated manually resisted clamshell   Left Hip Extension  4/5   seated manually resisted hip extension   Left Hip ABduction  5/5   seated manually resisted clamshell                          PT Education - 08/27/18 1441    Education Details  ther-ex    Person(s) Educated  Patient    Methods  Explanation;Demonstration;Tactile cues;Verbal cues    Comprehension  Returned demonstration;Verbalized understanding      Objective  No latex band allergies Blood pressure is controlled per pt.   B Medial knee pain, with swelling R > L   MedbridgeAccess Code: [email or web address]   Aortic aneurism osteopenia   Therapeutic exercise   Seated manually resisted hip extension and clamshell isometrics 1x each way for each LE  Reviewed progress/current status with hip strength with pt.   Gait x 6 minutes, no AD  1083 ft (improved compared to eval)  Standing LE leg press with B UE assist double blue band resistance  R  10x3  L 10x3  Seated hip adductor ball and glute max squeeze 10x5 seconds for 3 sets  Reviewed and given as part of her HEP. Pt demonstrated and verbalized understanding.   Forward step up onto 4 inch step with one UE assist, emphasis on femoral control and glute max squeeze R LE 10x2  L LE 10x2  Ascending and descending 4 regular steps with B UE assist and with femoral control and glute max muscle activation 1x  Minimal discomfort B knees going up, moderate discomfort going down the steps  Standing hip abduction resisting yellow band at ankles, B UE assist 10x each LE  Standing hip extension resisting yellow band around ankles. B UE assist 10x3 each LE   Forward step up onto 1st regular step with B UE assist and glute max squeeze. 5x each LE   Minimal to moderate B knee discomfort.     Improved exercise technique, movement at target joints, use of target muscles after min to mod verbal, visual, tactile cues.    Decreased B knee pain with step ups following hip adduction and glute max squeeze exercise. Increased knee discomfort with increased stair negotiation.     Pt demonstrates improved glute med strength and ability to ascend regular steps more comfortably. Increased B knee discomfort with increased repetition. Overall improved comfort level with stepping up with activation of glute muscles and emphasis on slight hip abduction/ER. Pt also demonstrates improved 6 minute walk distance compared to initial evaluation. Pt still demonstrates B knee pain, hip weakness, and difficulty performing functional tasks such as stair negotiation, as well as walking on uneven terrain. Patient will benefit from continued skilled physical therapy services to address the aforementioned deficits.               PT Short Term Goals - 07/16/18 1653      PT SHORT TERM GOAL #1   Title  Patient will be independent with her HEP to decrease pain, improve strength and  function.     Time  3    Period  Weeks    Status  New    Target Date  08/09/18        PT Long Term Goals - 07/16/18 1653  PT LONG TERM GOAL #1   Title  Patient will have a decrease in B knee pain to 2/10 or less at worst to decrease difficulty negotiating stairs or climbing up hills.     Baseline  R knee 5/10 , L knee 4.5/10 at worst for the past 3 months (07/16/2018)    Time  6    Period  Weeks    Status  New    Target Date  08/30/18      PT LONG TERM GOAL #2   Title  Patient will be able to ascend and descend 4 regular steps with bilateral UE assist at least 2 x with minimal to no complain of B knee pain.     Baseline  Increased B knee pain with ascending and descending 4 regular steps with B UE assist (07/16/2018).     Time  6    Period  Weeks    Status  New    Target Date  08/30/18      PT LONG TERM GOAL #3   Title  Patient will improve B hip extension and abduction strengh by at least 1/2 MMT to promote ability to negotiate stairs and climb hills with less pain.     Time  6    Period  Weeks    Status  New    Target Date  08/30/18      PT LONG TERM GOAL #4   Title  Patient will improve her 6 minute walk distance by at least 100 ft to promote mobility.     Baseline  1045 ft with L lateral hip pain/discomfort (07/16/2018)    Time  6    Period  Weeks    Status  New    Target Date  08/30/18            Plan - 08/27/18 1437    Clinical Impression Statement  Pt demonstrates improved glute med strength and ability to ascend regular steps more comfortably. Increased B knee discomfort with increased repetition. Overall improved comfort level with stepping up with activation of glute muscles and emphasis on slight hip abduction/ER. Pt also demonstrates improved 6 minute walk distance compared to initial evaluation. Pt still demonstrates B knee pain, hip weakness, and difficulty performing functional tasks such as stair negotiation, as well as walking on uneven terrain.  Patient will benefit from continued skilled physical therapy services to address the aforementioned deficits.     Rehab Potential  Fair    Clinical Impairments Affecting Rehab Potential  (-) age, chronicity of knee pain, hip weakenss. (+) motivated    PT Frequency  2x / week    PT Duration  6 weeks    PT Treatment/Interventions  Aquatic Therapy;Electrical Stimulation;Iontophoresis 4mg /ml Dexamethasone;Gait training;Stair training;Therapeutic activities;Therapeutic exercise;Balance training;Neuromuscular re-education;Patient/family education;Manual techniques;Dry needling    PT Next Visit Plan  hip strengthening, femoral control, manual techniques, modalities PRN    Consulted and Agree with Plan of Care  Patient       Patient will benefit from skilled therapeutic intervention in order to improve the following deficits and impairments:  Pain, Postural dysfunction, Improper body mechanics, Decreased strength, Decreased range of motion  Visit Diagnosis: Chronic pain of right knee  Chronic pain of left knee  Muscle weakness (generalized)     Problem List Patient Active Problem List   Diagnosis Date Noted  . Arthritis of knee, degenerative 05/22/2018  . Cataract, right eye 05/17/2018  . Aortic stenosis, mild 02/22/2018  . Obesity (BMI 30.0-34.9)  01/18/2018  . Medication monitoring encounter 12/30/2015  . Productive cough 11/02/2015  . Skin lesion 08/04/2015  . 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 Hip pain, chronic 04/02/2015  . Hearing loss of both ears 04/02/2015  . Osteopenia of the elderly 04/02/2015  . Hormone replacement therapy (postmenopausal) 04/02/2015  . Aortic aneurysm without rupture ([place]) 04/02/2015  . Situational anxiety 04/02/2015  . Dermatitis 04/02/2015  . Hyperlipidemia LDL goal <70 11/18/2014  . Incomplete bladder emptying 06/29/2014  . Mixed stress and urge urinary incontinence 06/29/2014  . Urge incontinence of urine 06/29/2014  . Hypertension goal BP (blood  pressure) < 140/90 11/06/2013  . CAD (coronary artery disease) 11/06/2013    Joneen Boers PT, DPT   08/27/2018, 3:36 PM  [facility] [address] [place], [address] Phone: [phone]   Fax:  [phone]  Name: Angel [name] MRN: [id number] Date of Birth: 12/24/1930

## 2018-08-27 NOTE — Patient Instructions (Addendum)
  Seated hip adductor ball and glute max squeeze 10x5 seconds for 3 sets  Reviewed and given as part of her HEP. Pt demonstrated and verbalized understanding.

## 2018-08-28 ENCOUNTER — Ambulatory Visit: Payer: Medicare Other

## 2018-08-30 ENCOUNTER — Ambulatory Visit: Payer: Medicare Other

## 2018-09-03 ENCOUNTER — Ambulatory Visit: Payer: Medicare Other

## 2018-09-03 DIAGNOSIS — H5212 Myopia, left eye: Secondary | ICD-10-CM | POA: Diagnosis not present

## 2018-09-03 DIAGNOSIS — H2520 Age-related cataract, morgagnian type, unspecified eye: Secondary | ICD-10-CM | POA: Diagnosis not present

## 2018-09-03 DIAGNOSIS — H2512 Age-related nuclear cataract, left eye: Secondary | ICD-10-CM | POA: Diagnosis not present

## 2018-09-03 DIAGNOSIS — H52222 Regular astigmatism, left eye: Secondary | ICD-10-CM | POA: Diagnosis not present

## 2018-09-03 DIAGNOSIS — H25812 Combined forms of age-related cataract, left eye: Secondary | ICD-10-CM | POA: Diagnosis not present

## 2018-09-03 DIAGNOSIS — Z01818 Encounter for other preprocedural examination: Secondary | ICD-10-CM | POA: Diagnosis not present

## 2018-09-04 ENCOUNTER — Ambulatory Visit: Payer: Medicare Other

## 2018-09-06 ENCOUNTER — Ambulatory Visit: Payer: Medicare Other

## 2018-09-10 ENCOUNTER — Ambulatory Visit: Payer: Medicare Other

## 2018-09-11 ENCOUNTER — Ambulatory Visit: Payer: Medicare Other

## 2018-09-13 ENCOUNTER — Ambulatory Visit: Payer: Medicare Other

## 2018-09-14 DIAGNOSIS — H25811 Combined forms of age-related cataract, right eye: Secondary | ICD-10-CM | POA: Diagnosis not present

## 2018-09-14 DIAGNOSIS — H2511 Age-related nuclear cataract, right eye: Secondary | ICD-10-CM | POA: Diagnosis not present

## 2018-09-17 ENCOUNTER — Ambulatory Visit: Payer: Medicare Other

## 2018-09-18 ENCOUNTER — Ambulatory Visit: Payer: Medicare Other

## 2018-09-18 DIAGNOSIS — M62830 Muscle spasm of back: Secondary | ICD-10-CM | POA: Diagnosis not present

## 2018-09-18 DIAGNOSIS — M9901 Segmental and somatic dysfunction of cervical region: Secondary | ICD-10-CM | POA: Diagnosis not present

## 2018-09-18 DIAGNOSIS — M5033 Other cervical disc degeneration, cervicothoracic region: Secondary | ICD-10-CM | POA: Diagnosis not present

## 2018-09-18 DIAGNOSIS — M9903 Segmental and somatic dysfunction of lumbar region: Secondary | ICD-10-CM | POA: Diagnosis not present

## 2018-09-19 ENCOUNTER — Telehealth: Payer: Self-pay | Admitting: Family Medicine

## 2018-09-19 NOTE — Telephone Encounter (Signed)
I left a message asking the patient to call and schedule Medicare AWV-S with Nurse Health Advisor, [place].  If the patient calls back, please schedule Medicare Wellness Visit with Nurse Health Advisor. Last AWV 06/20/14 VDM (DD)

## 2018-09-20 ENCOUNTER — Ambulatory Visit: Payer: Medicare Other | Attending: Family Medicine

## 2018-09-20 DIAGNOSIS — M6281 Muscle weakness (generalized): Secondary | ICD-10-CM | POA: Diagnosis not present

## 2018-09-20 DIAGNOSIS — M25562 Pain in left knee: Secondary | ICD-10-CM | POA: Diagnosis not present

## 2018-09-20 DIAGNOSIS — R262 Difficulty in walking, not elsewhere classified: Secondary | ICD-10-CM | POA: Diagnosis not present

## 2018-09-20 DIAGNOSIS — M25561 Pain in right knee: Secondary | ICD-10-CM | POA: Insufficient documentation

## 2018-09-20 DIAGNOSIS — G8929 Other chronic pain: Secondary | ICD-10-CM | POA: Insufficient documentation

## 2018-09-20 DIAGNOSIS — M79671 Pain in right foot: Secondary | ICD-10-CM | POA: Insufficient documentation

## 2018-09-20 DIAGNOSIS — M79672 Pain in left foot: Secondary | ICD-10-CM | POA: Diagnosis not present

## 2018-09-20 NOTE — Therapy (Signed)
[facility] [address], [place], [address] Phone: [phone]   Fax:  [phone]  Physical Therapy Treatment  Patient Details  Name: Angel [name] MRN: [id number] Date of Birth: [birth year] Referring Provider (PT): Enid Derry, MD   Encounter Date: 09/20/2018  PT End of Session - 09/20/18 1515    Visit Number  8    Number of Visits  25    Date for PT Re-Evaluation  11/01/18    Authorization Type  8    Authorization Time Period  of 10 progress report    PT Start Time  1515    PT Stop Time  1600    PT Time Calculation (min)  45 min    Activity Tolerance  Patient tolerated treatment well    Behavior During Therapy  [facility] for tasks assessed/performed       Past Medical History:  Diagnosis Date  . Anxiety   . Aortic aneurysm without rupture ([place])   . CAD (coronary artery disease)   . Chronic hip pain   . Endometriosis   . Hearing loss   . HTN, goal below 150/90   . Hyperlipidemia LDL goal <70 11/18/2014  . Osteopenia of the elderly   . Urge and stress incontinence     Past Surgical History:  Procedure Laterality Date  . ABDOMINAL HYSTERECTOMY  1976   total  . APPENDECTOMY    . BREAST EXCISIONAL BIOPSY Left 1987   benign    There were no vitals filed for this visit.  Subjective Assessment - 09/20/18 1516    Subjective  Arthritis has increased. Had R and L eye surgery in the last 3.5 weeks. Most recent one was 6 days ago. Doctor ok with continuing PT today.  4.5/10 R knee pain currently (6/10 at worst for the past 7 days). 4.5/10 L knee pain currently and at worst for the past 7 days. Feels like the weather has played a role it seems.   Wants to continue PT.  Pt also states having B heel pain since 2014. Recently started acting up again, especially on L side of her heel/foot.   5/10 L heel pain currently (aggravating factors include walking, putting pressure on it, walking up steps). Feels like  going up and down steps is better since starting PT.     Pertinent History  B knee pain since a year or more. Walking up the steps bothers her. Walking around regularly is no pain. Pt lives in a 2 story home, first floor set up. 14 steps to get to second floor, L banister rail starting a third of the way up.  No steps to enter front and back door.  Gradual onset of B knee pain. Has been to Dr. Marry Guan who did x-rays who said that there is still cartilage in both knees. Was diagnosed with osteoarthritis.  [place]. Walking up hills bothers her knees.  Pt also walks about 20 minutes a day walking her dog.  R knee feels fragile when she starts with it to step up a step therefore she starts with her L, then she can step up with her R LE. Also goes to a chiropracter for adjustments.  Pt also has 5 step to go down to her meditation room, R rail assist in her home.  Has not yet used ice for her knees.   Pt adds that  she has B foot pronation and plantar arch pain in which her orthotics that she wears helps her a lot.     Patient Stated Goals  Greater mobility, walk longer.     Currently in Pain?  Yes    Pain Score  5    4.5/10 B knee pain   Pain Onset  More than a month ago                               PT Education - 09/20/18 1524    Education Details  ther-ex, plan of care    Person(s) Educated  Patient    Methods  Explanation;Demonstration;Tactile cues;Verbal cues    Comprehension  Returned demonstration;Verbalized understanding      Objective  No latex band allergies Blood pressure is controlled per pt.   B Medial knee pain, with swelling R > L   MedbridgeAccess Code: [email or web address]   Aortic aneurism osteopenia  Limited L ankle DF AROM ( to neutral, stiff end feel)   Therapeutic exercise   Reviewed plan of care: 2x/week for 6 weeks   Gait x 6 minutes, no AD   Antalgic, decreased stance L  LE, then antalgic, decreased stance R LE              1082 ft (improved compared to eval)  Reviewed progress/current status with 6 minute walk with pt.    seated L ankle DF AROM  Limited AROM (to neutral, stiff and feel)  Improved exercise technique, movement at target joints, use of target muscles after min to mod verbal, visual, tactile cues.     Manual therapy   Seated sustained A to P pressure to L ankle  Felt good for patient  Seated STM to distal gastroc and achilles tendon to help decrease tension   Decreased L heel pain with gait afterwards  Supine R tibia IR grade 1-2 for pain control   Decreased R knee pain to 2.5/10 afterwards with gait    Decreased R knee and no L knee pain, and decreased L heel pain aftrer session.     Pt demonstrates recent re-aggravation of bilateral heel pain. Decreased L heel and ankle symptoms during gait with treatment to gently promote posterior mobility at her L talocrural joint as well as to decease tension at her L gastroc/soleus muscles to promote ankle dorsiflexion while walking. Pt also demonstrates improved bilateral glute med strength as well as slight decreased discomfort with stair negotiation with activation of her glute muscles and decreasing valgus when going up and down steps. Pt will benefit from continued skilled physical therapy services to decrease bilateral knee and heel pain, improve strength, femoral control, and function.            PT Short Term Goals - 09/20/18 1905      PT SHORT TERM GOAL #1   Title  Patient will be independent with her HEP to decrease pain, improve strength and function.     Time  3    Period  Weeks    Status  On-going    Target Date  10/11/18        PT Long Term Goals - 09/20/18 1532      PT LONG TERM GOAL #1   Title  Patient will have a decrease in B knee pain to 2/10 or less at worst to decrease difficulty negotiating stairs or climbing up hills.     Baseline  R knee 5/10 , L  knee 4.5/10 at worst for the past 3 months (07/16/2018); 6/10 R knee pain at most, 4.5/10 L knee pain at most for the past 7 days (09/20/2018)    Time  6    Period  Weeks    Status  On-going    Target Date  11/01/18      PT LONG TERM GOAL #2   Title  Patient will be able to ascend and descend 4 regular steps with bilateral UE assist at least 2 x with minimal to no complain of B knee pain.     Baseline  Increased B knee pain with ascending and descending 4 regular steps with B UE assist (07/16/2018). Still has pain but feels slight improvement (09/20/2018)    Time  6    Period  Weeks    Status  On-going    Target Date  11/01/18      PT LONG TERM GOAL #3   Title  Patient will improve B hip extension and abduction strengh by at least 1/2 MMT to promote ability to negotiate stairs and climb hills with less pain.     Time  6    Period  Weeks    Status  Partially Met    Target Date  11/01/18      PT LONG TERM GOAL #4   Title  Patient will improve her 6 minute walk distance by at least 100 ft to promote mobility.     Baseline  1045 ft with L lateral hip pain/discomfort (07/16/2018); 1082 ft (09/20/2018)    Time  6    Period  Weeks    Status  Partially Met    Target Date  11/01/18            Plan - 09/20/18 1510    Clinical Impression Statement  Pt demonstrates recent re-aggravation of bilateral heel pain. Decreased L heel and ankle symptoms during gait with treatment to gently promote posterior mobility at her L talocrural joint as well as to decease tension at her L gastroc/soleus muscles to promote ankle dorsiflexion while walking. Pt also demonstrates improved bilateral glute med strength as well as slight decreased discomfort with stair negotiation with activation of her glute muscles and decreasing valgus when going up and down steps. Pt will benefit from continued skilled physical therapy services to decrease bilateral knee and heel pain, improve strength, femoral control, and function.     History and Personal Factors relevant to plan of care:  B knee pain, hip weakness, decreased femoral control, aortic aneurism, B knee pain with stair negotiation, B heel pain    Clinical Presentation  Evolving    Clinical Presentation due to:  recent onset of B heel pain    Clinical Decision Making  Moderate    Rehab Potential  Fair    Clinical Impairments Affecting Rehab Potential  (-) age, chronicity of knee pain, hip weakenss. (+) motivated    PT Frequency  2x / week    PT Duration  6 weeks    PT Treatment/Interventions  Aquatic Therapy;Electrical Stimulation;Iontophoresis 8m/ml Dexamethasone;Gait training;Stair training;Therapeutic activities;Therapeutic exercise;Balance training;Neuromuscular re-education;Patient/family education;Manual techniques;Dry needling    PT Next Visit Plan  hip strengthening, femoral control, manual techniques, modalities PRN    Consulted and Agree with Plan of Care  Patient       Patient will benefit from skilled therapeutic intervention in order to improve the following deficits and impairments:  Pain, Postural dysfunction, Improper body mechanics, Decreased  strength, Decreased range of motion  Visit Diagnosis: Chronic pain of right knee - Plan: PT plan of care cert/re-cert  Chronic pain of left knee - Plan: PT plan of care cert/re-cert  Muscle weakness (generalized) - Plan: PT plan of care cert/re-cert  Pain in left foot - Plan: PT plan of care cert/re-cert  Pain in right foot - Plan: PT plan of care cert/re-cert  Difficulty in walking, not elsewhere classified - Plan: PT plan of care cert/re-cert     Problem List Patient Active Problem List   Diagnosis Date Noted  . Arthritis of knee, degenerative 05/22/2018  . Cataract, right eye 05/17/2018  . Aortic stenosis, mild 02/22/2018  . Obesity (BMI 30.0-34.9) 01/18/2018  . Medication monitoring encounter 12/30/2015  . Productive cough 11/02/2015  . Skin lesion 08/04/2015  . Crossover toe  08/04/2015  . Hip pain, chronic 04/02/2015  . Hearing loss of both ears 04/02/2015  . Osteopenia of the elderly 04/02/2015  . Hormone replacement therapy (postmenopausal) 04/02/2015  . Aortic aneurysm without rupture ([place]) 04/02/2015  . Situational anxiety 04/02/2015  . Dermatitis 04/02/2015  . Hyperlipidemia LDL goal <70 11/18/2014  . Incomplete bladder emptying 06/29/2014  . Mixed stress and urge urinary incontinence 06/29/2014  . Urge incontinence of urine 06/29/2014  . Hypertension goal BP (blood pressure) < 140/90 11/06/2013  . CAD (coronary artery disease) 11/06/2013    Joneen Boers PT, DPT   09/20/2018, 7:23 PM  [facility] [address], [place], [address] Phone: [phone]   Fax:  [phone]  Name: Angel [name] MRN: [id number] Date of Birth: 1931/05/20

## 2018-09-24 ENCOUNTER — Ambulatory Visit: Payer: Medicare Other

## 2018-09-24 DIAGNOSIS — M79672 Pain in left foot: Secondary | ICD-10-CM

## 2018-09-24 DIAGNOSIS — M25561 Pain in right knee: Secondary | ICD-10-CM | POA: Diagnosis not present

## 2018-09-24 DIAGNOSIS — M79671 Pain in right foot: Secondary | ICD-10-CM

## 2018-09-24 DIAGNOSIS — R262 Difficulty in walking, not elsewhere classified: Secondary | ICD-10-CM

## 2018-09-24 DIAGNOSIS — M25562 Pain in left knee: Secondary | ICD-10-CM | POA: Diagnosis not present

## 2018-09-24 DIAGNOSIS — M6281 Muscle weakness (generalized): Secondary | ICD-10-CM

## 2018-09-24 DIAGNOSIS — G8929 Other chronic pain: Secondary | ICD-10-CM | POA: Diagnosis not present

## 2018-09-24 NOTE — Therapy (Signed)
[facility] [address] [place], [address] Phone: [phone]   Fax:  [phone]  Physical Therapy Treatment  Patient Details  Name: Angel [name] MRN: [id number] Date of Birth: [birth year] Referring Provider (PT): Enid Derry, MD   Encounter Date: 09/24/2018  PT End of Session - 09/24/18 1352    Visit Number  9    Number of Visits  25    Date for PT Re-Evaluation  11/01/18    Authorization Type  9    Authorization Time Period  of 10 progress report    PT Start Time  1352    PT Stop Time  1433    PT Time Calculation (min)  41 min    Activity Tolerance  Patient tolerated treatment well    Behavior During Therapy  [facility] for tasks assessed/performed       Past Medical History:  Diagnosis Date  . Anxiety   . Aortic aneurysm without rupture ([place])   . CAD (coronary artery disease)   . Chronic hip pain   . Endometriosis   . Hearing loss   . HTN, goal below 150/90   . Hyperlipidemia LDL goal <70 11/18/2014  . Osteopenia of the elderly   . Urge and stress incontinence     Past Surgical History:  Procedure Laterality Date  . ABDOMINAL HYSTERECTOMY  1976   total  . APPENDECTOMY    . BREAST EXCISIONAL BIOPSY Left 1987   benign    There were no vitals filed for this visit.  Subjective Assessment - 09/24/18 1353    Subjective  The ankle manual therapy helped so much. Has not had the kind of foot pain since then.  No L ankle or heel pain currently.  Last heel and ankle pain was last session.    No B knee pain today. Walking up and down the step is still painful.     Pertinent History  B knee pain since a year or more. Walking up the steps bothers her. Walking around regularly is no pain. Pt lives in a 2 story home, first floor set up. 14 steps to get to second floor, L banister rail starting a third of the way up.  No steps to enter front and back door.  Gradual onset of B knee pain. Has been to Dr.  Marry Guan who did x-rays who said that there is still cartilage in both knees. Was diagnosed with osteoarthritis.  [place]. Walking up hills bothers her knees.  Pt also walks about 20 minutes a day walking her dog.  R knee feels fragile when she starts with it to step up a step therefore she starts with her L, then she can step up with her R LE. Also goes to a chiropracter for adjustments.  Pt also has 5 step to go down to her meditation room, R rail assist in her home.  Has not yet used ice for her knees.   Pt adds that she has B foot pronation and plantar arch pain in which her orthotics that she wears helps her a lot.     Patient Stated Goals  Greater mobility, walk longer.     Currently in Pain?  No/denies    Pain Score  0-No pain    Pain Onset  More than a month ago  PT Education - 09/24/18 1432    Education Details  ther-ex    Person(s) Educated  Patient    Methods  Explanation;Demonstration;Tactile cues;Verbal cues    Comprehension  Returned demonstration;Verbalized understanding      Objective  No latex band allergies Blood pressure is controlled per pt.   B Medial knee pain, with swelling R > L   MedbridgeAccess Code: [email or web address]   Aortic aneurism osteopenia  Limited L ankle DF AROM ( to neutral, stiff end feel)   Therapeutic exercise   Seated manually resisted hip extension   R 10x2  L 10x2  LAQ 5 lbs pain free range  R 5x2  L 5x2    Standing LE leg press with B UE assist double blue band resistance             R 10x3             L 10x3  Side stepping with yellow band around thighs with B UE assist 5 ft to the L and 5 ft to the R 5x to promote glute med muscle use.     Improved exercise technique, movement at target joints, use of target muscles after min to mod verbal, visual, tactile cues.   Good muscle use felt with exercises. Able  to perform without aggravation of symptoms.     Manual therapy  Supine R tibia IR grade 1-2 for pain control   Supine L tibia IR grade 1-2 for pain control   Supine STM R and L lateral hamstrings to decrease tension and decrease ER of tibia   Improved L ankle pain and B knee pain since last session based on subjective reports. Continued working on decreasing tibial ER as well as promoting glute strengthening to improve femoral control to promote ability to perform standing tasks with less knee pain. Pt tolerated session well without aggravation of symptoms. Pt will benefit from continued skilled physical therapy services to decrease pain, improve strength and function.       PT Short Term Goals - 09/20/18 1905      PT SHORT TERM GOAL #1   Title  Patient will be independent with her HEP to decrease pain, improve strength and function.     Time  3    Period  Weeks    Status  On-going    Target Date  10/11/18        PT Long Term Goals - 09/20/18 1532      PT LONG TERM GOAL #1   Title  Patient will have a decrease in B knee pain to 2/10 or less at worst to decrease difficulty negotiating stairs or climbing up hills.     Baseline  R knee 5/10 , L knee 4.5/10 at worst for the past 3 months (07/16/2018); 6/10 R knee pain at most, 4.5/10 L knee pain at most for the past 7 days (09/20/2018)    Time  6    Period  Weeks    Status  On-going    Target Date  11/01/18      PT LONG TERM GOAL #2   Title  Patient will be able to ascend and descend 4 regular steps with bilateral UE assist at least 2 x with minimal to no complain of B knee pain.     Baseline  Increased B knee pain with ascending and descending 4 regular steps with B UE assist (07/16/2018). Still has pain but feels slight improvement (09/20/2018)    Time  6    Period  Weeks    Status  On-going    Target Date  11/01/18      PT LONG TERM GOAL #3   Title  Patient will improve B hip extension and abduction strengh by at  least 1/2 MMT to promote ability to negotiate stairs and climb hills with less pain.     Time  6    Period  Weeks    Status  Partially Met    Target Date  11/01/18      PT LONG TERM GOAL #4   Title  Patient will improve her 6 minute walk distance by at least 100 ft to promote mobility.     Baseline  1045 ft with L lateral hip pain/discomfort (07/16/2018); 1082 ft (09/20/2018)    Time  6    Period  Weeks    Status  Partially Met    Target Date  11/01/18            Plan - 09/24/18 1432    Clinical Impression Statement  Improved L ankle pain and B knee pain since last session based on subjective reports. Continued working on decreasing tibial ER as well as promoting glute strengthening to improve femoral control to promote ability to perform standing tasks with less knee pain. Pt tolerated session well without aggravation of symptoms. Pt will benefit from continued skilled physical therapy services to decrease pain, improve strength and function.     Rehab Potential  Fair    Clinical Impairments Affecting Rehab Potential  (-) age, chronicity of knee pain, hip weakenss. (+) motivated    PT Frequency  2x / week    PT Duration  6 weeks    PT Treatment/Interventions  Aquatic Therapy;Electrical Stimulation;Iontophoresis 58m/ml Dexamethasone;Gait training;Stair training;Therapeutic activities;Therapeutic exercise;Balance training;Neuromuscular re-education;Patient/family education;Manual techniques;Dry needling    PT Next Visit Plan  hip strengthening, femoral control, manual techniques, modalities PRN    Consulted and Agree with Plan of Care  Patient       Patient will benefit from skilled therapeutic intervention in order to improve the following deficits and impairments:  Pain, Postural dysfunction, Improper body mechanics, Decreased strength, Decreased range of motion  Visit Diagnosis: Chronic pain of right knee  Chronic pain of left knee  Muscle weakness (generalized)  Pain in  left foot  Pain in right foot  Difficulty in walking, not elsewhere classified     Problem List Patient Active Problem List   Diagnosis Date Noted  . Arthritis of knee, degenerative 05/22/2018  . Cataract, right eye 05/17/2018  . Aortic stenosis, mild 02/22/2018  . Obesity (BMI 30.0-34.9) 01/18/2018  . Medication monitoring encounter 12/30/2015  . Productive cough 11/02/2015  . Skin lesion 08/04/2015  . 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 Hip pain, chronic 04/02/2015  . Hearing loss of both ears 04/02/2015  . Osteopenia of the elderly 04/02/2015  . Hormone replacement therapy (postmenopausal) 04/02/2015  . Aortic aneurysm without rupture ([place] 04/02/2015  . Situational anxiety 04/02/2015  . Dermatitis 04/02/2015  . Hyperlipidemia LDL goal <70 11/18/2014  . Incomplete bladder emptying 06/29/2014  . Mixed stress and urge urinary incontinence 06/29/2014  . Urge incontinence of urine 06/29/2014  . Hypertension goal BP (blood pressure) < 140/90 11/06/2013  . CAD (coronary artery disease) 11/06/2013    MJoneen BoersPT, DPT   09/24/2018, 7:48 PM  [facility] [address] [place] [address]Phone: [phone]  Fax:  [phone]  Name: JESLIN BAZINET MRN: [id number] Date of Birth: 1930-10-28

## 2018-09-25 ENCOUNTER — Ambulatory Visit (INDEPENDENT_AMBULATORY_CARE_PROVIDER_SITE_OTHER): Payer: Medicare Other

## 2018-09-25 ENCOUNTER — Ambulatory Visit: Payer: Medicare Other

## 2018-09-25 DIAGNOSIS — N3946 Mixed incontinence: Secondary | ICD-10-CM | POA: Diagnosis not present

## 2018-09-25 NOTE — Progress Notes (Signed)
PTNS  Session # 1  Health & Social Factors: N/A Caffeine: 0 Alcohol: 0 Daytime voids #per day: 10 Night-time voids #per night: 2-3 Urgency: Mild Incontinence Episodes #per day: 1 Ankle used: Right Treatment Setting: 15 Feeling/ Response: Sensory Comments: Consent form signed and scanned into chart  Preformed By: Gordy Clement, CMA (AAMA)   Follow Up: RTC as scheduled

## 2018-09-27 ENCOUNTER — Ambulatory Visit: Payer: Medicare Other

## 2018-09-27 DIAGNOSIS — M25562 Pain in left knee: Secondary | ICD-10-CM

## 2018-09-27 DIAGNOSIS — M25561 Pain in right knee: Principal | ICD-10-CM

## 2018-09-27 DIAGNOSIS — M6281 Muscle weakness (generalized): Secondary | ICD-10-CM

## 2018-09-27 DIAGNOSIS — M79671 Pain in right foot: Secondary | ICD-10-CM | POA: Diagnosis not present

## 2018-09-27 DIAGNOSIS — M79672 Pain in left foot: Secondary | ICD-10-CM | POA: Diagnosis not present

## 2018-09-27 DIAGNOSIS — G8929 Other chronic pain: Secondary | ICD-10-CM | POA: Diagnosis not present

## 2018-09-27 NOTE — Therapy (Signed)
[place] [facility] [address], [place], [address] Phone: [phone]   Fax:  [phone]  Physical Therapy Treatment And Progress Report  (07/16/2018 - 09/27/2018)  Patient Details  Name: Angel [name] MRN: [id number] Date of Birth: Apr 12, 1931 Referring Provider (PT): Enid Derry, MD   Encounter Date: 09/27/2018  PT End of Session - 09/27/18 1308    Visit Number  10    Number of Visits  25    Date for PT Re-Evaluation  11/01/18    Authorization Type  10    Authorization Time Period  of 10 progress report    PT Start Time  1312   pt arrived late   PT Stop Time  1346    PT Time Calculation (min)  34 min    Activity Tolerance  Patient tolerated treatment well    Behavior During Therapy  [facility] for tasks assessed/performed       Past Medical History:  Diagnosis Date  . Anxiety   . Aortic aneurysm without rupture ([place])   . CAD (coronary artery disease)   . Chronic hip pain   . Endometriosis   . Hearing loss   . HTN, goal below 150/90   . Hyperlipidemia LDL goal <70 11/18/2014  . Osteopenia of the elderly   . Urge and stress incontinence     Past Surgical History:  Procedure Laterality Date  . ABDOMINAL HYSTERECTOMY  1976   total  . APPENDECTOMY    . BREAST EXCISIONAL BIOPSY Left 1987   benign    There were no vitals filed for this visit.  Subjective Assessment - 09/27/18 1313    Subjective  Knees are not bad. Knees felt stiff going down steps, not much of the pain. Feels like she is making progress in general.   2.5 to 3/10 both knee pain at most for the past 7 days.  Feels like she is getting better.     Pertinent History  B knee pain since a year or more. Walking up the steps bothers her. Walking around regularly is no pain. Pt lives in a 2 story home, first floor set up. 14 steps to get to second floor, L banister rail starting a third of the way up.  No steps to enter front and back door.   Gradual onset of B knee pain. Has been to Dr. Marry Guan who did x-rays who said that there is still cartilage in both knees. Was diagnosed with osteoarthritis.  [place]. Walking up hills bothers her knees.  Pt also walks about 20 minutes a day walking her dog.  R knee feels fragile when she starts with it to step up a step therefore she starts with her L, then she can step up with her R LE. Also goes to a chiropracter for adjustments.  Pt also has 5 step to go down to her meditation room, R rail assist in her home.  Has not yet used ice for her knees.   Pt adds that she has B foot pronation and plantar arch pain in which her orthotics that she wears helps her a lot.     Patient Stated Goals  Greater mobility, walk longer.     Currently in Pain?  No/denies    Pain Score  0-No pain    Pain Onset  More than a month ago  [facility] PT Assessment - 09/27/18 1342      Strength   Right Hip Extension  4+/5   seated manually resisted hip extension   Right Hip ABduction  5/5   seated manually resisted clamshell   Left Hip Extension  4+/5   seated manually resisted hip extension   Left Hip ABduction  5/5   seated manually resisted clamshell                          PT Education - 09/27/18 1505    Education Details  ther-ex    Person(s) Educated  Patient    Methods  Explanation;Demonstration;Tactile cues;Verbal cues    Comprehension  Returned demonstration;Verbalized understanding      Objective  No latex band allergies Blood pressure is controlled per pt.   B Medial knee pain, with swelling R > L   MedbridgeAccess Code: [email or web address]   Aortic aneurism osteopenia  Limited L ankle DF AROM ( to neutral, stiff end feel)  Manual therapy  Supine R tibia IR grade 1-2 for pain control   Supine L tibia IR grade 1-2 for pain control   Supine STM R and L lateral hamstrings to decrease  tension and decrease ER of tibia  Also to R and L IT band to decrease ER of tibia     Therapeutic exercise    Seated manually resisted hip extension and clamshell isometrics 1x each way for each LE  Improved exercise technique, movement at target joints, use of target muscles after min verbal, visual, tactile cues.   Pt demonstrates improved B hip abduction and extension strength, decreased B knee pain, decreased heel pain, and slight decreased difficulty with stair negotiation since initial evaluation. Pt making progress with PT towards goals. B knee pain seems to improve with manual therapy to help with pain control as well as decreasing ER of B tibia as well as treatment to promote glute strength and femoral control. Pt still demonstrates B knee pain, glute weakness, and difficulty performing functional tasks such as stair negotiation and stepping up a curb and would benefit from continued skilled physical therapy services to address the aforementioned deficits.            PT Short Term Goals - 09/20/18 1905      PT SHORT TERM GOAL #1   Title  Patient will be independent with her HEP to decrease pain, improve strength and function.     Time  3    Period  Weeks    Status  On-going    Target Date  10/11/18        PT Long Term Goals - 09/27/18 1506      PT LONG TERM GOAL #1   Title  Patient will have a decrease in B knee pain to 2/10 or less at worst to decrease difficulty negotiating stairs or climbing up hills.     Baseline  R knee 5/10 , L knee 4.5/10 at worst for the past 3 months (07/16/2018); 6/10 R knee pain at most, 4.5/10 L knee pain at most for the past 7 days (09/20/2018); 3/10 B knee pain at worst for the past 7 days (09/27/2018)    Time  6    Period  Weeks    Status  Partially Met    Target Date  11/01/18      PT LONG TERM GOAL #2   Title  Patient will be able to ascend and descend 4  regular steps with bilateral UE assist at least 2 x with minimal to no  complain of B knee pain.     Baseline  Increased B knee pain with ascending and descending 4 regular steps with B UE assist (07/16/2018). Still has pain but feels slight improvement (09/20/2018)    Time  6    Period  Weeks    Status  On-going    Target Date  11/01/18      PT LONG TERM GOAL #3   Title  Patient will improve B hip extension and abduction strengh by at least 1/2 MMT to promote ability to negotiate stairs and climb hills with less pain.     Time  6    Period  Weeks    Status  Achieved    Target Date  11/01/18      PT LONG TERM GOAL #4   Title  Patient will improve her 6 minute walk distance by at least 100 ft to promote mobility.     Baseline  1045 ft with L lateral hip pain/discomfort (07/16/2018); 1082 ft (09/20/2018)    Time  6    Period  Weeks    Status  Partially Met    Target Date  11/01/18            Plan - 09/27/18 1307    Clinical Impression Statement  Pt demonstrates improved B hip abduction and extension strength, decreased B knee pain, decreased heel pain, and slight decreased difficulty with stair negotiation since initial evaluation. Pt making progress with PT towards goals. B knee pain seems to improve with manual therapy to help with pain control as well as decreasing ER of B tibia as well as treatment to promote glute strength and femoral control. Pt still demonstrates B knee pain, glute weakness, and difficulty performing functional tasks such as stair negotiation and stepping up a curb and would benefit from continued skilled physical therapy services to address the aforementioned deficits.     History and Personal Factors relevant to plan of care:  B knee pain, hip weakness, decreased femoral control, aortic aneurism, B knee pain with stair negotiation B heel pain    Clinical Presentation  Stable    Clinical Presentation due to:  decreasing B knee pain, decreased heel pain    Clinical Decision Making  Low    Rehab Potential  Fair    Clinical Impairments  Affecting Rehab Potential  (-) age, chronicity of knee pain, hip weakenss. (+) motivated    PT Frequency  2x / week    PT Duration  6 weeks    PT Treatment/Interventions  Aquatic Therapy;Electrical Stimulation;Iontophoresis 36m/ml Dexamethasone;Gait training;Stair training;Therapeutic activities;Therapeutic exercise;Balance training;Neuromuscular re-education;Patient/family education;Manual techniques;Dry needling    PT Next Visit Plan  hip strengthening, femoral control, manual techniques, modalities PRN    Consulted and Agree with Plan of Care  Patient       Patient will benefit from skilled therapeutic intervention in order to improve the following deficits and impairments:  Pain, Postural dysfunction, Improper body mechanics, Decreased strength, Decreased range of motion  Visit Diagnosis: Chronic pain of right knee  Chronic pain of left knee  Muscle weakness (generalized)     Problem List Patient Active Problem List   Diagnosis Date Noted  . Arthritis of knee, degenerative 05/22/2018  . Cataract, right eye 05/17/2018  . Aortic stenosis, mild 02/22/2018  . Obesity (BMI 30.0-34.9) 01/18/2018  . Medication monitoring encounter 12/30/2015  . Productive cough 11/02/2015  . Skin  lesion 08/04/2015  . 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 Hip pain, chronic 04/02/2015  . Hearing loss of both ears 04/02/2015  . Osteopenia of the elderly 04/02/2015  . Hormone replacement therapy (postmenopausal) 04/02/2015  . Aortic aneurysm without rupture ([place]) 04/02/2015  . Situational anxiety 04/02/2015  . Dermatitis 04/02/2015  . Hyperlipidemia LDL goal <70 11/18/2014  . Incomplete bladder emptying 06/29/2014  . Mixed stress and urge urinary incontinence 06/29/2014  . Urge incontinence of urine 06/29/2014  . Hypertension goal BP (blood pressure) < 140/90 11/06/2013  . CAD (coronary artery disease) 11/06/2013   Thank you for your referral.  Joneen Boers PT, DPT   09/27/2018, 4:53 PM  [facility] [address], [place], [address] Phone: [phone]   Fax:  [phone]  Name: Angel [name] MRN: [id number] Date of Birth: [birth year]

## 2018-10-01 ENCOUNTER — Ambulatory Visit: Payer: Medicare Other

## 2018-10-01 DIAGNOSIS — G8929 Other chronic pain: Secondary | ICD-10-CM

## 2018-10-01 DIAGNOSIS — M6281 Muscle weakness (generalized): Secondary | ICD-10-CM

## 2018-10-01 DIAGNOSIS — M25562 Pain in left knee: Secondary | ICD-10-CM

## 2018-10-01 DIAGNOSIS — M25561 Pain in right knee: Principal | ICD-10-CM

## 2018-10-01 DIAGNOSIS — M79672 Pain in left foot: Secondary | ICD-10-CM | POA: Diagnosis not present

## 2018-10-01 DIAGNOSIS — M79671 Pain in right foot: Secondary | ICD-10-CM | POA: Diagnosis not present

## 2018-10-01 NOTE — Patient Instructions (Addendum)
MedbridgeAccess Code: [email or web address]   Seated hip ER  2-3x10 with 5 second holds each LE  Seated hip adduction squeeze with ball  10x3 with 5 second holds

## 2018-10-01 NOTE — Therapy (Signed)
[facility] [address] [place], [address] Phone: [phone]   Fax:  [phone]  Physical Therapy Treatment  Patient Details  Name: Angel [name] MRN: [id number] Date of Birth: 1931-02-10 Referring Provider (PT): Enid Derry, MD   Encounter Date: 10/01/2018  PT End of Session - 10/01/18 1354    Visit Number  11    Number of Visits  25    Date for PT Re-Evaluation  11/01/18    Authorization Type  1    Authorization Time Period  of 10 progress report    PT Start Time  1354   pt used bathroom prior to start of session   PT Stop Time  1427    PT Time Calculation (min)  33 min    Activity Tolerance  Patient tolerated treatment well    Behavior During Therapy  [facility] for tasks assessed/performed       Past Medical History:  Diagnosis Date  . Anxiety   . Aortic aneurysm without rupture ([place])   . CAD (coronary artery disease)   . Chronic hip pain   . Endometriosis   . Hearing loss   . HTN, goal below 150/90   . Hyperlipidemia LDL goal <70 11/18/2014  . Osteopenia of the elderly   . Urge and stress incontinence     Past Surgical History:  Procedure Laterality Date  . ABDOMINAL HYSTERECTOMY  1976   total  . APPENDECTOMY    . BREAST EXCISIONAL BIOPSY Left 1987   benign    There were no vitals filed for this visit.  Subjective Assessment - 10/01/18 1354    Subjective  3/10 B knee pain currently.     Pertinent History  B knee pain since a year or more. Walking up the steps bothers her. Walking around regularly is no pain. Pt lives in a 2 story home, first floor set up. 14 steps to get to second floor, L banister rail starting a third of the way up.  No steps to enter front and back door.  Gradual onset of B knee pain. Has been to Dr. Marry Guan who did x-rays who said that there is still cartilage in both knees. Was diagnosed with osteoarthritis.  [place]. Walking up hills bothers her knees.  Pt also walks about 20 minutes a day walking her dog.  R knee feels fragile when she starts with it to step up a step therefore she starts with her L, then she can step up with her R LE. Also goes to a chiropracter for adjustments.  Pt also has 5 step to go down to her meditation room, R rail assist in her home.  Has not yet used ice for her knees.   Pt adds that she has B foot pronation and plantar arch pain in which her orthotics that she wears helps her a lot.     Patient Stated Goals  Greater mobility, walk longer.     Currently in Pain?  Yes    Pain Score  3     Pain Onset  More than a month ago                               PT Education - 10/01/18 1357    Education Details  ther-ex, HEP    Person(s) Educated  Patient    Methods  Explanation;Demonstration;Tactile cues;Verbal cues;Handout    Comprehension  Returned demonstration;Verbalized understanding        Objective  No latex band allergies Blood pressure is controlled per pt.   B Medial knee pain, with swelling R > L   MedbridgeAccess Code: [email or web address]   Aortic aneurism osteopenia  Limited L ankle DF AROM ( to neutral, stiff end feel)   Therapeutic exercise  Seated hip ER   R 10x5 seconds  L 10x5 seconds    Seated hip adduction ball and glute max squeeze 10x5 seconds for 2 sets  Reviewed HEP. Pt demonstrated and verbalized understanding. Handout provided.   Running man with one UE assist  R LE 10x3  L LE 10x3   Standing hip abduction with B UE assist  2 lbs ankle weight  R 10x3  L 10x3  No B knee pain after aforementioned exercises  Standing LE leg press with double blue band with B UE assist  L LE 10x3  R LE 10x3  Improved exercise technique, movement at target joints, use of target muscles after min to mod verbal, visual, tactile cues.   Good muscle use felt with exercises. No B knee pain after session.     Continued working on glute med and max strengthening to promote proper knee position during gait. No B knee pain after performing exercises. Good muscle use felt with exercises. Pt will benefit from continued skilled physical therapy services to decrease pain, improve strength and function.               PT Short Term Goals - 09/20/18 1905      PT SHORT TERM GOAL #1   Title  Patient will be independent with her HEP to decrease pain, improve strength and function.     Time  3    Period  Weeks    Status  On-going    Target Date  10/11/18        PT Long Term Goals - 09/27/18 1506      PT LONG TERM GOAL #1   Title  Patient will have a decrease in B knee pain to 2/10 or less at worst to decrease difficulty negotiating stairs or climbing up hills.     Baseline  R knee 5/10 , L knee 4.5/10 at worst for the past 3 months (07/16/2018); 6/10 R knee pain at most, 4.5/10 L knee pain at most for the past 7 days (09/20/2018); 3/10 B knee pain at worst for the past 7 days (09/27/2018)    Time  6    Period  Weeks    Status  Partially Met    Target Date  11/01/18      PT LONG TERM GOAL #2   Title  Patient will be able to ascend and descend 4 regular steps with bilateral UE assist at least 2 x with minimal to no complain of B knee pain.     Baseline  Increased B knee pain with ascending and descending 4 regular steps with B UE assist (07/16/2018). Still has pain but feels slight improvement (09/20/2018)    Time  6    Period  Weeks    Status  On-going    Target Date  11/01/18      PT LONG TERM GOAL #3   Title  Patient will improve B hip extension and abduction strengh by at least 1/2 MMT to promote ability to negotiate stairs and climb hills with less pain.  Time  6    Period  Weeks    Status  Achieved    Target Date  11/01/18      PT LONG TERM GOAL #4   Title  Patient will improve her 6 minute walk distance by at least 100 ft to promote mobility.     Baseline  1045 ft with L  lateral hip pain/discomfort (07/16/2018); 1082 ft (09/20/2018)    Time  6    Period  Weeks    Status  Partially Met    Target Date  11/01/18            Plan - 10/01/18 1351    Clinical Impression Statement  Continued working on glute med and max strengthening to promote proper knee position during gait. No B knee pain after performing exercises. Good muscle use felt with exercises. Pt will benefit from continued skilled physical therapy services to decrease pain, improve strength and function.     Rehab Potential  Fair    Clinical Impairments Affecting Rehab Potential  (-) age, chronicity of knee pain, hip weakenss. (+) motivated    PT Frequency  2x / week    PT Duration  6 weeks    PT Treatment/Interventions  Aquatic Therapy;Electrical Stimulation;Iontophoresis 23m/ml Dexamethasone;Gait training;Stair training;Therapeutic activities;Therapeutic exercise;Balance training;Neuromuscular re-education;Patient/family education;Manual techniques;Dry needling    PT Next Visit Plan  hip strengthening, femoral control, manual techniques, modalities PRN    Consulted and Agree with Plan of Care  Patient       Patient will benefit from skilled therapeutic intervention in order to improve the following deficits and impairments:  Pain, Postural dysfunction, Improper body mechanics, Decreased strength, Decreased range of motion  Visit Diagnosis: Chronic pain of right knee  Chronic pain of left knee  Muscle weakness (generalized)     Problem List Patient Active Problem List   Diagnosis Date Noted  . Arthritis of knee, degenerative 05/22/2018  . Cataract, right eye 05/17/2018  . Aortic stenosis, mild 02/22/2018  . Obesity (BMI 30.0-34.9) 01/18/2018  . Medication monitoring encounter 12/30/2015  . Productive cough 11/02/2015  . Skin lesion 08/04/2015  . 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 Hip pain, chronic 04/02/2015  . Hearing loss of both ears 04/02/2015  . Osteopenia of the elderly  04/02/2015  . Hormone replacement therapy (postmenopausal) 04/02/2015  . Aortic aneurysm without rupture ([place] 04/02/2015  . Situational anxiety 04/02/2015  . Dermatitis 04/02/2015  . Hyperlipidemia LDL goal <70 11/18/2014  . Incomplete bladder emptying 06/29/2014  . Mixed stress and urge urinary incontinence 06/29/2014  . Urge incontinence of urine 06/29/2014  . Hypertension goal BP (blood pressure) < 140/90 11/06/2013  . CAD (coronary artery disease) 11/06/2013    MJoneen BoersPT, DPT   10/01/2018, 2:30 PM  [facility] [address] [place] [address]Phone: [phone]  Fax:  [phone] Name: Angel UPHAMMRN: [id number] of Birth: 412-14-1932

## 2018-10-02 ENCOUNTER — Ambulatory Visit (INDEPENDENT_AMBULATORY_CARE_PROVIDER_SITE_OTHER): Payer: Medicare Other | Admitting: Urology

## 2018-10-02 ENCOUNTER — Ambulatory Visit: Payer: Medicare Other

## 2018-10-02 DIAGNOSIS — N3946 Mixed incontinence: Secondary | ICD-10-CM

## 2018-10-02 NOTE — Progress Notes (Signed)
PTNS  Session # 2  Health & Social Factors: same Caffeine: 0 Alcohol: 0 Daytime voids #per day: 7 Night-time voids #per night: 2 Urgency: mild Incontinence Episodes #per day: 0 Ankle used: Left Treatment Setting: 7 Feeling/ Response: sensory Comments: Patient tolerated well.  Preformed By: Shawnie Dapper, CMA  Follow Up: as scheduled

## 2018-10-03 ENCOUNTER — Ambulatory Visit: Payer: Medicare Other

## 2018-10-03 DIAGNOSIS — M25561 Pain in right knee: Secondary | ICD-10-CM | POA: Diagnosis not present

## 2018-10-03 DIAGNOSIS — G8929 Other chronic pain: Secondary | ICD-10-CM | POA: Diagnosis not present

## 2018-10-03 DIAGNOSIS — M79672 Pain in left foot: Secondary | ICD-10-CM | POA: Diagnosis not present

## 2018-10-03 DIAGNOSIS — M25562 Pain in left knee: Secondary | ICD-10-CM | POA: Diagnosis not present

## 2018-10-03 DIAGNOSIS — M6281 Muscle weakness (generalized): Secondary | ICD-10-CM

## 2018-10-03 DIAGNOSIS — R262 Difficulty in walking, not elsewhere classified: Secondary | ICD-10-CM

## 2018-10-03 DIAGNOSIS — M79671 Pain in right foot: Secondary | ICD-10-CM

## 2018-10-03 NOTE — Patient Instructions (Addendum)
  MedbridgeAccess Code: [email or web address]   Gastroc stretch as stair step with L UE assist   30 seconds x 3     Standing great toe stretch at wall   R 30 seconds x 2  L 30 seconds x 2  No B plantar foot pain afterwards. Reviewed and given as part or her HEP. Pt demonstrated and verbalized understanding.

## 2018-10-03 NOTE — Therapy (Signed)
[facility] [address] [place], [address] Phone: [phone]   Fax:  [phone]  Physical Therapy Treatment  Patient Details  Name: Angel [name] MRN: [id number] Date of Birth: 04-25-1931 Referring Provider (PT): Enid Derry, MD   Encounter Date: 10/03/2018  PT End of Session - 10/03/18 1036    Visit Number  12    Number of Visits  25    Date for PT Re-Evaluation  11/01/18    Authorization Type  2    Authorization Time Period  of 10 progress report    PT Start Time  1036    PT Stop Time  1112    PT Time Calculation (min)  36 min    Activity Tolerance  Patient tolerated treatment well    Behavior During Therapy  [facility] for tasks assessed/performed       Past Medical History:  Diagnosis Date  . Anxiety   . Aortic aneurysm without rupture ([place])   . CAD (coronary artery disease)   . Chronic hip pain   . Endometriosis   . Hearing loss   . HTN, goal below 150/90   . Hyperlipidemia LDL goal <70 11/18/2014  . Osteopenia of the elderly   . Urge and stress incontinence     Past Surgical History:  Procedure Laterality Date  . ABDOMINAL HYSTERECTOMY  1976   total  . APPENDECTOMY    . BREAST EXCISIONAL BIOPSY Left 1987   benign    There were no vitals filed for this visit.  Subjective Assessment - 10/03/18 1037    Subjective  Pt states that her knees are doing better. No B knee pain when walking currently. So much improvement that she is not aware of it (knee pain).  The arches in both feet bother her. Has flat feet. Has not had any foot or ankle surgery.  5/10 B foot arches when walking.     Pertinent History  B knee pain since a year or more. Walking up the steps bothers her. Walking around regularly is no pain. Pt lives in a 2 story home, first floor set up. 14 steps to get to second floor, L banister rail starting a third of the way up.  No steps to enter front and back door.  Gradual onset of B  knee pain. Has been to Dr. Marry Guan who did x-rays who said that there is still cartilage in both knees. Was diagnosed with osteoarthritis.  [place]. Walking up hills bothers her knees.  Pt also walks about 20 minutes a day walking her dog.  R knee feels fragile when she starts with it to step up a step therefore she starts with her L, then she can step up with her R LE. Also goes to a chiropracter for adjustments.  Pt also has 5 step to go down to her meditation room, R rail assist in her home.  Has not yet used ice for her knees.   Pt adds that she has B foot pronation and plantar arch pain in which her orthotics that she wears helps her a lot.     Patient Stated Goals  Greater mobility, walk longer.     Currently in Pain?  Yes   0/10 B knees, 5/10 B plantar arches   Pain Score  5     Pain Onset  More than a month ago  PT Education - 10/03/18 1044    Education Details  ther-ex, HEP    Person(s) Educated  Patient    Methods  Explanation;Demonstration;Tactile cues;Verbal cues;Handout    Comprehension  Returned demonstration;Verbalized understanding        Objective  No latex band allergies Blood pressure is controlled per pt.   B Medial knee pain, with swelling R > L   MedbridgeAccess Code: [email or web address]   Aortic aneurism osteopenia  Limited L ankle DF AROM ( to neutral, stiff end feel)    Therapeutic exercise  Time taken for pt restroom break  Seated hip ER              R 10x5 seconds             L 10x5 seconds   standing ankle DF/PF on rockerboard with B UE assist 2 min  Helped with plantar foot pain per pt.    Wall gastroc stretch 30 seconds each LE. Non-painful L knee crepitus   Gastroc stretch as stair step with L UE assist   30 seconds x 3  Reviewed and given as part of HEP. Pt demonstrated and verbalized understanding. Handout  provided.  Running man with one UE assist             R LE 10x3             L LE 10x3   Seated hip adduction ball and glute max squeeze 10x5 seconds for 2 sets  Standing great toe stretch at wall   R 30 seconds x 2  L 30 seconds x 2  No B plantar foot pain afterwards. Reviewed and given as part or her HEP. Pt demonstrated and verbalized understanding.    Improved exercise technique, movement at target joints, use of target muscles after min to mod verbal, visual, tactile cues.   Pt tolerated session well without aggravation of symptoms. No B plantar foot pain after session. Good comfortable gastroc and plantar foot stretch felt with exercises.      Pt making very good progress with decreasing B knee pain based on subjective reports. Worked on improving gastroc muscle and plantar tissue mobility to help with B plantar foot pain. No foot pain after session with gait. Still has difficulty with stair negotiation due to knee pain but a little better based on pt reports. Continued working on improving glute muscle strengthening to promote femoral control and proper mechanics at the knee when performing closed chain activities. Pt tolerated session well without aggravation of symptoms. Pt will benefit from continued skilled physical therapy services to decrease pain, improve strength and function.           PT Short Term Goals - 09/20/18 1905      PT SHORT TERM GOAL #1   Title  Patient will be independent with her HEP to decrease pain, improve strength and function.     Time  3    Period  Weeks    Status  On-going    Target Date  10/11/18        PT Long Term Goals - 09/27/18 1506      PT LONG TERM GOAL #1   Title  Patient will have a decrease in B knee pain to 2/10 or less at worst to decrease difficulty negotiating stairs or climbing up hills.     Baseline  R knee 5/10 , L knee 4.5/10 at worst for the past 3 months (07/16/2018); 6/10 R knee pain at most,  4.5/10 L knee pain  at most for the past 7 days (09/20/2018); 3/10 B knee pain at worst for the past 7 days (09/27/2018)    Time  6    Period  Weeks    Status  Partially Met    Target Date  11/01/18      PT LONG TERM GOAL #2   Title  Patient will be able to ascend and descend 4 regular steps with bilateral UE assist at least 2 x with minimal to no complain of B knee pain.     Baseline  Increased B knee pain with ascending and descending 4 regular steps with B UE assist (07/16/2018). Still has pain but feels slight improvement (09/20/2018)    Time  6    Period  Weeks    Status  On-going    Target Date  11/01/18      PT LONG TERM GOAL #3   Title  Patient will improve B hip extension and abduction strengh by at least 1/2 MMT to promote ability to negotiate stairs and climb hills with less pain.     Time  6    Period  Weeks    Status  Achieved    Target Date  11/01/18      PT LONG TERM GOAL #4   Title  Patient will improve her 6 minute walk distance by at least 100 ft to promote mobility.     Baseline  1045 ft with L lateral hip pain/discomfort (07/16/2018); 1082 ft (09/20/2018)    Time  6    Period  Weeks    Status  Partially Met    Target Date  11/01/18            Plan - 10/03/18 1044    Clinical Impression Statement  Pt making very good progress with decreasing B knee pain based on subjective reports. Worked on improving gastroc muscle and plantar tissue mobility to help with B plantar foot pain. No foot pain after session with gait. Still has difficulty with stair negotiation due to knee pain but a little better based on pt reports. Continued working on improving glute muscle strengthening to promote femoral control and proper mechanics at the knee when performing closed chain activities. Pt tolerated session well without aggravation of symptoms. Pt will benefit from continued skilled physical therapy services to decrease pain, improve strength and function.     Rehab Potential  Fair    Clinical  Impairments Affecting Rehab Potential  (-) age, chronicity of knee pain, hip weakenss. (+) motivated    PT Frequency  2x / week    PT Duration  6 weeks    PT Treatment/Interventions  Aquatic Therapy;Electrical Stimulation;Iontophoresis 94m/ml Dexamethasone;Gait training;Stair training;Therapeutic activities;Therapeutic exercise;Balance training;Neuromuscular re-education;Patient/family education;Manual techniques;Dry needling    PT Next Visit Plan  hip strengthening, femoral control, manual techniques, modalities PRN    Consulted and Agree with Plan of Care  Patient       Patient will benefit from skilled therapeutic intervention in order to improve the following deficits and impairments:  Pain, Postural dysfunction, Improper body mechanics, Decreased strength, Decreased range of motion  Visit Diagnosis: Chronic pain of right knee  Chronic pain of left knee  Muscle weakness (generalized)  Pain in left foot  Pain in right foot  Difficulty in walking, not elsewhere classified     Problem List Patient Active Problem List   Diagnosis Date Noted  . Arthritis of knee, degenerative 05/22/2018  . Cataract, right eye  05/17/2018  . Aortic stenosis, mild 02/22/2018  . Obesity (BMI 30.0-34.9) 01/18/2018  . Medication monitoring encounter 12/30/2015  . Productive cough 11/02/2015  . Skin lesion 08/04/2015  . 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 Hip pain, chronic 04/02/2015  . Hearing loss of both ears 04/02/2015  . Osteopenia of the elderly 04/02/2015  . Hormone replacement therapy (postmenopausal) 04/02/2015  . Aortic aneurysm without rupture ([place]) 04/02/2015  . Situational anxiety 04/02/2015  . Dermatitis 04/02/2015  . Hyperlipidemia LDL goal <70 11/18/2014  . Incomplete bladder emptying 06/29/2014  . Mixed stress and urge urinary incontinence 06/29/2014  . Urge incontinence of urine 06/29/2014  . Hypertension goal BP (blood pressure) < 140/90 11/06/2013  . CAD (coronary artery disease)  11/06/2013    Joneen Boers PT, DPT   10/03/2018, 3:51 PM  [facility] [address] [place], [address] Phone: [phone]   Fax:  [phone]  Name: Angel [name] MRN: [id number] Date of Birth: 1930/08/18

## 2018-10-04 ENCOUNTER — Ambulatory Visit: Payer: Medicare Other

## 2018-10-09 ENCOUNTER — Ambulatory Visit (INDEPENDENT_AMBULATORY_CARE_PROVIDER_SITE_OTHER): Payer: Medicare Other

## 2018-10-09 DIAGNOSIS — N3946 Mixed incontinence: Secondary | ICD-10-CM

## 2018-10-09 NOTE — Progress Notes (Signed)
PTNS  Session # 3  Health & Social Factors: same Caffeine: 0 Alcohol: 0 Daytime voids #per day: 3-4 Night-time voids #per night: 1-2 Urgency: mild Incontinence Episodes #per day: 0 Ankle used: right Treatment Setting: 6 Feeling/ Response: sensory up leg Comments: Patient tolerated well. States she notices more frequency during high stress times. Patient is currently very stressed and wants stress factor noted in reference to voids.  Preformed By: Shawnie Dapper, CMA  Follow Up: as scheduled

## 2018-10-11 ENCOUNTER — Ambulatory Visit: Payer: Medicare Other | Attending: Family Medicine

## 2018-10-11 DIAGNOSIS — M79671 Pain in right foot: Secondary | ICD-10-CM | POA: Insufficient documentation

## 2018-10-11 DIAGNOSIS — M25562 Pain in left knee: Secondary | ICD-10-CM | POA: Insufficient documentation

## 2018-10-11 DIAGNOSIS — R262 Difficulty in walking, not elsewhere classified: Secondary | ICD-10-CM | POA: Diagnosis not present

## 2018-10-11 DIAGNOSIS — G8929 Other chronic pain: Secondary | ICD-10-CM | POA: Insufficient documentation

## 2018-10-11 DIAGNOSIS — M6281 Muscle weakness (generalized): Secondary | ICD-10-CM | POA: Insufficient documentation

## 2018-10-11 DIAGNOSIS — M79672 Pain in left foot: Secondary | ICD-10-CM | POA: Diagnosis not present

## 2018-10-11 DIAGNOSIS — M25561 Pain in right knee: Secondary | ICD-10-CM | POA: Insufficient documentation

## 2018-10-11 NOTE — Therapy (Signed)
[facility] [address], [place], [address] Phone: [phone]   Fax:  [phone]  Physical Therapy Treatment  Patient Details  Name: Angel [name] MRN: [id number] Date of Birth: 02-17-1931 Referring Provider (PT): Enid Derry, MD   Encounter Date: 10/11/2018  PT End of Session - 10/11/18 1306    Visit Number  13    Number of Visits  25    Date for PT Re-Evaluation  11/01/18    Authorization Type  3    Authorization Time Period  of 10 progress report    PT Start Time  1306    PT Stop Time  1346    PT Time Calculation (min)  40 min    Activity Tolerance  Patient tolerated treatment well    Behavior During Therapy  [facility] for tasks assessed/performed       Past Medical History:  Diagnosis Date  . Anxiety   . Aortic aneurysm without rupture ([place])   . CAD (coronary artery disease)   . Chronic hip pain   . Endometriosis   . Hearing loss   . HTN, goal below 150/90   . Hyperlipidemia LDL goal <70 11/18/2014  . Osteopenia of the elderly   . Urge and stress incontinence     Past Surgical History:  Procedure Laterality Date  . ABDOMINAL HYSTERECTOMY  1976   total  . APPENDECTOMY    . BREAST EXCISIONAL BIOPSY Left 1987   benign    There were no vitals filed for this visit.  Subjective Assessment - 10/11/18 1306    Subjective  Knees are doing better. Has improvement with PT. Feet feel better after treatment/manual therapy to her knee. No knee and foot pain B currently while walking.     Pertinent History  B knee pain since a year or more. Walking up the steps bothers her. Walking around regularly is no pain. Pt lives in a 2 story home, first floor set up. 14 steps to get to second floor, L banister rail starting a third of the way up.  No steps to enter front and back door.  Gradual onset of B knee pain. Has been to Dr. Marry Guan who did x-rays who said that there is still cartilage in both knees. Was  diagnosed with osteoarthritis.  [place]. Walking up hills bothers her knees.  Pt also walks about 20 minutes a day walking her dog.  R knee feels fragile when she starts with it to step up a step therefore she starts with her L, then she can step up with her R LE. Also goes to a chiropracter for adjustments.  Pt also has 5 step to go down to her meditation room, R rail assist in her home.  Has not yet used ice for her knees.   Pt adds that she has B foot pronation and plantar arch pain in which her orthotics that she wears helps her a lot.     Patient Stated Goals  Greater mobility, walk longer.     Currently in Pain?  No/denies    Pain Score  0-No pain    Pain Onset  More than a month ago                               PT Education - 10/11/18 1309  Education Details  ther-ex    Person(s) Educated  Patient    Methods  Explanation;Demonstration;Tactile cues;Verbal cues    Comprehension  Returned demonstration;Verbalized understanding       Objective  No latex band allergies Blood pressure is controlled per pt.   B Medial knee pain, with swelling R > L   MedbridgeAccess Code: [email or web address]   Aortic aneurism osteopenia  Limited L ankle DF AROM ( to neutral, stiff end feel)    Therapeutic exercise   Seated hip ER  R 10x5 seconds L 10x5 seconds   standing ankle DF/PF on rockerboard with B UE assist 2 min             Helped with plantar foot pain per pt.    Standing great toe stretch at wall              R 30 seconds x 2             L 30 seconds x 2   Gastroc stretch as stair step with L UE assist              30 seconds x 3             Single leg mini squat with contralateral LE abduction with that foot on furniture slider, B UE assist   R 10x2  L 10x2  Forward step up onto 4 inch step with one UE assist, emphasis on femoral control    R 10x2. (no pain at second set after manual therapy)   L 10x2   R patellar pain which disappeared after manual therapy to decrease lateral hamstrings and vastus lateralis and IT band muscle tension   Seated hip adduction ball and glute max squeeze 10x5 seconds for 2 sets  Improved exercise technique, movement at target joints, use of target muscles after mod verbal, visual, tactile cues.    Manual therapy  Seated STM R vastus lateralis, lateral hamstrings, IT band   No R knee pain with step ups onto 4 inch step aftewards    Improved exercise technique, movement at target joints, use of target muscles after min to mod verbal, visual, tactile cues.   Response to treatment Decreased R knee pain with step ups after manual therapy. Good muscle stretch and muscle use felt with exercises.   Clinical impression Continued working on improving gastroc and plantar tissue mobility to help decrease plantar foot pain. Continued working on glute strengthening and femoral control to help decrease knee pain with stair negotiation and with gait. Pt tolerated session well without aggravation of symptoms. Pt will benefit from skilled physical therapy services to decrease pain, improve strength and function.     PT Short Term Goals - 09/20/18 1905      PT SHORT TERM GOAL #1   Title  Patient will be independent with her HEP to decrease pain, improve strength and function.     Time  3    Period  Weeks    Status  On-going    Target Date  10/11/18        PT Long Term Goals - 09/27/18 1506      PT LONG TERM GOAL #1   Title  Patient will have a decrease in B knee pain to 2/10 or less at worst to decrease difficulty negotiating stairs or climbing up hills.     Baseline  R knee 5/10 , L knee 4.5/10 at worst for the past 3 months (07/16/2018); 6/10 R knee  pain at most, 4.5/10 L knee pain at most for the past 7 days (09/20/2018); 3/10 B knee pain at worst for the past 7 days (09/27/2018)    Time  6     Period  Weeks    Status  Partially Met    Target Date  11/01/18      PT LONG TERM GOAL #2   Title  Patient will be able to ascend and descend 4 regular steps with bilateral UE assist at least 2 x with minimal to no complain of B knee pain.     Baseline  Increased B knee pain with ascending and descending 4 regular steps with B UE assist (07/16/2018). Still has pain but feels slight improvement (09/20/2018)    Time  6    Period  Weeks    Status  On-going    Target Date  11/01/18      PT LONG TERM GOAL #3   Title  Patient will improve B hip extension and abduction strengh by at least 1/2 MMT to promote ability to negotiate stairs and climb hills with less pain.     Time  6    Period  Weeks    Status  Achieved    Target Date  11/01/18      PT LONG TERM GOAL #4   Title  Patient will improve her 6 minute walk distance by at least 100 ft to promote mobility.     Baseline  1045 ft with L lateral hip pain/discomfort (07/16/2018); 1082 ft (09/20/2018)    Time  6    Period  Weeks    Status  Partially Met    Target Date  11/01/18            Plan - 10/11/18 1309    Clinical Impression Statement  Continued working on improving gastroc and plantar tissue mobility to help decrease plantar foot pain. Continued working on glute strengthening and femoral control to help decrease knee pain with stair negotiation and with gait. Pt tolerated session well without aggravation of symptoms. Pt will benefit from skilled physical therapy services to decrease pain, improve strength and function.     Rehab Potential  Fair    Clinical Impairments Affecting Rehab Potential  (-) age, chronicity of knee pain, hip weakenss. (+) motivated    PT Frequency  2x / week    PT Duration  6 weeks    PT Treatment/Interventions  Aquatic Therapy;Electrical Stimulation;Iontophoresis 69m/ml Dexamethasone;Gait training;Stair training;Therapeutic activities;Therapeutic exercise;Balance training;Neuromuscular  re-education;Patient/family education;Manual techniques;Dry needling    PT Next Visit Plan  hip strengthening, femoral control, manual techniques, modalities PRN    Consulted and Agree with Plan of Care  Patient       Patient will benefit from skilled therapeutic intervention in order to improve the following deficits and impairments:  Pain, Postural dysfunction, Improper body mechanics, Decreased strength, Decreased range of motion  Visit Diagnosis: Chronic pain of right knee  Chronic pain of left knee  Muscle weakness (generalized)  Pain in left foot  Pain in right foot  Difficulty in walking, not elsewhere classified     Problem List Patient Active Problem List   Diagnosis Date Noted  . Arthritis of knee, degenerative 05/22/2018  . Cataract, right eye 05/17/2018  . Aortic stenosis, mild 02/22/2018  . Obesity (BMI 30.0-34.9) 01/18/2018  . Medication monitoring encounter 12/30/2015  . Productive cough 11/02/2015  . Skin lesion 08/04/2015  . 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 Hip pain, chronic 04/02/2015  .  Hearing loss of both ears 04/02/2015  . Osteopenia of the elderly 04/02/2015  . Hormone replacement therapy (postmenopausal) 04/02/2015  . Aortic aneurysm without rupture ([place]) 04/02/2015  . Situational anxiety 04/02/2015  . Dermatitis 04/02/2015  . Hyperlipidemia LDL goal <70 11/18/2014  . Incomplete bladder emptying 06/29/2014  . Mixed stress and urge urinary incontinence 06/29/2014  . Urge incontinence of urine 06/29/2014  . Hypertension goal BP (blood pressure) < 140/90 11/06/2013  . CAD (coronary artery disease) 11/06/2013    Joneen Boers PT, DPT   10/11/2018, 5:08 PM  [facility] [address], [place], [address] Phone: [phone]   Fax:  [phone]  Name: Angel [name] MRN: [id number] Date of Birth: 06-06-1931

## 2018-10-16 ENCOUNTER — Ambulatory Visit: Payer: Medicare Other

## 2018-10-16 ENCOUNTER — Other Ambulatory Visit: Payer: Self-pay

## 2018-10-16 ENCOUNTER — Ambulatory Visit (INDEPENDENT_AMBULATORY_CARE_PROVIDER_SITE_OTHER): Payer: Medicare Other | Admitting: Urology

## 2018-10-16 DIAGNOSIS — N3946 Mixed incontinence: Secondary | ICD-10-CM

## 2018-10-16 NOTE — Progress Notes (Signed)
PTNS  Session # 4  Health & Social Factors: same Caffeine: Avoids Alcohol: every now and then  Daytime voids #per day: 5-6 Night-time voids #per night: 2 Urgency: mild Incontinence Episodes #per day: 1 Ankle used: left Treatment Setting: 5 Feeling/ Response: sensory  Preformed By: Zara Council, PA-C   Follow Up: one week for 5th PTNS

## 2018-10-18 ENCOUNTER — Ambulatory Visit: Payer: Medicare Other

## 2018-10-18 ENCOUNTER — Other Ambulatory Visit: Payer: Self-pay

## 2018-10-18 DIAGNOSIS — M6281 Muscle weakness (generalized): Secondary | ICD-10-CM

## 2018-10-18 DIAGNOSIS — M25561 Pain in right knee: Principal | ICD-10-CM

## 2018-10-18 DIAGNOSIS — M79672 Pain in left foot: Secondary | ICD-10-CM | POA: Diagnosis not present

## 2018-10-18 DIAGNOSIS — M25562 Pain in left knee: Secondary | ICD-10-CM

## 2018-10-18 DIAGNOSIS — M79671 Pain in right foot: Secondary | ICD-10-CM

## 2018-10-18 DIAGNOSIS — G8929 Other chronic pain: Secondary | ICD-10-CM | POA: Diagnosis not present

## 2018-10-18 DIAGNOSIS — R262 Difficulty in walking, not elsewhere classified: Secondary | ICD-10-CM

## 2018-10-18 NOTE — Therapy (Signed)
[facility] [address], [place], [address] Phone: [phone]   Fax:  [phone]  Physical Therapy Treatment  Patient Details  Name: Angel [name] MRN: [id number] Date of Birth: 1930-08-13 Referring Provider (PT): Enid Derry, MD   Encounter Date: 10/18/2018  PT End of Session - 10/18/18 1337    Visit Number  14    Number of Visits  25    Date for PT Re-Evaluation  11/01/18    Authorization Type  4    Authorization Time Period  of 10 progress report    PT Start Time  5038    PT Stop Time  1424    PT Time Calculation (min)  47 min    Activity Tolerance  Patient tolerated treatment well    Behavior During Therapy  [facility] for tasks assessed/performed       Past Medical History:  Diagnosis Date  . Anxiety   . Aortic aneurysm without rupture ([place])   . CAD (coronary artery disease)   . Chronic hip pain   . Endometriosis   . Hearing loss   . HTN, goal below 150/90   . Hyperlipidemia LDL goal <70 11/18/2014  . Osteopenia of the elderly   . Urge and stress incontinence     Past Surgical History:  Procedure Laterality Date  . ABDOMINAL HYSTERECTOMY  1976   total  . APPENDECTOMY    . BREAST EXCISIONAL BIOPSY Left 1987   benign    There were no vitals filed for this visit.  Subjective Assessment - 10/18/18 1339    Subjective  Knees are ok today. No pain in her knees when walking. Feels R lateral foot pain when walking, 3.5/10.  The plantar foot pain is not problem.   Has not noticed her knees bothering her when walking her dog.      Pertinent History  B knee pain since a year or more. Walking up the steps bothers her. Walking around regularly is no pain. Pt lives in a 2 story home, first floor set up. 14 steps to get to second floor, L banister rail starting a third of the way up.  No steps to enter front and back door.  Gradual onset of B knee pain. Has been to Dr. Marry Guan who did x-rays who said that  there is still cartilage in both knees. Was diagnosed with osteoarthritis.  [place]. Walking up hills bothers her knees.  Pt also walks about 20 minutes a day walking her dog.  R knee feels fragile when she starts with it to step up a step therefore she starts with her L, then she can step up with her R LE. Also goes to a chiropracter for adjustments.  Pt also has 5 step to go down to her meditation room, R rail assist in her home.  Has not yet used ice for her knees.   Pt adds that she has B foot pronation and plantar arch pain in which her orthotics that she wears helps her a lot.     Patient Stated Goals  Greater mobility, walk longer.     Currently in Pain?  Yes    Pain Score  4    3.5/10 R lateral foot pain    Pain Onset  More than a month ago  PT Education - 10/18/18 1344    Education Details  ther-ex    Person(s) Educated  Patient    Methods  Explanation;Demonstration;Tactile cues;Verbal cues    Comprehension  Returned demonstration;Verbalized understanding      Objective  No latex band allergies Blood pressure is controlled per pt.  osteopenia  B Medial knee pain, with swelling R > L   MedbridgeAccess Code: [email or web address]   Aortic aneurism osteopenia  Limited L ankle DF AROM ( to neutral, stiff end feel)   Manual therapy  Seated STM R vastus lateralis, lateral hamstrings, IT band Seated gentle sustained medial pressure R patella     Therapeutic exercise  Standing B ankle DF/PF 3 min with B UE assist   No R lateral foot pain when walking   Seated hip ER  R 2 lbs 10x3 L 2 lbs 10x3     Seated hip adduction ball and glute max squeeze 10x5 seconds for 2 sets  Forward step up onto and off 4 inch step with one UE assist   R 10x, 3x, 3x, 5x  L 10x, 3x, 3x, 5x  Decreased R knee pain with step down with manual  therapy   Running man with one UE assist   R 10x2  L 10x2  Seated R knee flexion targeting the medial hamstrings green band 10x3 to promote better mechanics at the knee  Slight decreased R knee pain with stepping off 4 inch step.    Improved exercise technique, movement at target joints, use of target muscles aftermin tomod verbal, visual, tactile cues.  Response to treatment Decreased R knee pain with step ups after manual therapy. Good muscle use felt with exercises.    Clinical impression Pt demonstrates limited R ankle DF during stance phase. Worked on improving DF ROM through the rocker board exercise. Decreased R lateral foot pain during gait afterwards. Continued working on decreasing R lateral hamstrings, vastus lateralis, IT band mobility and promoting gentle medial glide to R patella to help decrease knee pain. Continued working on LE strength as well to promote ability to negotiate stairs with less knee pain. Pt will benefit from continued skilled physical therapy services to decrease pain, improve strength and function.     PT Short Term Goals - 09/20/18 1905      PT SHORT TERM GOAL #1   Title  Patient will be independent with her HEP to decrease pain, improve strength and function.     Time  3    Period  Weeks    Status  On-going    Target Date  10/11/18        PT Long Term Goals - 09/27/18 1506      PT LONG TERM GOAL #1   Title  Patient will have a decrease in B knee pain to 2/10 or less at worst to decrease difficulty negotiating stairs or climbing up hills.     Baseline  R knee 5/10 , L knee 4.5/10 at worst for the past 3 months (07/16/2018); 6/10 R knee pain at most, 4.5/10 L knee pain at most for the past 7 days (09/20/2018); 3/10 B knee pain at worst for the past 7 days (09/27/2018)    Time  6    Period  Weeks    Status  Partially Met    Target Date  11/01/18      PT LONG TERM GOAL #2   Title  Patient will be able to ascend and descend 4 regular steps  with  bilateral UE assist at least 2 x with minimal to no complain of B knee pain.     Baseline  Increased B knee pain with ascending and descending 4 regular steps with B UE assist (07/16/2018). Still has pain but feels slight improvement (09/20/2018)    Time  6    Period  Weeks    Status  On-going    Target Date  11/01/18      PT LONG TERM GOAL #3   Title  Patient will improve B hip extension and abduction strengh by at least 1/2 MMT to promote ability to negotiate stairs and climb hills with less pain.     Time  6    Period  Weeks    Status  Achieved    Target Date  11/01/18      PT LONG TERM GOAL #4   Title  Patient will improve her 6 minute walk distance by at least 100 ft to promote mobility.     Baseline  1045 ft with L lateral hip pain/discomfort (07/16/2018); 1082 ft (09/20/2018)    Time  6    Period  Weeks    Status  Partially Met    Target Date  11/01/18            Plan - 10/18/18 1401    Clinical Impression Statement  Pt demonstrates limited R ankle DF during stance phase. Worked on improving DF ROM through the rocker board exercise. Decreased R lateral foot pain during gait afterwards. Continued working on decreasing R lateral hamstrings, vastus lateralis, IT band mobility and promoting gentle medial glide to R patella to help decrease knee pain. Continued working on LE strength as well to promote ability to negotiate stairs with less knee pain. Pt will benefit from continued skilled physical therapy services to decrease pain, improve strength and function.     Rehab Potential  Fair    Clinical Impairments Affecting Rehab Potential  (-) age, chronicity of knee pain, hip weakenss. (+) motivated    PT Frequency  2x / week    PT Duration  6 weeks    PT Treatment/Interventions  Aquatic Therapy;Electrical Stimulation;Iontophoresis 44m/ml Dexamethasone;Gait training;Stair training;Therapeutic activities;Therapeutic exercise;Balance training;Neuromuscular  re-education;Patient/family education;Manual techniques;Dry needling    PT Next Visit Plan  hip strengthening, femoral control, manual techniques, modalities PRN    Consulted and Agree with Plan of Care  Patient       Patient will benefit from skilled therapeutic intervention in order to improve the following deficits and impairments:  Pain, Postural dysfunction, Improper body mechanics, Decreased strength, Decreased range of motion  Visit Diagnosis: Chronic pain of right knee  Chronic pain of left knee  Muscle weakness (generalized)  Pain in left foot  Pain in right foot  Difficulty in walking, not elsewhere classified     Problem List Patient Active Problem List   Diagnosis Date Noted  . Arthritis of knee, degenerative 05/22/2018  . Cataract, right eye 05/17/2018  . Aortic stenosis, mild 02/22/2018  . Obesity (BMI 30.0-34.9) 01/18/2018  . Medication monitoring encounter 12/30/2015  . Productive cough 11/02/2015  . Skin lesion 08/04/2015  . 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 Hip pain, chronic 04/02/2015  . Hearing loss of both ears 04/02/2015  . Osteopenia of the elderly 04/02/2015  . Hormone replacement therapy (postmenopausal) 04/02/2015  . [place] 04/02/2015  . Situational anxiety 04/02/2015  . Dermatitis 04/02/2015  . Hyperlipidemia LDL goal <70 11/18/2014  . Incomplete bladder emptying 06/29/2014  .  Mixed stress and urge urinary incontinence 06/29/2014  . Urge incontinence of urine 06/29/2014  . Hypertension goal BP (blood pressure) < 140/90 11/06/2013  . CAD (coronary artery disease) 11/06/2013    Joneen Boers PT, DPT   10/18/2018, 2:39 PM  [facility] [address], [place], [address] Phone: [phone]   Fax:  [phone]  Name: NYRA ANSPAUGH MRN: [id number] Date of Birth: 1930/11/18

## 2018-10-23 ENCOUNTER — Ambulatory Visit: Payer: Medicare Other

## 2018-10-25 ENCOUNTER — Ambulatory Visit: Payer: Medicare Other

## 2018-10-29 NOTE — Therapy (Signed)
[facility] [address] [place], [address] Phone: [phone]   Fax:  [phone]  Patient Details  Name: Angel [name] MRN: [id number] Date of Birth: May 31, 1931 Referring Provider:  No ref. provider found  Encounter Date: 10/29/2018    Called patient and informed patient about current clinic closure for a minimum of 2 weeks due to the corona virus outbreak. Pt was also informed that her next appointment is at the end of the 2 week period and to call the clinic prior to coming to make sure that the clinic is open. Pt verbalized understanding. Pt also expresses interest in telehealth or online physical therapy if one becomes available. Pt took a walk yesterday on uneven terrain with roots, knees were painful. No questions with HEP. Pt states that she misses coming to PT.    Joneen Boers PT, DPT   10/29/2018, 5:21 PM  [facility] [address] [place], [address] Phone: [phone]   Fax:  [phone]

## 2018-10-30 ENCOUNTER — Ambulatory Visit: Payer: Medicare Other

## 2018-11-01 ENCOUNTER — Ambulatory Visit: Payer: Medicare Other

## 2018-11-05 ENCOUNTER — Ambulatory Visit: Payer: Medicare Other

## 2018-11-06 ENCOUNTER — Ambulatory Visit: Payer: Medicare Other

## 2018-11-13 ENCOUNTER — Ambulatory Visit: Payer: Medicare Other

## 2018-11-15 ENCOUNTER — Telehealth: Payer: Self-pay

## 2018-11-15 NOTE — Telephone Encounter (Signed)
Left message for patient regarding need for AWV and available to complete via video chat. Requested patient to contact the office at [phone] or contact me directly at [phone] to schedule virtual visit.   Virtual Medicare Annual wellness visits scheduled tentatively only through May 2020 during COVID-19 pandemic.     Thank you!

## 2018-11-20 ENCOUNTER — Ambulatory Visit: Payer: Medicare Other

## 2018-11-20 DIAGNOSIS — M62830 Muscle spasm of back: Secondary | ICD-10-CM | POA: Diagnosis not present

## 2018-11-20 DIAGNOSIS — M9902 Segmental and somatic dysfunction of thoracic region: Secondary | ICD-10-CM | POA: Diagnosis not present

## 2018-11-20 DIAGNOSIS — M5416 Radiculopathy, lumbar region: Secondary | ICD-10-CM | POA: Diagnosis not present

## 2018-11-20 DIAGNOSIS — M5134 Other intervertebral disc degeneration, thoracic region: Secondary | ICD-10-CM | POA: Diagnosis not present

## 2018-11-22 DIAGNOSIS — M5134 Other intervertebral disc degeneration, thoracic region: Secondary | ICD-10-CM | POA: Diagnosis not present

## 2018-11-22 DIAGNOSIS — M9902 Segmental and somatic dysfunction of thoracic region: Secondary | ICD-10-CM | POA: Diagnosis not present

## 2018-11-22 DIAGNOSIS — M5416 Radiculopathy, lumbar region: Secondary | ICD-10-CM | POA: Diagnosis not present

## 2018-11-22 DIAGNOSIS — M62830 Muscle spasm of back: Secondary | ICD-10-CM | POA: Diagnosis not present

## 2018-11-24 DIAGNOSIS — M9902 Segmental and somatic dysfunction of thoracic region: Secondary | ICD-10-CM | POA: Diagnosis not present

## 2018-11-24 DIAGNOSIS — M5416 Radiculopathy, lumbar region: Secondary | ICD-10-CM | POA: Diagnosis not present

## 2018-11-24 DIAGNOSIS — M5134 Other intervertebral disc degeneration, thoracic region: Secondary | ICD-10-CM | POA: Diagnosis not present

## 2018-11-24 DIAGNOSIS — M62830 Muscle spasm of back: Secondary | ICD-10-CM | POA: Diagnosis not present

## 2018-11-26 DIAGNOSIS — M62830 Muscle spasm of back: Secondary | ICD-10-CM | POA: Diagnosis not present

## 2018-11-26 DIAGNOSIS — M5134 Other intervertebral disc degeneration, thoracic region: Secondary | ICD-10-CM | POA: Diagnosis not present

## 2018-11-26 DIAGNOSIS — M9902 Segmental and somatic dysfunction of thoracic region: Secondary | ICD-10-CM | POA: Diagnosis not present

## 2018-11-26 DIAGNOSIS — M5416 Radiculopathy, lumbar region: Secondary | ICD-10-CM | POA: Diagnosis not present

## 2018-11-26 NOTE — Therapy (Signed)
[facility] [address] [place], [address] Phone: [phone]   Fax:  [phone]  Patient Details  Name: Angel [name] MRN: [id number] Date of Birth: [birth year] Referring Provider:  No ref. provider found  Encounter Date: 11/26/2018   The patient has been contacted today in regards to telehealth services. The patient expressed an interest in participating in telehealth visits. Patient has been informed that an [facility] support representative will be reaching out to them to verify their insurance benefits and for scheduling when Medicare approves the service.       Joneen Boers PT, DPT   11/26/2018, 5:13 PM  [facility] [address], [place], [address] Phone: [phone]   Fax:  [phone]

## 2018-11-27 ENCOUNTER — Other Ambulatory Visit: Payer: Self-pay | Admitting: Family Medicine

## 2018-11-27 ENCOUNTER — Ambulatory Visit: Payer: Medicare Other

## 2018-11-27 NOTE — Telephone Encounter (Signed)
Please call patient  Remind her of the risk of breast cancer with exogenous estrogen; we've discussed this before and I want to remind her Ask if she has any lumps or bumps If yes --> order imaging and STOP estrogen If no breast lumps --> let her know she is overdue for a mammogram, please enter and encourage her to get that when safe to do so and they are scheduling screening mammograms again I'd love for her to STOP it altogether if she is willing, but will provider further taper to just half of a pill twice a week if she must continue; I'll plan to stop it after this next 3 month prescription

## 2018-11-28 DIAGNOSIS — M62830 Muscle spasm of back: Secondary | ICD-10-CM | POA: Diagnosis not present

## 2018-11-28 DIAGNOSIS — M5134 Other intervertebral disc degeneration, thoracic region: Secondary | ICD-10-CM | POA: Diagnosis not present

## 2018-11-28 DIAGNOSIS — M9902 Segmental and somatic dysfunction of thoracic region: Secondary | ICD-10-CM | POA: Diagnosis not present

## 2018-11-28 DIAGNOSIS — M5416 Radiculopathy, lumbar region: Secondary | ICD-10-CM | POA: Diagnosis not present

## 2018-11-28 NOTE — Telephone Encounter (Signed)
Patient notified

## 2018-11-30 DIAGNOSIS — M9902 Segmental and somatic dysfunction of thoracic region: Secondary | ICD-10-CM | POA: Diagnosis not present

## 2018-11-30 DIAGNOSIS — M5416 Radiculopathy, lumbar region: Secondary | ICD-10-CM | POA: Diagnosis not present

## 2018-11-30 DIAGNOSIS — M5134 Other intervertebral disc degeneration, thoracic region: Secondary | ICD-10-CM | POA: Diagnosis not present

## 2018-11-30 DIAGNOSIS — M62830 Muscle spasm of back: Secondary | ICD-10-CM | POA: Diagnosis not present

## 2018-12-03 DIAGNOSIS — M9902 Segmental and somatic dysfunction of thoracic region: Secondary | ICD-10-CM | POA: Diagnosis not present

## 2018-12-03 DIAGNOSIS — M62830 Muscle spasm of back: Secondary | ICD-10-CM | POA: Diagnosis not present

## 2018-12-03 DIAGNOSIS — M5416 Radiculopathy, lumbar region: Secondary | ICD-10-CM | POA: Diagnosis not present

## 2018-12-03 DIAGNOSIS — M5134 Other intervertebral disc degeneration, thoracic region: Secondary | ICD-10-CM | POA: Diagnosis not present

## 2018-12-04 ENCOUNTER — Ambulatory Visit: Payer: Medicare Other

## 2018-12-05 DIAGNOSIS — M9902 Segmental and somatic dysfunction of thoracic region: Secondary | ICD-10-CM | POA: Diagnosis not present

## 2018-12-05 DIAGNOSIS — M5416 Radiculopathy, lumbar region: Secondary | ICD-10-CM | POA: Diagnosis not present

## 2018-12-05 DIAGNOSIS — M62830 Muscle spasm of back: Secondary | ICD-10-CM | POA: Diagnosis not present

## 2018-12-05 DIAGNOSIS — M5134 Other intervertebral disc degeneration, thoracic region: Secondary | ICD-10-CM | POA: Diagnosis not present

## 2018-12-07 DIAGNOSIS — M62830 Muscle spasm of back: Secondary | ICD-10-CM | POA: Diagnosis not present

## 2018-12-07 DIAGNOSIS — M5134 Other intervertebral disc degeneration, thoracic region: Secondary | ICD-10-CM | POA: Diagnosis not present

## 2018-12-07 DIAGNOSIS — M5416 Radiculopathy, lumbar region: Secondary | ICD-10-CM | POA: Diagnosis not present

## 2018-12-07 DIAGNOSIS — M9902 Segmental and somatic dysfunction of thoracic region: Secondary | ICD-10-CM | POA: Diagnosis not present

## 2018-12-10 DIAGNOSIS — M5416 Radiculopathy, lumbar region: Secondary | ICD-10-CM | POA: Diagnosis not present

## 2018-12-10 DIAGNOSIS — M62830 Muscle spasm of back: Secondary | ICD-10-CM | POA: Diagnosis not present

## 2018-12-10 DIAGNOSIS — M5134 Other intervertebral disc degeneration, thoracic region: Secondary | ICD-10-CM | POA: Diagnosis not present

## 2018-12-10 DIAGNOSIS — M9902 Segmental and somatic dysfunction of thoracic region: Secondary | ICD-10-CM | POA: Diagnosis not present

## 2018-12-11 ENCOUNTER — Ambulatory Visit: Payer: Medicare Other

## 2018-12-12 DIAGNOSIS — M5134 Other intervertebral disc degeneration, thoracic region: Secondary | ICD-10-CM | POA: Diagnosis not present

## 2018-12-12 DIAGNOSIS — M5416 Radiculopathy, lumbar region: Secondary | ICD-10-CM | POA: Diagnosis not present

## 2018-12-12 DIAGNOSIS — M9902 Segmental and somatic dysfunction of thoracic region: Secondary | ICD-10-CM | POA: Diagnosis not present

## 2018-12-12 DIAGNOSIS — M62830 Muscle spasm of back: Secondary | ICD-10-CM | POA: Diagnosis not present

## 2018-12-14 DIAGNOSIS — M5134 Other intervertebral disc degeneration, thoracic region: Secondary | ICD-10-CM | POA: Diagnosis not present

## 2018-12-14 DIAGNOSIS — M5416 Radiculopathy, lumbar region: Secondary | ICD-10-CM | POA: Diagnosis not present

## 2018-12-14 DIAGNOSIS — M9902 Segmental and somatic dysfunction of thoracic region: Secondary | ICD-10-CM | POA: Diagnosis not present

## 2018-12-14 DIAGNOSIS — M62830 Muscle spasm of back: Secondary | ICD-10-CM | POA: Diagnosis not present

## 2018-12-17 DIAGNOSIS — M5134 Other intervertebral disc degeneration, thoracic region: Secondary | ICD-10-CM | POA: Diagnosis not present

## 2018-12-17 DIAGNOSIS — M62830 Muscle spasm of back: Secondary | ICD-10-CM | POA: Diagnosis not present

## 2018-12-17 DIAGNOSIS — M5416 Radiculopathy, lumbar region: Secondary | ICD-10-CM | POA: Diagnosis not present

## 2018-12-17 DIAGNOSIS — M9902 Segmental and somatic dysfunction of thoracic region: Secondary | ICD-10-CM | POA: Diagnosis not present

## 2018-12-18 ENCOUNTER — Ambulatory Visit: Payer: Medicare Other

## 2018-12-19 DIAGNOSIS — M5416 Radiculopathy, lumbar region: Secondary | ICD-10-CM | POA: Diagnosis not present

## 2018-12-19 DIAGNOSIS — M5134 Other intervertebral disc degeneration, thoracic region: Secondary | ICD-10-CM | POA: Diagnosis not present

## 2018-12-19 DIAGNOSIS — M9902 Segmental and somatic dysfunction of thoracic region: Secondary | ICD-10-CM | POA: Diagnosis not present

## 2018-12-19 DIAGNOSIS — M62830 Muscle spasm of back: Secondary | ICD-10-CM | POA: Diagnosis not present

## 2018-12-21 DIAGNOSIS — M9902 Segmental and somatic dysfunction of thoracic region: Secondary | ICD-10-CM | POA: Diagnosis not present

## 2018-12-21 DIAGNOSIS — M5134 Other intervertebral disc degeneration, thoracic region: Secondary | ICD-10-CM | POA: Diagnosis not present

## 2018-12-21 DIAGNOSIS — M5416 Radiculopathy, lumbar region: Secondary | ICD-10-CM | POA: Diagnosis not present

## 2018-12-21 DIAGNOSIS — M62830 Muscle spasm of back: Secondary | ICD-10-CM | POA: Diagnosis not present

## 2018-12-25 ENCOUNTER — Other Ambulatory Visit: Payer: Self-pay

## 2018-12-25 ENCOUNTER — Ambulatory Visit: Payer: Medicare Other | Attending: Family Medicine

## 2018-12-25 ENCOUNTER — Ambulatory Visit: Payer: Medicare Other

## 2018-12-25 DIAGNOSIS — G8929 Other chronic pain: Secondary | ICD-10-CM | POA: Insufficient documentation

## 2018-12-25 DIAGNOSIS — M5134 Other intervertebral disc degeneration, thoracic region: Secondary | ICD-10-CM | POA: Diagnosis not present

## 2018-12-25 DIAGNOSIS — M25561 Pain in right knee: Secondary | ICD-10-CM | POA: Diagnosis not present

## 2018-12-25 DIAGNOSIS — M6281 Muscle weakness (generalized): Secondary | ICD-10-CM

## 2018-12-25 DIAGNOSIS — M9902 Segmental and somatic dysfunction of thoracic region: Secondary | ICD-10-CM | POA: Diagnosis not present

## 2018-12-25 DIAGNOSIS — R262 Difficulty in walking, not elsewhere classified: Secondary | ICD-10-CM | POA: Diagnosis not present

## 2018-12-25 DIAGNOSIS — M79672 Pain in left foot: Secondary | ICD-10-CM | POA: Diagnosis not present

## 2018-12-25 DIAGNOSIS — M25562 Pain in left knee: Secondary | ICD-10-CM | POA: Insufficient documentation

## 2018-12-25 DIAGNOSIS — M5416 Radiculopathy, lumbar region: Secondary | ICD-10-CM | POA: Diagnosis not present

## 2018-12-25 DIAGNOSIS — M79671 Pain in right foot: Secondary | ICD-10-CM | POA: Insufficient documentation

## 2018-12-25 DIAGNOSIS — M62830 Muscle spasm of back: Secondary | ICD-10-CM | POA: Diagnosis not present

## 2018-12-25 NOTE — Therapy (Signed)
[facility] [address], [place], [address] Phone: [phone]   Fax:  [phone]  Physical Therapy Treatment  Patient Details  Name: Angel [name] MRN: [id number] Date of Birth: [birth year] Referring Provider (PT): Enid Derry, MD   Encounter Date: 12/25/2018  PT End of Session - 12/25/18 1007    Visit Number  15    Number of Visits  37    Date for PT Re-Evaluation  02/07/19    Authorization Type  5    Authorization Time Period  of 10 progress report    PT Start Time  1008    PT Stop Time  1053    PT Time Calculation (min)  45 min    Activity Tolerance  Patient tolerated treatment well    Behavior During Therapy  [facility] for tasks assessed/performed       Past Medical History:  Diagnosis Date  . Anxiety   . Aortic aneurysm without rupture ([place])   . CAD (coronary artery disease)   . Chronic hip pain   . Endometriosis   . Hearing loss   . HTN, goal below 150/90   . Hyperlipidemia LDL goal <70 11/18/2014  . Osteopenia of the elderly   . Urge and stress incontinence     Past Surgical History:  Procedure Laterality Date  . ABDOMINAL HYSTERECTOMY  1976   total  . APPENDECTOMY    . BREAST EXCISIONAL BIOPSY Left 1987   benign    There were no vitals filed for this visit.  Subjective Assessment - 12/25/18 1009    Subjective  Knees are ok. Pain is not so much in her knees but her feet bother her. The R foot bothered her when walking 2/10. Helps when she walks in a heel toe pattern.  Pt has been going to the chiropractor every other day for her back. There has been imbalance with her back. Has been going to her chiropractor for years now. Doing better. Does exercises daily and has been walking 20-30 minutes.  Walking up and down steps is still not any better. Feels like there is some regression since she was not able to come in. Wants to re-establish the schedule.  0/10 L knee (3/10 L knee at worst for  the past 2 weeks), 0/10 R knee (3/10 R knee at most), 0/10 L foot (3/10 L foot at worst for the past 2 weeks), 2/10 R lateral foot pain when walking (5/10 R foot at most for the past 2 weeks)    Pertinent History  B knee pain since a year or more. Walking up the steps bothers her. Walking around regularly is no pain. Pt lives in a 2 story home, first floor set up. 14 steps to get to second floor, L banister rail starting a third of the way up.  No steps to enter front and back door.  Gradual onset of B knee pain. Has been to Dr. Marry Guan who did x-rays who said that there is still cartilage in both knees. Was diagnosed with osteoarthritis.  [place]. Walking up hills bothers her knees.  Pt also walks about 20 minutes a day walking her dog.  R knee feels fragile when she starts with it to step up a step therefore she starts with her L, then she can step up with her R LE. Also goes to a chiropracter for adjustments.  Pt also has 5 step to go down to her meditation room, R rail assist in her home.  Has not yet used ice for her knees.   Pt adds that she has B foot pronation and plantar arch pain in which her orthotics that she wears helps her a lot.     Patient Stated Goals  Greater mobility, walk longer.     Currently in Pain?  Yes    Pain Score  2    R lateral foot   Pain Onset  More than a month ago         [facility] PT Assessment - 12/25/18 1021      Strength   Right Hip Extension  4/5   seated manually resisted hip extension   Right Hip ABduction  4+/5   seated manually resisted clamshell   Left Hip Extension  4/5   seated manually resisted hip extension   Left Hip ABduction  4/5   seated manually resisted clamshell                          PT Education - 12/25/18 1019    Education Details  ther-ex    Person(s) Educated  Patient    Methods  Explanation;Demonstration;Tactile cues;Verbal cues    Comprehension   Returned demonstration;Verbalized understanding       Objective  No latex band allergies Blood pressure is controlled per pt.  osteopenia  B Medial knee pain, with swelling R > L   MedbridgeAccess Code: [email or web address]   Aortic aneurism osteopenia  Limited L ankle DF AROM ( to neutral, stiff end feel)  The patient has been informed of current processes in place at [facility]patient [facility] to protect patients from Covid-19 exposure including social distancing, schedule modifications, and new cleaning procedures. After discussing their particular risk with a therapist based on the patient's personal risk factors, the patient has decided to proceed with in-person therapy.   Therapeutic exercise   Seated manually resisted hip extension, clamshell isometrics (hips less than 90 degrees flexion) 1-2x each way for each LE  Reviewed current status with strength with pt.   B ankle seated DF AROM: R ankle WFL, L ankle limited  Gait x 6 minutes  Pt demonstrates antalgic pattern, decreased stance L LE, R foot pronation during foot flat phase, ipsilateral lateral lean during stance phase  910 ft, no AD  Decreased R lateral foot pain with increased distance walked.   Ascending and descendding 4 regular steps with B UE assist 1x. Increased B knee pain   Reproduction of R plantar lateral foot symptoms with manual R ankle DF and EV No reproduction of foot symptoms with SLUMP test on R   Seated hip ER   R 10x5 seconds  L 10x5 seconds  Reviewed HEP. Based on review, pt has not been performing her HEP. Pt demonstrated and verbalized understanding.     Improved exercise technique, movement at target joints, use of target muscles after mod verbal, visual, tactile cues.     Response to treatment Pt tolerated session well without aggravation of symptoms.    Clinical impression Clinic re-opened to continue treating patients with non-surgical conditions and treatment for pt  resumed. Pt currently demonstrates decreased B hip extension and abduction strength, decreased distance walked in 6 minutes, L foot pain and continues to have difficulty with stair negotiation compared to previous sessions before clinic closure due to COVID-19 related restrictions. Reviewed HEP with pt to promote  hip strength and ankle DF ROM to promote proper mechanics to her knees and feet during gait and to help decrease pain secondary to pt not performing her HEP while on hiatus. Pt will benefit from continued skilled physical therapy services to improve B LE strength, decrease B knee and foot pain, improve function, and ability to ambulate.        PT Short Term Goals - 09/20/18 1905      PT SHORT TERM GOAL #1   Title  Patient will be independent with her HEP to decrease pain, improve strength and function.     Time  3    Period  Weeks    Status  On-going    Target Date  10/11/18        PT Long Term Goals - 12/25/18 1020      PT LONG TERM GOAL #1   Title  Patient will have a decrease in B knee pain to 2/10 or less at worst to decrease difficulty negotiating stairs or climbing up hills.     Baseline  R knee 5/10 , L knee 4.5/10 at worst for the past 3 months (07/16/2018); 6/10 R knee pain at most, 4.5/10 L knee pain at most for the past 7 days (09/20/2018); 3/10 B knee pain at worst for the past 7 days (09/27/2018); 3/10 B knee pain at worst for the past 2 weeks (12/25/2018)    Time  6    Period  Weeks    Status  Partially Met    Target Date  02/07/19      PT LONG TERM GOAL #2   Title  Patient will be able to ascend and descend 4 regular steps with bilateral UE assist at least 2 x with minimal to no complain of B knee pain.     Baseline  Increased B knee pain with ascending and descending 4 regular steps with B UE assist (07/16/2018). Still has pain but feels slight improvement (09/20/2018); increased B knee pain with stairs (12/25/2018)    Time  6    Period  Weeks    Status  On-going     Target Date  02/07/19      PT LONG TERM GOAL #3   Title  Patient will improve B hip extension and abduction strengh by at least 1/2 MMT to promote ability to negotiate stairs and climb hills with less pain.     Time  6    Period  Weeks    Status  On-going    Target Date  02/07/19      PT LONG TERM GOAL #4   Title  Patient will improve her 6 minute walk distance by at least 100 ft to promote mobility.     Baseline  1045 ft with L lateral hip pain/discomfort (07/16/2018); 1082 ft (09/20/2018); 910 ft (12/25/2018)    Time  6    Period  Weeks    Status  On-going    Target Date  02/07/19      PT LONG TERM GOAL #5   Title  Patient will have a decrease in R and L foot pain to 2/10 or less at worst to promote ability to ambulate with less difficulty.     Baseline  5/10 R foot pain, 3/10 L foot pain at most for the past 2 weeks (12/25/2018)    Time  6    Period  Weeks    Status  New    Target Date  02/07/19  Plan - 12/25/18 1007    Clinical Impression Statement  Clinic re-opened to continue treating patients with non-surgical conditions and treatment for pt resumed. Pt currently demonstrates decreased B hip extension and abduction strength, decreased distance walked in 6 minutes, L foot pain and continues to have difficulty with stair negotiation compared to previous sessions before clinic closure due to COVID-19 related restrictions. Reviewed HEP with pt to promote hip strength and ankle DF ROM to promote proper mechanics to her knees and feet during gait and to help decrease pain secondary to pt not performing her HEP while on hiatus. Pt will benefit from continued skilled physical therapy services to improve B LE strength, decrease B knee and foot pain, improve function, and ability to ambulate.     Personal Factors and Comorbidities  Age;Comorbidity 2    Comorbidities  aortic aneurism, osteopenia    Examination-Activity Limitations  Carry;Squat;Stairs;Locomotion Level     Stability/Clinical Decision Making  Evolving/Moderate complexity    Clinical Decision Making  Moderate    Clinical Presentation due to:  decreased B hip strength, decreased distance walked with 6 minute walk test     Rehab Potential  Fair    Clinical Impairments Affecting Rehab Potential  (-) age, chronicity of knee pain, hip weakenss. (+) motivated    PT Frequency  2x / week    PT Duration  6 weeks    PT Treatment/Interventions  Aquatic Therapy;Electrical Stimulation;Iontophoresis 2m/ml Dexamethasone;Gait training;Stair training;Therapeutic activities;Therapeutic exercise;Balance training;Neuromuscular re-education;Patient/family education;Manual techniques;Dry needling    PT Next Visit Plan  hip strengthening, femoral control, manual techniques, modalities PRN    Consulted and Agree with Plan of Care  Patient       Patient will benefit from skilled therapeutic intervention in order to improve the following deficits and impairments:  Pain, Postural dysfunction, Improper body mechanics, Decreased strength, Decreased range of motion  Visit Diagnosis: Chronic pain of right knee - Plan: PT plan of care cert/re-cert  Chronic pain of left knee - Plan: PT plan of care cert/re-cert  Muscle weakness (generalized) - Plan: PT plan of care cert/re-cert  Pain in left foot - Plan: PT plan of care cert/re-cert  Pain in right foot - Plan: PT plan of care cert/re-cert  Difficulty in walking, not elsewhere classified - Plan: PT plan of care cert/re-cert     Problem List Patient Active Problem List   Diagnosis Date Noted  . Arthritis of knee, degenerative 05/22/2018  . Cataract, right eye 05/17/2018  . Aortic stenosis, mild 02/22/2018  . Obesity (BMI 30.0-34.9) 01/18/2018  . Medication monitoring encounter 12/30/2015  . Productive cough 11/02/2015  . Skin lesion 08/04/2015  . 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 Hip pain, chronic 04/02/2015  . Hearing loss of both ears 04/02/2015  . Osteopenia of  the elderly 04/02/2015  . Hormone replacement therapy (postmenopausal) 04/02/2015  . [place]  . Situational anxiety 04/02/2015  . Dermatitis 04/02/2015  . Hyperlipidemia LDL goal <70 11/18/2014  . Incomplete bladder emptying 06/29/2014  . Mixed stress and urge urinary incontinence 06/29/2014  . Urge incontinence of urine 06/29/2014  . Hypertension goal BP (blood pressure) < 140/90 11/06/2013  . CAD (coronary artery disease) 11/06/2013   MJoneen BoersPT, DPT   12/25/2018, 6:29 PM  [facility] [address] [place] [address]Phone: [phone]  Fax:  [phone] Name: Angel GOMMMRN: [id number] of Birth: [birth year]

## 2018-12-27 ENCOUNTER — Other Ambulatory Visit: Payer: Self-pay

## 2018-12-27 ENCOUNTER — Ambulatory Visit: Payer: Medicare Other

## 2018-12-27 DIAGNOSIS — M79671 Pain in right foot: Secondary | ICD-10-CM

## 2018-12-27 DIAGNOSIS — M5416 Radiculopathy, lumbar region: Secondary | ICD-10-CM | POA: Diagnosis not present

## 2018-12-27 DIAGNOSIS — M5134 Other intervertebral disc degeneration, thoracic region: Secondary | ICD-10-CM | POA: Diagnosis not present

## 2018-12-27 DIAGNOSIS — M62830 Muscle spasm of back: Secondary | ICD-10-CM | POA: Diagnosis not present

## 2018-12-27 DIAGNOSIS — R262 Difficulty in walking, not elsewhere classified: Secondary | ICD-10-CM

## 2018-12-27 DIAGNOSIS — M79672 Pain in left foot: Secondary | ICD-10-CM | POA: Diagnosis not present

## 2018-12-27 DIAGNOSIS — M6281 Muscle weakness (generalized): Secondary | ICD-10-CM

## 2018-12-27 DIAGNOSIS — G8929 Other chronic pain: Secondary | ICD-10-CM | POA: Diagnosis not present

## 2018-12-27 DIAGNOSIS — M9902 Segmental and somatic dysfunction of thoracic region: Secondary | ICD-10-CM | POA: Diagnosis not present

## 2018-12-27 DIAGNOSIS — M25561 Pain in right knee: Secondary | ICD-10-CM | POA: Diagnosis not present

## 2018-12-27 DIAGNOSIS — M25562 Pain in left knee: Secondary | ICD-10-CM | POA: Diagnosis not present

## 2018-12-27 NOTE — Therapy (Signed)
[facility] [address], [place], [address] Phone: [phone]   Fax:  [phone]  Physical Therapy Treatment  Patient Details  Name: Angel [name] MRN: [id number] Date of Birth: 20-Apr-1931 Referring Provider (PT): Enid Derry, MD   Encounter Date: 12/27/2018  PT End of Session - 12/27/18 1004    Visit Number  16    Number of Visits  37    Date for PT Re-Evaluation  02/07/19    Authorization Type  6    Authorization Time Period  of 10 progress report    PT Start Time  1006    PT Stop Time  1050    PT Time Calculation (min)  44 min    Activity Tolerance  Patient tolerated treatment well    Behavior During Therapy  [facility] for tasks assessed/performed       Past Medical History:  Diagnosis Date  . Anxiety   . Aortic aneurysm without rupture ([place])   . CAD (coronary artery disease)   . Chronic hip pain   . Endometriosis   . Hearing loss   . HTN, goal below 150/90   . Hyperlipidemia LDL goal <70 11/18/2014  . Osteopenia of the elderly   . Urge and stress incontinence     Past Surgical History:  Procedure Laterality Date  . ABDOMINAL HYSTERECTOMY  1976   total  . APPENDECTOMY    . BREAST EXCISIONAL BIOPSY Left 1987   benign    There were no vitals filed for this visit.  Subjective Assessment - 12/27/18 1006    Subjective  R foot is better today for some odd reason. Thinks that going to PT has improved her spirit. 2.5/10 R foot pain with gait. No B knee or L foot pain.     Pertinent History  B knee pain since a year or more. Walking up the steps bothers her. Walking around regularly is no pain. Pt lives in a 2 story home, first floor set up. 14 steps to get to second floor, L banister rail starting a third of the way up.  No steps to enter front and back door.  Gradual onset of B knee pain. Has been to Dr. Marry Guan who did x-rays who said that there is still cartilage in both knees. Was diagnosed  with osteoarthritis.  [place]. Walking up hills bothers her knees.  Pt also walks about 20 minutes a day walking her dog.  R knee feels fragile when she starts with it to step up a step therefore she starts with her L, then she can step up with her R LE. Also goes to a chiropracter for adjustments.  Pt also has 5 step to go down to her meditation room, R rail assist in her home.  Has not yet used ice for her knees.   Pt adds that she has B foot pronation and plantar arch pain in which her orthotics that she wears helps her a lot.     Patient Stated Goals  Greater mobility, walk longer.     Currently in Pain?  Yes    Pain Score  3    2.5/10 R foot   Pain Onset  More than a month ago  PT Education - 12/27/18 1010    Education Details  ther-ex    Person(s) Educated  Patient    Methods  Explanation;Demonstration;Tactile cues;Verbal cues    Comprehension  Verbalized understanding;Returned demonstration       Objective  No latex band allergies Blood pressure is controlled per pt. osteopenia     MedbridgeAccess Code: [email or web address]   Aortic aneurism osteopenia  Limited L ankle DF AROM ( to neutral, stiff end feel)  Manual therapy   Seated STM gastroc and fibularis muscles Seated STM R lateral hamstrings and IT band    Therapeutic exercise  Seated hip ER              R 10x5 seconds for 2 sets             L 10x5 seconds for 2 sets  Seated manually resisted clamshell isometrics 10x5 seconds for 3 sets  Seated manually resisted hip extension   R 10x3  L 10x3  Seated manually resisted R ankle IV 10x5 seconds for 3 sets  Improved exercise technique, movement at target joints, use of target muscles after mod verbal, visual, tactile cues.    Response to treatment Good muscle use felt with exercises. No pain after session   Clinical  impression  Continued working on improving B glute med and max strength as well as R ankle IV to help improve LE mechanics and decrease knee and R lateral ankle pain. Also worked on [employer], lateral hamstrings and IT band tension to promote better R LE mechanics as well. No R foot or B knee pain with gait after session. Pt will benefit from continued skilled physical therapy services to decrease pain, improve strength, function.         PT Short Term Goals - 09/20/18 1905      PT SHORT TERM GOAL #1   Title  Patient will be independent with her HEP to decrease pain, improve strength and function.     Time  3    Period  Weeks    Status  On-going    Target Date  10/11/18        PT Long Term Goals - 12/25/18 1020      PT LONG TERM GOAL #1   Title  Patient will have a decrease in B knee pain to 2/10 or less at worst to decrease difficulty negotiating stairs or climbing up hills.     Baseline  R knee 5/10 , L knee 4.5/10 at worst for the past 3 months (07/16/2018); 6/10 R knee pain at most, 4.5/10 L knee pain at most for the past 7 days (09/20/2018); 3/10 B knee pain at worst for the past 7 days (09/27/2018); 3/10 B knee pain at worst for the past 2 weeks (12/25/2018)    Time  6    Period  Weeks    Status  Partially Met    Target Date  02/07/19      PT LONG TERM GOAL #2   Title  Patient will be able to ascend and descend 4 regular steps with bilateral UE assist at least 2 x with minimal to no complain of B knee pain.     Baseline  Increased B knee pain with ascending and descending 4 regular steps with B UE assist (07/16/2018). Still has pain but feels slight improvement (09/20/2018); increased B knee pain with stairs (12/25/2018)    Time  6    Period  Weeks    Status  On-going    Target Date  02/07/19      PT LONG TERM GOAL #3   Title  Patient will improve B hip extension and abduction strengh by at least 1/2 MMT to promote ability to negotiate stairs and climb hills with  less pain.     Time  6    Period  Weeks    Status  On-going    Target Date  02/07/19      PT LONG TERM GOAL #4   Title  Patient will improve her 6 minute walk distance by at least 100 ft to promote mobility.     Baseline  1045 ft with L lateral hip pain/discomfort (07/16/2018); 1082 ft (09/20/2018); 910 ft (12/25/2018)    Time  6    Period  Weeks    Status  On-going    Target Date  02/07/19      PT LONG TERM GOAL #5   Title  Patient will have a decrease in R and L foot pain to 2/10 or less at worst to promote ability to ambulate with less difficulty.     Baseline  5/10 R foot pain, 3/10 L foot pain at most for the past 2 weeks (12/25/2018)    Time  6    Period  Weeks    Status  New    Target Date  02/07/19            Plan - 12/27/18 1000    Clinical Impression Statement  Continued working on improving B glute med and max strength as well as R ankle IV to help improve LE mechanics and decrease knee and R lateral ankle pain. Also worked on [employer], lateral hamstrings and IT band tension to promote better R LE mechanics as well. No R foot or B knee pain with gait after session. Pt will benefit from continued skilled physical therapy services to decrease pain, improve strength, function.     Personal Factors and Comorbidities  Age;Comorbidity 2    Comorbidities  aortic aneurism, osteopenia    Examination-Activity Limitations  Carry;Squat;Stairs;Locomotion Level    Stability/Clinical Decision Making  Evolving/Moderate complexity    Rehab Potential  Fair    Clinical Impairments Affecting Rehab Potential  (-) age, chronicity of knee pain, hip weakenss. (+) motivated    PT Frequency  2x / week    PT Duration  6 weeks    PT Treatment/Interventions  Aquatic Therapy;Electrical Stimulation;Iontophoresis 68m/ml Dexamethasone;Gait training;Stair training;Therapeutic activities;Therapeutic exercise;Balance training;Neuromuscular re-education;Patient/family education;Manual  techniques;Dry needling    PT Next Visit Plan  hip strengthening, femoral control, manual techniques, modalities PRN    Consulted and Agree with Plan of Care  Patient       Patient will benefit from skilled therapeutic intervention in order to improve the following deficits and impairments:  Pain, Postural dysfunction, Improper body mechanics, Decreased strength, Decreased range of motion  Visit Diagnosis: Muscle weakness (generalized)  Pain in right foot  Difficulty in walking, not elsewhere classified     Problem List Patient Active Problem List   Diagnosis Date Noted  . Arthritis of knee, degenerative 05/22/2018  . Cataract, right eye 05/17/2018  . Aortic stenosis, mild 02/22/2018  . Obesity (BMI 30.0-34.9) 01/18/2018  . Medication monitoring encounter 12/30/2015  . Productive cough 11/02/2015  . Skin lesion 08/04/2015  . 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 Hip pain, chronic 04/02/2015  . Hearing loss of both ears 04/02/2015  . Osteopenia of the elderly 04/02/2015  . Hormone replacement  therapy (postmenopausal) 04/02/2015  . Aortic aneurysm without rupture ([place]) 04/02/2015  . Situational anxiety 04/02/2015  . Dermatitis 04/02/2015  . Hyperlipidemia LDL goal <70 11/18/2014  . Incomplete bladder emptying 06/29/2014  . Mixed stress and urge urinary incontinence 06/29/2014  . Urge incontinence of urine 06/29/2014  . Hypertension goal BP (blood pressure) < 140/90 11/06/2013  . CAD (coronary artery disease) 11/06/2013    Joneen Boers PT, DPT   12/27/2018, 6:11 PM  [facility] [address] [place], [address] Phone: [phone]   Fax:  [phone]  Name: Angel [name] MRN: [id number] Date of Birth: 10-01-1930

## 2019-01-01 ENCOUNTER — Ambulatory Visit: Payer: Medicare Other

## 2019-01-01 ENCOUNTER — Other Ambulatory Visit: Payer: Self-pay

## 2019-01-01 ENCOUNTER — Telehealth: Payer: Self-pay | Admitting: Interventional Cardiology

## 2019-01-01 DIAGNOSIS — G8929 Other chronic pain: Secondary | ICD-10-CM | POA: Diagnosis not present

## 2019-01-01 DIAGNOSIS — M25562 Pain in left knee: Secondary | ICD-10-CM

## 2019-01-01 DIAGNOSIS — M9902 Segmental and somatic dysfunction of thoracic region: Secondary | ICD-10-CM | POA: Diagnosis not present

## 2019-01-01 DIAGNOSIS — M79672 Pain in left foot: Secondary | ICD-10-CM | POA: Diagnosis not present

## 2019-01-01 DIAGNOSIS — M79671 Pain in right foot: Secondary | ICD-10-CM | POA: Diagnosis not present

## 2019-01-01 DIAGNOSIS — M25561 Pain in right knee: Secondary | ICD-10-CM | POA: Diagnosis not present

## 2019-01-01 DIAGNOSIS — R262 Difficulty in walking, not elsewhere classified: Secondary | ICD-10-CM

## 2019-01-01 DIAGNOSIS — M5416 Radiculopathy, lumbar region: Secondary | ICD-10-CM | POA: Diagnosis not present

## 2019-01-01 DIAGNOSIS — M62830 Muscle spasm of back: Secondary | ICD-10-CM | POA: Diagnosis not present

## 2019-01-01 DIAGNOSIS — M6281 Muscle weakness (generalized): Secondary | ICD-10-CM | POA: Diagnosis not present

## 2019-01-01 DIAGNOSIS — M5134 Other intervertebral disc degeneration, thoracic region: Secondary | ICD-10-CM | POA: Diagnosis not present

## 2019-01-01 NOTE — Therapy (Signed)
[facility] [address], [place], [address] Phone: [phone]   Fax:  [phone]  Physical Therapy Treatment  Patient Details  Name: Angel [name] MRN: [id number] Date of Birth: [birth year] Referring Provider (PT): Enid Derry, MD   Encounter Date: 01/01/2019  PT End of Session - 01/01/19 1006    Visit Number  17    Number of Visits  37    Date for PT Re-Evaluation  02/07/19    Authorization Type  7    Authorization Time Period  of 10 progress report    PT Start Time  1006   pt arrived late   PT Stop Time  1048    PT Time Calculation (min)  42 min    Activity Tolerance  Patient tolerated treatment well    Behavior During Therapy  [facility] for tasks assessed/performed       Past Medical History:  Diagnosis Date  . Anxiety   . Aortic aneurysm without rupture ([place])   . CAD (coronary artery disease)   . Chronic hip pain   . Endometriosis   . Hearing loss   . HTN, goal below 150/90   . Hyperlipidemia LDL goal <70 11/18/2014  . Osteopenia of the elderly   . Urge and stress incontinence     Past Surgical History:  Procedure Laterality Date  . ABDOMINAL HYSTERECTOMY  1976   total  . APPENDECTOMY    . BREAST EXCISIONAL BIOPSY Left 1987   benign    There were no vitals filed for this visit.  Subjective Assessment - 01/01/19 1007    Subjective  R foot is good today. No pain. No B knee pain today.     Pertinent History  B knee pain since a year or more. Walking up the steps bothers her. Walking around regularly is no pain. Pt lives in a 2 story home, first floor set up. 14 steps to get to second floor, L banister rail starting a third of the way up.  No steps to enter front and back door.  Gradual onset of B knee pain. Has been to Dr. Marry Guan who did x-rays who said that there is still cartilage in both knees. Was diagnosed with osteoarthritis.  [place]. Walking up hills bothers her knees.  Pt also walks about 20 minutes a day walking her dog.  R knee feels fragile when she starts with it to step up a step therefore she starts with her L, then she can step up with her R LE. Also goes to a chiropracter for adjustments.  Pt also has 5 step to go down to her meditation room, R rail assist in her home.  Has not yet used ice for her knees.   Pt adds that she has B foot pronation and plantar arch pain in which her orthotics that she wears helps her a lot.     Patient Stated Goals  Greater mobility, walk longer.     Currently in Pain?  No/denies    Pain Score  0-No pain    Pain Onset  More than a month ago                               PT Education - 01/01/19 1026    Education Details  ther-ex    Person(s)  Educated  Patient    Methods  Explanation;Demonstration;Tactile cues;Verbal cues    Comprehension  Returned demonstration;Verbalized understanding      Objective  No latex band allergies Blood pressure is controlled per pt. osteopenia     MedbridgeAccess Code: [email or web address]   Aortic aneurism osteopenia  Limited L ankle DF AROM ( to neutral, stiff end feel)   (-) R calf squeeze test  Manual therapy  Seated STM gastroc and fibularis muscles Seated STM R lateral hamstrings and IT band     Therapeutic exercise  Seated hip ER  R 10x5 seconds  Seated manually resisted R ankle IV 10x5 seconds for 3 sets  Seated manually resisted clamshell isometrics 10x5 seconds for 3 sets  Seated manually resisted hip extension              R 10x3             L 10x3  Forward step up onto 4 inch step with one UE assist, emphasis on femoral control   R 10x  L 10x  Improved exercise technique, movement at target joints, use of target muscles after min to mod verbal, visual, tactile cues.    Response to treatment Good muscle use felt with  exercises.No pain throughout and after session   Clinical impression Continued working on glute med and max strengthening as well as decreasing R foot pronation as well as decreasing lateral hamstrings and lateral gastroc muscle tension to promote more neutral joint mechanics to R knee and ankle and to help decrease stress to R knee and lateral foot. No complain of knee or foot pain throughout session. Pt will benefit from continued skilled physical therapy services to decrease pain, improve strength, femoral control, and function.     PT Short Term Goals - 09/20/18 1905      PT SHORT TERM GOAL #1   Title  Patient will be independent with her HEP to decrease pain, improve strength and function.     Time  3    Period  Weeks    Status  On-going    Target Date  10/11/18        PT Long Term Goals - 12/25/18 1020      PT LONG TERM GOAL #1   Title  Patient will have a decrease in B knee pain to 2/10 or less at worst to decrease difficulty negotiating stairs or climbing up hills.     Baseline  R knee 5/10 , L knee 4.5/10 at worst for the past 3 months (07/16/2018); 6/10 R knee pain at most, 4.5/10 L knee pain at most for the past 7 days (09/20/2018); 3/10 B knee pain at worst for the past 7 days (09/27/2018); 3/10 B knee pain at worst for the past 2 weeks (12/25/2018)    Time  6    Period  Weeks    Status  Partially Met    Target Date  02/07/19      PT LONG TERM GOAL #2   Title  Patient will be able to ascend and descend 4 regular steps with bilateral UE assist at least 2 x with minimal to no complain of B knee pain.     Baseline  Increased B knee pain with ascending and descending 4 regular steps with B UE assist (07/16/2018). Still has pain but feels slight improvement (09/20/2018); increased B knee pain with stairs (12/25/2018)    Time  6    Period  Weeks    Status  On-going    Target Date  02/07/19      PT LONG TERM GOAL #3   Title  Patient will improve B hip extension and abduction  strengh by at least 1/2 MMT to promote ability to negotiate stairs and climb hills with less pain.     Time  6    Period  Weeks    Status  On-going    Target Date  02/07/19      PT LONG TERM GOAL #4   Title  Patient will improve her 6 minute walk distance by at least 100 ft to promote mobility.     Baseline  1045 ft with L lateral hip pain/discomfort (07/16/2018); 1082 ft (09/20/2018); 910 ft (12/25/2018)    Time  6    Period  Weeks    Status  On-going    Target Date  02/07/19      PT LONG TERM GOAL #5   Title  Patient will have a decrease in R and L foot pain to 2/10 or less at worst to promote ability to ambulate with less difficulty.     Baseline  5/10 R foot pain, 3/10 L foot pain at most for the past 2 weeks (12/25/2018)    Time  6    Period  Weeks    Status  New    Target Date  02/07/19            Plan - 01/01/19 1030    Clinical Impression Statement  Continued working on glute med and max strengthening as well as decreasing R foot pronation as well as decreasing lateral hamstrings and lateral gastroc muscle tension to promote more neutral joint mechanics to R knee and ankle and to help decrease stress to R knee and lateral foot. No complain of knee or foot pain throughout session. Pt will benefit from continued skilled physical therapy services to decrease pain, improve strength, femoral control, and function.     Personal Factors and Comorbidities  Age;Comorbidity 2    Comorbidities  aortic aneurism, osteopenia    Examination-Activity Limitations  Carry;Squat;Stairs;Locomotion Level    Stability/Clinical Decision Making  Evolving/Moderate complexity    Rehab Potential  Fair    Clinical Impairments Affecting Rehab Potential  (-) age, chronicity of knee pain, hip weakenss. (+) motivated    PT Frequency  2x / week    PT Duration  6 weeks    PT Treatment/Interventions  Aquatic Therapy;Electrical Stimulation;Iontophoresis 59m/ml Dexamethasone;Gait training;Stair  training;Therapeutic activities;Therapeutic exercise;Balance training;Neuromuscular re-education;Patient/family education;Manual techniques;Dry needling    PT Next Visit Plan  hip strengthening, femoral control, manual techniques, modalities PRN    Consulted and Agree with Plan of Care  Patient       Patient will benefit from skilled therapeutic intervention in order to improve the following deficits and impairments:  Pain, Postural dysfunction, Improper body mechanics, Decreased strength, Decreased range of motion  Visit Diagnosis: Muscle weakness (generalized)  Pain in right foot  Difficulty in walking, not elsewhere classified  Chronic pain of right knee  Chronic pain of left knee     Problem List Patient Active Problem List   Diagnosis Date Noted  . Arthritis of knee, degenerative 05/22/2018  . Cataract, right eye 05/17/2018  . Aortic stenosis, mild 02/22/2018  . Obesity (BMI 30.0-34.9) 01/18/2018  . Medication monitoring encounter 12/30/2015  . Productive cough 11/02/2015  . Skin lesion 08/04/2015  . 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 Hip pain, chronic 04/02/2015  . Hearing loss of both ears  04/02/2015  . Osteopenia of the elderly 04/02/2015  . Hormone replacement therapy (postmenopausal) 04/02/2015  . Aortic aneurysm without rupture ([place]) 04/02/2015  . Situational anxiety 04/02/2015  . Dermatitis 04/02/2015  . Hyperlipidemia LDL goal <70 11/18/2014  . Incomplete bladder emptying 06/29/2014  . Mixed stress and urge urinary incontinence 06/29/2014  . Urge incontinence of urine 06/29/2014  . Hypertension goal BP (blood pressure) < 140/90 11/06/2013  . CAD (coronary artery disease) 11/06/2013    Joneen Boers PT, DPT   01/01/2019, 10:59 AM  [place] [facility] [address] [place], [address] Phone: [phone]   Fax:  [phone]  Name: Angel [name] MRN: [id number] Date of Birth: [birth year]

## 2019-01-01 NOTE — Telephone Encounter (Signed)
New message:   Patient calling concering a appt to see the doctor after her ECHO is done but there is nothing on his schedule. Please call patient back concering getting appt around that time.

## 2019-01-03 ENCOUNTER — Ambulatory Visit: Payer: Medicare Other

## 2019-01-03 ENCOUNTER — Other Ambulatory Visit: Payer: Self-pay

## 2019-01-03 DIAGNOSIS — M62830 Muscle spasm of back: Secondary | ICD-10-CM | POA: Diagnosis not present

## 2019-01-03 DIAGNOSIS — M5416 Radiculopathy, lumbar region: Secondary | ICD-10-CM | POA: Diagnosis not present

## 2019-01-03 DIAGNOSIS — M5134 Other intervertebral disc degeneration, thoracic region: Secondary | ICD-10-CM | POA: Diagnosis not present

## 2019-01-03 DIAGNOSIS — M9902 Segmental and somatic dysfunction of thoracic region: Secondary | ICD-10-CM | POA: Diagnosis not present

## 2019-01-07 DIAGNOSIS — M9902 Segmental and somatic dysfunction of thoracic region: Secondary | ICD-10-CM | POA: Diagnosis not present

## 2019-01-07 DIAGNOSIS — M5416 Radiculopathy, lumbar region: Secondary | ICD-10-CM | POA: Diagnosis not present

## 2019-01-07 DIAGNOSIS — M62830 Muscle spasm of back: Secondary | ICD-10-CM | POA: Diagnosis not present

## 2019-01-07 DIAGNOSIS — M5134 Other intervertebral disc degeneration, thoracic region: Secondary | ICD-10-CM | POA: Diagnosis not present

## 2019-01-08 ENCOUNTER — Ambulatory Visit: Payer: Medicare Other | Attending: Family Medicine

## 2019-01-08 ENCOUNTER — Ambulatory Visit: Payer: Medicare Other

## 2019-01-08 ENCOUNTER — Other Ambulatory Visit: Payer: Self-pay

## 2019-01-08 DIAGNOSIS — G8929 Other chronic pain: Secondary | ICD-10-CM | POA: Diagnosis not present

## 2019-01-08 DIAGNOSIS — M79671 Pain in right foot: Secondary | ICD-10-CM | POA: Insufficient documentation

## 2019-01-08 DIAGNOSIS — M6281 Muscle weakness (generalized): Secondary | ICD-10-CM | POA: Insufficient documentation

## 2019-01-08 DIAGNOSIS — M25562 Pain in left knee: Secondary | ICD-10-CM | POA: Insufficient documentation

## 2019-01-08 DIAGNOSIS — M25561 Pain in right knee: Secondary | ICD-10-CM | POA: Diagnosis not present

## 2019-01-08 DIAGNOSIS — M79672 Pain in left foot: Secondary | ICD-10-CM | POA: Diagnosis not present

## 2019-01-08 DIAGNOSIS — R262 Difficulty in walking, not elsewhere classified: Secondary | ICD-10-CM | POA: Diagnosis not present

## 2019-01-08 NOTE — Therapy (Signed)
[place] [facility] [address] [place], [address] Phone: [phone]   Fax:  [phone]  Physical Therapy Treatment  Patient Details  Name: Angel [name] MRN: [id number] Date of Birth: 1931-01-24 Referring Provider (PT): Enid Derry, MD   Encounter Date: 01/08/2019  PT End of Session - 01/08/19 1606    Visit Number  18    Number of Visits  37    Date for PT Re-Evaluation  02/07/19    Authorization Type  8    Authorization Time Period  of 10 progress report    PT Start Time  1605    PT Stop Time  1652    PT Time Calculation (min)  47 min    Activity Tolerance  Patient tolerated treatment well    Behavior During Therapy  [facility] for tasks assessed/performed       Past Medical History:  Diagnosis Date  . Anxiety   . Aortic aneurysm without rupture ([place])   . CAD (coronary artery disease)   . Chronic hip pain   . Endometriosis   . Hearing loss   . HTN, goal below 150/90   . Hyperlipidemia LDL goal <70 11/18/2014  . Osteopenia of the elderly   . Urge and stress incontinence     Past Surgical History:  Procedure Laterality Date  . ABDOMINAL HYSTERECTOMY  1976   total  . APPENDECTOMY    . BREAST EXCISIONAL BIOPSY Left 1987   benign    There were no vitals filed for this visit.  Subjective Assessment - 01/08/19 1607    Subjective  Feet are better today. This is a good day. No B foot pain today or yesterday. No B knee pain currently.     Pertinent History  B knee pain since a year or more. Walking up the steps bothers her. Walking around regularly is no pain. Pt lives in a 2 story home, first floor set up. 14 steps to get to second floor, L banister rail starting a third of the way up.  No steps to enter front and back door.  Gradual onset of B knee pain. Has been to Dr. Marry Guan who did x-rays who said that there is still cartilage in both knees. Was diagnosed with osteoarthritis.  [place]. Walking up hills bothers her knees.  Pt also walks about 20 minutes a day walking her dog.  R knee feels fragile when she starts with it to step up a step therefore she starts with her L, then she can step up with her R LE. Also goes to a chiropracter for adjustments.  Pt also has 5 step to go down to her meditation room, R rail assist in her home.  Has not yet used ice for her knees.   Pt adds that she has B foot pronation and plantar arch pain in which her orthotics that she wears helps her a lot.     Patient Stated Goals  Greater mobility, walk longer.     Currently in Pain?  No/denies    Pain Score  0-No pain    Pain Onset  More than a month ago                               PT Education - 01/08/19 1609    Education Details  ther-ex    Person(s) Educated  Patient    Methods  Explanation;Demonstration;Tactile cues;Verbal cues    Comprehension  Verbalized understanding;Returned demonstration      Objective  MedbridgeAccess Code: [email or web address]   No latex band allergies Blood pressure is controlled per pt.  Aortic aneurism osteopenia  Limited L ankle DF AROM ( to neutral, stiff end feel)      Manual therapy  Seated STM R gastroc and fibularismuscles Seated STM R lateral hamstrings and IT band  Seated STM L lateral hamstrings and IT band  Decreased L lateral knee pain with sit <> stand     Therapeutic exercise  Seated manually resisted R ankle IV 10x5 seconds for 3 sets  Seated manually resisted hip extension  R 10x3 L 10x3 Lateral step ups onto 4 inch step with one UE assist   R 10x  L 10x    Therapeutic rest break secondary to fatigue.   Seated L hip extension isometrics, 10x5 seconds for 2 sets  Sit <> stand from regular chair with arms with B UE assist 5x with L tibia in neutral  Decreased L lateral knee pain with sit <>  stand  Improved exercise technique, movement at target joints, use of target muscles after min to mod verbal, visual, tactile cues.   Response to treatment Good muscle use felt with exercises.Pt tolerated session well without aggravation of symptoms.   Clinical impression Continued working on decreasing R lateral gastroc, fibularis, lateral hamstrings and IT band tension on R LE to continue progress with decreased R foot pain. Worked on decreasing L lateral hamstrings and iliotibial band tension as well to help decrease L lateral knee pain with sit <> stand. L lateral knee pain with sit <> stand also decreased with more neutral positioning of L tibia. Pt tolerated session well without aggravation of symptoms. No B knee or B foot pain with gait after session. Pt will benefit from continued skilled physical therapy services to decrease B knee and foot pain, improve strength, function, and decrease difficulty with gait and stair negotiation.         PT Short Term Goals - 09/20/18 1905      PT SHORT TERM GOAL #1   Title  Patient will be independent with her HEP to decrease pain, improve strength and function.     Time  3    Period  Weeks    Status  On-going    Target Date  10/11/18        PT Long Term Goals - 12/25/18 1020      PT LONG TERM GOAL #1   Title  Patient will have a decrease in B knee pain to 2/10 or less at worst to decrease difficulty negotiating stairs or climbing up hills.     Baseline  R knee 5/10 , L knee 4.5/10 at worst for the past 3 months (07/16/2018); 6/10 R knee pain at most, 4.5/10 L knee pain at most for the past 7 days (09/20/2018); 3/10 B knee pain at worst for the past 7 days (09/27/2018); 3/10 B knee pain at worst for the past 2 weeks (12/25/2018)    Time  6    Period  Weeks    Status  Partially Met    Target Date  02/07/19      PT LONG TERM GOAL #2   Title  Patient will be able to ascend and descend 4 regular steps with bilateral UE assist at least 2  x with minimal  to no complain of B knee pain.     Baseline  Increased B knee pain with ascending and descending 4 regular steps with B UE assist (07/16/2018). Still has pain but feels slight improvement (09/20/2018); increased B knee pain with stairs (12/25/2018)    Time  6    Period  Weeks    Status  On-going    Target Date  02/07/19      PT LONG TERM GOAL #3   Title  Patient will improve B hip extension and abduction strengh by at least 1/2 MMT to promote ability to negotiate stairs and climb hills with less pain.     Time  6    Period  Weeks    Status  On-going    Target Date  02/07/19      PT LONG TERM GOAL #4   Title  Patient will improve her 6 minute walk distance by at least 100 ft to promote mobility.     Baseline  1045 ft with L lateral hip pain/discomfort (07/16/2018); 1082 ft (09/20/2018); 910 ft (12/25/2018)    Time  6    Period  Weeks    Status  On-going    Target Date  02/07/19      PT LONG TERM GOAL #5   Title  Patient will have a decrease in R and L foot pain to 2/10 or less at worst to promote ability to ambulate with less difficulty.     Baseline  5/10 R foot pain, 3/10 L foot pain at most for the past 2 weeks (12/25/2018)    Time  6    Period  Weeks    Status  New    Target Date  02/07/19            Plan - 01/08/19 1609    Clinical Impression Statement  Continued working on decreasing R lateral gastroc, fibularis, lateral hamstrings and IT band tension on R LE to continue progress with decreased R foot pain. Worked on decreasing L lateral hamstrings and iliotibial band tension as well to help decrease L lateral knee pain with sit <> stand. L lateral knee pain with sit <> stand also decreased with more neutral positioning of L tibia. Pt tolerated session well without aggravation of symptoms. No B knee or B foot pain with gait after session. Pt will benefit from continued skilled physical therapy services to decrease B knee and foot pain, improve strength, function,  and decrease difficulty with gait and stair negotiation.     Personal Factors and Comorbidities  Age;Comorbidity 2    Comorbidities  aortic aneurism, osteopenia    Examination-Activity Limitations  Carry;Squat;Stairs;Locomotion Level    Stability/Clinical Decision Making  Evolving/Moderate complexity    Rehab Potential  Fair    Clinical Impairments Affecting Rehab Potential  (-) age, chronicity of knee pain, hip weakenss. (+) motivated    PT Frequency  2x / week    PT Duration  6 weeks    PT Treatment/Interventions  Aquatic Therapy;Electrical Stimulation;Iontophoresis 61m/ml Dexamethasone;Gait training;Stair training;Therapeutic activities;Therapeutic exercise;Balance training;Neuromuscular re-education;Patient/family education;Manual techniques;Dry needling    PT Next Visit Plan  hip strengthening, femoral control, manual techniques, modalities PRN    Consulted and Agree with Plan of Care  Patient       Patient will benefit from skilled therapeutic intervention in order to improve the following deficits and impairments:  Pain, Postural dysfunction, Improper body mechanics, Decreased strength, Decreased range of motion  Visit Diagnosis: Muscle weakness (generalized)  Pain  in right foot  Difficulty in walking, not elsewhere classified  Chronic pain of right knee  Chronic pain of left knee  Pain in left foot     Problem List Patient Active Problem List   Diagnosis Date Noted  . Arthritis of knee, degenerative 05/22/2018  . Cataract, right eye 05/17/2018  . Aortic stenosis, mild 02/22/2018  . Obesity (BMI 30.0-34.9) 01/18/2018  . Medication monitoring encounter 12/30/2015  . Productive cough 11/02/2015  . Skin lesion 08/04/2015  . 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 Hip pain, chronic 04/02/2015  . Hearing loss of both ears 04/02/2015  . Osteopenia of the elderly 04/02/2015  . Hormone replacement therapy (postmenopausal) 04/02/2015  . Aortic aneurysm without rupture ([place])  04/02/2015  . Situational anxiety 04/02/2015  . Dermatitis 04/02/2015  . Hyperlipidemia LDL goal <70 11/18/2014  . Incomplete bladder emptying 06/29/2014  . Mixed stress and urge urinary incontinence 06/29/2014  . Urge incontinence of urine 06/29/2014  . Hypertension goal BP (blood pressure) < 140/90 11/06/2013  . CAD (coronary artery disease) 11/06/2013   Joneen Boers PT, DPT   01/08/2019, 6:20 PM  [facility] [address], [place], [address] Phone: [phone]   Fax:  [phone]  Name: CORRY STORIE MRN: [id number] Date of Birth: Aug 30, 1930

## 2019-01-10 ENCOUNTER — Ambulatory Visit: Payer: Medicare Other

## 2019-01-10 ENCOUNTER — Other Ambulatory Visit: Payer: Self-pay

## 2019-01-10 DIAGNOSIS — R262 Difficulty in walking, not elsewhere classified: Secondary | ICD-10-CM

## 2019-01-10 DIAGNOSIS — M79671 Pain in right foot: Secondary | ICD-10-CM

## 2019-01-10 DIAGNOSIS — G8929 Other chronic pain: Secondary | ICD-10-CM

## 2019-01-10 DIAGNOSIS — M79672 Pain in left foot: Secondary | ICD-10-CM

## 2019-01-10 DIAGNOSIS — M6281 Muscle weakness (generalized): Secondary | ICD-10-CM

## 2019-01-10 DIAGNOSIS — M25561 Pain in right knee: Secondary | ICD-10-CM | POA: Diagnosis not present

## 2019-01-10 DIAGNOSIS — M25562 Pain in left knee: Secondary | ICD-10-CM | POA: Diagnosis not present

## 2019-01-10 NOTE — Therapy (Signed)
[facility] [address] [place], [address] Phone: [phone]   Fax:  [phone]  Physical Therapy Treatment  Patient Details  Name: Angel [name] MRN: [id number] Date of Birth: 1930/12/16 Referring Provider (PT): Enid Derry, MD   Encounter Date: 01/10/2019  PT End of Session - 01/10/19 1604    Visit Number  19    Number of Visits  37    Date for PT Re-Evaluation  02/07/19    Authorization Type  9    Authorization Time Period  of 10 progress report    PT Start Time  1604    PT Stop Time  1644    PT Time Calculation (min)  40 min    Activity Tolerance  Patient tolerated treatment well    Behavior During Therapy  [facility] for tasks assessed/performed       Past Medical History:  Diagnosis Date  . Anxiety   . Aortic aneurysm without rupture ([place])   . CAD (coronary artery disease)   . Chronic hip pain   . Endometriosis   . Hearing loss   . HTN, goal below 150/90   . Hyperlipidemia LDL goal <70 11/18/2014  . Osteopenia of the elderly   . Urge and stress incontinence     Past Surgical History:  Procedure Laterality Date  . ABDOMINAL HYSTERECTOMY  1976   total  . APPENDECTOMY    . BREAST EXCISIONAL BIOPSY Left 1987   benign    There were no vitals filed for this visit.  Subjective Assessment - 01/10/19 1606    Subjective  Knees and feet are doing ok today. Has been watching the positining of her knees and feet.  No pain in knees or feet currently.     Pertinent History  B knee pain since a year or more. Walking up the steps bothers her. Walking around regularly is no pain. Pt lives in a 2 story home, first floor set up. 14 steps to get to second floor, L banister rail starting a third of the way up.  No steps to enter front and back door.  Gradual onset of B knee pain. Has been to Dr. Marry Guan who did x-rays who said that there is still cartilage in both knees. Was diagnosed with osteoarthritis.  [place] Walking up hills bothers her knees.  Pt also walks about 20 minutes a day walking her dog.  R knee feels fragile when she starts with it to step up a step therefore she starts with her L, then she can step up with her R LE. Also goes to a chiropracter for adjustments.  Pt also has 5 step to go down to her meditation room, R rail assist in her home.  Has not yet used ice for her knees.   Pt adds that she has B foot pronation and plantar arch pain in which her orthotics that she wears helps her a lot.     Patient Stated Goals  Greater mobility, walk longer.     Currently in Pain?  No/denies    Pain Score  0-No pain    Pain Onset  More than a month ago                               PT Education - 01/10/19 1610  Education Details  ther-ex    Person(s) Educated  Patient    Methods  Explanation;Demonstration;Tactile cues;Verbal cues    Comprehension  Returned demonstration;Verbalized understanding         Objective  MedbridgeAccess Code: [email or web address]   No latex band allergies Blood pressure is controlled per pt.  Aortic aneurism osteopenia  Limited L ankle DF AROM ( to neutral, stiff end feel)      Manual therapy   Seated STM L vastus lateralis, lateral hamstrings and IT band             Therapeutic exercise  Seated R ankle IV yellow band 10x5 seconds for 3 sets  Seated hip ER resisting yellow band  R 10x3  L 10x3  Seated knee flexion targeting the medial hamstrings, yellow band  R 10x3  L 10x3  Seated manually resisted hip extension  R 10x3 L 10x3  Lateral step ups onto 4 inch step with one UE assist              R 10x             L 10x                  Therapeutic rest breaks secondary to fatigue and difficulty breathing with the mask     Improved exercise technique, movement at target joints, use of target  muscles after min to mod verbal, visual, tactile cues.   Response to treatment Good muscle use felt with exercises.Pt tolerated session well without aggravation of symptoms.   Clinical impression R foot pain seems to have subsided based on subjective reports of not pain for the past 3 sessions. Continued working on improving glute and medial hamstring strength bilaterally to promote better mechanics at her knees when performing standing activities. Slight increase in R knee pain with lateral step ups which decreased with PT cues for femoral control. Pt tolerated session well without aggravation of symptoms. Pt will benefit from continued skilled physical therapy services to decrease pain, improve LE strength, femoral control, and function.       PT Short Term Goals - 09/20/18 1905      PT SHORT TERM GOAL #1   Title  Patient will be independent with her HEP to decrease pain, improve strength and function.     Time  3    Period  Weeks    Status  On-going    Target Date  10/11/18        PT Long Term Goals - 12/25/18 1020      PT LONG TERM GOAL #1   Title  Patient will have a decrease in B knee pain to 2/10 or less at worst to decrease difficulty negotiating stairs or climbing up hills.     Baseline  R knee 5/10 , L knee 4.5/10 at worst for the past 3 months (07/16/2018); 6/10 R knee pain at most, 4.5/10 L knee pain at most for the past 7 days (09/20/2018); 3/10 B knee pain at worst for the past 7 days (09/27/2018); 3/10 B knee pain at worst for the past 2 weeks (12/25/2018)    Time  6    Period  Weeks    Status  Partially Met    Target Date  02/07/19      PT LONG TERM GOAL #2   Title  Patient will be able to ascend and descend 4 regular steps with bilateral UE assist at least 2 x with minimal to no complain  of B knee pain.     Baseline  Increased B knee pain with ascending and descending 4 regular steps with B UE assist (07/16/2018). Still has pain but feels slight improvement  (09/20/2018); increased B knee pain with stairs (12/25/2018)    Time  6    Period  Weeks    Status  On-going    Target Date  02/07/19      PT LONG TERM GOAL #3   Title  Patient will improve B hip extension and abduction strengh by at least 1/2 MMT to promote ability to negotiate stairs and climb hills with less pain.     Time  6    Period  Weeks    Status  On-going    Target Date  02/07/19      PT LONG TERM GOAL #4   Title  Patient will improve her 6 minute walk distance by at least 100 ft to promote mobility.     Baseline  1045 ft with L lateral hip pain/discomfort (07/16/2018); 1082 ft (09/20/2018); 910 ft (12/25/2018)    Time  6    Period  Weeks    Status  On-going    Target Date  02/07/19      PT LONG TERM GOAL #5   Title  Patient will have a decrease in R and L foot pain to 2/10 or less at worst to promote ability to ambulate with less difficulty.     Baseline  5/10 R foot pain, 3/10 L foot pain at most for the past 2 weeks (12/25/2018)    Time  6    Period  Weeks    Status  New    Target Date  02/07/19            Plan - 01/10/19 1612    Clinical Impression Statement  R foot pain seems to have subsided based on subjective reports of not pain for the past 3 sessions. Continued working on improving glute and medial hamstring strength bilaterally to promote better mechanics at her knees when performing standing activities. Slight increase in R knee pain with lateral step ups which decreased with PT cues for femoral control. Pt tolerated session well without aggravation of symptoms. Pt will benefit from continued skilled physical therapy services to decrease pain, improve LE strength, femoral control, and function.     Personal Factors and Comorbidities  Age;Comorbidity 2    Comorbidities  aortic aneurism, osteopenia    Examination-Activity Limitations  Carry;Squat;Stairs;Locomotion Level    Stability/Clinical Decision Making  Evolving/Moderate complexity    Rehab Potential  Fair     Clinical Impairments Affecting Rehab Potential  (-) age, chronicity of knee pain, hip weakenss. (+) motivated    PT Frequency  2x / week    PT Duration  6 weeks    PT Treatment/Interventions  Aquatic Therapy;Electrical Stimulation;Iontophoresis 62m/ml Dexamethasone;Gait training;Stair training;Therapeutic activities;Therapeutic exercise;Balance training;Neuromuscular re-education;Patient/family education;Manual techniques;Dry needling    PT Next Visit Plan  hip strengthening, femoral control, manual techniques, modalities PRN    Consulted and Agree with Plan of Care  Patient       Patient will benefit from skilled therapeutic intervention in order to improve the following deficits and impairments:  Pain, Postural dysfunction, Improper body mechanics, Decreased strength, Decreased range of motion  Visit Diagnosis: Muscle weakness (generalized)  Pain in right foot  Difficulty in walking, not elsewhere classified  Chronic pain of right knee  Chronic pain of left knee  Pain in left foot  Problem List Patient Active Problem List   Diagnosis Date Noted  . Arthritis of knee, degenerative 05/22/2018  . Cataract, right eye 05/17/2018  . Aortic stenosis, mild 02/22/2018  . Obesity (BMI 30.0-34.9) 01/18/2018  . Medication monitoring encounter 12/30/2015  . Productive cough 11/02/2015  . Skin lesion 08/04/2015  . 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 Hip pain, chronic 04/02/2015  . Hearing loss of both ears 04/02/2015  . Osteopenia of the elderly 04/02/2015  . Hormone replacement therapy (postmenopausal) 04/02/2015  . Aortic aneurysm without rupture ([place]) 04/02/2015  . Situational anxiety 04/02/2015  . Dermatitis 04/02/2015  . Hyperlipidemia LDL goal <70 11/18/2014  . Incomplete bladder emptying 06/29/2014  . Mixed stress and urge urinary incontinence 06/29/2014  . Urge incontinence of urine 06/29/2014  . Hypertension goal BP (blood pressure) < 140/90 11/06/2013  . CAD (coronary  artery disease) 11/06/2013   Joneen Boers PT, DPT  01/10/2019, 6:25 PM  [facility] [address], [place], [address] Phone: [phone]   Fax:  [phone]  Name: Angel [name] MRN: [id number] Date of Birth: 30-Mar-1931

## 2019-01-11 DIAGNOSIS — M9902 Segmental and somatic dysfunction of thoracic region: Secondary | ICD-10-CM | POA: Diagnosis not present

## 2019-01-11 DIAGNOSIS — M5134 Other intervertebral disc degeneration, thoracic region: Secondary | ICD-10-CM | POA: Diagnosis not present

## 2019-01-11 DIAGNOSIS — M5416 Radiculopathy, lumbar region: Secondary | ICD-10-CM | POA: Diagnosis not present

## 2019-01-11 DIAGNOSIS — M62830 Muscle spasm of back: Secondary | ICD-10-CM | POA: Diagnosis not present

## 2019-01-14 DIAGNOSIS — M5416 Radiculopathy, lumbar region: Secondary | ICD-10-CM | POA: Diagnosis not present

## 2019-01-14 DIAGNOSIS — M9902 Segmental and somatic dysfunction of thoracic region: Secondary | ICD-10-CM | POA: Diagnosis not present

## 2019-01-14 DIAGNOSIS — M5134 Other intervertebral disc degeneration, thoracic region: Secondary | ICD-10-CM | POA: Diagnosis not present

## 2019-01-14 DIAGNOSIS — M62830 Muscle spasm of back: Secondary | ICD-10-CM | POA: Diagnosis not present

## 2019-01-15 ENCOUNTER — Ambulatory Visit: Payer: Medicare Other

## 2019-01-15 NOTE — Telephone Encounter (Signed)
  Patient is calling back to check on getting an appt with Dr Angel [name]. She couldn't make an appointment until after her echo and her echo is scheduled for 02/04/19. I checked Dr Thompson Caul schedule and there were no openings. I also asked if she would see an APP and she would prefer Dr Angel [name]. She wanted me to check with the nurse to see if she could be seen since she feels she needs to be checked out.

## 2019-01-17 ENCOUNTER — Ambulatory Visit: Payer: Medicare Other

## 2019-01-17 ENCOUNTER — Telehealth: Payer: Self-pay | Admitting: Urology

## 2019-01-17 NOTE — Telephone Encounter (Signed)
Attempted to contact pt.  Phone rang several times with no answer and no VM.  Will try again later. °

## 2019-01-17 NOTE — Telephone Encounter (Signed)
LM TO CB TO DISCUSS RESTARTING PTNS PT WILL NEED A VIRTUAL APP WITH [facility]   01-17-19 MICHELLE

## 2019-01-18 ENCOUNTER — Telehealth: Payer: Self-pay | Admitting: Interventional Cardiology

## 2019-01-18 NOTE — Telephone Encounter (Signed)
Resending, I sent call to the wrong [facility]

## 2019-01-18 NOTE — Telephone Encounter (Signed)
Left message to call back.  Was going to offer appt on 7/8 at 820A, 10A or 320P, whichever was still available.  Pt just needs to be scheduled with understanding, pending virus, this appt may be switched to virtual.

## 2019-01-18 NOTE — Telephone Encounter (Signed)
New message:     Patient calling concering getting a sooner appt. Please call patient back.

## 2019-01-18 NOTE — Telephone Encounter (Signed)
Left message to call back  

## 2019-01-21 DIAGNOSIS — M5134 Other intervertebral disc degeneration, thoracic region: Secondary | ICD-10-CM | POA: Diagnosis not present

## 2019-01-21 DIAGNOSIS — M5416 Radiculopathy, lumbar region: Secondary | ICD-10-CM | POA: Diagnosis not present

## 2019-01-21 DIAGNOSIS — M62830 Muscle spasm of back: Secondary | ICD-10-CM | POA: Diagnosis not present

## 2019-01-21 DIAGNOSIS — M9902 Segmental and somatic dysfunction of thoracic region: Secondary | ICD-10-CM | POA: Diagnosis not present

## 2019-01-21 NOTE — Telephone Encounter (Signed)
Pt called back and scheduled appt

## 2019-01-22 ENCOUNTER — Other Ambulatory Visit: Payer: Self-pay

## 2019-01-22 ENCOUNTER — Ambulatory Visit: Payer: Medicare Other

## 2019-01-22 DIAGNOSIS — M25561 Pain in right knee: Secondary | ICD-10-CM

## 2019-01-22 DIAGNOSIS — M79672 Pain in left foot: Secondary | ICD-10-CM

## 2019-01-22 DIAGNOSIS — M6281 Muscle weakness (generalized): Secondary | ICD-10-CM

## 2019-01-22 DIAGNOSIS — G8929 Other chronic pain: Secondary | ICD-10-CM

## 2019-01-22 DIAGNOSIS — R262 Difficulty in walking, not elsewhere classified: Secondary | ICD-10-CM | POA: Diagnosis not present

## 2019-01-22 DIAGNOSIS — M25562 Pain in left knee: Secondary | ICD-10-CM | POA: Diagnosis not present

## 2019-01-22 DIAGNOSIS — M79671 Pain in right foot: Secondary | ICD-10-CM

## 2019-01-22 NOTE — Therapy (Signed)
[facility] [address] [place], [address] Phone: [phone]   Fax:  [phone]  Physical Therapy Treatment And Progress Report  (12/25/2018 - 01/22/2019)  Patient Details  Name: Angel [name] MRN: [id number] Date of Birth: Mar 30, 1931 Referring Provider (PT): Enid Derry, MD   Encounter Date: 01/22/2019  PT End of Session - 01/22/19 1606    Visit Number  10    Number of Visits  37    Date for PT Re-Evaluation  02/07/19    Authorization Type  10    Authorization Time Period  of 10 progress report    PT Start Time  1608    PT Stop Time  1652    PT Time Calculation (min)  44 min    Activity Tolerance  Patient tolerated treatment well    Behavior During Therapy  [facility] for tasks assessed/performed       Past Medical History:  Diagnosis Date  . Anxiety   . Aortic aneurysm without rupture ([place])   . CAD (coronary artery disease)   . Chronic hip pain   . Endometriosis   . Hearing loss   . HTN, goal below 150/90   . Hyperlipidemia LDL goal <70 11/18/2014  . Osteopenia of the elderly   . Urge and stress incontinence     Past Surgical History:  Procedure Laterality Date  . ABDOMINAL HYSTERECTOMY  1976   total  . APPENDECTOMY    . BREAST EXCISIONAL BIOPSY Left 1987   benign    There were no vitals filed for this visit.  Subjective Assessment - 01/22/19 1610    Subjective  No B knee pain. Currently have B foot pain due to the rain. R posterior lateral plantar foot pain around the heel 4.5/10 when walking.Pt states that she feels like she has made progress and feels encouraged.    Pertinent History  B knee pain since a year or more. Walking up the steps bothers her. Walking around regularly is no pain. Pt lives in a 2 story home, first floor set up. 14 steps to get to second floor, L banister rail starting a third of the way up.  No steps to enter front and back door.  Gradual onset of B knee pain.  Has been to Dr. Marry Guan who did x-rays who said that there is still cartilage in both knees. Was diagnosed with osteoarthritis.  [place]. Walking up hills bothers her knees.  Pt also walks about 20 minutes a day walking her dog.  R knee feels fragile when she starts with it to step up a step therefore she starts with her L, then she can step up with her R LE. Also goes to a chiropracter for adjustments.  Pt also has 5 step to go down to her meditation room, R rail assist in her home.  Has not yet used ice for her knees.   Pt adds that she has B foot pronation and plantar arch pain in which her orthotics that she wears helps her a lot.     Patient Stated Goals  Greater mobility, walk longer.     Currently in Pain?  Yes    Pain Score  5    R foot pain.   Pain Onset  More than a month ago         [facility] PT Assessment - 01/22/19 1615  Strength   Right Hip Extension  4+/5   seated manually resisted hip extension   Right Hip ABduction  4+/5   seated manually resisted clamshell   Left Hip Extension  4+/5   seated manually resisted hip extension   Left Hip ABduction  4+/5   seated manually resisted clamshell                          PT Education - 01/22/19 1618    Education Details  ther-ex    Person(s) Educated  Patient    Methods  Explanation;Demonstration;Tactile cues;Verbal cues    Comprehension  Returned demonstration;Verbalized understanding       Objective  MedbridgeAccess Code: [email or web address]   No latex band allergies Blood pressure is controlled per pt.  Aortic aneurism osteopenia  Limited L ankle DF AROM ( to neutral, stiff end feel)    Therapeutic exercise  Seated manually resisted hip extension, clamshell isometrics each LE 1x each LE  Reviewed progress/current status with strength with pt.   Gait x 6 minutes   1008 ft   Reviewed progress/current status  with PT towards goals.   Ascending and descending 4 regular steps with B UE assist 2x, emphasis on femoral control   Small to medium level of pain.   Standing mini squats 6x  Seated hip ER resisting yellow band             R 10x3             L 10x3  Seated clamshell isometrics, hips less than 90 degrees flexion, belt resistance   10x5 seconds for 3 sets  Seated R ankle IV yellow band 10x5 seconds for 3 sets  Rest breaks secondary to fatigue.     Improved exercise technique, movement at target joints, use of target muscles after min to mod verbal, visual, tactile cues.   Response to treatment Good muscle use felt with exercises.Pt tolerated session well without aggravation of symptoms.Decreased R foot pain to 1.5/10 after session.  Clinical impression Pt deomonstrates improved B hip strength, decreased B knee pain, and improved distance walked in 6 minutes since last progress report on 12/25/2018. Continued working on glute strength, femoral control and decreasing R lateral ankle pressure. Decreased R foot pain to 1.5/10 after session. Pt still demonstrates weakness, B knee and foot pain, and difficulty performing tasks such as stair negotiation and would benefit from continued skilled physical therapy services to address the aforementioned deficits.            PT Short Term Goals - 09/20/18 1905      PT SHORT TERM GOAL #1   Title  Patient will be independent with her HEP to decrease pain, improve strength and function.     Time  3    Period  Weeks    Status  On-going    Target Date  10/11/18        PT Long Term Goals - 01/22/19 1611      PT LONG TERM GOAL #1   Title  Patient will have a decrease in B knee pain to 2/10 or less at worst to decrease difficulty negotiating stairs or climbing up hills.     Baseline  R knee 5/10 , L knee 4.5/10 at worst for the past 3 months (07/16/2018); 6/10 R knee pain at most, 4.5/10 L knee pain at most for the past 7 days  (09/20/2018); 3/10 B knee pain at worst for  the past 7 days (09/27/2018); 3/10 B knee pain at worst for the past 2 weeks (12/25/2018). 1.5/10 R knee pain (except with stairs), 1.5/10 L knee pain at most for the past 7 days except with steps/stairs (01/22/2019)    Time  6    Period  Weeks    Status  Partially Met    Target Date  02/07/19      PT LONG TERM GOAL #2   Title  Patient will be able to ascend and descend 4 regular steps with bilateral UE assist at least 2 x with minimal to no complain of B knee pain.     Baseline  Increased B knee pain with ascending and descending 4 regular steps with B UE assist (07/16/2018). Still has pain but feels slight improvement (09/20/2018); increased B knee pain with stairs (12/25/2018), (01/22/2019)    Time  6    Period  Weeks    Status  On-going    Target Date  02/07/19      PT LONG TERM GOAL #3   Title  Patient will improve B hip extension and abduction strengh by at least 1/2 MMT to promote ability to negotiate stairs and climb hills with less pain.     Time  6    Period  Weeks    Status  On-going    Target Date  02/07/19      PT LONG TERM GOAL #4   Title  Patient will improve her 6 minute walk distance by at least 100 ft to promote mobility.     Baseline  1045 ft with L lateral hip pain/discomfort (07/16/2018); 1082 ft (09/20/2018); 910 ft (12/25/2018); 1008 ft, no pain or discomfort (01/22/2019)    Time  6    Period  Weeks    Status  On-going    Target Date  02/07/19      PT LONG TERM GOAL #5   Title  Patient will have a decrease in R and L foot pain to 2/10 or less at worst to promote ability to ambulate with less difficulty.     Baseline  5/10 R foot pain, 3/10 L foot pain at most for the past 2 weeks (12/25/2018); 3.5/10 R foot and L foot pain at most for the past 7 days (01/22/2019)    Time  6    Period  Weeks    Status  On-going    Target Date  02/07/19            Plan - 01/22/19 1605    Clinical Impression Statement  Pt deomonstrates  improved B hip strength, decreased B knee pain, and improved distance walked in 6 minutes since last progress report on 12/25/2018. Continued working on glute strength, femoral control and decreasing R lateral ankle pressure. Decreased R foot pain to 1.5/10 after session. Pt still demonstrates weakness, B knee and foot pain, and difficulty performing tasks such as stair negotiation and would benefit from continued skilled physical therapy services to address the aforementioned deficits.    Personal Factors and Comorbidities  Age;Comorbidity 2    Comorbidities  aortic aneurism, osteopenia    Examination-Activity Limitations  Carry;Squat;Stairs;Locomotion Level    Stability/Clinical Decision Making  Evolving/Moderate complexity    Rehab Potential  Fair    Clinical Impairments Affecting Rehab Potential  (-) age, chronicity of knee pain, hip weakenss. (+) motivated    PT Frequency  2x / week    PT Duration  6 weeks    PT Treatment/Interventions  Aquatic Therapy;Electrical Stimulation;Iontophoresis 68m/ml Dexamethasone;Gait training;Stair training;Therapeutic activities;Therapeutic exercise;Balance training;Neuromuscular re-education;Patient/family education;Manual techniques;Dry needling    PT Next Visit Plan  hip strengthening, femoral control, manual techniques, modalities PRN    Consulted and Agree with Plan of Care  Patient       Patient will benefit from skilled therapeutic intervention in order to improve the following deficits and impairments:  Pain, Postural dysfunction, Improper body mechanics, Decreased strength, Decreased range of motion  Visit Diagnosis: 1. Muscle weakness (generalized)   2. Pain in right foot   3. Difficulty in walking, not elsewhere classified   4. Chronic pain of right knee   5. Chronic pain of left knee   6. Pain in left foot        Problem List Patient Active Problem List   Diagnosis Date Noted  . Arthritis of knee, degenerative 05/22/2018  . Cataract,  right eye 05/17/2018  . Aortic stenosis, mild 02/22/2018  . Obesity (BMI 30.0-34.9) 01/18/2018  . Medication monitoring encounter 12/30/2015  . Productive cough 11/02/2015  . Skin lesion 08/04/2015  . 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 Hip pain, chronic 04/02/2015  . Hearing loss of both ears 04/02/2015  . Osteopenia of the elderly 04/02/2015  . Hormone replacement therapy (postmenopausal) 04/02/2015  . [place] 04/02[place]  . Situational anxiety 04/02/2015  . Dermatitis 04/02/2015  . Hyperlipidemia LDL goal <70 11/18/2014  . Incomplete bladder emptying 06/29/2014  . Mixed stress and urge urinary incontinence 06/29/2014  . Urge incontinence of urine 06/29/2014  . Hypertension goal BP (blood pressure) < 140/90 11/06/2013  . CAD (coronary artery disease) 11/06/2013    Thank you for your referral.  MJoneen BoersPT, DPT    01/22/2019, 5:26 PM   [facility] [address] [place] [address]Phone: [phone]  Fax:  [phone] Name: CMARQUE BANGOMRN: [id number] of Birth: [birth year]

## 2019-01-24 ENCOUNTER — Ambulatory Visit: Payer: Medicare Other

## 2019-01-24 ENCOUNTER — Other Ambulatory Visit: Payer: Self-pay

## 2019-01-24 DIAGNOSIS — M6281 Muscle weakness (generalized): Secondary | ICD-10-CM

## 2019-01-24 DIAGNOSIS — G8929 Other chronic pain: Secondary | ICD-10-CM

## 2019-01-24 DIAGNOSIS — M79671 Pain in right foot: Secondary | ICD-10-CM | POA: Diagnosis not present

## 2019-01-24 DIAGNOSIS — M25562 Pain in left knee: Secondary | ICD-10-CM | POA: Diagnosis not present

## 2019-01-24 DIAGNOSIS — M25561 Pain in right knee: Secondary | ICD-10-CM | POA: Diagnosis not present

## 2019-01-24 DIAGNOSIS — R262 Difficulty in walking, not elsewhere classified: Secondary | ICD-10-CM

## 2019-01-24 NOTE — Therapy (Signed)
[facility] [address] [place], [address] Phone: [phone]   Fax:  [phone]  Physical Therapy Treatment  Patient Details  Name: Angel [name] MRN: [id number] Date of Birth: 07/21/1931 Referring Provider (PT): Enid Derry, MD   Encounter Date: 01/24/2019  PT End of Session - 01/24/19 1602    Visit Number  11    Number of Visits  37    Date for PT Re-Evaluation  02/07/19    Authorization Type  1    Authorization Time Period  of 10 progress report    PT Start Time  1601    PT Stop Time  1645    PT Time Calculation (min)  44 min    Activity Tolerance  Patient tolerated treatment well    Behavior During Therapy  [facility] for tasks assessed/performed       Past Medical History:  Diagnosis Date  . Anxiety   . Aortic aneurysm without rupture ([place])   . CAD (coronary artery disease)   . Chronic hip pain   . Endometriosis   . Hearing loss   . HTN, goal below 150/90   . Hyperlipidemia LDL goal <70 11/18/2014  . Osteopenia of the elderly   . Urge and stress incontinence     Past Surgical History:  Procedure Laterality Date  . ABDOMINAL HYSTERECTOMY  1976   total  . APPENDECTOMY    . BREAST EXCISIONAL BIOPSY Left 1987   benign    There were no vitals filed for this visit.  Subjective Assessment - 01/24/19 1603    Subjective  Things are moving just fine. The only pain is when she has to step up onto a curb with her R foot. No R foot pain currently. No B knee pain currently when walking.    Pertinent History  B knee pain since a year or more. Walking up the steps bothers her. Walking around regularly is no pain. Pt lives in a 2 story home, first floor set up. 14 steps to get to second floor, L banister rail starting a third of the way up.  No steps to enter front and back door.  Gradual onset of B knee pain. Has been to Dr. Marry Guan who did x-rays who said that there is still cartilage in both knees. Was  diagnosed with osteoarthritis.  [place]. Walking up hills bothers her knees.  Pt also walks about 20 minutes a day walking her dog.  R knee feels fragile when she starts with it to step up a step therefore she starts with her L, then she can step up with her R LE. Also goes to a chiropracter for adjustments.  Pt also has 5 step to go down to her meditation room, R rail assist in her home.  Has not yet used ice for her knees.   Pt adds that she has B foot pronation and plantar arch pain in which her orthotics that she wears helps her a lot.     Patient Stated Goals  Greater mobility, walk longer.     Currently in Pain?  No/denies    Pain Score  0-No pain    Pain Onset  More than a month ago  PT Education - 01/24/19 1626    Education Details  ther-ex    Person(s) Educated  Patient    Methods  Explanation;Tactile cues;Demonstration;Verbal cues    Comprehension  Returned demonstration;Verbalized understanding      Objective  MedbridgeAccess Code: [email or web address]   No latex band allergies Blood pressure is controlled per pt.  Aortic aneurism osteopenia  Pt demonstrates B foot pronation. Pt states wearing orthotics    Pt states that she got a letter from Dr. Sanda Klein stating that she is leaving the practice due to health issues. Pt was recommended to see their nurse practitioner [name]   Therapeutic exercise  Seated manually resisted hamstrings flexion targeting the medial hamstrings  R 10x5 seconds for 2 sets  L 10x5 seconds for 2 sets   Seated R ankle IVmanually resisted isometrics10x5 seconds for 3 sets  Seated manually resisted hip extension   R 10x3  L 10x3  Seated clamshell isometrics, hips less than 90 degrees flexion, manual resistance              10x5 seconds for 3 sets    Rest breaks secondary to fatigue.      Improved exercise technique, movement at target joints, use of target muscles after min to mod verbal, visual, tactile cues.   Manual therapy  Seated STM R lateral hamstrings Seated STM R iliotibial band Seated STM R vastus lateralis    Response to treatment Good muscle use felt with exercises.Pt tolerated session well without aggravation of symptoms.    Clinical impression Good carry over of decreased R foot pain from previous session with pt stating no R foot pain today. Continued working on improving B glute med and max strength, improving medial hamstrings strength to promote femoral control and improved mechanics at her knees and feet. Continued working on decreasing tension to lateral tissues of her R knee to help promote better mechanics at her knee joint and patella as well. No complain of increased pain throughout session. Pt will benefit from continued skilled physical therapy services to decrease pain, improve LE strength, mechanics, and function.        PT Short Term Goals - 09/20/18 1905      PT SHORT TERM GOAL #1   Title  Patient will be independent with her HEP to decrease pain, improve strength and function.     Time  3    Period  Weeks    Status  On-going    Target Date  10/11/18        PT Long Term Goals - 01/22/19 1611      PT LONG TERM GOAL #1   Title  Patient will have a decrease in B knee pain to 2/10 or less at worst to decrease difficulty negotiating stairs or climbing up hills.     Baseline  R knee 5/10 , L knee 4.5/10 at worst for the past 3 months (07/16/2018); 6/10 R knee pain at most, 4.5/10 L knee pain at most for the past 7 days (09/20/2018); 3/10 B knee pain at worst for the past 7 days (09/27/2018); 3/10 B knee pain at worst for the past 2 weeks (12/25/2018). 1.5/10 R knee pain (except with stairs), 1.5/10 L knee pain at most for the past 7 days except with steps/stairs (01/22/2019)    Time  6    Period  Weeks    Status  Partially  Met    Target Date  02/07/19      PT LONG TERM  GOAL #2   Title  Patient will be able to ascend and descend 4 regular steps with bilateral UE assist at least 2 x with minimal to no complain of B knee pain.     Baseline  Increased B knee pain with ascending and descending 4 regular steps with B UE assist (07/16/2018). Still has pain but feels slight improvement (09/20/2018); increased B knee pain with stairs (12/25/2018), (01/22/2019)    Time  6    Period  Weeks    Status  On-going    Target Date  02/07/19      PT LONG TERM GOAL #3   Title  Patient will improve B hip extension and abduction strengh by at least 1/2 MMT to promote ability to negotiate stairs and climb hills with less pain.     Time  6    Period  Weeks    Status  On-going    Target Date  02/07/19      PT LONG TERM GOAL #4   Title  Patient will improve her 6 minute walk distance by at least 100 ft to promote mobility.     Baseline  1045 ft with L lateral hip pain/discomfort (07/16/2018); 1082 ft (09/20/2018); 910 ft (12/25/2018); 1008 ft, no pain or discomfort (01/22/2019)    Time  6    Period  Weeks    Status  On-going    Target Date  02/07/19      PT LONG TERM GOAL #5   Title  Patient will have a decrease in R and L foot pain to 2/10 or less at worst to promote ability to ambulate with less difficulty.     Baseline  5/10 R foot pain, 3/10 L foot pain at most for the past 2 weeks (12/25/2018); 3.5/10 R foot and L foot pain at most for the past 7 days (01/22/2019)    Time  6    Period  Weeks    Status  On-going    Target Date  02/07/19            Plan - 01/24/19 1627    Clinical Impression Statement  Good carry over of decreased R foot pain from previous session with pt stating no R foot pain today. Continued working on improving B glute med and max strength, improving medial hamstrings strength to promote femoral control and improved mechanics at her knees and feet. Continued working on decreasing tension to lateral  tissues of her R knee to help promote better mechanics at her knee joint and patella as well. No complain of increased pain throughout session. Pt will benefit from continued skilled physical therapy services to decrease pain, improve LE strength, mechanics, and function.    Personal Factors and Comorbidities  Age;Comorbidity 2    Comorbidities  aortic aneurism, osteopenia    Examination-Activity Limitations  Carry;Squat;Stairs;Locomotion Level    Stability/Clinical Decision Making  Evolving/Moderate complexity    Rehab Potential  Fair    Clinical Impairments Affecting Rehab Potential  (-) age, chronicity of knee pain, hip weakenss. (+) motivated    PT Frequency  2x / week    PT Duration  6 weeks    PT Treatment/Interventions  Aquatic Therapy;Electrical Stimulation;Iontophoresis 69m/ml Dexamethasone;Gait training;Stair training;Therapeutic activities;Therapeutic exercise;Balance training;Neuromuscular re-education;Patient/family education;Manual techniques;Dry needling    PT Next Visit Plan  hip strengthening, femoral control, manual techniques, modalities PRN    Consulted and Agree with Plan of Care  Patient       Patient will benefit from  skilled therapeutic intervention in order to improve the following deficits and impairments:  Pain, Postural dysfunction, Improper body mechanics, Decreased strength, Decreased range of motion  Visit Diagnosis: 1. Muscle weakness (generalized)   2. Pain in right foot   3. Difficulty in walking, not elsewhere classified   4. Chronic pain of right knee   5. Chronic pain of left knee        Problem List Patient Active Problem List   Diagnosis Date Noted  . Arthritis of knee, degenerative 05/22/2018  . Cataract, right eye 05/17/2018  . Aortic stenosis, mild 02/22/2018  . Obesity (BMI 30.0-34.9) 01/18/2018  . Medication monitoring encounter 12/30/2015  . Productive cough 11/02/2015  . Skin lesion 08/04/2015  . 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 Hip  pain, chronic 04/02/2015  . Hearing loss of both ears 04/02/2015  . Osteopenia of the elderly 04/02/2015  . Hormone replacement therapy (postmenopausal) 04/02/2015  . Aortic aneurysm without rupture ([place]) 04/02/2015  . Situational anxiety 04/02/2015  . Dermatitis 04/02/2015  . Hyperlipidemia LDL goal <70 11/18/2014  . Incomplete bladder emptying 06/29/2014  . Mixed stress and urge urinary incontinence 06/29/2014  . Urge incontinence of urine 06/29/2014  . Hypertension goal BP (blood pressure) < 140/90 11/06/2013  . CAD (coronary artery disease) 11/06/2013     Joneen Boers PT, DPT   01/24/2019, 5:03 PM  [facility] [address], [place], [address] Phone: [phone]   Fax:  [phone]  Name: Angel [name] MRN: [id number] Date of Birth: Jun 08, 1931

## 2019-01-28 ENCOUNTER — Telehealth: Payer: Self-pay | Admitting: Family Medicine

## 2019-01-28 DIAGNOSIS — M9902 Segmental and somatic dysfunction of thoracic region: Secondary | ICD-10-CM | POA: Diagnosis not present

## 2019-01-28 DIAGNOSIS — M62830 Muscle spasm of back: Secondary | ICD-10-CM | POA: Diagnosis not present

## 2019-01-28 DIAGNOSIS — M5134 Other intervertebral disc degeneration, thoracic region: Secondary | ICD-10-CM | POA: Diagnosis not present

## 2019-01-28 DIAGNOSIS — M5416 Radiculopathy, lumbar region: Secondary | ICD-10-CM | POA: Diagnosis not present

## 2019-01-28 MED ORDER — ESTROGENS CONJUGATED 0.3 MG PO TABS: ORAL_TABLET | ORAL | 0 refills | Status: DC

## 2019-01-28 NOTE — Telephone Encounter (Signed)
appt scheduled with Benjamine Mola

## 2019-01-28 NOTE — Telephone Encounter (Signed)
Pt is calling back to see when refill will be done.  Please call [phone]

## 2019-01-28 NOTE — Telephone Encounter (Signed)
Medication: estrogens, conjugated, (PREMARIN) 0.3 MG tablet [[id number]]   Has the patient contacted their pharmacy? Yes  (Agent: If no, request that the patient contact the pharmacy for the refill.) (Agent: If yes, when and what did the pharmacy advise?)  Preferred Pharmacy (with phone number or street name): [id number] - [place], [place] [address] Phone: [phone] Fax: [phone]    Agent: Please be advised that RX refills may take up to 3 business days. We ask that you follow-up with your pharmacy.

## 2019-01-28 NOTE — Telephone Encounter (Signed)
Please schedule patient for follow up in the next 30 days.  

## 2019-01-29 ENCOUNTER — Other Ambulatory Visit: Payer: Self-pay

## 2019-01-29 ENCOUNTER — Telehealth (INDEPENDENT_AMBULATORY_CARE_PROVIDER_SITE_OTHER): Payer: Medicare Other | Admitting: Urology

## 2019-01-29 ENCOUNTER — Ambulatory Visit: Payer: Medicare Other

## 2019-01-29 ENCOUNTER — Telehealth: Payer: Self-pay | Admitting: Urology

## 2019-01-29 DIAGNOSIS — N3946 Mixed incontinence: Secondary | ICD-10-CM

## 2019-01-29 NOTE — Telephone Encounter (Signed)
Mrs. Angel [name] would like to restart her PTNS (12 weekly treatments, followed by monthly maintenance) in September as she does not have time to start the treatments again due to a book deadline.

## 2019-01-29 NOTE — Progress Notes (Signed)
Virtual Visit via Telephone Note  I connected with BRITTNAY PIGMAN on 01/29/19 at 1446 by telephone and verified that I am speaking with the correct person using two identifiers.  They are located at home  I am located at my home.    This visit type was conducted due to national recommendations for restrictions regarding the COVID-19 Pandemic (e.g. social distancing).  This format is felt to be most appropriate for this patient at this time.  All issues noted in this document were discussed and addressed.  No physical exam was performed.   I discussed the limitations, risks, security and privacy concerns of performing an evaluation and management service by telephone and the availability of in person appointments. I also discussed with the patient that there may be a patient responsible charge related to this service. The patient expressed understanding and agreed to proceed.   History of Present Illness: Mrs. Quraishi is an 85 year old female with mixed incontinence and nocturia who is contacted today via telephone to discuss her PTNS treatments.  She had completed her fourth PTNS treatment prior to the COVID-19 pandemic.  She felt that the treatments were starting to take effect as she noted a reduction in her incontinence.  She would like to know what her options are at this time.  Patient denies any gross hematuria, dysuria or suprapubic/flank pain.  Patient denies any fevers, chills, nausea or vomiting.    Observations/Objective: Mrs. Bernardi does not sound distressed and is answering questions appropriately.    Assessment and Plan:  1. Mixed incontinence We discussed restarting the PTNS treatments at the beginning and doing 12 weekly treatments as she did not get to her sixth treatment.  She is agreeable to this, but she would like to wait until the fall to start.  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       Follow Up Instructions:  Mrs. Haertel will return in September to begin 12 weekly treatments of PTNS.     I discussed the assessment and treatment plan with the patient. The patient was provided an opportunity to ask questions and all were answered. The patient agreed with the plan and demonstrated an understanding of the instructions.   The patient was advised to call back or seek an in-person evaluation if the symptoms worsen or if the condition fails to improve as anticipated.  I provided 10 minutes of non-face-to-face time during this encounter.   Evander Macaraeg, PA-C

## 2019-01-30 NOTE — Telephone Encounter (Signed)
LM TO CB TO SCHEDULE PTNS STARTING IN September  MICHELLE

## 2019-01-31 ENCOUNTER — Ambulatory Visit: Payer: Medicare Other

## 2019-01-31 ENCOUNTER — Other Ambulatory Visit: Payer: Self-pay

## 2019-01-31 DIAGNOSIS — M79671 Pain in right foot: Secondary | ICD-10-CM

## 2019-01-31 DIAGNOSIS — M6281 Muscle weakness (generalized): Secondary | ICD-10-CM | POA: Diagnosis not present

## 2019-01-31 DIAGNOSIS — G8929 Other chronic pain: Secondary | ICD-10-CM | POA: Diagnosis not present

## 2019-01-31 DIAGNOSIS — M25562 Pain in left knee: Secondary | ICD-10-CM | POA: Diagnosis not present

## 2019-01-31 DIAGNOSIS — R262 Difficulty in walking, not elsewhere classified: Secondary | ICD-10-CM | POA: Diagnosis not present

## 2019-01-31 DIAGNOSIS — M79672 Pain in left foot: Secondary | ICD-10-CM

## 2019-01-31 DIAGNOSIS — M25561 Pain in right knee: Secondary | ICD-10-CM | POA: Diagnosis not present

## 2019-01-31 NOTE — Therapy (Signed)
[facility] [address] [place], [address] Phone: [phone]   Fax:  [phone]  Physical Therapy Treatment  Patient Details  Name: Angel [name] MRN: [id number] Date of Birth: Jul 28, 1931 Referring Provider (PT): Enid Derry, MD   Encounter Date: 01/31/2019  PT End of Session - 01/31/19 1605    Visit Number  12    Number of Visits  37    Date for PT Re-Evaluation  02/07/19    Authorization Type  2    Authorization Time Period  of 10 progress report    PT Start Time  1605    PT Stop Time  1648    PT Time Calculation (min)  43 min    Activity Tolerance  Patient tolerated treatment well    Behavior During Therapy  [facility] for tasks assessed/performed       Past Medical History:  Diagnosis Date  . Anxiety   . Aortic aneurysm without rupture ([place])   . CAD (coronary artery disease)   . Chronic hip pain   . Endometriosis   . Hearing loss   . HTN, goal below 150/90   . Hyperlipidemia LDL goal <70 11/18/2014  . Osteopenia of the elderly   . Urge and stress incontinence     Past Surgical History:  Procedure Laterality Date  . ABDOMINAL HYSTERECTOMY  1976   total  . APPENDECTOMY    . BREAST EXCISIONAL BIOPSY Left 1987   benign    There were no vitals filed for this visit.  Subjective Assessment - 01/31/19 1607    Subjective  No knee and foot pain currently.Pt states that getting up from the couch is hard for her.    Pertinent History  B knee pain since a year or more. Walking up the steps bothers her. Walking around regularly is no pain. Pt lives in a 2 story home, first floor set up. 14 steps to get to second floor, L banister rail starting a third of the way up.  No steps to enter front and back door.  Gradual onset of B knee pain. Has been to Dr. Marry Guan who did x-rays who said that there is still cartilage in both knees. Was diagnosed with osteoarthritis.  [place]. Walking up hills bothers her knees.  Pt also walks about 20 minutes a day walking her dog.  R knee feels fragile when she starts with it to step up a step therefore she starts with her L, then she can step up with her R LE. Also goes to a chiropracter for adjustments.  Pt also has 5 step to go down to her meditation room, R rail assist in her home.  Has not yet used ice for her knees.   Pt adds that she has B foot pronation and plantar arch pain in which her orthotics that she wears helps her a lot.     Patient Stated Goals  Greater mobility, walk longer.     Currently in Pain?  No/denies    Pain Score  0-No pain    Pain Onset  More than a month ago                               PT Education - 01/31/19 1628    Education Details  ther-ex    Person(s)  Educated  Patient    Methods  Explanation;Demonstration;Tactile cues;Verbal cues    Comprehension  Returned demonstration;Verbalized understanding      Objective  MedbridgeAccess Code: [email or web address]   No latex band allergies Blood pressure is controlled per pt.  Aortic aneurism osteopenia  Pt demonstrates B foot pronation. Pt states wearing orthotics   Pt states that she got a letter from Dr. Sanda Klein stating that she is leaving the practice due to health issues. Pt was recommended to see their nurse practitioner Raelyn Ensign FNP-Angel   Pt states that getting up from the couch is hard for her.  Therapeutic exercise  Forward step up onto 4 inch step with one UE assist  R LE 10x  L LE 10x  Forward step up onto and over 4 inch step with one UE assist  R LE 10x  L LE 10x  Slight medial knee discomfort bilaterally which eases with rest  Forward weight shifting onto step with riser with one UE assist, pain-free ROM  R LE 10x  L LE 10x  B medial knee discomfort which eases with rest.   Seated manually resisted hamstrings flexion targeting the medial  hamstrings             R 10x5 seconds for 2 sets             L 10x5 seconds for 2 sets  Stand to sit onto low mat table without UE assist (UE assist to stand)   3x B knee pain  Then using elevated mat table 8x. B knee pain. Less knee pain when cued to place and maintain center of gravity over base of support       Improved exercise technique, movement at target joints, use of target muscles after min to mod verbal, visual, tactile cues.   Manual therapy  Seated STM R lateral hamstrings Seated STM R iliotibial band Seated STM R vastus lateralis    Response to treatment Good muscle use felt with exercises. B medial knee pain with step ups and sit <> stand exercises, eases with rest. No knee or foot pain with gait after session.     Clinical impression Added step up as well as sit <> stand related exercises to promote strength to help decrease difficulty with stair negotiation and standing up from her couch. Pt demonstrates medial knee pain bilaterally with the exercises but decreases with rest. Continued working on medial hamstring strengthening as well as manual therapy to decrease soft tissue tension lateral knee to promote better mechanics at her R knee with closed chain tasks. Pt tolerated session well without aggravation of symptoms. Pt will benefit from continued skilled physical therapy services to decrease pain, improve strength and function.     PT Short Term Goals - 09/20/18 1905      PT SHORT TERM GOAL #1   Title  Patient will be independent with her HEP to decrease pain, improve strength and function.     Time  3    Period  Weeks    Status  On-going    Target Date  10/11/18        PT Long Term Goals - 01/22/19 1611      PT LONG TERM GOAL #1   Title  Patient will have a decrease in B knee pain to 2/10 or less at worst to decrease difficulty negotiating stairs or climbing up hills.     Baseline  R knee 5/10 , L knee 4.5/10 at worst for the past  3  months (07/16/2018); 6/10 R knee pain at most, 4.5/10 L knee pain at most for the past 7 days (09/20/2018); 3/10 B knee pain at worst for the past 7 days (09/27/2018); 3/10 B knee pain at worst for the past 2 weeks (12/25/2018). 1.5/10 R knee pain (except with stairs), 1.5/10 L knee pain at most for the past 7 days except with steps/stairs (01/22/2019)    Time  6    Period  Weeks    Status  Partially Met    Target Date  02/07/19      PT LONG TERM GOAL #2   Title  Patient will be able to ascend and descend 4 regular steps with bilateral UE assist at least 2 x with minimal to no complain of B knee pain.     Baseline  Increased B knee pain with ascending and descending 4 regular steps with B UE assist (07/16/2018). Still has pain but feels slight improvement (09/20/2018); increased B knee pain with stairs (12/25/2018), (01/22/2019)    Time  6    Period  Weeks    Status  On-going    Target Date  02/07/19      PT LONG TERM GOAL #3   Title  Patient will improve B hip extension and abduction strengh by at least 1/2 MMT to promote ability to negotiate stairs and climb hills with less pain.     Time  6    Period  Weeks    Status  On-going    Target Date  02/07/19      PT LONG TERM GOAL #4   Title  Patient will improve her 6 minute walk distance by at least 100 ft to promote mobility.     Baseline  1045 ft with L lateral hip pain/discomfort (07/16/2018); 1082 ft (09/20/2018); 910 ft (12/25/2018); 1008 ft, no pain or discomfort (01/22/2019)    Time  6    Period  Weeks    Status  On-going    Target Date  02/07/19      PT LONG TERM GOAL #5   Title  Patient will have a decrease in R and L foot pain to 2/10 or less at worst to promote ability to ambulate with less difficulty.     Baseline  5/10 R foot pain, 3/10 L foot pain at most for the past 2 weeks (12/25/2018); 3.5/10 R foot and L foot pain at most for the past 7 days (01/22/2019)    Time  6    Period  Weeks    Status  On-going    Target Date  02/07/19             Plan - 01/31/19 1628    Clinical Impression Statement  Added step up as well as sit <> stand related exercises to promote strength to help decrease difficulty with stair negotiation and standing up from her couch. Pt demonstrates medial knee pain bilaterally with the exercises but decreases with rest. Continued working on medial hamstring strengthening as well as manual therapy to decrease soft tissue tension lateral knee to promote better mechanics at her R knee with closed chain tasks. Pt tolerated session well without aggravation of symptoms. Pt will benefit from continued skilled physical therapy services to decrease pain, improve strength and function.    Personal Factors and Comorbidities  Age;Comorbidity 2    Comorbidities  aortic aneurism, osteopenia    Examination-Activity Limitations  Carry;Squat;Stairs;Locomotion Level    Stability/Clinical Decision Making  Evolving/Moderate complexity  Rehab Potential  Fair    Clinical Impairments Affecting Rehab Potential  (-) age, chronicity of knee pain, hip weakenss. (+) motivated    PT Frequency  2x / week    PT Duration  6 weeks    PT Treatment/Interventions  Aquatic Therapy;Electrical Stimulation;Iontophoresis 21m/ml Dexamethasone;Gait training;Stair training;Therapeutic activities;Therapeutic exercise;Balance training;Neuromuscular re-education;Patient/family education;Manual techniques;Dry needling    PT Next Visit Plan  hip strengthening, femoral control, manual techniques, modalities PRN    Consulted and Agree with Plan of Care  Patient       Patient will benefit from skilled therapeutic intervention in order to improve the following deficits and impairments:  Pain, Postural dysfunction, Improper body mechanics, Decreased strength, Decreased range of motion  Visit Diagnosis: 1. Muscle weakness (generalized)   2. Pain in right foot   3. Difficulty in walking, not elsewhere classified   4. Chronic pain of right knee   5.  Chronic pain of left knee   6. Pain in left foot        Problem List Patient Active Problem List   Diagnosis Date Noted  . Arthritis of knee, degenerative 05/22/2018  . Cataract, right eye 05/17/2018  . Aortic stenosis, mild 02/22/2018  . Obesity (BMI 30.0-34.9) 01/18/2018  . Medication monitoring encounter 12/30/2015  . Productive cough 11/02/2015  . Skin lesion 08/04/2015  . 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 Hip pain, chronic 04/02/2015  . Hearing loss of both ears 04/02/2015  . Osteopenia of the elderly 04/02/2015  . Hormone replacement therapy (postmenopausal) 04/02/2015  . [place]ortic aneurysm without rupture ([place] 04/02/2015  . Situational anxiety 04/02/2015  . Dermatitis 04/02/2015  . Hyperlipidemia LDL goal <70 11/18/2014  . Incomplete bladder emptying 06/29/2014  . Mixed stress and urge urinary incontinence 06/29/2014  . Urge incontinence of urine 06/29/2014  . Hypertension goal BP (blood pressure) < 140/90 11/06/2013  . CAD (coronary artery disease) 11/06/2013    MJoneen BoersPT, DPT  01/31/2019, 6:18 PM  [facility] [address] [place] [address]Phone: [phone]  Fax:  [phone] Name: CROSEZELLA KRONICKMRN: [id number] of Birth: [birth year]

## 2019-02-04 ENCOUNTER — Ambulatory Visit (HOSPITAL_COMMUNITY): Payer: Medicare Other | Attending: Cardiology

## 2019-02-04 ENCOUNTER — Other Ambulatory Visit: Payer: Self-pay

## 2019-02-04 DIAGNOSIS — I350 Nonrheumatic aortic (valve) stenosis: Secondary | ICD-10-CM

## 2019-02-04 MED ORDER — PERFLUTREN LIPID MICROSPHERE
1.0000 mL | INTRAVENOUS | Status: AC | PRN
  Administered 2019-02-04: 3 mL via INTRAVENOUS

## 2019-02-05 ENCOUNTER — Ambulatory Visit: Payer: Medicare Other

## 2019-02-05 DIAGNOSIS — M9902 Segmental and somatic dysfunction of thoracic region: Secondary | ICD-10-CM | POA: Diagnosis not present

## 2019-02-05 DIAGNOSIS — M5416 Radiculopathy, lumbar region: Secondary | ICD-10-CM | POA: Diagnosis not present

## 2019-02-05 DIAGNOSIS — M62830 Muscle spasm of back: Secondary | ICD-10-CM | POA: Diagnosis not present

## 2019-02-05 DIAGNOSIS — M5134 Other intervertebral disc degeneration, thoracic region: Secondary | ICD-10-CM | POA: Diagnosis not present

## 2019-02-05 NOTE — Therapy (Signed)
[facility] [address], [place], [address] Phone: [phone]   Fax:  [phone]  Patient Details  Name: Angel [name] MRN: [id number] Date of Birth: [birth year] Referring Provider:  Arnetha Courser, MD  Encounter Date: 01/03/2019   Patient cancelled this date and chart was opened in error by [facility].  Therapy note ran to remove from open chart list.     [name] 02/05/2019, 8:55 AM  [facility] [address], [place], [address] Phone: [phone]   Fax:  [phone]

## 2019-02-07 ENCOUNTER — Ambulatory Visit: Payer: Medicare Other | Attending: Family Medicine

## 2019-02-07 ENCOUNTER — Other Ambulatory Visit: Payer: Self-pay

## 2019-02-07 DIAGNOSIS — G8929 Other chronic pain: Secondary | ICD-10-CM | POA: Insufficient documentation

## 2019-02-07 DIAGNOSIS — M25561 Pain in right knee: Secondary | ICD-10-CM | POA: Insufficient documentation

## 2019-02-07 DIAGNOSIS — R262 Difficulty in walking, not elsewhere classified: Secondary | ICD-10-CM | POA: Diagnosis not present

## 2019-02-07 DIAGNOSIS — M25562 Pain in left knee: Secondary | ICD-10-CM | POA: Diagnosis not present

## 2019-02-07 DIAGNOSIS — M79671 Pain in right foot: Secondary | ICD-10-CM | POA: Diagnosis not present

## 2019-02-07 DIAGNOSIS — M6281 Muscle weakness (generalized): Secondary | ICD-10-CM | POA: Insufficient documentation

## 2019-02-07 DIAGNOSIS — M79672 Pain in left foot: Secondary | ICD-10-CM | POA: Diagnosis not present

## 2019-02-07 NOTE — Therapy (Signed)
[facility] [address], [place], [address] Phone: [phone]   Fax:  [phone]  Physical Therapy Treatment  Patient Details  Name: Angel [name] MRN: [id number] Date of Birth: Dec 24, 1930 Referring Provider (PT): Enid Derry, MD   Encounter Date: 02/07/2019  PT End of Session - 02/07/19 1601    Visit Number  13    Number of Visits  60    Date for PT Re-Evaluation  03/21/19    Authorization Type  3    Authorization Time Period  of 10 progress report    PT Start Time  1601    PT Stop Time  1642    PT Time Calculation (min)  41 min    Activity Tolerance  Patient tolerated treatment well    Behavior During Therapy  [facility] for tasks assessed/performed       Past Medical History:  Diagnosis Date  . Anxiety   . Aortic aneurysm without rupture ([place])   . CAD (coronary artery disease)   . Chronic hip pain   . Endometriosis   . Hearing loss   . HTN, goal below 150/90   . Hyperlipidemia LDL goal <70 11/18/2014  . Osteopenia of the elderly   . Urge and stress incontinence     Past Surgical History:  Procedure Laterality Date  . ABDOMINAL HYSTERECTOMY  1976   total  . APPENDECTOMY    . BREAST EXCISIONAL BIOPSY Left 1987   benign    There were no vitals filed for this visit.  Subjective Assessment - 02/07/19 1603    Subjective  I have no pain!. No pain in my knees, legs, and feet. Wants to continue PT so  long as insurance covers it. Feels improvement. Cardiologist said that her heart is stable. Standing up from the couch is a little better when she uses the "nose over toes" technique.    Pertinent History  B knee pain since a year or more. Walking up the steps bothers her. Walking around regularly is no pain. Pt lives in a 2 story home, first floor set up. 14 steps to get to second floor, L banister rail starting a third of the way up.  No steps to enter front and back door.  Gradual onset of B knee  pain. Has been to Dr. Marry Guan who did x-rays who said that there is still cartilage in both knees. Was diagnosed with osteoarthritis.  [place]. Walking up hills bothers her knees.  Pt also walks about 20 minutes a day walking her dog.  R knee feels fragile when she starts with it to step up a step therefore she starts with her L, then she can step up with her R LE. Also goes to a chiropracter for adjustments.  Pt also has 5 step to go down to her meditation room, R rail assist in her home.  Has not yet used ice for her knees.   Pt adds that she has B foot pronation and plantar arch pain in which her orthotics that she wears helps her a lot.     Patient Stated Goals  Greater mobility, walk longer.     Currently in Pain?  No/denies    Pain Score  0-No pain    Pain Onset  More than a month ago         [facility] PT Assessment - 02/07/19 1607  Strength   Right Hip Extension  5/5   seated manually resisted hip extension   Right Hip ABduction  5/5   seated manually resisted clamshell   Left Hip Extension  5/5   seated manually resisted hip extension   Left Hip ABduction  5/5   seated manually resisted clamshell                          PT Education - 02/07/19 1611    Education Details  ther-ex, progress/current status with PT towards goals    Person(s) Educated  Patient    Methods  Explanation;Demonstration;Tactile cues;Verbal cues    Comprehension  Returned demonstration;Verbalized understanding      Objective  MedbridgeAccess Code: [email or web address]   No latex band allergies Blood pressure is controlled per pt.  Aortic aneurism osteopenia   Pt demonstrates B foot pronation. Pt states wearing orthotics  Pt states that she got a letter from Dr. Sanda Klein stating that she is leaving the practice due to health issues. Pt was recommended to see their nurse practitioner [name]   Pt states  that getting up from the couch is hard for her.      Therapeutic exercise  Seated manually resisted hip extension, and clamshell isometrics each LE 1x  Gait x 6 minutes 1063 ft  Ascending and descending 4 regular steps with B UE assist 2x. Min to mod level of pain reported B knees  Reviewed progress/current status with PT towards goals  Seated manually resisted hip extension   R 10x3  L 10x3  Reviewed plan of care: continue 2x/week for 6 more weeks  Seated manually resisted hamstrings flexion targeting the medial hamstrings R 10x2 L 10x2  Improved exercise technique, movement at target joints, use of target muscles after min to mod verbal, visual, tactile cues.    Response to treatment Good muscle use felt with exercises. Pt tolerated session well without aggravation of symptoms.    Clinical impression Pt demonstrates overall decreased B foot pain, improved B hip strength and distance walked in 6 minutes since last progress report. B knee pain seems to have stabilized overall to around 3 to 3.5/10 at most throughout the course of treatment. Pt also seems to have decreased B knee pain with stair negotiation overall based on min to mod level of pain with stair negotiation exercise today. Pt still demonstrates B knee and foot pain, difficulty with walking long distances, as well as negotiating stairs and would benefit from continued skilled physical therapy services to address the aforementioned deficits.       PT Short Term Goals - 09/20/18 1905      PT SHORT TERM GOAL #1   Title  Patient will be independent with her HEP to decrease pain, improve strength and function.     Time  3    Period  Weeks    Status  On-going    Target Date  10/11/18        PT Long Term Goals - 02/07/19 1603      PT LONG TERM GOAL #1   Title  Patient will have a decrease in B knee pain to 2/10 or less at worst to decrease difficulty negotiating  stairs or climbing up hills.     Baseline  R knee 5/10 , L knee 4.5/10 at worst for the past 3 months (07/16/2018); 6/10 R knee pain at most, 4.5/10 L knee pain at most for the past 7  days (09/20/2018); 3/10 B knee pain at worst for the past 7 days (09/27/2018); 3/10 B knee pain at worst for the past 2 weeks (12/25/2018). 1.5/10 R knee pain (except with stairs), 1.5/10 L knee pain at most for the past 7 days except with steps/stairs (01/22/2019); 3.5/10 for both knees at most for the past 7 days going up and down steps. 2/10 at most B knees without the stairs (02/07/2019)    Time  6    Period  Weeks    Status  Partially Met    Target Date  03/21/19      PT LONG TERM GOAL #2   Title  Patient will be able to ascend and descend 4 regular steps with bilateral UE assist at least 2 x with minimal to no complain of B knee pain.     Baseline  Increased B knee pain with ascending and descending 4 regular steps with B UE assist (07/16/2018). Still has pain but feels slight improvement (09/20/2018); increased B knee pain with stairs (12/25/2018), (01/22/2019)    Time  6    Period  Weeks    Status  On-going      PT LONG TERM GOAL #3   Title  Patient will improve B hip extension and abduction strengh by at least 1/2 MMT to promote ability to negotiate stairs and climb hills with less pain.     Time  6    Period  Weeks    Status  Achieved    Target Date  02/07/19      PT LONG TERM GOAL #4   Title  Patient will improve her 6 minute walk distance by at least 100 ft to promote mobility.     Baseline  1045 ft with L lateral hip pain/discomfort (07/16/2018); 1082 ft (09/20/2018); 910 ft (12/25/2018); 1008 ft, no pain or discomfort (01/22/2019); 1063 ft (02/07/2019)    Time  6    Period  Weeks    Status  On-going    Target Date  03/21/19      PT LONG TERM GOAL #5   Title  Patient will have a decrease in R and L foot pain to 2/10 or less at worst to promote ability to ambulate with less difficulty.     Baseline  5/10 R  foot pain, 3/10 L foot pain at most for the past 2 weeks (12/25/2018); 3.5/10 R foot and L foot pain at most for the past 7 days (01/22/2019); 2.5/10 B foot pain at most for the past 7 days (02/07/2019)    Time  6    Period  Weeks    Status  Partially Met    Target Date  03/21/19            Plan - 02/07/19 1600    Clinical Impression Statement  Pt demonstrates overall decreased B foot pain, improved B hip strength and distance walked in 6 minutes since last progress report. B knee pain seems to have stabilized overall to around 3 to 3.5/10 at most throughout the course of treatment. Pt also seems to have decreased B knee pain with stair negotiation overall based on min to mod level of pain with stair negotiation exercise today. Pt still demonstrates B knee and foot pain, difficulty with walking long distances, as well as negotiating stairs and would benefit from continued skilled physical therapy services to address the aforementioned deficits.    Personal Factors and Comorbidities  Age;Comorbidity 2    Comorbidities  aortic  aneurism, osteopenia    Examination-Activity Limitations  Carry;Squat;Stairs;Locomotion Level    Stability/Clinical Decision Making  Evolving/Moderate complexity    Clinical Decision Making  Low    Clinical Presentation due to:  improved B foot pain, improved distance walked in 6 minutes, improved B hip strength    Rehab Potential  Fair    Clinical Impairments Affecting Rehab Potential  (-) age, chronicity of knee pain, hip weakenss. (+) motivated    PT Frequency  2x / week    PT Duration  6 weeks    PT Treatment/Interventions  Aquatic Therapy;Electrical Stimulation;Iontophoresis 65m/ml Dexamethasone;Gait training;Stair training;Therapeutic activities;Therapeutic exercise;Balance training;Neuromuscular re-education;Patient/family education;Manual techniques;Dry needling    PT Next Visit Plan  hip strengthening, femoral control, manual techniques, modalities PRN     Consulted and Agree with Plan of Care  Patient       Patient will benefit from skilled therapeutic intervention in order to improve the following deficits and impairments:  Pain, Postural dysfunction, Improper body mechanics, Decreased strength, Decreased range of motion  Visit Diagnosis: 1. Chronic pain of right knee   2. Muscle weakness (generalized)   3. Pain in right foot   4. Difficulty in walking, not elsewhere classified   5. Chronic pain of left knee   6. Pain in left foot        Problem List Patient Active Problem List   Diagnosis Date Noted  . Arthritis of knee, degenerative 05/22/2018  . Cataract, right eye 05/17/2018  . Aortic stenosis, mild 02/22/2018  . Obesity (BMI 30.0-34.9) 01/18/2018  . Medication monitoring encounter 12/30/2015  . Productive cough 11/02/2015  . Skin lesion 08/04/2015  . 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 Hip pain, chronic 04/02/2015  . Hearing loss of both ears 04/02/2015  . Osteopenia of the elderly 04/02/2015  . Hormone replacement therapy (postmenopausal) 04/02/2015  . [place] ([place] 04/02/2015  . Situational anxiety 04/02/2015  . Dermatitis 04/02/2015  . Hyperlipidemia LDL goal <70 11/18/2014  . Incomplete bladder emptying 06/29/2014  . Mixed stress and urge urinary incontinence 06/29/2014  . Urge incontinence of urine 06/29/2014  . Hypertension goal BP (blood pressure) < 140/90 11/06/2013  . CAD (coronary artery disease) 11/06/2013    MJoneen BoersPT, DPT   02/07/2019, 6:14 PM  [facility] [address] [place] [address]Phone: [phone]  Fax:  [phone] Name: CKATHEEN ASLINMRN: [id number] of Birth: [birth year]

## 2019-02-11 DIAGNOSIS — M5416 Radiculopathy, lumbar region: Secondary | ICD-10-CM | POA: Diagnosis not present

## 2019-02-11 DIAGNOSIS — M62830 Muscle spasm of back: Secondary | ICD-10-CM | POA: Diagnosis not present

## 2019-02-11 DIAGNOSIS — M5134 Other intervertebral disc degeneration, thoracic region: Secondary | ICD-10-CM | POA: Diagnosis not present

## 2019-02-11 DIAGNOSIS — M9902 Segmental and somatic dysfunction of thoracic region: Secondary | ICD-10-CM | POA: Diagnosis not present

## 2019-02-12 ENCOUNTER — Ambulatory Visit: Payer: Medicare Other

## 2019-02-12 NOTE — Progress Notes (Signed)
Cardiology Office Note:    Date:  02/13/2019   ID:  Melvenia, Favela 1930-12-05, MRN [id number]  PCP:  Arnetha Courser, MD  Cardiologist:  No primary care provider on file.   Referring MD: Arnetha Courser, MD   Chief Complaint  Patient presents with   Coronary Artery Disease   Cardiac Valve Problem    History of Present Illness:    Angel [name] is a 85 y.o. female with a hx of poor R-wave progression on EKG, prior history of abnormal myocardial perfusion study, question of small apical infarct or diverticulum by cardiac MRI, essential hypertension, hyperlipidemia, aortic stenosis andaortic enlargement.  No cardiac complaints.  Specifically no chest pain, dyspnea, swelling, orthopnea, palpitations, or episodes of syncope.  She has no recollection of aortic enlargement.  She did have a cardiac MRI in the past.  Past Medical History:  Diagnosis Date   Anxiety    Aortic aneurysm without rupture (HCC)    CAD (coronary artery disease)    Chronic hip pain    Endometriosis    Hearing loss    HTN, goal below 150/90    Hyperlipidemia LDL goal <70 11/18/2014   Osteopenia of the elderly    Urge and stress incontinence     Past Surgical History:  Procedure Laterality Date   ABDOMINAL HYSTERECTOMY  1976   total   APPENDECTOMY     BREAST EXCISIONAL BIOPSY Left 1987   benign    Current Medications: Current Meds  Medication Sig   amLODipine (NORVASC) 5 MG tablet TAKE 1 TABLET BY MOUTH DAILY   Ascorbic Acid (VITAMIN C) 100 MG tablet Take 100 mg by mouth daily.   atorvastatin (LIPITOR) 40 MG tablet TAKE ONE TABLET BY MOUTH EVERY NIGHT AT BEDTIME   clobetasol cream (TEMOVATE) 6.37 % Apply 1 application topically 2 (two) times daily as needed (rashes).   estrogens, conjugated, (PREMARIN) 0.3 MG tablet Take one-half of a tablet by mouth twice a week   glucosamine-chondroitin 500-400 MG tablet Take 1 tablet by mouth daily.   lisinopril (ZESTRIL) 40 MG tablet  Take 40 mg by mouth daily.   metoprolol succinate (TOPROL-XL) 50 MG 24 hr tablet TAKE 1 TABLET BY MOUTH DAILY WITH OR IMMEDIATELY FOLLOWING A MEAL   Multiple Vitamin (MULTIVITAMIN) capsule Take 1 capsule by mouth daily.   vitamin B-12 (CYANOCOBALAMIN) 100 MCG tablet Take 100 mcg by mouth daily.   VITAMIN D, ERGOCALCIFEROL, PO Take 1,000 Units by mouth daily.     Allergies:   Myrbetriq [mirabegron], Codeine, Levofloxacin, Prednisone, and Sulfa antibiotics   Social History   Socioeconomic History   Marital status: Widowed    Spouse name: Not on file   Number of children: Not on file   Years of education: Not on file   Highest education level: Not on file  Occupational History   Not on file  Social Needs   Financial resource strain: Not on file   Food insecurity    Worry: Not on file    Inability: Not on file   Transportation needs    Medical: Not on file    Non-medical: Not on file  Tobacco Use   Smoking status: Never Smoker   Smokeless tobacco: Never Used  Substance and Sexual Activity   Alcohol use: Yes    Alcohol/week: 1.0 standard drinks    Types: 1 Cans of beer per week    Comment: occasional   Drug use: No   Sexual activity: Yes  Lifestyle   Physical activity    Days per week: Not on file    Minutes per session: Not on file   Stress: Not on file  Relationships   Social connections    Talks on phone: Not on file    Gets together: Not on file    Attends religious service: Not on file    Active member of club or organization: Not on file    Attends meetings of clubs or organizations: Not on file    Relationship status: Not on file  Other Topics Concern   Not on file  Social History Narrative   Not on file     Family History: The patient's family history includes Breast cancer (age of onset: 83) in her maternal grandmother; CVA in her mother; Cancer in her brother; Heart disease in her maternal grandmother; Stroke in her mother.  ROS:     Please see the history of present illness.    Osteoarthritis involving hands and knees.  All other systems reviewed and are negative.  EKGs/Labs/Other Studies Reviewed:    The following studies were reviewed today: Echocardiogram 01/2019: IMPRESSIONS    1. The left ventricle has normal systolic function with an ejection fraction of 60-65%. The cavity size was normal. There is mildly increased left ventricular wall thickness. Left ventricular diastolic Doppler parameters are consistent with impaired  relaxation. Elevated mean left atrial pressure.  2. The right ventricle has normal systolic function. The cavity was normal.  3. The mitral valve is abnormal. Mild thickening of the mitral valve leaflet. There is mild mitral annular calcification present.  4. The tricuspid valve is grossly normal.  5. There is mild dilatation of the ascending aorta measuring 38 mm.  6. Normal LV systolic function; mild diastolic dysfunction; mild LVH; mildly dilated ascending aorta.  7.  Calcified aortic valve with mild AS (mean gradient 13 mmHg).  EKG:  EKG sinus rhythm, 75 bpm, left atrial abnormality, poor R wave progression V1 through V4.  When compared to prior tracing from July 2019, no significant changes noted.  Recent Labs: 02/26/2018: BUN 23; Creat 0.77; Potassium 4.2; Sodium 138  Recent Lipid Panel    Component Value Date/Time   CHOL 169 01/18/2018 1416   CHOL 170 01/06/2016 0839   TRIG 266 (H) 01/18/2018 1416   HDL 52 01/18/2018 1416   HDL 53 01/06/2016 0839   CHOLHDL 3.3 01/18/2018 1416   VLDL 70 (H) 08/04/2016 1601   LDLCALC 82 01/18/2018 1416    Physical Exam:    VS:  BP 132/72    Pulse 75    Ht 5\' 4"  (1.626 m)    Wt 196 lb 12.8 oz (89.3 kg)    LMP  (LMP Unknown)    SpO2 94%    BMI 33.78 kg/m     Wt Readings from Last 3 Encounters:  02/13/19 196 lb 12.8 oz (89.3 kg)  06/07/18 184 lb 11.2 oz (83.8 kg)  05/17/18 191 lb 4.8 oz (86.8 kg)     GEN: Alert obesity. No acute  distress HEENT: Normal NECK: No JVD. LYMPHATICS: No lymphadenopathy CARDIAC: 2/6 systolic murmur.  No gallop.  RRR.  Trace bilateral ankle edema VASCULAR: 2+ radial and posterior tibial bilateral pulses, no bruits RESPIRATORY:  Clear to auscultation without rales, wheezing or rhonchi  ABDOMEN: Soft, non-tender, non-distended, No pulsatile mass, MUSCULOSKELETAL: No deformity  SKIN: Warm and dry NEUROLOGIC:  Alert and oriented x 3 PSYCHIATRIC:  Normal affect   ASSESSMENT:  1. Coronary artery disease involving native coronary artery of native heart without angina pectoris   2. Aortic stenosis, mild   3. Hyperlipidemia LDL goal <70   4. Hypertension goal BP (blood pressure) < 140/90   5. Aortic aneurysm without rupture, unspecified portion of aorta (HCC)    PLAN:    In order of problems listed above:  1. Secondary risk prevention discussed.  Lipid panel will be obtained today. 2. Continued mild aortic stenosis. 3. LDL will be checked today.  Continue high intensity statin therapy. 4. Adequate blood pressure control.  Target 130/80 mmHg. 5. Aortic enlargement without evidence of enlargement on echo.  Investigate whether the diagnosis originated.  No clinical documentation available.  Overall education and awareness concerning primary/secondary risk prevention was discussed in detail: LDL less than 70, hemoglobin A1c less than 7, blood pressure target less than 130/80 mmHg, >150 minutes of moderate aerobic activity per week, avoidance of smoking, weight control (via diet and exercise), and continued surveillance/management of/for obstructive sleep apnea.    Medication Adjustments/Labs and Tests Ordered: Current medicines are reviewed at length with the patient today.  Concerns regarding medicines are outlined above.  Orders Placed This Encounter  Procedures   Lipid panel   Hepatic function panel   Basic metabolic panel   EKG [id number]   No orders of the defined types were  placed in this encounter.   Patient Instructions  Medication Instructions:  Your physician recommends that you continue on your current medications as directed. Please refer to the Current Medication list given to you today.  If you need a refill on your cardiac medications before your next appointment, please call your pharmacy.   Lab work: Liver, Lipid and BMET today  If you have labs (blood work) drawn today and your tests are completely normal, you will receive your results only by:  [place] (if you have MyChart) OR  A paper copy in the mail If you have any lab test that is abnormal or we need to change your treatment, we will call you to review the results.  Testing/Procedures: None  Follow-Up: At [facility], you and your health needs are our priority.  As part of our continuing mission to provide you with exceptional heart care, we have created designated Provider Care Teams.  These Care Teams include your primary Cardiologist (physician) and Advanced Practice Providers (APPs -  Physician Assistants and Nurse Practitioners) who all work together to provide you with the care you need, when you need it. You will need a follow up appointment in 12 months.  Please call our office 2 months in advance to schedule this appointment.  You may see Dr. Tamala Julian or one of the following Advanced Practice Providers on your designated Care Team:   Truitt Merle, NP Cecilie Kicks, NP  Kathyrn Drown, NP  Any Other Special Instructions Will Be Listed Below (If Applicable).       Signed, Sinclair Grooms, MD  02/13/2019 4:20 PM    [facility]

## 2019-02-13 ENCOUNTER — Telehealth: Payer: Self-pay | Admitting: Interventional Cardiology

## 2019-02-13 ENCOUNTER — Other Ambulatory Visit: Payer: Self-pay

## 2019-02-13 ENCOUNTER — Ambulatory Visit (INDEPENDENT_AMBULATORY_CARE_PROVIDER_SITE_OTHER): Payer: Medicare Other | Admitting: Interventional Cardiology

## 2019-02-13 ENCOUNTER — Encounter: Payer: Self-pay | Admitting: Interventional Cardiology

## 2019-02-13 VITALS — BP 132/72 | HR 75 | Ht 64.0 in | Wt 196.8 lb

## 2019-02-13 DIAGNOSIS — I719 Aortic aneurysm of unspecified site, without rupture: Secondary | ICD-10-CM | POA: Diagnosis not present

## 2019-02-13 DIAGNOSIS — E785 Hyperlipidemia, unspecified: Secondary | ICD-10-CM | POA: Diagnosis not present

## 2019-02-13 DIAGNOSIS — I10 Essential (primary) hypertension: Secondary | ICD-10-CM

## 2019-02-13 DIAGNOSIS — I350 Nonrheumatic aortic (valve) stenosis: Secondary | ICD-10-CM | POA: Diagnosis not present

## 2019-02-13 DIAGNOSIS — I2510 Atherosclerotic heart disease of native coronary artery without angina pectoris: Secondary | ICD-10-CM | POA: Diagnosis not present

## 2019-02-13 NOTE — Patient Instructions (Signed)
Medication Instructions:  Your physician recommends that you continue on your current medications as directed. Please refer to the Current Medication list given to you today.  If you need a refill on your cardiac medications before your next appointment, please call your pharmacy.   Lab work: Liver, Lipid and BMET today  If you have labs (blood work) drawn today and your tests are completely normal, you will receive your results only by: [name] MyChart Message (if you have MyChart) OR . A paper copy in the mail If you have any lab test that is abnormal or we need to change your treatment, we will call you to review the results.  Testing/Procedures: None  Follow-Up: At [facility], you and your health needs are our priority.  As part of our continuing mission to provide you with exceptional heart care, we have created designated Provider Care Teams.  These Care Teams include your primary Cardiologist (physician) and Advanced Practice Providers (APPs -  Physician Assistants and Nurse Practitioners) who all work together to provide you with the care you need, when you need it. You will need a follow up appointment in 12 months.  Please call our office 2 months in advance to schedule this appointment.  You may see Dr. Tamala Julian or one of the following Advanced Practice Providers on your designated Care Team:   Truitt Merle, NP Cecilie Kicks, NP . Kathyrn Drown, NP  Any Other Special Instructions Will Be Listed Below (If Applicable).

## 2019-02-13 NOTE — Telephone Encounter (Signed)

## 2019-02-13 NOTE — Telephone Encounter (Signed)
New Message ° ° ° °Left message to confirm appt and answer covid questions  °

## 2019-02-14 ENCOUNTER — Ambulatory Visit: Payer: Medicare Other

## 2019-02-14 DIAGNOSIS — G8929 Other chronic pain: Secondary | ICD-10-CM

## 2019-02-14 DIAGNOSIS — M79672 Pain in left foot: Secondary | ICD-10-CM

## 2019-02-14 DIAGNOSIS — M79671 Pain in right foot: Secondary | ICD-10-CM

## 2019-02-14 DIAGNOSIS — R262 Difficulty in walking, not elsewhere classified: Secondary | ICD-10-CM

## 2019-02-14 DIAGNOSIS — M6281 Muscle weakness (generalized): Secondary | ICD-10-CM | POA: Diagnosis not present

## 2019-02-14 DIAGNOSIS — M25562 Pain in left knee: Secondary | ICD-10-CM | POA: Diagnosis not present

## 2019-02-14 DIAGNOSIS — M25561 Pain in right knee: Secondary | ICD-10-CM | POA: Diagnosis not present

## 2019-02-14 LAB — LIPID PANEL
Chol/HDL Ratio: 4.3 ratio (ref 0.0–4.4)
Cholesterol, Total: 179 mg/dL (ref 100–199)
HDL: 42 mg/dL (ref >39)
LDL Calculated: 74 mg/dL (ref 0–99)
Triglycerides: 314 mg/dL — ABNORMAL HIGH (ref 0–149)
VLDL Cholesterol Cal: 63 mg/dL — ABNORMAL HIGH (ref 5–40)

## 2019-02-14 LAB — HEPATIC FUNCTION PANEL
ALT: 21 IU/L (ref 0–32)
AST: 23 IU/L (ref 0–40)
Albumin: 4.1 g/dL (ref 3.6–4.6)
Alkaline Phosphatase: 51 IU/L (ref 39–117)
Bilirubin Total: 0.2 mg/dL (ref 0.0–1.2)
Bilirubin, Direct: 0.09 mg/dL (ref 0.00–0.40)
Total Protein: 6.5 g/dL (ref 6.0–8.5)

## 2019-02-14 LAB — BASIC METABOLIC PANEL
BUN/Creatinine Ratio: 26 (ref 12–28)
BUN: 29 mg/dL — ABNORMAL HIGH (ref 8–27)
CO2: 22 mmol/L (ref 20–29)
Calcium: 9 mg/dL (ref 8.7–10.3)
Chloride: 106 mmol/L (ref 96–106)
Creatinine, Ser: 1.13 mg/dL — ABNORMAL HIGH (ref 0.57–1.00)
GFR calc Af Amer: 51 mL/min/{1.73_m2} — ABNORMAL LOW (ref >59)
GFR calc non Af Amer: 44 mL/min/{1.73_m2} — ABNORMAL LOW (ref >59)
Glucose: 136 mg/dL — ABNORMAL HIGH (ref 65–99)
Potassium: 4.1 mmol/L (ref 3.5–5.2)
Sodium: 141 mmol/L (ref 134–144)

## 2019-02-14 NOTE — Therapy (Signed)
[facility] [address], [place], [address] Phone: [phone]   Fax:  [phone]  Physical Therapy Treatment  Patient Details  Name: Angel [name] MRN: [id number] Date of Birth: 1930/11/23 Referring Provider (PT): Enid Derry, MD   Encounter Date: 02/14/2019  PT End of Session - 02/14/19 1600    Visit Number  14    Number of Visits  49    Date for PT Re-Evaluation  03/21/19    Authorization Type  4    Authorization Time Period  of 10 progress report    PT Start Time  1601    PT Stop Time  1632    PT Time Calculation (min)  31 min    Activity Tolerance  Patient tolerated treatment well    Behavior During Therapy  [facility] for tasks assessed/performed       Past Medical History:  Diagnosis Date  . Anxiety   . Aortic aneurysm without rupture ([place])   . CAD (coronary artery disease)   . Chronic hip pain   . Endometriosis   . Hearing loss   . HTN, goal below 150/90   . Hyperlipidemia LDL goal <70 11/18/2014  . Osteopenia of the elderly   . Urge and stress incontinence     Past Surgical History:  Procedure Laterality Date  . ABDOMINAL HYSTERECTOMY  1976   total  . APPENDECTOMY    . BREAST EXCISIONAL BIOPSY Left 1987   benign    There were no vitals filed for this visit.  Subjective Assessment - 02/14/19 1601    Subjective  B knee and feet are doing quite well. 2/10 B knees and feet currently    Pertinent History  B knee pain since a year or more. Walking up the steps bothers her. Walking around regularly is no pain. Pt lives in a 2 story home, first floor set up. 14 steps to get to second floor, L banister rail starting a third of the way up.  No steps to enter front and back door.  Gradual onset of B knee pain. Has been to Dr. Marry Guan who did x-rays who said that there is still cartilage in both knees. Was diagnosed with osteoarthritis.  [place]. Walking up hills bothers her knees.  Pt also walks about 20 minutes a day walking her dog.  R knee feels fragile when she starts with it to step up a step therefore she starts with her L, then she can step up with her R LE. Also goes to a chiropracter for adjustments.  Pt also has 5 step to go down to her meditation room, R rail assist in her home.  Has not yet used ice for her knees.   Pt adds that she has B foot pronation and plantar arch pain in which her orthotics that she wears helps her a lot.     Patient Stated Goals  Greater mobility, walk longer.     Currently in Pain?  Yes    Pain Score  2     Pain Onset  More than a month ago                               PT Education - 02/14/19 1849    Education Details  ther-ex    [employer]) Educated  Patient  Methods  Explanation;Demonstration;Tactile cues;Verbal cues    Comprehension  Returned demonstration;Verbalized understanding      Objective  MedbridgeAccess Code: [email or web address]   No latex band allergies Blood pressure is controlled per pt.  Aortic aneurism osteopenia   Pt demonstrates B foot pronation. Pt states wearing orthotics  Pt states that she got a letter from Dr. Sanda Klein stating that she is leaving the practice due to health issues. Pt was recommended to see their nurse practitioner [name]   Pt states that getting up from the couch is hard for her.      Therapeutic exercise   Seated manually resisted hamstrings flexion targeting the medial hamstrings R 10x2 L 10x2  Seated manually resisted hip extension              R 10x3             L 10x3   Standing gastroc stretch at stair step with B UE assist 20 seconds x 5   Standing hip machine height 2   Hip extension    R plate 40 for 53P9   L plate 40 for 43E7   Hip abduction    R plate 10 for 61Y, then 5x   L plate 10 for 70L, then 5x   Improved exercise  technique, movement at target joints, use of target muscles after min to mod verbal, visual, tactile cues.      Manual therapy   Seated STM R lateral hamstrings, R vastus lateralis, and R iliotibial band   Response to treatment Good muscle use felt with exercises.Pt tolerated session well without aggravation of symptoms. 1/10 B knee and foot pain after session.    Clinical impression Continued working on glute and medial hamstring strength to promote proper mechanics at her knee when performing closed chain activities as well as to promote ability to step up onto a curb or negotiate stairs more comfortably. Pt tolerated session well without aggravation of symptoms. Pt will benefit from continued skilled physical therapy services to decrease B knee and foot pain, improve strength, and function .     PT Short Term Goals - 09/20/18 1905      PT SHORT TERM GOAL #1   Title  Patient will be independent with her HEP to decrease pain, improve strength and function.     Time  3    Period  Weeks    Status  On-going    Target Date  10/11/18        PT Long Term Goals - 02/07/19 1603      PT LONG TERM GOAL #1   Title  Patient will have a decrease in B knee pain to 2/10 or less at worst to decrease difficulty negotiating stairs or climbing up hills.     Baseline  R knee 5/10 , L knee 4.5/10 at worst for the past 3 months (07/16/2018); 6/10 R knee pain at most, 4.5/10 L knee pain at most for the past 7 days (09/20/2018); 3/10 B knee pain at worst for the past 7 days (09/27/2018); 3/10 B knee pain at worst for the past 2 weeks (12/25/2018). 1.5/10 R knee pain (except with stairs), 1.5/10 L knee pain at most for the past 7 days except with steps/stairs (01/22/2019); 3.5/10 for both knees at most for the past 7 days going up and down steps. 2/10 at most B knees without the stairs (02/07/2019)    Time  6    Period  Weeks  Status  Partially Met    Target Date  03/21/19      PT LONG TERM GOAL  #2   Title  Patient will be able to ascend and descend 4 regular steps with bilateral UE assist at least 2 x with minimal to no complain of B knee pain.     Baseline  Increased B knee pain with ascending and descending 4 regular steps with B UE assist (07/16/2018). Still has pain but feels slight improvement (09/20/2018); increased B knee pain with stairs (12/25/2018), (01/22/2019)    Time  6    Period  Weeks    Status  On-going      PT LONG TERM GOAL #3   Title  Patient will improve B hip extension and abduction strengh by at least 1/2 MMT to promote ability to negotiate stairs and climb hills with less pain.     Time  6    Period  Weeks    Status  Achieved    Target Date  02/07/19      PT LONG TERM GOAL #4   Title  Patient will improve her 6 minute walk distance by at least 100 ft to promote mobility.     Baseline  1045 ft with L lateral hip pain/discomfort (07/16/2018); 1082 ft (09/20/2018); 910 ft (12/25/2018); 1008 ft, no pain or discomfort (01/22/2019); 1063 ft (02/07/2019)    Time  6    Period  Weeks    Status  On-going    Target Date  03/21/19      PT LONG TERM GOAL #5   Title  Patient will have a decrease in R and L foot pain to 2/10 or less at worst to promote ability to ambulate with less difficulty.     Baseline  5/10 R foot pain, 3/10 L foot pain at most for the past 2 weeks (12/25/2018); 3.5/10 R foot and L foot pain at most for the past 7 days (01/22/2019); 2.5/10 B foot pain at most for the past 7 days (02/07/2019)    Time  6    Period  Weeks    Status  Partially Met    Target Date  03/21/19            Plan - 02/14/19 1550    Clinical Impression Statement  Continued working on glute and medial hamstring strength to promote proper mechanics at her knee when performing closed chain activities as well as to promote ability to step up onto a curb or negotiate stairs more comfortably. Pt tolerated session well without aggravation of symptoms. Pt will benefit from continued skilled  physical therapy services to decrease B knee and foot pain, improve strength, and function .    Personal Factors and Comorbidities  Age;Comorbidity 2    Comorbidities  aortic aneurism, osteopenia    Examination-Activity Limitations  Carry;Squat;Stairs;Locomotion Level    Stability/Clinical Decision Making  Evolving/Moderate complexity    Rehab Potential  Fair    Clinical Impairments Affecting Rehab Potential  (-) age, chronicity of knee pain, hip weakenss. (+) motivated    PT Frequency  2x / week    PT Duration  6 weeks    PT Treatment/Interventions  Aquatic Therapy;Electrical Stimulation;Iontophoresis 63m/ml Dexamethasone;Gait training;Stair training;Therapeutic activities;Therapeutic exercise;Balance training;Neuromuscular re-education;Patient/family education;Manual techniques;Dry needling    PT Next Visit Plan  hip strengthening, femoral control, manual techniques, modalities PRN    Consulted and Agree with Plan of Care  Patient       Patient will benefit from  skilled therapeutic intervention in order to improve the following deficits and impairments:  Pain, Postural dysfunction, Improper body mechanics, Decreased strength, Decreased range of motion  Visit Diagnosis: 1. Muscle weakness (generalized)   2. Pain in right foot   3. Difficulty in walking, not elsewhere classified   4. Chronic pain of right knee   5. Chronic pain of left knee   6. Pain in left foot        Problem List Patient Active Problem List   Diagnosis Date Noted  . Arthritis of knee, degenerative 05/22/2018  . Cataract, right eye 05/17/2018  . Aortic stenosis, mild 02/22/2018  . Obesity (BMI 30.0-34.9) 01/18/2018  . Medication monitoring encounter 12/30/2015  . Productive cough 11/02/2015  . Skin lesion 08/04/2015  . 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 Hip pain, chronic 04/02/2015  . Hearing loss of both ears 04/02/2015  . Osteopenia of the elderly 04/02/2015  . Hormone replacement therapy (postmenopausal)  04/02/2015  . Aortic aneurysm without rupture ([place]) 04/02/2015  . Situational anxiety 04/02/2015  . Dermatitis 04/02/2015  . Hyperlipidemia LDL goal <70 11/18/2014  . Incomplete bladder emptying 06/29/2014  . Mixed stress and urge urinary incontinence 06/29/2014  . Urge incontinence of urine 06/29/2014  . Hypertension goal BP (blood pressure) < 140/90 11/06/2013  . CAD (coronary artery disease) 11/06/2013    Joneen Boers PT, DPT   02/14/2019, 6:54 PM  [facility] [address] [place], [address] Phone: [phone]   Fax:  [phone]  Name: OLISA QUESNEL MRN: [id number] Date of Birth: 1931/04/23

## 2019-02-19 ENCOUNTER — Ambulatory Visit: Payer: Medicare Other

## 2019-02-21 ENCOUNTER — Other Ambulatory Visit: Payer: Self-pay

## 2019-02-21 ENCOUNTER — Ambulatory Visit: Payer: Medicare Other

## 2019-02-21 DIAGNOSIS — M6281 Muscle weakness (generalized): Secondary | ICD-10-CM | POA: Diagnosis not present

## 2019-02-21 DIAGNOSIS — M25562 Pain in left knee: Secondary | ICD-10-CM | POA: Diagnosis not present

## 2019-02-21 DIAGNOSIS — G8929 Other chronic pain: Secondary | ICD-10-CM | POA: Diagnosis not present

## 2019-02-21 DIAGNOSIS — M79671 Pain in right foot: Secondary | ICD-10-CM

## 2019-02-21 DIAGNOSIS — M79672 Pain in left foot: Secondary | ICD-10-CM

## 2019-02-21 DIAGNOSIS — R262 Difficulty in walking, not elsewhere classified: Secondary | ICD-10-CM | POA: Diagnosis not present

## 2019-02-21 DIAGNOSIS — M25561 Pain in right knee: Secondary | ICD-10-CM | POA: Diagnosis not present

## 2019-02-21 NOTE — Therapy (Addendum)
[facility] PH[facility]ICINE [address], [place], [address] Phone: [phone]   Fax:  [phone]  Physical Therapy Treatment  Patient Details  Name: Angel [name] MRN: [id number] Date of Birth: 03-22-1931 Referring Provider (PT): Baruch Gouty, MD   Encounter Date: 02/21/2019  PT End of Session - 02/21/19 1600    Visit Number  15    Number of Visits  49    Date for PT Re-Evaluation  03/21/19    Authorization Type  5    Authorization Time Period  10    PT Start Time  1557   pt arrived late   PT Stop Time  1627    PT Time Calculation (min)  30 min    Activity Tolerance  Patient tolerated treatment well    Behavior During Therapy  [facility] for tasks assessed/performed       Past Medical History:  Diagnosis Date  . Anxiety   . Aortic aneurysm without rupture (HCC)   . CAD (coronary artery disease)   . Chronic hip pain   . Endometriosis   . Hearing loss   . HTN, goal below 150/90   . Hyperlipidemia LDL goal <70 11/18/2014  . Osteopenia of the elderly   . Urge and stress incontinence     Past Surgical History:  Procedure Laterality Date  . ABDOMINAL HYSTERECTOMY  1976   total  . APPENDECTOMY    . BREAST EXCISIONAL BIOPSY Left 1987   benign    There were no vitals filed for this visit.  Subjective Assessment - 02/21/19 1559    Subjective  Pt reports she is doing well this date. Has been working on her HEP. She has no pain.    Pertinent History  B knee pain since a year or more. Walking up the steps bothers her. Walking around regularly is no pain. Pt lives in a 2 story home, first floor set up. 14 steps to get to second floor, L banister rail starting a third of the way up.  No steps to enter front and back door.  Gradual onset of B knee pain. Has been to Dr. Ernest Pine who did x-rays who said that there is still cartilage in both knees. Was diagnosed with osteoarthritis.  [place]. Walking up hills bothers her knees.  Pt also walks about 20 minutes a day walking her dog.  R knee feels fragile when she starts with it to step up a step therefore she starts with her L, then she can step up with her R LE. Also goes to a chiropracter for adjustments.  Pt also has 5 step to go down to her meditation room, R rail assist in her home.  Has not yet used ice for her knees.   Pt adds that she has B foot pronation and plantar arch pain in which her orthotics that she wears helps her a lot.     Currently in Pain?  No/denies       INTERVENTION THIS DATE: -hooklying gluteal bridge 2x10, end range knee/hip fleixon and wide stance  -hooklying SAQ over foam roll, 2x10 c 3lb ankle weights - STS Hands free from elevated plinth 2x10, plinth elevated to point of tolerance for knees, extensive education to deter from quads dominant straight leg strategy which is difficult to break. (plinth at 53cm height) -lateral step up 1x10 bilat, 4" plastic step  -  Knee distraction/fleixon mobilization: 2x30sec bilat in hooklying   PT Short Term Goals - 09/20/18 1905      PT SHORT TERM GOAL #1   Title  Patient will be independent with her HEP to decrease pain, improve strength and function.     Time  3    Period  Weeks    Status  On-going    Target Date  10/11/18        PT Long Term Goals - 02/07/19 1603      PT LONG TERM GOAL #1   Title  Patient will have a decrease in B knee pain to 2/10 or less at worst to decrease difficulty negotiating stairs or climbing up hills.     Baseline  R knee 5/10 , L knee 4.5/10 at worst for the past 3 months (07/16/2018); 6/10 R knee pain at most, 4.5/10 L knee pain at most for the past 7 days (09/20/2018); 3/10 B knee pain at worst for the past 7 days (09/27/2018); 3/10 B knee pain at worst for the past 2 weeks (12/25/2018). 1.5/10 R knee pain (except with stairs), 1.5/10 L knee pain at most for the past 7 days except with steps/stairs  (01/22/2019); 3.5/10 for both knees at most for the past 7 days going up and down steps. 2/10 at most B knees without the stairs (02/07/2019)    Time  6    Period  Weeks    Status  Partially Met    Target Date  03/21/19      PT LONG TERM GOAL #2   Title  Patient will be able to ascend and descend 4 regular steps with bilateral UE assist at least 2 x with minimal to no complain of B knee pain.     Baseline  Increased B knee pain with ascending and descending 4 regular steps with B UE assist (07/16/2018). Still has pain but feels slight improvement (09/20/2018); increased B knee pain with stairs (12/25/2018), (01/22/2019)    Time  6    Period  Weeks    Status  On-going      PT LONG TERM GOAL #3   Title  Patient will improve B hip extension and abduction strengh by at least 1/2 MMT to promote ability to negotiate stairs and climb hills with less pain.     Time  6    Period  Weeks    Status  Achieved    Target Date  02/07/19      PT LONG TERM GOAL #4   Title  Patient will improve her 6 minute walk distance by at least 100 ft to promote mobility.     Baseline  1045 ft with L lateral hip pain/discomfort (07/16/2018); 1082 ft (09/20/2018); 910 ft (12/25/2018); 1008 ft, no pain or discomfort (01/22/2019); 1063 ft (02/07/2019)    Time  6    Period  Weeks    Status  On-going    Target Date  03/21/19      PT LONG TERM GOAL #5   Title  Patient will have a decrease in R and L foot pain to 2/10 or less at worst to promote ability to ambulate with less difficulty.     Baseline  5/10 R foot pain, 3/10 L foot pain at most for the past 2 weeks (12/25/2018); 3.5/10 R foot and L foot pain at most for the past 7 days (01/22/2019); 2.5/10 B foot pain at most for the past 7 days (02/07/2019)    Time  6    Period  Weeks    Status  Partially Met    Target Date  03/21/19            Plan - 02/21/19 1606    Clinical Impression Statement  In general, patient demonstrating good tolerance to therapy session this date,  reasonable accommodations are alllowed in-session to allow adequate rest between activities as needed. All interventional executed without any exacerbation of pain or other symptoms. Pt demonstrates focused motivation to fully participate in therapy to the best of ability. Pt is able to progress some strengthening interventions as well as some balance interventions, although endurance remains limited. Pt given intermittent multimodal cues to teach best possible form with exercises. Pt continues to make steady progress toward most goals. No home exercise updates made at this time.    Rehab Potential  Fair    PT Frequency  2x / week    PT Duration  6 weeks    PT Treatment/Interventions  Aquatic Therapy;Electrical Stimulation;Iontophoresis 4mg /ml Dexamethasone;Gait training;Stair training;Therapeutic activities;Therapeutic exercise;Balance training;Neuromuscular re-education;Patient/family education;Manual techniques;Dry needling    PT Next Visit Plan  hip strengthening, femoral control, manual techniques, modalities PRN    PT Home Exercise Plan  no updates this session    Consulted and Agree with Plan of Care  Patient       Patient will benefit from skilled therapeutic intervention in order to improve the following deficits and impairments:  Pain, Postural dysfunction, Improper body mechanics, Decreased strength, Decreased range of motion  Visit Diagnosis: 1. Muscle weakness (generalized)   2. Pain in right foot   3. Difficulty in walking, not elsewhere classified   4. Chronic pain of right knee   5. Chronic pain of left knee   6. Pain in left foot        Problem List Patient Active Problem List   Diagnosis Date Noted  . Arthritis of knee, degenerative 05/22/2018  . Cataract, right eye 05/17/2018  . Aortic stenosis, mild 02/22/2018  . Obesity (BMI 30.0-34.9) 01/18/2018  . Medication monitoring encounter 12/30/2015  . Productive cough 11/02/2015  . Skin lesion 08/04/2015  . Crossover  toe 08/04/2015  . Hip pain, chronic 04/02/2015  . Hearing loss of both ears 04/02/2015  . Osteopenia of the elderly 04/02/2015  . Hormone replacement therapy (postmenopausal) 04/02/2015  . Aortic aneurysm without rupture (HCC) 04/02/2015  . Situational anxiety 04/02/2015  . Dermatitis 04/02/2015  . Hyperlipidemia LDL goal <70 11/18/2014  . Incomplete bladder emptying 06/29/2014  . Mixed stress and urge urinary incontinence 06/29/2014  . Urge incontinence of urine 06/29/2014  . Hypertension goal BP (blood pressure) < 140/90 11/06/2013  . CAD (coronary artery disease) 11/06/2013    4:11 PM, 02/21/19 Rosamaria Lints, PT, DPT Physical Therapist - [place] [phone] (Office)    Cylas Falzone C 02/21/2019, 4:11 PM  [facility] PHYSICAL AND SPORTS MEDICINE [address] [place], [address] Phone: [phone]   Fax:  [phone]  Name: Angel [name] MRN: [id number] Date of Birth: 17-Sep-1930

## 2019-02-26 ENCOUNTER — Ambulatory Visit: Payer: Medicare Other

## 2019-02-26 ENCOUNTER — Other Ambulatory Visit: Payer: Self-pay

## 2019-02-26 DIAGNOSIS — M6281 Muscle weakness (generalized): Secondary | ICD-10-CM

## 2019-02-26 DIAGNOSIS — M9902 Segmental and somatic dysfunction of thoracic region: Secondary | ICD-10-CM | POA: Diagnosis not present

## 2019-02-26 DIAGNOSIS — M62830 Muscle spasm of back: Secondary | ICD-10-CM | POA: Diagnosis not present

## 2019-02-26 DIAGNOSIS — M25561 Pain in right knee: Secondary | ICD-10-CM | POA: Diagnosis not present

## 2019-02-26 DIAGNOSIS — R262 Difficulty in walking, not elsewhere classified: Secondary | ICD-10-CM | POA: Diagnosis not present

## 2019-02-26 DIAGNOSIS — G8929 Other chronic pain: Secondary | ICD-10-CM | POA: Diagnosis not present

## 2019-02-26 DIAGNOSIS — M25562 Pain in left knee: Secondary | ICD-10-CM

## 2019-02-26 DIAGNOSIS — M79671 Pain in right foot: Secondary | ICD-10-CM | POA: Diagnosis not present

## 2019-02-26 DIAGNOSIS — M5416 Radiculopathy, lumbar region: Secondary | ICD-10-CM | POA: Diagnosis not present

## 2019-02-26 DIAGNOSIS — M79672 Pain in left foot: Secondary | ICD-10-CM

## 2019-02-26 DIAGNOSIS — M5134 Other intervertebral disc degeneration, thoracic region: Secondary | ICD-10-CM | POA: Diagnosis not present

## 2019-02-26 NOTE — Therapy (Signed)
[place] PHYSICAL AND SPORTS MEDICINE [address] [place], [address] Phone: [phone]   Fax:  [phone]  Physical Therapy Treatment  Patient Details  Name: Angel [name] MRN: [id number] Date of Birth: 1930-09-13 Referring Provider (PT): Enid Derry, MD   Encounter Date: 02/26/2019  PT End of Session - 02/26/19 1549    Visit Number  16    Number of Visits  71    Date for PT Re-Evaluation  03/21/19    Authorization Type  6    Authorization Time Period  10    PT Start Time  1549    PT Stop Time  1632    PT Time Calculation (min)  43 min    Activity Tolerance  Patient tolerated treatment well    Behavior During Therapy  [facility] for tasks assessed/performed       Past Medical History:  Diagnosis Date  . Anxiety   . Aortic aneurysm without rupture ([place])   . CAD (coronary artery disease)   . Chronic hip pain   . Endometriosis   . Hearing loss   . HTN, goal below 150/90   . Hyperlipidemia LDL goal <70 11/18/2014  . Osteopenia of the elderly   . Urge and stress incontinence     Past Surgical History:  Procedure Laterality Date  . ABDOMINAL HYSTERECTOMY  1976   total  . APPENDECTOMY    . BREAST EXCISIONAL BIOPSY Left 1987   benign    There were no vitals filed for this visit.  Subjective Assessment - 02/26/19 1551    Subjective  Knees are doing fine. R ankle is a little off today, 2/10 when walking but not bad.    Pertinent History  B knee pain since a year or more. Walking up the steps bothers her. Walking around regularly is no pain. Pt lives in a 2 story home, first floor set up. 14 steps to get to second floor, L banister rail starting a third of the way up.  No steps to enter front and back door.  Gradual onset of B knee pain. Has been to Dr. Marry Guan who did x-rays who said that there is still cartilage in both knees. Was diagnosed with osteoarthritis.  [place]. Walking up hills bothers her knees.  Pt also walks about 20 minutes a day walking her dog.  R knee feels fragile when she starts with it to step up a step therefore she starts with her L, then she can step up with her R LE. Also goes to a chiropracter for adjustments.  Pt also has 5 step to go down to her meditation room, R rail assist in her home.  Has not yet used ice for her knees.   Pt adds that she has B foot pronation and plantar arch pain in which her orthotics that she wears helps her a lot.     Currently in Pain?  Yes    Pain Score  2                                PT Education - 02/26/19 1611    Education Details  ther-ex    Person(s) Educated  Patient    Methods  Explanation;Demonstration;Tactile cues;Verbal cues    Comprehension  Returned demonstration;Verbalized understanding      Objective  MedbridgeAccess Code: [email or web address]   No latex band allergies Blood pressure is controlled per pt.  Aortic aneurism osteopenia  Pt demonstrates B foot pronation. Pt states wearing orthotics  Pt states that she got a letter from Dr. Sanda Klein stating that she is leaving the practice due to health issues. Pt was recommended to see their nurse practitioner [name]   Pt states that getting up from the couch is hard for her.      Therapeutic exercise  gait with 10 lbs weight on R side for counter balance 32 ft x 3    Decreased R lateral ankle pain  Forward wedding march red band above knees 32 ft x 2. Decreased R lateral ankle pain.   Side stepping red band 32 ft to the R and 32 ft to the L   No ankle pain afterwards  Seated knee flexion targeting medial hamstrings red band  R 10x3  L 10x3  Seated manually resisted hip extension  R 10x3 L 10x3  Standing hip machine height 2              Hip extension                          R plate 40 for 18H6                         L plate 40 for  31S9              Hip abduction                          R plate 10 for 70Y6,                          L plate 10 for 37C5   Standing gastroc stretch at stair step with B UE assist 20- 30 seconds x 3  Seated manually resisted R ankle IV to help decrease lateral ankle pain 10x   Improved exercise technique, movement at target joints, use of target muscles aftermin tomod verbal, visual, tactile cues.   Response to treatment Good muscle use felt with exercises.Pt tolerated session well without aggravation of symptoms.   Clinical impression Pt demonstrates increased R foot pronation with gait resulting in increased lateral foot pain. Decreased symptoms with treatment to promote glute med and max activation as well as femoral control to decrease foot pronation. Continued working on LE strengthening as well in positions comfortable for her knees to promote ability to negotiate stairs when necessary. No complain of increased knee pain throughout session. Pt will benefit from continued skilled physical therapy services to decrease pain, improve LE mechanics, improve function.       PT Short Term Goals - 09/20/18 1905      PT SHORT TERM GOAL #1   Title  Patient will be independent with her HEP to decrease pain, improve strength and function.     Time  3    Period  Weeks    Status  On-going    Target Date  10/11/18        PT Long Term Goals - 02/07/19 1603      PT LONG TERM GOAL #1   Title  Patient will have a decrease in B knee pain to 2/10 or less at worst to decrease difficulty negotiating stairs or climbing up hills.  Baseline  R knee 5/10 , L knee 4.5/10 at worst for the past 3 months (07/16/2018); 6/10 R knee pain at most, 4.5/10 L knee pain at most for the past 7 days (09/20/2018); 3/10 B knee pain at worst for the past 7 days (09/27/2018); 3/10 B knee pain at worst for the past 2 weeks (12/25/2018). 1.5/10 R knee pain (except with stairs), 1.5/10 L knee pain at most  for the past 7 days except with steps/stairs (01/22/2019); 3.5/10 for both knees at most for the past 7 days going up and down steps. 2/10 at most B knees without the stairs (02/07/2019)    Time  6    Period  Weeks    Status  Partially Met    Target Date  03/21/19      PT LONG TERM GOAL #2   Title  Patient will be able to ascend and descend 4 regular steps with bilateral UE assist at least 2 x with minimal to no complain of B knee pain.     Baseline  Increased B knee pain with ascending and descending 4 regular steps with B UE assist (07/16/2018). Still has pain but feels slight improvement (09/20/2018); increased B knee pain with stairs (12/25/2018), (01/22/2019)    Time  6    Period  Weeks    Status  On-going      PT LONG TERM GOAL #3   Title  Patient will improve B hip extension and abduction strengh by at least 1/2 MMT to promote ability to negotiate stairs and climb hills with less pain.     Time  6    Period  Weeks    Status  Achieved    Target Date  02/07/19      PT LONG TERM GOAL #4   Title  Patient will improve her 6 minute walk distance by at least 100 ft to promote mobility.     Baseline  1045 ft with L lateral hip pain/discomfort (07/16/2018); 1082 ft (09/20/2018); 910 ft (12/25/2018); 1008 ft, no pain or discomfort (01/22/2019); 1063 ft (02/07/2019)    Time  6    Period  Weeks    Status  On-going    Target Date  03/21/19      PT LONG TERM GOAL #5   Title  Patient will have a decrease in R and L foot pain to 2/10 or less at worst to promote ability to ambulate with less difficulty.     Baseline  5/10 R foot pain, 3/10 L foot pain at most for the past 2 weeks (12/25/2018); 3.5/10 R foot and L foot pain at most for the past 7 days (01/22/2019); 2.5/10 B foot pain at most for the past 7 days (02/07/2019)    Time  6    Period  Weeks    Status  Partially Met    Target Date  03/21/19            Plan - 02/26/19 1547    Clinical Impression Statement  Pt demonstrates increased R foot  pronation with gait resulting in increased lateral foot pain. Decreased symptoms with treatment to promote glute med and max activation as well as femoral control to decrease foot pronation. Continued working on LE strengthening as well in positions comfortable for her knees to promote ability to negotiate stairs when necessary. No complain of increased knee pain throughout session. Pt will benefit from continued skilled physical therapy services to decrease pain, improve LE mechanics, improve function.  Rehab Potential  Fair    PT Frequency  2x / week    PT Duration  6 weeks    PT Treatment/Interventions  Aquatic Therapy;Electrical Stimulation;Iontophoresis 9m/ml Dexamethasone;Gait training;Stair training;Therapeutic activities;Therapeutic exercise;Balance training;Neuromuscular re-education;Patient/family education;Manual techniques;Dry needling    PT Next Visit Plan  hip strengthening, femoral control, manual techniques, modalities PRN    PT Home Exercise Plan  no updates this session    Consulted and Agree with Plan of Care  Patient       Patient will benefit from skilled therapeutic intervention in order to improve the following deficits and impairments:  Pain, Postural dysfunction, Improper body mechanics, Decreased strength, Decreased range of motion  Visit Diagnosis: 1. Muscle weakness (generalized)   2. Pain in right foot   3. Difficulty in walking, not elsewhere classified   4. Chronic pain of right knee   5. Chronic pain of left knee   6. Pain in left foot        Problem List Patient Active Problem List   Diagnosis Date Noted  . Arthritis of knee, degenerative 05/22/2018  . Cataract, right eye 05/17/2018  . Aortic stenosis, mild 02/22/2018  . Obesity (BMI 30.0-34.9) 01/18/2018  . Medication monitoring encounter 12/30/2015  . Productive cough 11/02/2015  . Skin lesion 08/04/2015  . 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 Hip pain, chronic 04/02/2015  . Hearing loss of both ears  04/02/2015  . Osteopenia of the elderly 04/02/2015  . Hormone replacement therapy (postmenopausal) 04/02/2015  . Aortic aneurysm without rupture ([place] 04/02/2015  . Situational anxiety 04/02/2015  . Dermatitis 04/02/2015  . Hyperlipidemia LDL goal <70 11/18/2014  . Incomplete bladder emptying 06/29/2014  . Mixed stress and urge urinary incontinence 06/29/2014  . Urge incontinence of urine 06/29/2014  . Hypertension goal BP (blood pressure) < 140/90 11/06/2013  . CAD (coronary artery disease) 11/06/2013    MJoneen BoersPT, DPT   02/26/2019, 4:49 PM  [facility] [address] [place] [address]Phone: [phone]  Fax:  [phone] Name: Angel KLECKERMRN: [id number] of Birth: [birth year]

## 2019-02-27 ENCOUNTER — Encounter: Payer: Self-pay | Admitting: Nurse Practitioner

## 2019-02-27 ENCOUNTER — Ambulatory Visit (INDEPENDENT_AMBULATORY_CARE_PROVIDER_SITE_OTHER): Payer: Medicare Other | Admitting: Nurse Practitioner

## 2019-02-27 DIAGNOSIS — I2510 Atherosclerotic heart disease of native coronary artery without angina pectoris: Secondary | ICD-10-CM

## 2019-02-27 DIAGNOSIS — M8580 Other specified disorders of bone density and structure, unspecified site: Secondary | ICD-10-CM | POA: Diagnosis not present

## 2019-02-27 DIAGNOSIS — I10 Essential (primary) hypertension: Secondary | ICD-10-CM | POA: Diagnosis not present

## 2019-02-27 DIAGNOSIS — I719 Aortic aneurysm of unspecified site, without rupture: Secondary | ICD-10-CM | POA: Diagnosis not present

## 2019-02-27 DIAGNOSIS — Z79890 Hormone replacement therapy (postmenopausal): Secondary | ICD-10-CM

## 2019-02-27 DIAGNOSIS — E785 Hyperlipidemia, unspecified: Secondary | ICD-10-CM | POA: Diagnosis not present

## 2019-02-27 MED ORDER — ESTROGENS CONJUGATED 0.3 MG PO TABS: ORAL_TABLET | ORAL | 1 refills | Status: DC

## 2019-02-27 NOTE — Progress Notes (Addendum)
Virtual Visit via Telephone Note  I connected with Angel [name] on 02/27/19 at  2:40 PM EDT by telephone and verified that I am speaking with the correct person using two identifiers.   Staff discussed the limitations, risks, security and privacy concerns of performing an evaluation and management service by telephone and the availability of in person appointments. Staff also discussed with the patient that there may be a patient responsible charge related to this service. The patient expressed understanding and agreed to proceed.  Patients location: home My location: work office  Other people in meeting: none    HPI  Hypertension Patient is on amlodipine 5mg , lisinopril 40mg , and metoprolol 50mg  daily  Takes medications as prescribed with no missed doses a month.  She is not very compliant with low-salt diet.  She has CAD and aortic aneuyrsym- followed by cardiologist Dr. Tamala Julian.  Denies chest pain, headaches, blurry vision. BP Readings from Last 3 Encounters:  02/13/19 132/72  06/07/18 117/78  05/17/18 124/78    Hyperlipidemia Patient rx atorvastatin 40mg  daily. Takes medications as prescribed with no missed doses a month.  Diet: has been working recently to cut down on carbs,  Breakfast: Cereal, nuts, fruits, coffee Lunch: Salad Dinner:biggest meal, typically protein and was carb heavy in the past but has been working on improving this Denies myalgias Lab Results  Component Value Date   CHOL 179 02/13/2019   HDL 42 02/13/2019   LDLCALC 74 02/13/2019   TRIG 314 (H) 02/13/2019   CHOLHDL 4.3 02/13/2019   Osteopenia Takes vitamin D and calcium supplementation Weight bearing exercises- working with physical therapy due to osteoarthritis; takes glucosamine-chondroitin supplementation  Postmenopausal She takes premarin- started after hysterectomy, we have been working on titration down dosage. She is taking 0.3mg  twice a week now. She is requesting to stay on this dose.  States this helps her with energy. No hot flashes or vaginal dryness.   PHQ2/9: Depression screen [facility] 2/9 02/27/2019 05/17/2018 01/18/2018 06/01/2017 12/02/2016  Decreased Interest 0 0 0 0 0  Down, Depressed, Hopeless 0 0 0 0 0  PHQ - 2 Score 0 0 0 0 0  Altered sleeping 0 - - - -  Tired, decreased energy 0 - - - -  Change in appetite 0 - - - -  Feeling bad or failure about yourself  0 - - - -  Trouble concentrating 0 - - - -  Moving slowly or fidgety/restless 0 - - - -  Suicidal thoughts 0 - - - -  PHQ-9 Score 0 - - - -  Difficult doing work/chores Not difficult at all - - - -     PHQ reviewed. Negative  Patient Active Problem List   Diagnosis Date Noted  . Arthritis of knee, degenerative 05/22/2018  . Cataract, right eye 05/17/2018  . Aortic stenosis, mild 02/22/2018  . Obesity (BMI 30.0-34.9) 01/18/2018  . Medication monitoring encounter 12/30/2015  . Productive cough 11/02/2015  . Skin lesion 08/04/2015  . 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 Hip pain, chronic 04/02/2015  . Hearing loss of both ears 04/02/2015  . Osteopenia of the elderly 04/02/2015  . Hormone replacement therapy (postmenopausal) 04/02/2015  . Aortic aneurysm without rupture ([place]) 04/02/2015  . Situational anxiety 04/02/2015  . Dermatitis 04/02/2015  . Hyperlipidemia LDL goal <70 11/18/2014  . Incomplete bladder emptying 06/29/2014  . Mixed stress and urge urinary incontinence 06/29/2014  . Urge incontinence of urine 06/29/2014  . Hypertension goal BP (blood pressure) < 140/90 11/06/2013  .  CAD (coronary artery disease) 11/06/2013    Past Medical History:  Diagnosis Date  . Anxiety   . Aortic aneurysm without rupture ([place])   . CAD (coronary artery disease)   . Chronic hip pain   . Endometriosis   . Hearing loss   . HTN, goal below 150/90   . Hyperlipidemia LDL goal <70 11/18/2014  . Osteopenia of the elderly   . Urge and stress incontinence     Past Surgical History:  Procedure Laterality Date  .  ABDOMINAL HYSTERECTOMY  1976   total  . APPENDECTOMY    . BREAST EXCISIONAL BIOPSY Left 1987   benign    Social History   Tobacco Use  . Smoking status: Never Smoker  . Smokeless tobacco: Never Used  Substance Use Topics  . Alcohol use: Yes    Alcohol/week: 1.0 standard drinks    Types: 1 Cans of beer per week    Comment: occasional     Current Outpatient Medications:  .  amLODipine (NORVASC) 5 MG tablet, TAKE 1 TABLET BY MOUTH DAILY, Disp: 90 tablet, Rfl: 3 .  Ascorbic Acid (VITAMIN C) 100 MG tablet, Take 100 mg by mouth daily., Disp: , Rfl:  .  atorvastatin (LIPITOR) 40 MG tablet, TAKE ONE TABLET BY MOUTH EVERY NIGHT AT BEDTIME, Disp: 90 tablet, Rfl: 3 .  clobetasol cream (TEMOVATE) 9.37 %, Apply 1 application topically 2 (two) times daily as needed (rashes)., Disp: , Rfl:  .  estrogens, conjugated, (PREMARIN) 0.3 MG tablet, Take one-half of a tablet by mouth twice a week, Disp: 13 tablet, Rfl: 0 .  glucosamine-chondroitin 500-400 MG tablet, Take 1 tablet by mouth daily., Disp: , Rfl:  .  lisinopril (ZESTRIL) 40 MG tablet, Take 40 mg by mouth daily., Disp: , Rfl:  .  metoprolol succinate (TOPROL-XL) 50 MG 24 hr tablet, TAKE 1 TABLET BY MOUTH DAILY WITH OR IMMEDIATELY FOLLOWING A MEAL, Disp: 90 tablet, Rfl: 3 .  Multiple Vitamin (MULTIVITAMIN) capsule, Take 1 capsule by mouth daily., Disp: , Rfl:  .  vitamin B-12 (CYANOCOBALAMIN) 100 MCG tablet, Take 100 mcg by mouth daily., Disp: , Rfl:  .  VITAMIN D, ERGOCALCIFEROL, PO, Take 1,000 Units by mouth daily., Disp: , Rfl:   Allergies  Allergen Reactions  . Myrbetriq [Mirabegron] Other (See Comments)    Severe depression  . Codeine Other (See Comments)    "zoned out"  . Levofloxacin Other (See Comments)    AMS  . Prednisone Other (See Comments)    AMS  . Sulfa Antibiotics Other (See Comments)    AMS    Review of Systems  Constitutional: Negative for chills, fever and malaise/fatigue.  Respiratory: Negative for cough and  shortness of breath.   Cardiovascular: Negative for chest pain, palpitations and leg swelling.  Gastrointestinal: Negative for abdominal pain.  Musculoskeletal: Negative for joint pain and myalgias.  Skin: Negative for rash.  Neurological: Negative for dizziness and headaches.  Psychiatric/Behavioral: The patient is not nervous/anxious and does not have insomnia.       No other specific complaints in a complete review of systems (except as listed in HPI above).  Objective  There were no vitals filed for this visit.   There is no height or weight on file to calculate BMI.  Patient is alert, able to speak in full sentences without difficulty.   Assessment & Plan  1. Hypertension goal BP (blood pressure) < 140/90 Stable, continue meds  2. Coronary artery disease involving native coronary artery  of native heart without angina pectoris Stable, continue follow-up with cards   3. Aortic aneurysm without rupture, unspecified portion of aorta (HCC) Stable, continue follow-up with cards   4. Osteopenia of the elderly Supplementation, weight bearing exercises   5. Hyperlipidemia LDL goal <70 Stable, continue follow-up with cards   6. Hormone replacement therapy (postmenopausal) Discussed risks of HRT patient aware and would like to continue  - estrogens, conjugated, (PREMARIN) 0.3 MG tablet; Take one-half of a tablet by mouth twice a week  Dispense: 12 tablet; Refill: 1    I discussed the assessment and treatment plan with the patient. The patient was provided an opportunity to ask questions and all were answered. The patient agreed with the plan and demonstrated an understanding of the instructions.   The patient was advised to call back or seek an in-person evaluation if the symptoms worsen or if the condition fails to improve as anticipated.  I provided 22 minutes of non-face-to-face time during this encounter. Including chart review.    Fredderick Severance, NP

## 2019-02-28 ENCOUNTER — Ambulatory Visit: Payer: Medicare Other

## 2019-02-28 ENCOUNTER — Other Ambulatory Visit: Payer: Self-pay

## 2019-02-28 DIAGNOSIS — M79671 Pain in right foot: Secondary | ICD-10-CM

## 2019-02-28 DIAGNOSIS — M25561 Pain in right knee: Secondary | ICD-10-CM | POA: Diagnosis not present

## 2019-02-28 DIAGNOSIS — M25562 Pain in left knee: Secondary | ICD-10-CM

## 2019-02-28 DIAGNOSIS — G8929 Other chronic pain: Secondary | ICD-10-CM | POA: Diagnosis not present

## 2019-02-28 DIAGNOSIS — R262 Difficulty in walking, not elsewhere classified: Secondary | ICD-10-CM | POA: Diagnosis not present

## 2019-02-28 DIAGNOSIS — M6281 Muscle weakness (generalized): Secondary | ICD-10-CM

## 2019-02-28 DIAGNOSIS — M79672 Pain in left foot: Secondary | ICD-10-CM

## 2019-02-28 NOTE — Therapy (Signed)
[facility] [address] [place], [address] Phone: [phone]   Fax:  [phone]  Physical Therapy Treatment  Patient Details  Name: Angel [name] MRN: [id number] Date of Birth: 07/08/1931 Referring Provider (PT): Enid Derry, MD   Encounter Date: 02/28/2019  PT End of Session - 02/28/19 1558    Visit Number  17    Number of Visits  28    Date for PT Re-Evaluation  03/21/19    Authorization Type  7    Authorization Time Period  10    PT Start Time  1600    PT Stop Time  1641    PT Time Calculation (min)  41 min    Activity Tolerance  Patient tolerated treatment well    Behavior During Therapy  [facility] for tasks assessed/performed       Past Medical History:  Diagnosis Date  . Anxiety   . Aortic aneurysm without rupture ([place])   . CAD (coronary artery disease)   . Chronic hip pain   . Endometriosis   . Hearing loss   . HTN, goal below 150/90   . Hyperlipidemia LDL goal <70 11/18/2014  . Osteopenia of the elderly   . Urge and stress incontinence     Past Surgical History:  Procedure Laterality Date  . ABDOMINAL HYSTERECTOMY  1976   total  . APPENDECTOMY    . BREAST EXCISIONAL BIOPSY Left 1987   benign    There were no vitals filed for this visit.  Subjective Assessment - 02/28/19 1601    Subjective  2/10 B knee pain. No B ankle pain when walking currently.    Pertinent History  B knee pain since a year or more. Walking up the steps bothers her. Walking around regularly is no pain. Pt lives in a 2 story home, first floor set up. 14 steps to get to second floor, L banister rail starting a third of the way up.  No steps to enter front and back door.  Gradual onset of B knee pain. Has been to Dr. Marry Guan who did x-rays who said that there is still cartilage in both knees. Was diagnosed with osteoarthritis.  [place]. Walking up hills  bothers her knees.  Pt also walks about 20 minutes a day walking her dog.  R knee feels fragile when she starts with it to step up a step therefore she starts with her L, then she can step up with her R LE. Also goes to a chiropracter for adjustments.  Pt also has 5 step to go down to her meditation room, R rail assist in her home.  Has not yet used ice for her knees.   Pt adds that she has B foot pronation and plantar arch pain in which her orthotics that she wears helps her a lot.     Currently in Pain?  Yes    Pain Score  2                                PT Education - 02/28/19 1616    Education Details  ther-ex    Person(s) Educated  Patient    Methods  Explanation;Demonstration;Tactile cues;Verbal cues    Comprehension  Returned demonstration;Verbalized understanding        Objective  MedbridgeAccess Code: [email or web address]  No latex band allergies Blood pressure is controlled per pt.  Aortic aneurism osteopenia  Pt demonstrates B foot pronation. Pt states wearing orthotics  Pt states that she got a letter from Dr. Sanda Klein stating that she is leaving the practice due to health issues. Pt was recommended to see their nurse practitioner [name]   Pt states that getting up from the couch is hard for her.      Therapeutic exercise  Forward wedding march red band above knees 32 ft x 4.    Side stepping red band 32 ft to the R and 32 ft to the L fpr 2 sets  standing hip extension with red band around thighs and B UE assist   R 10x3  L 10x3  Standing hip abduction with red band around thighs and B UE assist   R 10x3  L 10x3  ascending and descending 4 regular steps with B UE assist and red band around thighs, emphasis on femoral control 2x  Decreased knee pain initially when ascending but pain returned.    Then without band after manual therapy 2x with B UE assist. Significant decrease in B knee pain  seated hip  adduction ball squeeze 10x5 seconds for 2 sets   Improved exercise technique, movement at target joints, use of target muscles after mod verbal, visual, tactile cues.     Manual therapy  Seated STM bilateral vastus lateralis, lateral hamstrings, iliotibial band   Response to treatment Good muscle use felt with exercises.Pt tolerated session well without aggravation of symptoms.   Clinical impression Decreased B knee pain with ascending and descending stairs with treatment to decrease B vastus lateralis, lateral hamstrings, and iliotibial band tension as well promoting femoral control. Pt will benefit from continued skilled physical therapy services to decrease knee pain, improve strength, mechanics, and function.        PT Short Term Goals - 09/20/18 1905      PT SHORT TERM GOAL #1   Title  Patient will be independent with her HEP to decrease pain, improve strength and function.     Time  3    Period  Weeks    Status  On-going    Target Date  10/11/18        PT Long Term Goals - 02/07/19 1603      PT LONG TERM GOAL #1   Title  Patient will have a decrease in B knee pain to 2/10 or less at worst to decrease difficulty negotiating stairs or climbing up hills.     Baseline  R knee 5/10 , L knee 4.5/10 at worst for the past 3 months (07/16/2018); 6/10 R knee pain at most, 4.5/10 L knee pain at most for the past 7 days (09/20/2018); 3/10 B knee pain at worst for the past 7 days (09/27/2018); 3/10 B knee pain at worst for the past 2 weeks (12/25/2018). 1.5/10 R knee pain (except with stairs), 1.5/10 L knee pain at most for the past 7 days except with steps/stairs (01/22/2019); 3.5/10 for both knees at most for the past 7 days going up and down steps. 2/10 at most B knees without the stairs (02/07/2019)    Time  6    Period  Weeks    Status  Partially Met    Target Date  03/21/19      PT LONG TERM GOAL #2   Title  Patient will be able to ascend and descend 4 regular steps with  bilateral  UE assist at least 2 x with minimal to no complain of B knee pain.     Baseline  Increased B knee pain with ascending and descending 4 regular steps with B UE assist (07/16/2018). Still has pain but feels slight improvement (09/20/2018); increased B knee pain with stairs (12/25/2018), (01/22/2019)    Time  6    Period  Weeks    Status  On-going      PT LONG TERM GOAL #3   Title  Patient will improve B hip extension and abduction strengh by at least 1/2 MMT to promote ability to negotiate stairs and climb hills with less pain.     Time  6    Period  Weeks    Status  Achieved    Target Date  02/07/19      PT LONG TERM GOAL #4   Title  Patient will improve her 6 minute walk distance by at least 100 ft to promote mobility.     Baseline  1045 ft with L lateral hip pain/discomfort (07/16/2018); 1082 ft (09/20/2018); 910 ft (12/25/2018); 1008 ft, no pain or discomfort (01/22/2019); 1063 ft (02/07/2019)    Time  6    Period  Weeks    Status  On-going    Target Date  03/21/19      PT LONG TERM GOAL #5   Title  Patient will have a decrease in R and L foot pain to 2/10 or less at worst to promote ability to ambulate with less difficulty.     Baseline  5/10 R foot pain, 3/10 L foot pain at most for the past 2 weeks (12/25/2018); 3.5/10 R foot and L foot pain at most for the past 7 days (01/22/2019); 2.5/10 B foot pain at most for the past 7 days (02/07/2019)    Time  6    Period  Weeks    Status  Partially Met    Target Date  03/21/19            Plan - 02/28/19 1557    Clinical Impression Statement  Decreased B knee pain with ascending and descending stairs with treatment to decrease B vastus lateralis, lateral hamstrings, and iliotibial band tension as well promoting femoral control. Pt will benefit from continued skilled physical therapy services to decrease knee pain, improve strength, mechanics, and function.    Rehab Potential  Fair    PT Frequency  2x / week    PT Duration  6 weeks     PT Treatment/Interventions  Aquatic Therapy;Electrical Stimulation;Iontophoresis 63m/ml Dexamethasone;Gait training;Stair training;Therapeutic activities;Therapeutic exercise;Balance training;Neuromuscular re-education;Patient/family education;Manual techniques;Dry needling    PT Next Visit Plan  hip strengthening, femoral control, manual techniques, modalities PRN    PT Home Exercise Plan  no updates this session    Consulted and Agree with Plan of Care  Patient       Patient will benefit from skilled therapeutic intervention in order to improve the following deficits and impairments:  Pain, Postural dysfunction, Improper body mechanics, Decreased strength, Decreased range of motion  Visit Diagnosis: 1. Pain in right foot   2. Muscle weakness (generalized)   3. Difficulty in walking, not elsewhere classified   4. Chronic pain of right knee   5. Chronic pain of left knee   6. Pain in left foot        Problem List Patient Active Problem List   Diagnosis Date Noted  . Arthritis of knee, degenerative 05/22/2018  . Cataract, right eye 05/17/2018  .  Aortic stenosis, mild 02/22/2018  . Obesity (BMI 30.0-34.9) 01/18/2018  . Hip pain, chronic 04/02/2015  . Hearing loss of both ears 04/02/2015  . Osteopenia of the elderly 04/02/2015  . Hormone replacement therapy (postmenopausal) 04/02/2015  . Aortic aneurysm without rupture ([place]) 04/02/2015  . Situational anxiety 04/02/2015  . Hyperlipidemia LDL goal <70 11/18/2014  . Mixed stress and urge urinary incontinence 06/29/2014  . Hypertension goal BP (blood pressure) < 140/90 11/06/2013  . CAD (coronary artery disease) 11/06/2013    Joneen Boers PT, DPT   02/28/2019, 6:22 PM  [facility] [address] [place], [address] Phone: [phone]   Fax:  [phone]  Name: Angel [name] MRN: [id number] Date of Birth: Nov 20, 1930

## 2019-03-05 ENCOUNTER — Other Ambulatory Visit: Payer: Self-pay

## 2019-03-05 ENCOUNTER — Ambulatory Visit: Payer: Medicare Other

## 2019-03-05 DIAGNOSIS — G8929 Other chronic pain: Secondary | ICD-10-CM | POA: Diagnosis not present

## 2019-03-05 DIAGNOSIS — M79672 Pain in left foot: Secondary | ICD-10-CM

## 2019-03-05 DIAGNOSIS — M25562 Pain in left knee: Secondary | ICD-10-CM

## 2019-03-05 DIAGNOSIS — M79671 Pain in right foot: Secondary | ICD-10-CM | POA: Diagnosis not present

## 2019-03-05 DIAGNOSIS — M25561 Pain in right knee: Secondary | ICD-10-CM | POA: Diagnosis not present

## 2019-03-05 DIAGNOSIS — R262 Difficulty in walking, not elsewhere classified: Secondary | ICD-10-CM

## 2019-03-05 DIAGNOSIS — M6281 Muscle weakness (generalized): Secondary | ICD-10-CM

## 2019-03-05 NOTE — Therapy (Signed)
[facility] [address], [place], [address] Phone: [phone]   Fax:  [phone]  Physical Therapy Treatment  Patient Details  Name: Angel [name] MRN: [id number] Date of Birth: [birth year] Referring Provider (PT): Enid Derry, MD   Encounter Date: 03/05/2019  PT End of Session - 03/05/19 1550    Visit Number  18    Number of Visits  49    Date for PT Re-Evaluation  03/21/19    Authorization Type  8    Authorization Time Period  10    PT Start Time  1287   pt arrived late   PT Stop Time  1615    PT Time Calculation (min)  25 min    Activity Tolerance  Patient tolerated treatment well    Behavior During Therapy  [facility] for tasks assessed/performed       Past Medical History:  Diagnosis Date  . Anxiety   . Aortic aneurysm without rupture ([place])   . CAD (coronary artery disease)   . Chronic hip pain   . Endometriosis   . Hearing loss   . HTN, goal below 150/90   . Hyperlipidemia LDL goal <70 11/18/2014  . Osteopenia of the elderly   . Urge and stress incontinence     Past Surgical History:  Procedure Laterality Date  . ABDOMINAL HYSTERECTOMY  1976   total  . APPENDECTOMY    . BREAST EXCISIONAL BIOPSY Left 1987   benign    There were no vitals filed for this visit.  Subjective Assessment - 03/05/19 1550    Subjective  Knees and feet feel pretty good, 1.5/10 for everything.    Pertinent History  B knee pain since a year or more. Walking up the steps bothers her. Walking around regularly is no pain. Pt lives in a 2 story home, first floor set up. 14 steps to get to second floor, L banister rail starting a third of the way up.  No steps to enter front and back door.  Gradual onset of B knee pain. Has been to Dr. Marry Guan who did x-rays who said that there is still cartilage in both knees. Was diagnosed with osteoarthritis.  [place]  Walking up hills bothers her knees.  Pt also walks about 20 minutes a day walking her dog.  R knee feels fragile when she starts with it to step up a step therefore she starts with her L, then she can step up with her R LE. Also goes to a chiropracter for adjustments.  Pt also has 5 step to go down to her meditation room, R rail assist in her home.  Has not yet used ice for her knees.   Pt adds that she has B foot pronation and plantar arch pain in which her orthotics that she wears helps her a lot.     Currently in Pain?  Yes    Pain Score  2    1.5/10                              PT Education - 03/05/19 1601    Education Details  ther-ex    Person(s) Educated  Patient    Methods  Explanation;Demonstration;Tactile cues;Verbal cues    Comprehension  Returned demonstration;Verbalized understanding         Objective  MedbridgeAccess Code: [email or web address]   No latex band allergies Blood pressure is controlled per pt.  Aortic aneurism osteopenia  Pt demonstrates B foot pronation. Pt states wearing orthotics  Pt states that she got a letter from Dr. Sanda Klein stating that she is leaving the practice due to health issues. Pt was recommended to see their nurse practitioner [name]   Pt states that getting up from the couch is hard for her.      Therapeutic exercise   Standing hip abduction with red band around thighs and B UE assist              R 10x3             L 10x3  standing hip extension with red band around thighs and B UE assist              R 10x3             L 10x3  Forward wedding march red band above knees 20 ft x 4.   Side stepping red band 32 ft to the R and 32 ft to the L for 2 sets  Ascending and descending 4 regular steps with B UE assist and red band around thighs, emphasis on femoral control 2x   seated hip adduction ball squeeze 10x, then 10x10 seconds  Decreased B knee pain with stair  negotiation  Ascending and descending 4 regular steps with B UE assist, emphasis on femoral control 2x after seated hip adduction exercise.   Decreased knee pain with stair negotiation    Improved exercise technique, movement at target joints, use of target muscles after min to mod verbal, visual, tactile cues.     Response to treatment Good muscle use felt with exercises.Pt tolerated session well without aggravation of symptoms.   Clinical impression Continued working on glute med and max strengthening, and vastus medialis muscle strengthening and femoral control to promote better mechanics at her knees with closed chain activities such as stair negotiation to improve comfort level when performing tasks. Pt will benefit from continued skilled physical therapy to improve LE strength mechanics, decrease pain and improve function.     PT Short Term Goals - 09/20/18 1905      PT SHORT TERM GOAL #1   Title  Patient will be independent with her HEP to decrease pain, improve strength and function.     Time  3    Period  Weeks    Status  On-going    Target Date  10/11/18        PT Long Term Goals - 02/07/19 1603      PT LONG TERM GOAL #1   Title  Patient will have a decrease in B knee pain to 2/10 or less at worst to decrease difficulty negotiating stairs or climbing up hills.     Baseline  R knee 5/10 , L knee 4.5/10 at worst for the past 3 months (07/16/2018); 6/10 R knee pain at most, 4.5/10 L knee pain at most for the past 7 days (09/20/2018); 3/10 B knee pain at worst for the past 7 days (09/27/2018); 3/10 B knee pain at worst for the past 2 weeks (12/25/2018). 1.5/10 R knee pain (except with stairs), 1.5/10 L knee pain at most for the past 7 days except with steps/stairs (01/22/2019); 3.5/10 for both knees at most for the past 7 days going up and down steps. 2/10 at most B knees without the stairs (02/07/2019)  Time  6    Period  Weeks    Status  Partially Met    Target Date   03/21/19      PT LONG TERM GOAL #2   Title  Patient will be able to ascend and descend 4 regular steps with bilateral UE assist at least 2 x with minimal to no complain of B knee pain.     Baseline  Increased B knee pain with ascending and descending 4 regular steps with B UE assist (07/16/2018). Still has pain but feels slight improvement (09/20/2018); increased B knee pain with stairs (12/25/2018), (01/22/2019)    Time  6    Period  Weeks    Status  On-going      PT LONG TERM GOAL #3   Title  Patient will improve B hip extension and abduction strengh by at least 1/2 MMT to promote ability to negotiate stairs and climb hills with less pain.     Time  6    Period  Weeks    Status  Achieved    Target Date  02/07/19      PT LONG TERM GOAL #4   Title  Patient will improve her 6 minute walk distance by at least 100 ft to promote mobility.     Baseline  1045 ft with L lateral hip pain/discomfort (07/16/2018); 1082 ft (09/20/2018); 910 ft (12/25/2018); 1008 ft, no pain or discomfort (01/22/2019); 1063 ft (02/07/2019)    Time  6    Period  Weeks    Status  On-going    Target Date  03/21/19      PT LONG TERM GOAL #5   Title  Patient will have a decrease in R and L foot pain to 2/10 or less at worst to promote ability to ambulate with less difficulty.     Baseline  5/10 R foot pain, 3/10 L foot pain at most for the past 2 weeks (12/25/2018); 3.5/10 R foot and L foot pain at most for the past 7 days (01/22/2019); 2.5/10 B foot pain at most for the past 7 days (02/07/2019)    Time  6    Period  Weeks    Status  Partially Met    Target Date  03/21/19            Plan - 03/05/19 1601    Clinical Impression Statement  Continued working on glute med and max strengthening, and vastus medialis muscle strengthening and femoral control to promote better mechanics at her knees with closed chain activities such as stair negotiation to improve comfort level when performing tasks. Pt will benefit from continued  skilled physical therapy to improve LE strength mechanics, decrease pain and improve function.    Rehab Potential  Fair    PT Frequency  2x / week    PT Duration  6 weeks    PT Treatment/Interventions  Aquatic Therapy;Electrical Stimulation;Iontophoresis 3m/ml Dexamethasone;Gait training;Stair training;Therapeutic activities;Therapeutic exercise;Balance training;Neuromuscular re-education;Patient/family education;Manual techniques;Dry needling    PT Next Visit Plan  hip strengthening, femoral control, manual techniques, modalities PRN    PT Home Exercise Plan  no updates this session    Consulted and Agree with Plan of Care  Patient       Patient will benefit from skilled therapeutic intervention in order to improve the following deficits and impairments:  Pain, Postural dysfunction, Improper body mechanics, Decreased strength, Decreased range of motion  Visit Diagnosis: 1. Pain in right foot   2. Muscle weakness (generalized)  3. Difficulty in walking, not elsewhere classified   4. Chronic pain of right knee   5. Chronic pain of left knee   6. Pain in left foot        Problem List Patient Active Problem List   Diagnosis Date Noted  . Arthritis of knee, degenerative 05/22/2018  . Cataract, right eye 05/17/2018  . Aortic stenosis, mild 02/22/2018  . Obesity (BMI 30.0-34.9) 01/18/2018  . Hip pain, chronic 04/02/2015  . Hearing loss of both ears 04/02/2015  . Osteopenia of the elderly 04/02/2015  . Hormone replacement therapy (postmenopausal) 04/02/2015  . Aortic aneurysm without rupture ([place]) 04/02/2015  . Situational anxiety 04/02/2015  . Hyperlipidemia LDL goal <70 11/18/2014  . Mixed stress and urge urinary incontinence 06/29/2014  . Hypertension goal BP (blood pressure) < 140/90 11/06/2013  . CAD (coronary artery disease) 11/06/2013    Joneen Boers PT, DPT  03/05/2019, 5:20 PM  [facility] [address], [place], [address] Phone: [phone]   Fax:  [phone]  Name: Angel [name] MRN: [id number] Date of Birth: 09-06-1930

## 2019-03-07 ENCOUNTER — Other Ambulatory Visit: Payer: Self-pay

## 2019-03-07 ENCOUNTER — Ambulatory Visit: Payer: Medicare Other

## 2019-03-07 DIAGNOSIS — M25561 Pain in right knee: Secondary | ICD-10-CM | POA: Diagnosis not present

## 2019-03-07 DIAGNOSIS — R262 Difficulty in walking, not elsewhere classified: Secondary | ICD-10-CM

## 2019-03-07 DIAGNOSIS — M79672 Pain in left foot: Secondary | ICD-10-CM

## 2019-03-07 DIAGNOSIS — G8929 Other chronic pain: Secondary | ICD-10-CM | POA: Diagnosis not present

## 2019-03-07 DIAGNOSIS — M79671 Pain in right foot: Secondary | ICD-10-CM | POA: Diagnosis not present

## 2019-03-07 DIAGNOSIS — M25562 Pain in left knee: Secondary | ICD-10-CM

## 2019-03-07 DIAGNOSIS — M6281 Muscle weakness (generalized): Secondary | ICD-10-CM | POA: Diagnosis not present

## 2019-03-07 NOTE — Therapy (Signed)
[facility] [address], [place], [address] Phone: [phone]   Fax:  [phone]  Physical Therapy Treatment  Patient Details  Name: Angel [name] MRN: [id number] Date of Birth: [birth year] Referring Provider (PT): Enid Derry, MD   Encounter Date: 03/07/2019  PT End of Session - 03/07/19 1549    Visit Number  19    Number of Visits  41    Date for PT Re-Evaluation  03/21/19    Authorization Type  9    Authorization Time Period  10    PT Start Time  1550    PT Stop Time  1630    PT Time Calculation (min)  40 min    Activity Tolerance  Patient tolerated treatment well    Behavior During Therapy  [facility] for tasks assessed/performed       Past Medical History:  Diagnosis Date  . Anxiety   . Aortic aneurysm without rupture ([place])   . CAD (coronary artery disease)   . Chronic hip pain   . Endometriosis   . Hearing loss   . HTN, goal below 150/90   . Hyperlipidemia LDL goal <70 11/18/2014  . Osteopenia of the elderly   . Urge and stress incontinence     Past Surgical History:  Procedure Laterality Date  . ABDOMINAL HYSTERECTOMY  1976   total  . APPENDECTOMY    . BREAST EXCISIONAL BIOPSY Left 1987   benign    There were no vitals filed for this visit.  Subjective Assessment - 03/07/19 1551    Subjective  No pain when walking today. The exercise with the ball helps her go up and down the steps better. Much better.    Pertinent History  B knee pain since a year or more. Walking up the steps bothers her. Walking around regularly is no pain. Pt lives in a 2 story home, first floor set up. 14 steps to get to second floor, L banister rail starting a third of the way up.  No steps to enter front and back door.  Gradual onset of B knee pain. Has been to Dr. Marry Guan who did x-rays who said that there is still cartilage in both knees. Was diagnosed with osteoarthritis.  [place]. Walking up hills bothers her knees.  Pt also walks about 20 minutes a day walking her dog.  R knee feels fragile when she starts with it to step up a step therefore she starts with her L, then she can step up with her R LE. Also goes to a chiropracter for adjustments.  Pt also has 5 step to go down to her meditation room, R rail assist in her home.  Has not yet used ice for her knees.   Pt adds that she has B foot pronation and plantar arch pain in which her orthotics that she wears helps her a lot.     Currently in Pain?  No/denies    Pain Score  0-No pain                               PT Education - 03/07/19 1552    Education Details  ther-ex    Person(s) Educated  Patient    Methods  Explanation;Demonstration;Tactile cues;Verbal cues    Comprehension  Returned demonstration;Verbalized understanding  Objective  MedbridgeAccess Code: [email or web address]   No latex band allergies Blood pressure is controlled per pt.  Aortic aneurism osteopenia  Pt demonstrates B foot pronation. Pt states wearing orthotics  Pt states that she got a letter from Dr. Sanda Klein stating that she is leaving the practice due to health issues. Pt was recommended to see their nurse practitioner [name]   Pt states that getting up from the couch is hard for her.   Manual therapy  Seated STM B lateral hamstrings, vastus lateralis, and IT band     Therapeutic exercise  seated hip adduction ball squeeze 10x10 seconds x 2  Ascending and descending 4 regular steps with B UE assist, emphasis on femoral control 2x after seated hip adduction exercise and manual therapy   No pain  Seated manually resisted hip extension   R 10x3  L 10x3  Seated manually resisted medial hamstrings flexion   R 10x3 with 5 second holds to decrease R tibial ER  Seated clamshell isometrics, hips less than 90 degrees flexion 10x5 seconds    Improved exercise technique, movement at target joints, use of target muscles after min to mod verbal, visual, tactile cues.      Response to treatment Good muscle use felt with exercises.Pt tolerated session well without aggravation of symptoms.No pain with stair negotiation today.   Clinical impression  Continued working on decreasing lateral thigh tissue tension and improving medial quad strength as well as glute and medial hamstrings strengthening to promote better mechanics at her knees. No pain with stair negotiation today. Pt will benefit from continued skilled physical therapy services to decrease pain, improve strength and function.         PT Short Term Goals - 09/20/18 1905      PT SHORT TERM GOAL #1   Title  Patient will be independent with her HEP to decrease pain, improve strength and function.     Time  3    Period  Weeks    Status  On-going    Target Date  10/11/18        PT Long Term Goals - 02/07/19 1603      PT LONG TERM GOAL #1   Title  Patient will have a decrease in B knee pain to 2/10 or less at worst to decrease difficulty negotiating stairs or climbing up hills.     Baseline  R knee 5/10 , L knee 4.5/10 at worst for the past 3 months (07/16/2018); 6/10 R knee pain at most, 4.5/10 L knee pain at most for the past 7 days (09/20/2018); 3/10 B knee pain at worst for the past 7 days (09/27/2018); 3/10 B knee pain at worst for the past 2 weeks (12/25/2018). 1.5/10 R knee pain (except with stairs), 1.5/10 L knee pain at most for the past 7 days except with steps/stairs (01/22/2019); 3.5/10 for both knees at most for the past 7 days going up and down steps. 2/10 at most B knees without the stairs (02/07/2019)    Time  6    Period  Weeks    Status  Partially Met    Target Date  03/21/19      PT LONG TERM GOAL #2   Title  Patient will be able to ascend and descend 4 regular steps with bilateral UE assist at least 2 x with minimal to no complain of B knee  pain.     Baseline  Increased B knee pain with ascending and descending 4  regular steps with B UE assist (07/16/2018). Still has pain but feels slight improvement (09/20/2018); increased B knee pain with stairs (12/25/2018), (01/22/2019)    Time  6    Period  Weeks    Status  On-going      PT LONG TERM GOAL #3   Title  Patient will improve B hip extension and abduction strengh by at least 1/2 MMT to promote ability to negotiate stairs and climb hills with less pain.     Time  6    Period  Weeks    Status  Achieved    Target Date  02/07/19      PT LONG TERM GOAL #4   Title  Patient will improve her 6 minute walk distance by at least 100 ft to promote mobility.     Baseline  1045 ft with L lateral hip pain/discomfort (07/16/2018); 1082 ft (09/20/2018); 910 ft (12/25/2018); 1008 ft, no pain or discomfort (01/22/2019); 1063 ft (02/07/2019)    Time  6    Period  Weeks    Status  On-going    Target Date  03/21/19      PT LONG TERM GOAL #5   Title  Patient will have a decrease in R and L foot pain to 2/10 or less at worst to promote ability to ambulate with less difficulty.     Baseline  5/10 R foot pain, 3/10 L foot pain at most for the past 2 weeks (12/25/2018); 3.5/10 R foot and L foot pain at most for the past 7 days (01/22/2019); 2.5/10 B foot pain at most for the past 7 days (02/07/2019)    Time  6    Period  Weeks    Status  Partially Met    Target Date  03/21/19            Plan - 03/07/19 1610    Clinical Impression Statement  Continued working on decreasing lateral thigh tissue tension and improving medial quad strength as well as glute and medial hamstrings strengthening to promote better mechanics at her knees. No pain with stair negotiation today. Pt will benefit from continued skilled physical therapy services to decrease pain, improve strength and function.    Rehab Potential  Fair    PT Frequency  2x / week    PT Duration  6 weeks    PT Treatment/Interventions  Aquatic  Therapy;Electrical Stimulation;Iontophoresis 1m/ml Dexamethasone;Gait training;Stair training;Therapeutic activities;Therapeutic exercise;Balance training;Neuromuscular re-education;Patient/family education;Manual techniques;Dry needling    PT Next Visit Plan  hip strengthening, femoral control, manual techniques, modalities PRN    PT Home Exercise Plan  no updates this session    Consulted and Agree with Plan of Care  Patient       Patient will benefit from skilled therapeutic intervention in order to improve the following deficits and impairments:  Pain, Postural dysfunction, Improper body mechanics, Decreased strength, Decreased range of motion  Visit Diagnosis: 1. Pain in right foot   2. Muscle weakness (generalized)   3. Difficulty in walking, not elsewhere classified   4. Chronic pain of right knee   5. Chronic pain of left knee   6. Pain in left foot        Problem List Patient Active Problem List   Diagnosis Date Noted  . Arthritis of knee, degenerative 05/22/2018  . Cataract, right eye 05/17/2018  . Aortic stenosis, mild 02/22/2018  . Obesity (BMI 30.0-34.9) 01/18/2018  . Hip pain, chronic 04/02/2015  . Hearing loss of both ears  04/02/2015  . Osteopenia of the elderly 04/02/2015  . Hormone replacement therapy (postmenopausal) 04/02/2015  . Aortic aneurysm without rupture ([place]) 04/02/2015  . Situational anxiety 04/02/2015  . Hyperlipidemia LDL goal <70 11/18/2014  . Mixed stress and urge urinary incontinence 06/29/2014  . Hypertension goal BP (blood pressure) < 140/90 11/06/2013  . CAD (coronary artery disease) 11/06/2013    Joneen Boers PT, DPT   03/07/2019, 6:37 PM  [facility] [address], [place], [address] Phone: [phone]   Fax:  [phone]  Name: Angel [name] MRN: [id number] Date of Birth: April 05, 1931

## 2019-03-12 ENCOUNTER — Ambulatory Visit: Payer: Medicare Other | Attending: Family Medicine

## 2019-03-12 ENCOUNTER — Other Ambulatory Visit: Payer: Self-pay

## 2019-03-12 DIAGNOSIS — M6281 Muscle weakness (generalized): Secondary | ICD-10-CM | POA: Diagnosis not present

## 2019-03-12 DIAGNOSIS — M25562 Pain in left knee: Secondary | ICD-10-CM | POA: Diagnosis not present

## 2019-03-12 DIAGNOSIS — M79671 Pain in right foot: Secondary | ICD-10-CM | POA: Insufficient documentation

## 2019-03-12 DIAGNOSIS — M25561 Pain in right knee: Secondary | ICD-10-CM | POA: Insufficient documentation

## 2019-03-12 DIAGNOSIS — G8929 Other chronic pain: Secondary | ICD-10-CM | POA: Insufficient documentation

## 2019-03-12 DIAGNOSIS — R262 Difficulty in walking, not elsewhere classified: Secondary | ICD-10-CM | POA: Insufficient documentation

## 2019-03-12 DIAGNOSIS — M79672 Pain in left foot: Secondary | ICD-10-CM | POA: Diagnosis not present

## 2019-03-12 NOTE — Patient Instructions (Signed)
Seated bilateral shoulder extension isometrics  Sitting on a chair    Press your hands on your thighs to feel your abdominal muscles contract.   Hold for 5 seconds comfortably.   Repeat 10 times.   Perform 3 sets daily.

## 2019-03-12 NOTE — Therapy (Signed)
[facility] [address], [place], [address] Phone: [phone]   Fax:  [phone]  Physical Therapy Treatment And Progress Report (01/24/2019 - 03/12/2019)  Patient Details  Name: Angel [name] MRN: [id number] Date of Birth: [birth year] Referring Provider (PT): Enid Derry, MD   Encounter Date: 03/12/2019  PT End of Session - 03/12/19 1525    Visit Number  20    Number of Visits  75    Date for PT Re-Evaluation  03/21/19    Authorization Type  10    Authorization Time Period  10    PT Start Time  1525    PT Stop Time  1617    PT Time Calculation (min)  52 min    Activity Tolerance  Patient tolerated treatment well    Behavior During Therapy  [facility] for tasks assessed/performed       Past Medical History:  Diagnosis Date  . Anxiety   . Aortic aneurysm without rupture ([place])   . CAD (coronary artery disease)   . Chronic hip pain   . Endometriosis   . Hearing loss   . HTN, goal below 150/90   . Hyperlipidemia LDL goal <70 11/18/2014  . Osteopenia of the elderly   . Urge and stress incontinence     Past Surgical History:  Procedure Laterality Date  . ABDOMINAL HYSTERECTOMY  1976   total  . APPENDECTOMY    . BREAST EXCISIONAL BIOPSY Left 1987   benign    There were no vitals filed for this visit.  Subjective Assessment - 03/12/19 1527    Subjective  The knees are ok. Has a pain in her R foot today. No B knee pain currently, 2.5/10 for the past 7 days (including stairs). 4/10 R foot pain currently (2.5/10 B foot pain at worst for the past 7 days; today was just an execption).    Pertinent History  B knee pain since a year or more. Walking up the steps bothers her. Walking around regularly is no pain. Pt lives in a 2 story home, first floor set up. 14 steps to get to second floor, L banister rail starting a third of the way up.  No steps to enter front and back door.  Gradual onset of B knee pain.  Has been to Dr. Marry Guan who did x-rays who said that there is still cartilage in both knees. Was diagnosed with osteoarthritis.  [place]. Walking up hills bothers her knees.  Pt also walks about 20 minutes a day walking her dog.  R knee feels fragile when she starts with it to step up a step therefore she starts with her L, then she can step up with her R LE. Also goes to a chiropracter for adjustments.  Pt also has 5 step to go down to her meditation room, R rail assist in her home.  Has not yet used ice for her knees.   Pt adds that she has B foot pronation and plantar arch pain in which her orthotics that she wears helps her a lot.     Currently in Pain?  Yes    Pain Score  4    R foot arch                              PT Education -  03/12/19 1547    Education Details  ther-ex, HEP    Person(s) Educated  Patient    Methods  Explanation;Demonstration;Tactile cues;Verbal cues;Handout    Comprehension  Returned demonstration;Verbalized understanding        Objective  MedbridgeAccess Code: [email or web address]   No latex band allergies Blood pressure is controlled per pt.  Aortic aneurism osteopenia  Pt demonstrates B foot pronation. Pt states wearing orthotics  Pt states that she got a letter from Dr. Sanda Klein stating that she is leaving the practice due to health issues. Pt was recommended to see their nurse practitioner [name]   Pt states that getting up from the couch is hard for her.     Therapeutic exercise  Standing gastroc stretch with B UE assist 30 seconds x 3  Decreased R sciatic pain at leg with trunk flexion   Standing L trunk side bend 10x 5 seconds   Decreased R foot pain  Standing B scapular retraction red band 10x3 with 5 second holds to promote thoracic extension and decrease low back extension   No R foot pain afterwards  Reviewed plan of care:  perform her HEP for 4 weeks after 03/21/19 session and call back to to let us know how she is doing and if she needs more in person PT. Pt verbalized understanding.   Gait x 6 minutes  985 ft   seated hip adduction ball squeeze 10x10seconds x 2  Ascending and descending 4 regular steps with B UE assist, emphasis on femoral control 2xafter seated hip adduction exercise             No pain. Felt ok per pt.   Seated manually resisted hip extension to promote glute max strength             R 10x3             L 10x3  Seated B shoulder extension isometrics, hands on thighs to promote trunk strengthening and decrease low back extension pressure 10x5 seconds for 2 sets    Improved exercise technique, movement at target joints, use of target muscles after min to mod verbal, visual, tactile cues.     Response to treatment Good muscle use felt with exercises.Pt tolerated session well without aggravation of symptoms.No pain with stair negotiation today.   Clinical impression Patient making very good progress with decreasing B knee pain with stair negotiation. Pt currently demonstrates 2.5/10 knee pain at most for the past 7 days and the home exercises help with stair negotiation based on pt reports. Decreased R plantar foot pain with treatment to decrease low back extension pressure. No reports of foot or knee pain after session. Pt will benefit from continued skilled physical therapy services to decrease pain, improve strength, function and ability to negotiate stairs.          PT Short Term Goals - 09/20/18 1905      PT SHORT TERM GOAL #1   Title  Patient will be independent with her HEP to decrease pain, improve strength and function.     Time  3    Period  Weeks    Status  On-going    Target Date  10/11/18        PT Long Term Goals - 03/12/19 1548      PT LONG TERM GOAL #1   Title  Patient will have a decrease in B knee pain to 2/10 or less at worst to  decrease difficulty negotiating stairs or  climbing up hills.     Baseline  R knee 5/10 , L knee 4.5/10 at worst for the past 3 months (07/16/2018); 6/10 R knee pain at most, 4.5/10 L knee pain at most for the past 7 days (09/20/2018); 3/10 B knee pain at worst for the past 7 days (09/27/2018); 3/10 B knee pain at worst for the past 2 weeks (12/25/2018). 1.5/10 R knee pain (except with stairs), 1.5/10 L knee pain at most for the past 7 days except with steps/stairs (01/22/2019); 3.5/10 for both knees at most for the past 7 days going up and down steps. 2/10 at most B knees without the stairs (02/07/2019); 2.5/10 B knee pain at most for the past 7 days including stairs (03/12/2019)    Time  6    Period  Weeks    Status  Partially Met    Target Date  03/21/19      PT LONG TERM GOAL #2   Title  Patient will be able to ascend and descend 4 regular steps with bilateral UE assist at least 2 x with minimal to no complain of B knee pain.     Baseline  Increased B knee pain with ascending and descending 4 regular steps with B UE assist (07/16/2018). Still has pain but feels slight improvement (09/20/2018); increased B knee pain with stairs (12/25/2018), (01/22/2019); Minimal to no complain of B knee pain (03/12/2019)    Time  6    Period  Weeks    Status  Achieved    Target Date  03/21/19      PT LONG TERM GOAL #3   Title  Patient will improve B hip extension and abduction strengh by at least 1/2 MMT to promote ability to negotiate stairs and climb hills with less pain.     Time  6    Period  Weeks    Status  Achieved      PT LONG TERM GOAL #4   Title  Patient will improve her 6 minute walk distance by at least 100 ft to promote mobility.     Baseline  1045 ft with L lateral hip pain/discomfort (07/16/2018); 1082 ft (09/20/2018); 910 ft (12/25/2018); 1008 ft, no pain or discomfort (01/22/2019); 1063 ft (02/07/2019); 985 ft  (03/12/19)    Time  6    Period  Weeks    Status  On-going    Target Date  03/21/19      PT LONG  TERM GOAL #5   Title  Patient will have a decrease in R and L foot pain to 2/10 or less at worst to promote ability to ambulate with less difficulty.     Baseline  5/10 R foot pain, 3/10 L foot pain at most for the past 2 weeks (12/25/2018); 3.5/10 R foot and L foot pain at most for the past 7 days (01/22/2019); 2.5/10 B foot pain at most for the past 7 days (02/07/2019); 4/10 at most R foot for the past 7 days (03/12/2019)    Time  6    Period  Weeks    Status  Partially Met    Target Date  03/21/19            Plan - 03/12/19 1548    Clinical Impression Statement  Patient making very good progress with decreasing B knee pain with stair negotiation. Pt currently demonstrates 2.5/10 knee pain at most for the past 7 days and the home exercises help with stair negotiation based on pt  reports. Decreased R plantar foot pain with treatment to decrease low back extension pressure. No reports of foot or knee pain after session. Pt will benefit from continued skilled physical therapy services to decrease pain, improve strength, function and ability to negotiate stairs.    Personal Factors and Comorbidities  Age;Comorbidity 2;Fitness;Time since onset of injury/illness/exacerbation    Comorbidities  Osteopenia, aortic anneurism    Examination-Activity Limitations  Stairs    Stability/Clinical Decision Making  Stable/Uncomplicated    Clinical Decision Making  Low    Clinical Presentation due to:  decreased B knee pain and improved ability to negotiate stair more comfortably for her knees.    Rehab Potential  Fair    PT Frequency  2x / week    PT Duration  6 weeks    PT Treatment/Interventions  Aquatic Therapy;Electrical Stimulation;Iontophoresis 71m/ml Dexamethasone;Gait training;Stair training;Therapeutic activities;Therapeutic exercise;Balance training;Neuromuscular re-education;Patient/family education;Manual techniques;Dry needling    PT Next Visit Plan  hip strengthening, femoral control, manual  techniques, modalities PRN    PT Home Exercise Plan  Medbridge Access code [phone]   Consulted and Agree with Plan of Care  Patient       Patient will benefit from skilled therapeutic intervention in order to improve the following deficits and impairments:  Pain, Postural dysfunction, Improper body mechanics, Decreased strength, Decreased range of motion  Visit Diagnosis: 1. Difficulty in walking, not elsewhere classified   2. Chronic pain of right knee   3. Chronic pain of left knee   4. Pain in right foot   5. Muscle weakness (generalized)   6. Pain in left foot        Problem List Patient Active Problem List   Diagnosis Date Noted  . Arthritis of knee, degenerative 05/22/2018  . Cataract, right eye 05/17/2018  . Aortic stenosis, mild 02/22/2018  . Obesity (BMI 30.0-34.9) 01/18/2018  . Hip pain, chronic 04/02/2015  . Hearing loss of both ears 04/02/2015  . Osteopenia of the elderly 04/02/2015  . Hormone replacement therapy (postmenopausal) 04/02/2015  . [place] 04/02[place]  . Situational anxiety 04/02/2015  . Hyperlipidemia LDL goal <70 11/18/2014  . Mixed stress and urge urinary incontinence 06/29/2014  . Hypertension goal BP (blood pressure) < 140/90 11/06/2013  . CAD (coronary artery disease) 11/06/2013    Thank you for your referral.   MJoneen BoersPT, DPT   03/12/2019, 4:46 PM  [facility] [address] [place] [address]Phone: [phone]  Fax:  [phone] Name: Angel MIKELSMRN: [id number] of Birth: 4June 11, 1932

## 2019-03-14 ENCOUNTER — Ambulatory Visit: Payer: Medicare Other

## 2019-03-14 ENCOUNTER — Other Ambulatory Visit: Payer: Self-pay

## 2019-03-14 DIAGNOSIS — G8929 Other chronic pain: Secondary | ICD-10-CM

## 2019-03-14 DIAGNOSIS — R262 Difficulty in walking, not elsewhere classified: Secondary | ICD-10-CM | POA: Diagnosis not present

## 2019-03-14 DIAGNOSIS — M6281 Muscle weakness (generalized): Secondary | ICD-10-CM

## 2019-03-14 DIAGNOSIS — M79672 Pain in left foot: Secondary | ICD-10-CM

## 2019-03-14 DIAGNOSIS — M25561 Pain in right knee: Secondary | ICD-10-CM | POA: Diagnosis not present

## 2019-03-14 DIAGNOSIS — M25562 Pain in left knee: Secondary | ICD-10-CM | POA: Diagnosis not present

## 2019-03-14 DIAGNOSIS — M79671 Pain in right foot: Secondary | ICD-10-CM

## 2019-03-14 NOTE — Patient Instructions (Signed)
MedbridgeAccess Code: [email or web address]  Standing resisted hip abduction and extension red band around thighs with B UE assist 10x3 each side

## 2019-03-14 NOTE — Therapy (Signed)
[facility] [address] [place], [address] Phone: [phone]   Fax:  [phone]  Physical Therapy Treatment  Patient Details  Name: Angel [name] MRN: [id number] Date of Birth: [birth year] Referring Provider (PT): Enid Derry, MD   Encounter Date: 03/14/2019  PT End of Session - 03/14/19 1524    Visit Number  21    Number of Visits  49    Date for PT Re-Evaluation  03/21/19    Authorization Type  1    Authorization Time Period  10    PT Start Time  1524   pt arrived late   PT Stop Time  1601    PT Time Calculation (min)  37 min    Activity Tolerance  Patient tolerated treatment well    Behavior During Therapy  [facility] for tasks assessed/performed       Past Medical History:  Diagnosis Date  . Anxiety   . Aortic aneurysm without rupture ([place])   . CAD (coronary artery disease)   . Chronic hip pain   . Endometriosis   . Hearing loss   . HTN, goal below 150/90   . Hyperlipidemia LDL goal <70 11/18/2014  . Osteopenia of the elderly   . Urge and stress incontinence     Past Surgical History:  Procedure Laterality Date  . ABDOMINAL HYSTERECTOMY  1976   total  . APPENDECTOMY    . BREAST EXCISIONAL BIOPSY Left 1987   benign    There were no vitals filed for this visit.  Subjective Assessment - 03/14/19 1526    Subjective  Made an appointment with a new Doctor to replace her previous PCP in September 2020.  A little tight in the R foot. 2-3/10 B knee pain currently.    Pertinent History  B knee pain since a year or more. Walking up the steps bothers her. Walking around regularly is no pain. Pt lives in a 2 story home, first floor set up. 14 steps to get to second floor, L banister rail starting a third of the way up.  No steps to enter front and back door.  Gradual onset of B knee pain. Has been to Dr. Marry Guan who did x-rays who said that there is still cartilage in both knees. Was diagnosed with  osteoarthritis.  [place]. Walking up hills bothers her knees.  Pt also walks about 20 minutes a day walking her dog.  R knee feels fragile when she starts with it to step up a step therefore she starts with her L, then she can step up with her R LE. Also goes to a chiropracter for adjustments.  Pt also has 5 step to go down to her meditation room, R rail assist in her home.  Has not yet used ice for her knees.   Pt adds that she has B foot pronation and plantar arch pain in which her orthotics that she wears helps her a lot.     Currently in Pain?  Yes    Pain Score  3    2-3/10 B knee pain. R foot plantar tightess.                              PT Education - 03/14/19 1528    Education Details  ther-ex    [employer]) Educated  Patient    Methods  Explanation;Demonstration;Tactile cues;Verbal cues    Comprehension  Returned demonstration;Verbalized understanding        Objective  MedbridgeAccess Code: [email or web address]   No latex band allergies Blood pressure is controlled per pt.  Aortic aneurism osteopenia  Pt demonstrates B foot pronation. Pt states wearing orthotics  Pt states that she got a letter from Dr. Sanda Klein stating that she is leaving the practice due to health issues. Pt was recommended to see their nurse practitioner [name]   Pt states that getting up from the couch is hard for her.     Therapeutic exercise  Standing B scapular retraction 10x5 seconds for 2 sets  Decreased R plantar foot tightness  Then with yellow band 10x2   No R plantar foot pain with gait afterwards  Standing with B UE assist   Hip abduction red band around thighs   R 10x3   L 10x3   Hip extension red band around thighs   R 10x3   L 10x3  Stand to sit onto low mat table without UE assist 3x  Then with squeezing a ball between knees 2x. Still demonstrates B knee  pain   seated hip adduction ball squeeze 10x10seconds  Reviewed new home exercises.    Improved exercise technique, movement at target joints, use of target muscles after mod verbal, visual, tactile cues.      Manual therapy  Seated STM B lateral hamstrings, vastus lateralis, and IT band     Response to treatment Good muscle use felt with exercises.Pt tolerated session well without aggravation of symptoms.No pain with stair negotiation today.   Clinical impression R plantar foot pain alleviated with treatment to promote thoracic extension suggesting low back involvement with foot pain. Continued working on glute med and max strengthening to promote proper mechanics on her knees. B patellar discomfort with sit <> stand from low mat table. Performed soft tissue mobilization to help decrease tension to lateral thigh tissues. Pt will benefit from continued skilled physical therapy services to decrease pain, improve strength and function.     PT Short Term Goals - 09/20/18 1905      PT SHORT TERM GOAL #1   Title  Patient will be independent with her HEP to decrease pain, improve strength and function.     Time  3    Period  Weeks    Status  On-going    Target Date  10/11/18        PT Long Term Goals - 03/12/19 1548      PT LONG TERM GOAL #1   Title  Patient will have a decrease in B knee pain to 2/10 or less at worst to decrease difficulty negotiating stairs or climbing up hills.     Baseline  R knee 5/10 , L knee 4.5/10 at worst for the past 3 months (07/16/2018); 6/10 R knee pain at most, 4.5/10 L knee pain at most for the past 7 days (09/20/2018); 3/10 B knee pain at worst for the past 7 days (09/27/2018); 3/10 B knee pain at worst for the past 2 weeks (12/25/2018). 1.5/10 R knee pain (except with stairs), 1.5/10 L knee pain at most for the past 7 days except with steps/stairs (01/22/2019); 3.5/10 for both knees at most for the past 7 days going up and down steps. 2/10 at  most B knees without the stairs (02/07/2019); 2.5/10 B knee pain at most for the past 7 days including stairs (03/12/2019)  Time  6    Period  Weeks    Status  Partially Met    Target Date  03/21/19      PT LONG TERM GOAL #2   Title  Patient will be able to ascend and descend 4 regular steps with bilateral UE assist at least 2 x with minimal to no complain of B knee pain.     Baseline  Increased B knee pain with ascending and descending 4 regular steps with B UE assist (07/16/2018). Still has pain but feels slight improvement (09/20/2018); increased B knee pain with stairs (12/25/2018), (01/22/2019); Minimal to no complain of B knee pain (03/12/2019)    Time  6    Period  Weeks    Status  Achieved    Target Date  03/21/19      PT LONG TERM GOAL #3   Title  Patient will improve B hip extension and abduction strengh by at least 1/2 MMT to promote ability to negotiate stairs and climb hills with less pain.     Time  6    Period  Weeks    Status  Achieved      PT LONG TERM GOAL #4   Title  Patient will improve her 6 minute walk distance by at least 100 ft to promote mobility.     Baseline  1045 ft with L lateral hip pain/discomfort (07/16/2018); 1082 ft (09/20/2018); 910 ft (12/25/2018); 1008 ft, no pain or discomfort (01/22/2019); 1063 ft (02/07/2019); 985 ft  (03/12/19)    Time  6    Period  Weeks    Status  On-going    Target Date  03/21/19      PT LONG TERM GOAL #5   Title  Patient will have a decrease in R and L foot pain to 2/10 or less at worst to promote ability to ambulate with less difficulty.     Baseline  5/10 R foot pain, 3/10 L foot pain at most for the past 2 weeks (12/25/2018); 3.5/10 R foot and L foot pain at most for the past 7 days (01/22/2019); 2.5/10 B foot pain at most for the past 7 days (02/07/2019); 4/10 at most R foot for the past 7 days (03/12/2019)    Time  6    Period  Weeks    Status  Partially Met    Target Date  03/21/19            Plan - 03/14/19 1543    Clinical  Impression Statement  R plantar foot pain alleviated with treatment to promote thoracic extension suggesting low back involvement with foot pain. Continued working on glute med and max strengthening to promote proper mechanics on her knees. B patellar discomfort with sit <> stand from low mat table. Performed soft tissue mobilization to help decrease tension to lateral thigh tissues. Pt will benefit from continued skilled physical therapy services to decrease pain, improve strength and function.    Personal Factors and Comorbidities  Age;Comorbidity 2;Fitness;Time since onset of injury/illness/exacerbation    Comorbidities  Osteopenia, aortic anneurism    Examination-Activity Limitations  Stairs    Stability/Clinical Decision Making  Stable/Uncomplicated    Rehab Potential  Fair    PT Frequency  2x / week    PT Duration  6 weeks    PT Treatment/Interventions  Aquatic Therapy;Electrical Stimulation;Iontophoresis '4mg'$ /ml Dexamethasone;Gait training;Stair training;Therapeutic activities;Therapeutic exercise;Balance training;Neuromuscular re-education;Patient/family education;Manual techniques;Dry needling    PT Next Visit Plan  hip strengthening, femoral control, manual techniques,  modalities PRN    PT Home Exercise Plan  Medbridge Access code [phone]    Consulted and Agree with Plan of Care  Patient       Patient will benefit from skilled therapeutic intervention in order to improve the following deficits and impairments:  Pain, Postural dysfunction, Improper body mechanics, Decreased strength, Decreased range of motion  Visit Diagnosis: 1. Difficulty in walking, not elsewhere classified   2. Chronic pain of right knee   3. Chronic pain of left knee   4. Pain in right foot   5. Muscle weakness (generalized)   6. Pain in left foot        Problem List Patient Active Problem List   Diagnosis Date Noted  . Arthritis of knee, degenerative 05/22/2018  . Cataract, right eye 05/17/2018  .  Aortic stenosis, mild 02/22/2018  . Obesity (BMI 30.0-34.9) 01/18/2018  . Hip pain, chronic 04/02/2015  . Hearing loss of both ears 04/02/2015  . Osteopenia of the elderly 04/02/2015  . Hormone replacement therapy (postmenopausal) 04/02/2015  . Aortic aneurysm without rupture ([place]) 04/02/2015  . Situational anxiety 04/02/2015  . Hyperlipidemia LDL goal <70 11/18/2014  . Mixed stress and urge urinary incontinence 06/29/2014  . Hypertension goal BP (blood pressure) < 140/90 11/06/2013  . CAD (coronary artery disease) 11/06/2013    Joneen Boers PT, DPT   03/14/2019, 4:20 PM  [facility] [address], [place], [address] Phone: [phone]   Fax:  [phone]  Name: CARESSA SCEARCE MRN: [id number] Date of Birth: 1931-03-22

## 2019-03-15 ENCOUNTER — Other Ambulatory Visit: Payer: Self-pay | Admitting: Interventional Cardiology

## 2019-03-19 ENCOUNTER — Ambulatory Visit: Payer: Medicare Other

## 2019-03-19 ENCOUNTER — Other Ambulatory Visit: Payer: Self-pay

## 2019-03-19 DIAGNOSIS — R262 Difficulty in walking, not elsewhere classified: Secondary | ICD-10-CM | POA: Diagnosis not present

## 2019-03-19 DIAGNOSIS — G8929 Other chronic pain: Secondary | ICD-10-CM | POA: Diagnosis not present

## 2019-03-19 DIAGNOSIS — M79671 Pain in right foot: Secondary | ICD-10-CM | POA: Diagnosis not present

## 2019-03-19 DIAGNOSIS — M25562 Pain in left knee: Secondary | ICD-10-CM

## 2019-03-19 DIAGNOSIS — M62830 Muscle spasm of back: Secondary | ICD-10-CM | POA: Diagnosis not present

## 2019-03-19 DIAGNOSIS — M9902 Segmental and somatic dysfunction of thoracic region: Secondary | ICD-10-CM | POA: Diagnosis not present

## 2019-03-19 DIAGNOSIS — M5416 Radiculopathy, lumbar region: Secondary | ICD-10-CM | POA: Diagnosis not present

## 2019-03-19 DIAGNOSIS — M25561 Pain in right knee: Secondary | ICD-10-CM | POA: Diagnosis not present

## 2019-03-19 DIAGNOSIS — M5134 Other intervertebral disc degeneration, thoracic region: Secondary | ICD-10-CM | POA: Diagnosis not present

## 2019-03-19 DIAGNOSIS — M6281 Muscle weakness (generalized): Secondary | ICD-10-CM | POA: Diagnosis not present

## 2019-03-19 DIAGNOSIS — M79672 Pain in left foot: Secondary | ICD-10-CM

## 2019-03-19 NOTE — Therapy (Signed)
[facility] [address] [place], [address] Phone: [phone]   Fax:  [phone]  Physical Therapy Treatment  Patient Details  Name: Angel [name] MRN: [id number] Date of Birth: [birth year] Referring Provider (PT): Enid Derry, MD   Encounter Date: 03/19/2019  PT End of Session - 03/19/19 1520    Visit Number  22    Number of Visits  35    Date for PT Re-Evaluation  03/21/19    Authorization Type  2    Authorization Time Period  10    PT Start Time  1520    PT Stop Time  1601    PT Time Calculation (min)  41 min    Activity Tolerance  Patient tolerated treatment well    Behavior During Therapy  [facility] for tasks assessed/performed       Past Medical History:  Diagnosis Date  . Anxiety   . Aortic aneurysm without rupture ([place])   . CAD (coronary artery disease)   . Chronic hip pain   . Endometriosis   . Hearing loss   . HTN, goal below 150/90   . Hyperlipidemia LDL goal <70 11/18/2014  . Osteopenia of the elderly   . Urge and stress incontinence     Past Surgical History:  Procedure Laterality Date  . ABDOMINAL HYSTERECTOMY  1976   total  . APPENDECTOMY    . BREAST EXCISIONAL BIOPSY Left 1987   benign    There were no vitals filed for this visit.  Subjective Assessment - 03/19/19 1521    Subjective  Knees and feet are fine. 1.2/10 pain currently for all of the above. No pain. Just a little awkwardness. Going up and down her steps is better. 2.5/10 B knee pain when going up and down those stepps.    Pertinent History  B knee pain since a year or more. Walking up the steps bothers her. Walking around regularly is no pain. Pt lives in a 2 story home, first floor set up. 14 steps to get to second floor, L banister rail starting a third of the way up.  No steps to enter front and back door.  Gradual onset of B knee pain. Has been to Dr. Marry Guan who did x-rays who said that there is still cartilage  in both knees. Was diagnosed with osteoarthritis.  [place]. Walking up hills bothers her knees.  Pt also walks about 20 minutes a day walking her dog.  R knee feels fragile when she starts with it to step up a step therefore she starts with her L, then she can step up with her R LE. Also goes to a chiropracter for adjustments.  Pt also has 5 step to go down to her meditation room, R rail assist in her home.  Has not yet used ice for her knees.   Pt adds that she has B foot pronation and plantar arch pain in which her orthotics that she wears helps her a lot.     Currently in Pain?  Yes    Pain Score  1                                PT Education - 03/19/19 1522    Education Details  ther-ex    Person(s) Educated  Patient  Methods  Explanation;Tactile cues;Demonstration;Verbal cues    Comprehension  Returned demonstration;Verbalized understanding      Objective  MedbridgeAccess Code: [email or web address]   No latex band allergies Blood pressure is controlled per pt.  Aortic aneurism osteopenia  Pt demonstrates B foot pronation. Pt states wearing orthotics  Pt states that she got a letter from Dr. Sanda Klein stating that she is leaving the practice due to health issues. Pt was recommended to see their nurse practitioner [name]   Pt states that getting up from the couch is hard for her.     Therapeutic exercise  Seated manually resisted hip extension   R 10x3  L 10x3  Seated manually resisted medial hamstrings flexion              R 10x3 with 5 second holds to decrease R tibial ER  SLS with B UE assist, emphasis on level pelvis to promote glute med muscle strengthening   R 10x5 seconds for 2 sets  L 10x5 seconds for 2 sets  Good glute med muscle use felt.   Seated B scapular retraction to help decrease plantar foot tightness 10x5 seconds for 2 sets    Improved  exercise technique, movement at target joints, use of target muscles after min to mod verbal, visual, tactile cues.      Manual therapy  Seated STM B lateral hamstrings, vastus lateralis, and IT band    Response to treatment Good muscle use felt with exercises.No B knee or foot pain after session    Clinical impression Continued working on decreasing bilateral lateral thigh tension, improving B glute med and max strength, improving R medial hamstrings activation to promote proper mechanics to knees when performing standing tasks such as stair negotiation. No B knee or foot pain after session. Pt making progress with decreased knee and foot pain with stair negotiation based on subjective reports. Pt will benefit from continued skilled physical therapy services to decrease pain, improve strength and function.    PT Short Term Goals - 09/20/18 1905      PT SHORT TERM GOAL #1   Title  Patient will be independent with her HEP to decrease pain, improve strength and function.     Time  3    Period  Weeks    Status  On-going    Target Date  10/11/18        PT Long Term Goals - 03/12/19 1548      PT LONG TERM GOAL #1   Title  Patient will have a decrease in B knee pain to 2/10 or less at worst to decrease difficulty negotiating stairs or climbing up hills.     Baseline  R knee 5/10 , L knee 4.5/10 at worst for the past 3 months (07/16/2018); 6/10 R knee pain at most, 4.5/10 L knee pain at most for the past 7 days (09/20/2018); 3/10 B knee pain at worst for the past 7 days (09/27/2018); 3/10 B knee pain at worst for the past 2 weeks (12/25/2018). 1.5/10 R knee pain (except with stairs), 1.5/10 L knee pain at most for the past 7 days except with steps/stairs (01/22/2019); 3.5/10 for both knees at most for the past 7 days going up and down steps. 2/10 at most B knees without the stairs (02/07/2019); 2.5/10 B knee pain at most for the past 7 days including stairs (03/12/2019)    Time  6     Period  Weeks    Status  Partially Met  Target Date  03/21/19      PT LONG TERM GOAL #2   Title  Patient will be able to ascend and descend 4 regular steps with bilateral UE assist at least 2 x with minimal to no complain of B knee pain.     Baseline  Increased B knee pain with ascending and descending 4 regular steps with B UE assist (07/16/2018). Still has pain but feels slight improvement (09/20/2018); increased B knee pain with stairs (12/25/2018), (01/22/2019); Minimal to no complain of B knee pain (03/12/2019)    Time  6    Period  Weeks    Status  Achieved    Target Date  03/21/19      PT LONG TERM GOAL #3   Title  Patient will improve B hip extension and abduction strengh by at least 1/2 MMT to promote ability to negotiate stairs and climb hills with less pain.     Time  6    Period  Weeks    Status  Achieved      PT LONG TERM GOAL #4   Title  Patient will improve her 6 minute walk distance by at least 100 ft to promote mobility.     Baseline  1045 ft with L lateral hip pain/discomfort (07/16/2018); 1082 ft (09/20/2018); 910 ft (12/25/2018); 1008 ft, no pain or discomfort (01/22/2019); 1063 ft (02/07/2019); 985 ft  (03/12/19)    Time  6    Period  Weeks    Status  On-going    Target Date  03/21/19      PT LONG TERM GOAL #5   Title  Patient will have a decrease in R and L foot pain to 2/10 or less at worst to promote ability to ambulate with less difficulty.     Baseline  5/10 R foot pain, 3/10 L foot pain at most for the past 2 weeks (12/25/2018); 3.5/10 R foot and L foot pain at most for the past 7 days (01/22/2019); 2.5/10 B foot pain at most for the past 7 days (02/07/2019); 4/10 at most R foot for the past 7 days (03/12/2019)    Time  6    Period  Weeks    Status  Partially Met    Target Date  03/21/19            Plan - 03/19/19 1519    Clinical Impression Statement  Continued working on decreasing bilateral lateral thigh tension, improving B glute med and max strength, improving  R medial hamstrings activation to promote proper mechanics to knees when performing standing tasks such as stair negotiation. No B knee or foot pain after session. Pt making progress with decreased knee and foot pain with stair negotiation based on subjective reports. Pt will benefit from continued skilled physical therapy services to decrease pain, improve strength and function.    Personal Factors and Comorbidities  Age;Comorbidity 2;Fitness;Time since onset of injury/illness/exacerbation    Comorbidities  Osteopenia, aortic anneurism    Examination-Activity Limitations  Stairs    Stability/Clinical Decision Making  Stable/Uncomplicated    Rehab Potential  Fair    PT Frequency  2x / week    PT Duration  6 weeks    PT Treatment/Interventions  Aquatic Therapy;Electrical Stimulation;Iontophoresis 28m/ml Dexamethasone;Gait training;Stair training;Therapeutic activities;Therapeutic exercise;Balance training;Neuromuscular re-education;Patient/family education;Manual techniques;Dry needling    PT Next Visit Plan  hip strengthening, femoral control, manual techniques, modalities PRN    PT HLennoxAccess code [phone]  Consulted and Agree with Plan of Care  Patient       Patient will benefit from skilled therapeutic intervention in order to improve the following deficits and impairments:  Pain, Postural dysfunction, Improper body mechanics, Decreased strength, Decreased range of motion  Visit Diagnosis: 1. Difficulty in walking, not elsewhere classified   2. Chronic pain of right knee   3. Chronic pain of left knee   4. Pain in right foot   5. Muscle weakness (generalized)   6. Pain in left foot        Problem List Patient Active Problem List   Diagnosis Date Noted  . Arthritis of knee, degenerative 05/22/2018  . Cataract, right eye 05/17/2018  . Aortic stenosis, mild 02/22/2018  . Obesity (BMI 30.0-34.9) 01/18/2018  . Hip pain, chronic 04/02/2015  . Hearing loss  of both ears 04/02/2015  . Osteopenia of the elderly 04/02/2015  . Hormone replacement therapy (postmenopausal) 04/02/2015  . Aortic aneurysm without rupture ([place]) 04/02/2015  . Situational anxiety 04/02/2015  . Hyperlipidemia LDL goal <70 11/18/2014  . Mixed stress and urge urinary incontinence 06/29/2014  . Hypertension goal BP (blood pressure) < 140/90 11/06/2013  . CAD (coronary artery disease) 11/06/2013    Joneen Boers PT, DPT   03/19/2019, 4:19 PM   [facility] [address], [place], [address] Phone: [phone]   Fax:  [phone]  Name: Angel [name] MRN: [id number] Date of Birth: 1931-01-30

## 2019-03-21 ENCOUNTER — Ambulatory Visit: Payer: Medicare Other

## 2019-04-02 ENCOUNTER — Other Ambulatory Visit: Payer: Self-pay | Admitting: Interventional Cardiology

## 2019-04-03 ENCOUNTER — Telehealth: Payer: Self-pay

## 2019-04-03 ENCOUNTER — Other Ambulatory Visit: Payer: Self-pay | Admitting: Interventional Cardiology

## 2019-04-03 ENCOUNTER — Other Ambulatory Visit: Payer: Self-pay | Admitting: Family Medicine

## 2019-04-03 MED ORDER — LISINOPRIL 40 MG PO TABS: 40.0000 mg | ORAL_TABLET | Freq: Every day | ORAL | 0 refills | Status: DC

## 2019-04-03 MED ORDER — LISINOPRIL 40 MG PO TABS: 40.0000 mg | ORAL_TABLET | Freq: Every day | ORAL | 3 refills | Status: DC

## 2019-04-03 NOTE — Telephone Encounter (Signed)
**Note De-Identified  Obfuscation** Cardiology has Lisinopril 40 qd but received a refill request for BID.  Per [facility] the Lisinopril 40 mg take 1 tablet daily was sent in today by Suezanne Cheshire, DNP to refill.  I have notified [facility] that I sent the Lisinopril 40 mg BID refill in in error as at the pts last OV with Dr Tamala Julian on 02/13/19  the pt was taking Lisinopril 40 mg qd at that time (which is the same dose Suezanne Cheshire, DNP refilled  today). I have removed Lisinopril 40 mg BID from her med list.

## 2019-04-03 NOTE — Telephone Encounter (Signed)
Pt's medication was sent to pt's pharmacy as requested. Confirmation received.  °

## 2019-04-03 NOTE — Telephone Encounter (Signed)
Pt has come in the office and said that she is out of her Lisinopril 40mg  and that the phar sent a request yesterday for her. Could this please be refilled and she has a follow up with Leisa on sept 4, 2020. Pharm is [facility] [place]. She is out

## 2019-04-03 NOTE — Telephone Encounter (Signed)
Previously filled by her cardiologist at [place]  Refill request was sent to Suezanne Cheshire, DNP for approval and submission.

## 2019-04-09 DIAGNOSIS — M62830 Muscle spasm of back: Secondary | ICD-10-CM | POA: Diagnosis not present

## 2019-04-09 DIAGNOSIS — M9902 Segmental and somatic dysfunction of thoracic region: Secondary | ICD-10-CM | POA: Diagnosis not present

## 2019-04-09 DIAGNOSIS — M5416 Radiculopathy, lumbar region: Secondary | ICD-10-CM | POA: Diagnosis not present

## 2019-04-09 DIAGNOSIS — M5134 Other intervertebral disc degeneration, thoracic region: Secondary | ICD-10-CM | POA: Diagnosis not present

## 2019-04-12 ENCOUNTER — Ambulatory Visit: Payer: Medicare Other | Admitting: Family Medicine

## 2019-04-18 ENCOUNTER — Ambulatory Visit (INDEPENDENT_AMBULATORY_CARE_PROVIDER_SITE_OTHER): Payer: Medicare Other | Admitting: Family Medicine

## 2019-04-18 ENCOUNTER — Other Ambulatory Visit: Payer: Self-pay

## 2019-04-18 ENCOUNTER — Encounter: Payer: Self-pay | Admitting: Family Medicine

## 2019-04-18 VITALS — BP 126/80 | HR 92 | Temp 97.5°F | Resp 14 | Ht 63.0 in | Wt 191.4 lb

## 2019-04-18 DIAGNOSIS — I10 Essential (primary) hypertension: Secondary | ICD-10-CM | POA: Diagnosis not present

## 2019-04-18 DIAGNOSIS — M79671 Pain in right foot: Secondary | ICD-10-CM

## 2019-04-18 DIAGNOSIS — M79672 Pain in left foot: Secondary | ICD-10-CM | POA: Diagnosis not present

## 2019-04-18 DIAGNOSIS — M170 Bilateral primary osteoarthritis of knee: Secondary | ICD-10-CM

## 2019-04-18 DIAGNOSIS — Z79890 Hormone replacement therapy (postmenopausal): Secondary | ICD-10-CM

## 2019-04-18 DIAGNOSIS — N179 Acute kidney failure, unspecified: Secondary | ICD-10-CM | POA: Diagnosis not present

## 2019-04-18 DIAGNOSIS — Z23 Encounter for immunization: Secondary | ICD-10-CM | POA: Diagnosis not present

## 2019-04-18 DIAGNOSIS — N183 Chronic kidney disease, stage 3 unspecified: Secondary | ICD-10-CM

## 2019-04-18 DIAGNOSIS — R739 Hyperglycemia, unspecified: Secondary | ICD-10-CM

## 2019-04-18 MED ORDER — ESTROGENS CONJUGATED 0.3 MG PO TABS: ORAL_TABLET | ORAL | 1 refills | Status: DC

## 2019-04-18 NOTE — Patient Instructions (Signed)
We will check your cholesterol at your next follow up office visit, and hopefully get all labs and visits coordinated.  Right now I am rechecking your kidney function and blood sugar  We will call you with results in the next week.

## 2019-04-18 NOTE — Progress Notes (Signed)
Name: Angel [name]   MRN: [id number]    DOB: 1930/12/03   Date:04/18/2019       Progress Note  Chief Complaint  Patient presents with  . Follow-up  . Hyperlipidemia  . Hypertension     Subjective:   Angel [name] is a 85 y.o. female, presents to clinic for routine follow up on the conditions listed above.  Hypertension Patient is on amlodipine 5mg , lisinopril 40mg , and metoprolol 50mg  daily  Takes medications as prescribed with no missed doses a month.  She is not very compliant with low-salt diet.  She has CAD and aortic aneuyrsym- followed by cardiologist Dr. Tamala Julian.  Denies chest pain, headaches, blurry vision. BP Readings from Last 3 Encounters:  04/18/19 126/80  02/13/19 132/72  06/07/18 117/78   Hyperlipidemia  Patient rx atorvastatin 40mg  daily. Takes medications as prescribed with no missed doses a month.  Diet: has been working recently to cut down on carbs,  Breakfast: Cereal, nuts, fruits, coffee Lunch: Salad Dinner:biggest meal, typically protein and was carb heavy in the past but has been working on improving this Denies myalgias   Osteopenia Takes vitamin D and calcium supplementation Weight bearing exercises- working with physical therapy due to osteoarthritis; takes glucosamine-chondroitin supplementation  Postmenopausal She takes premarin- started after hysterectomy, we have been working on titration down dosage. She is taking 0.3mg  twice a week now. She is requesting to stay on this dose. States this helps her with energy. No hot flashes or vaginal dryness.    Patient Active Problem List   Diagnosis Date Noted  . Arthritis of knee, degenerative 05/22/2018  . Cataract, right eye 05/17/2018  . Aortic stenosis, mild 02/22/2018  . Obesity (BMI 30.0-34.9) 01/18/2018  . Hip pain, chronic 04/02/2015  . Hearing loss of both ears 04/02/2015  . Osteopenia of the elderly 04/02/2015  . Hormone replacement therapy (postmenopausal) 04/02/2015  .  Aortic aneurysm without rupture ([place]) 04/02/2015  . Situational anxiety 04/02/2015  . Hyperlipidemia LDL goal <70 11/18/2014  . Mixed stress and urge urinary incontinence 06/29/2014  . Hypertension goal BP (blood pressure) < 140/90 11/06/2013  . CAD (coronary artery disease) 11/06/2013    Past Surgical History:  Procedure Laterality Date  . ABDOMINAL HYSTERECTOMY  1976   total  . APPENDECTOMY    . BREAST EXCISIONAL BIOPSY Left 1987   benign    Family History  Problem Relation Age of Onset  . CVA Mother   . Stroke Mother   . Breast cancer Maternal Grandmother 60  . Heart disease Maternal Grandmother   . Cancer Brother        skin    Social History   Socioeconomic History  . Marital status: Widowed    Spouse name: Not on file  . Number of children: Not on file  . Years of education: Not on file  . Highest education level: Not on file  Occupational History  . Not on file  Social Needs  . Financial resource strain: Not on file  . Food insecurity    Worry: Not on file    Inability: Not on file  . Transportation needs    Medical: Not on file    Non-medical: Not on file  Tobacco Use  . Smoking status: Never Smoker  . Smokeless tobacco: Never Used  Substance and Sexual Activity  . Alcohol use: Yes    Alcohol/week: 1.0 standard drinks    Types: 1 Cans of beer per week    Comment: occasional  .  Drug use: No  . Sexual activity: Yes  Lifestyle  . Physical activity    Days per week: Not on file    Minutes per session: Not on file  . Stress: Not on file  Relationships  . Social Herbalist on phone: Not on file    Gets together: Not on file    Attends religious service: Not on file    Active member of club or organization: Not on file    Attends meetings of clubs or organizations: Not on file    Relationship status: Not on file  . Intimate partner violence    Fear of current or ex partner: Not on file    Emotionally abused: Not on file    Physically  abused: Not on file    Forced sexual activity: Not on file  Other Topics Concern  . Not on file  Social History Narrative  . Not on file     Current Outpatient Medications:  .  amLODipine (NORVASC) 5 MG tablet, TAKE 1 TABLET BY MOUTH DAILY, Disp: 90 tablet, Rfl: 3 .  Ascorbic Acid (VITAMIN C) 100 MG tablet, Take 100 mg by mouth daily., Disp: , Rfl:  .  atorvastatin (LIPITOR) 40 MG tablet, TAKE ONE TABLET BY MOUTH EVERY NIGHT AT BEDTIME, Disp: 90 tablet, Rfl: 3 .  clobetasol cream (TEMOVATE) [id number] %, Apply 1 application topically 2 (two) times daily as needed (rashes)., Disp: , Rfl:  .  estrogens, conjugated, (PREMARIN) 0.3 MG tablet, Take one-half of a tablet by mouth twice a week, Disp: 12 tablet, Rfl: 1 .  glucosamine-chondroitin 500-400 MG tablet, Take 1 tablet by mouth daily., Disp: , Rfl:  .  lisinopril (ZESTRIL) 40 MG tablet, Take 1 tablet (40 mg total) by mouth daily., Disp: 90 tablet, Rfl: 3 .  metoprolol succinate (TOPROL-XL) 50 MG 24 hr tablet, TAKE 1 TABLET BY MOUTH DAILY WITH OR IMMEDIATELY FOLLOWING A MEAL, Disp: 90 tablet, Rfl: 3 .  Multiple Vitamin (MULTIVITAMIN) capsule, Take 1 capsule by mouth daily., Disp: , Rfl:  .  vitamin B-12 (CYANOCOBALAMIN) 100 MCG tablet, Take 100 mcg by mouth daily., Disp: , Rfl:  .  VITAMIN D, ERGOCALCIFEROL, PO, Take 1,000 Units by mouth daily., Disp: , Rfl:   Allergies  Allergen Reactions  . Myrbetriq [Mirabegron] Other (See Comments)    Severe depression  . Codeine Other (See Comments)    "zoned out"  . Levofloxacin Other (See Comments)    AMS  . Prednisone Other (See Comments)    AMS  . Sulfa Antibiotics Other (See Comments)    AMS    I personally reviewed active problem list, medication list, allergies, family history, social history, health maintenance, notes from last encounter, lab results with the patient/caregiver today.  Review of Systems   Objective:    Vitals:   04/18/19 1048  Pulse: 92  Resp: 14  Temp: (!) 97.5 F  (36.4 C)  SpO2: 96%  Weight: 191 lb 6.4 oz (86.8 kg)    Body mass index is 32.85 kg/m.  Physical Exam   Recent Results (from the past 2160 hour(s))  Lipid panel     Status: Abnormal   Collection Time: 02/13/19  4:26 PM  Result Value Ref Range   Cholesterol, Total 179 100 - 199 mg/dL   Triglycerides 314 (H) 0 - 149 mg/dL   HDL 42 >39 mg/dL   VLDL Cholesterol Cal 63 (H) 5 - 40 mg/dL   LDL Calculated 74 0 -  99 mg/dL   Chol/HDL Ratio 4.3 0.0 - 4.4 ratio    Comment:                                   T. Chol/HDL Ratio                                             Men  Women                               1/2 Avg.Risk  3.4    3.3                                   Avg.Risk  5.0    4.4                                2X Avg.Risk  9.6    7.1                                3X Avg.Risk 23.4   11.0   Hepatic function panel     Status: None   Collection Time: 02/13/19  4:26 PM  Result Value Ref Range   Total Protein 6.5 6.0 - 8.5 g/dL   Albumin 4.1 3.6 - 4.6 g/dL   Bilirubin Total 0.2 0.0 - 1.2 mg/dL   Bilirubin, Direct 0.09 0.00 - 0.40 mg/dL   Alkaline Phosphatase 51 39 - 117 IU/L   AST 23 0 - 40 IU/L   ALT 21 0 - 32 IU/L  Basic metabolic panel     Status: Abnormal   Collection Time: 02/13/19  4:26 PM  Result Value Ref Range   Glucose 136 (H) 65 - 99 mg/dL   BUN 29 (H) 8 - 27 mg/dL   Creatinine, Ser 1.13 (H) 0.57 - 1.00 mg/dL   GFR calc non Af Amer 44 (L) >59 mL/min/1.73   GFR calc Af Amer 51 (L) >59 mL/min/1.73   BUN/Creatinine Ratio 26 12 - 28   Sodium 141 134 - 144 mmol/L   Potassium 4.1 3.5 - 5.2 mmol/L   Chloride 106 96 - 106 mmol/L   CO2 22 20 - 29 mmol/L   Calcium 9.0 8.7 - 10.3 mg/dL      PHQ2/9: Depression screen [facility] 2/9 02/27/2019 05/17/2018 01/18/2018 06/01/2017 12/02/2016  Decreased Interest 0 0 0 0 0  Down, Depressed, Hopeless 0 0 0 0 0  PHQ - 2 Score 0 0 0 0 0  Altered sleeping 0 - - - -  Tired, decreased energy 0 - - - -  Change in appetite 0 - - - -   Feeling bad or failure about yourself  0 - - - -  Trouble concentrating 0 - - - -  Moving slowly or fidgety/restless 0 - - - -  Suicidal thoughts 0 - - - -  PHQ-9 Score 0 - - - -  Difficult doing work/chores Not difficult at all - - - -    phq 9 is negative Reviewed by me today  Fall Risk:  Fall Risk  02/27/2019 10/16/2018 05/17/2018 01/18/2018 06/01/2017  Falls in the past year? 0 0 No Yes No  Number falls in past yr: 0 0 - 1 -  Injury with Fall? 0 0 - Yes -    Assessment & Plan:     ICD-10-CM   1. Hyperglycemia  123456 BASIC METABOLIC PANEL WITH GFR    Hemoglobin A1c   recheck labs  2. AKI (acute kidney injury) ([place])  123456 BASIC METABOLIC PANEL WITH GFR   recheck labs  3. Stage 3 chronic kidney disease (HCC)  0000000 BASIC METABOLIC PANEL WITH GFR   recheck labs  4. Hypertension goal BP (blood pressure) < 140/90  99991111 BASIC METABOLIC PANEL WITH GFR   well controlled and at goal, no SE, check labs  5. Primary osteoarthritis of both knees  M17.0 Ambulatory referral to Physical Therapy  6. Bilateral foot pain  M79.671 Ambulatory referral to Podiatry   M79.672   7. Hormone replacement therapy (postmenopausal)  Z79.890 estrogens, conjugated, (PREMARIN) 0.3 MG tablet   wants to continue low dosing - OK with cardiology  8. Need for influenza vaccination  Z23 Flu Vaccine QUAD High Dose(Fluad)      Return in about 4 months (around 08/18/2019) for Routine follow-up and due for MWV (was skipped due to covid).   Delsa Grana, PA-C 04/18/19 10:50 AM

## 2019-04-19 LAB — HEMOGLOBIN A1C
Hgb A1c MFr Bld: 6 % of total Hgb — ABNORMAL HIGH (ref <5.7)
Mean Plasma Glucose: 126 (calc)
eAG (mmol/L): 7 (calc)

## 2019-04-19 LAB — BASIC METABOLIC PANEL WITH GFR
BUN/Creatinine Ratio: 38 (calc) — ABNORMAL HIGH (ref 6–22)
BUN: 28 mg/dL — ABNORMAL HIGH (ref 7–25)
CO2: 25 mmol/L (ref 20–32)
Calcium: 9.4 mg/dL (ref 8.6–10.4)
Chloride: 106 mmol/L (ref 98–110)
Creat: 0.73 mg/dL (ref 0.60–0.88)
GFR, Est African American: 86 mL/min/{1.73_m2} (ref >=60)
GFR, Est Non African American: 75 mL/min/{1.73_m2} (ref >=60)
Glucose, Bld: 102 mg/dL — ABNORMAL HIGH (ref 65–99)
Potassium: 4.2 mmol/L (ref 3.5–5.3)
Sodium: 139 mmol/L (ref 135–146)

## 2019-04-26 ENCOUNTER — Other Ambulatory Visit: Payer: Self-pay | Admitting: Family Medicine

## 2019-04-26 DIAGNOSIS — Z79890 Hormone replacement therapy (postmenopausal): Secondary | ICD-10-CM

## 2019-05-03 ENCOUNTER — Telehealth: Payer: Self-pay | Admitting: Urology

## 2019-05-03 NOTE — Telephone Encounter (Signed)
NO PA REQUIRED FOR PTNS CALLED PATIENT TO SCHEDULE HER PTNS AGAIN AND HAD TO LM SHE NEEDS 12 VISITS I WAS GOING TO TRY AND START HER ON 06-14-19 THEY NEED TO BE FOR [place]

## 2019-05-06 ENCOUNTER — Ambulatory Visit: Payer: Medicare Other

## 2019-05-07 ENCOUNTER — Telehealth: Payer: Self-pay | Admitting: Urology

## 2019-05-07 DIAGNOSIS — M5416 Radiculopathy, lumbar region: Secondary | ICD-10-CM | POA: Diagnosis not present

## 2019-05-07 DIAGNOSIS — M5134 Other intervertebral disc degeneration, thoracic region: Secondary | ICD-10-CM | POA: Diagnosis not present

## 2019-05-07 DIAGNOSIS — M9902 Segmental and somatic dysfunction of thoracic region: Secondary | ICD-10-CM | POA: Diagnosis not present

## 2019-05-07 DIAGNOSIS — M62830 Muscle spasm of back: Secondary | ICD-10-CM | POA: Diagnosis not present

## 2019-05-07 NOTE — Telephone Encounter (Signed)
I spoke with patient today and she stated that she was not ready to start PTNS at this time in her life. 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 I did offer her in the future if and when she was ready to just call me back and we could revisit this and get her scheduled whenever she was ready. She was in agreement with this and said she would do that.   Sharyn Lull

## 2019-05-08 ENCOUNTER — Ambulatory Visit: Payer: Medicare Other | Attending: Family Medicine

## 2019-05-08 ENCOUNTER — Other Ambulatory Visit: Payer: Self-pay

## 2019-05-08 DIAGNOSIS — M25561 Pain in right knee: Secondary | ICD-10-CM | POA: Diagnosis not present

## 2019-05-08 DIAGNOSIS — M79671 Pain in right foot: Secondary | ICD-10-CM | POA: Diagnosis not present

## 2019-05-08 DIAGNOSIS — M79672 Pain in left foot: Secondary | ICD-10-CM | POA: Insufficient documentation

## 2019-05-08 DIAGNOSIS — M6281 Muscle weakness (generalized): Secondary | ICD-10-CM

## 2019-05-08 DIAGNOSIS — R262 Difficulty in walking, not elsewhere classified: Secondary | ICD-10-CM | POA: Insufficient documentation

## 2019-05-08 DIAGNOSIS — M25562 Pain in left knee: Secondary | ICD-10-CM | POA: Diagnosis not present

## 2019-05-08 DIAGNOSIS — G8929 Other chronic pain: Secondary | ICD-10-CM | POA: Diagnosis not present

## 2019-05-08 NOTE — Therapy (Signed)
[place] [facility] [address], [place], [address] Phone: [phone]   Fax:  [phone]  Physical Therapy Treatment  Patient Details  Name: Angel [name] MRN: [id number] Date of Birth: Apr 29, 1931 Referring Provider (PT): Delsa Grana, [place]   Encounter Date: 05/08/2019  PT End of Session - 05/08/19 1035    Visit Number  23    Number of Visits  61    Date for PT Re-Evaluation  06/20/19    Authorization Type  3    Authorization Time Period  10    PT Start Time  1035    PT Stop Time  1116    PT Time Calculation (min)  41 min    Activity Tolerance  Patient tolerated treatment well    Behavior During Therapy  [facility] for tasks assessed/performed       Past Medical History:  Diagnosis Date  . Anxiety   . Aortic aneurysm without rupture ([place])   . CAD (coronary artery disease)   . Chronic hip pain   . Endometriosis   . Hearing loss   . HTN, goal below 150/90   . Hyperlipidemia LDL goal <70 11/18/2014  . Osteopenia of the elderly   . Urge and stress incontinence     Past Surgical History:  Procedure Laterality Date  . ABDOMINAL HYSTERECTOMY  1976   total  . APPENDECTOMY    . BREAST EXCISIONAL BIOPSY Left 1987   benign    There were no vitals filed for this visit.  Subjective Assessment - 05/08/19 1037    Subjective  Both knees are alright. Used to having some tension in her back, occasionally feet discomfort. Standing up from sitting still bothers both knees. 0/10 B knee pain currently, 3.5/10 B knee pain at most for the past 3 weeks. 0/10 B foot pain currently. 4/10 B foot pain at most for the past 3 weeks. Pain is at times at her arch or base of the heel. 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    Pertinent History  B knee pain since a year or more. Walking up the steps bothers her. Walking around regularly is no pain. Pt lives in a 2  story home, first floor set up. 14 steps to get to second floor, L banister rail starting a third of the way up.  No steps to enter front and back door.  Gradual onset of B knee pain. Has been to Dr. Marry Guan who did x-rays who said that there is still cartilage in both knees. Was diagnosed with osteoarthritis.  [place]. Walking up hills bothers her knees.  Pt also walks about 20 minutes a day walking her dog.  R knee feels fragile when she starts with it to step up a step therefore she starts with her L, then she can step up with her R LE. Also goes to a chiropracter for adjustments.  Pt also has 5 step to go down to her meditation room, R rail assist in her home.  Has not yet used ice for her knees.   Pt adds that she has B foot pronation and plantar arch pain in which her orthotics that she wears helps her a lot.     Currently in Pain?  No/denies    Pain Score  0-No pain  [facility] PT Assessment - 05/08/19 1034      Assessment   Referring Provider (PT)  Delsa Grana, PA-C      Strength   Right Hip Extension  4+/5   seated manually resisted hip extension   Right Hip ABduction  4+/5   seated manually resisted clamshell   Left Hip Extension  5/5   seated manually resisted hip extension   Left Hip ABduction  5/5   seated manually resisted clamshell                          PT Education - 05/08/19 1050    Education Details  ther-ex    Person(s) Educated  Patient    Methods  Explanation;Demonstration;Tactile cues;Verbal cues    Comprehension  Verbalized understanding      Objective  MedbridgeAccess Code: [email or web address]   No latex band allergies Blood pressure is controlled per pt.  Aortic aneurism osteopenia  Pt demonstrates B foot pronation. Pt states wearing orthotics  Pt states that she got a letter from Dr. Sanda Klein stating that she is leaving the practice due to health issues. Pt was  recommended to see their nurse practitioner [name]   Pt states that getting up from the couch is hard for her.     Has not been doing her HEP because she had to move out of retirement to work on her wedding business    Therapeutic exercise  Seated manually resisted hip extension and clamshell isometric (hips less than 90 degrees flexion) 1x each way for each LE.   Reviewed progress/current status with hip strength with pt.   Gait x 6 minutes    1185 ft  Reviewed progress with pt.   Ascending and desecending 4 regular steps witih B UE assist 3x  Slight pain with initial attempt. No pain at 3rd attempt. Cues for femoral control   Seated manually resisted hip extension              R 10x3             L 10x3  Seated manually resisted medial hamstrings flexion  R 10x2 with 5 second holds to decrease R tibial ER  Reviewed plan of care: continue with PT for 2x per week for 6 weeks   Improved exercise technique, movement at target joints, use of target muscles after min to mod verbal, visual, tactile cues.    Response to treatment Good muscle use felt with exercises.No B knee or foot pain after session    Clinical impression Pt returns for about a 1.5 month hiatus from physical therapy. She currently presenst with increased bilateral knee and foot pain at most, slight decrease in glute max strength since her last progress report. Pt maintains progress with ascending and descending 4 regular steps with B UE assist in the clinic with femoral control as well as demonstrates improved distance walked in 6 minutes. Currently has difficulty performing her HEP due to her work schedule. Pt will benefit from continued skilled physical therapy services to decrease bilateral knee and foot pain, improve LE strength, and function.      PT Short Term Goals - 09/20/18 1905      PT SHORT TERM GOAL #1   Title  Patient will be independent with her  HEP to decrease pain, improve strength and function.     Time  3    Period  Weeks    Status  On-going  Target Date  10/11/18        PT Long Term Goals - 05/08/19 1051      PT LONG TERM GOAL #1   Title  Patient will have a decrease in B knee pain to 2/10 or less at worst to decrease difficulty negotiating stairs or climbing up hills.     Baseline  R knee 5/10 , L knee 4.5/10 at worst for the past 3 months (07/16/2018); 6/10 R knee pain at most, 4.5/10 L knee pain at most for the past 7 days (09/20/2018); 3/10 B knee pain at worst for the past 7 days (09/27/2018); 3/10 B knee pain at worst for the past 2 weeks (12/25/2018). 1.5/10 R knee pain (except with stairs), 1.5/10 L knee pain at most for the past 7 days except with steps/stairs (01/22/2019); 3.5/10 for both knees at most for the past 7 days going up and down steps. 2/10 at most B knees without the stairs (02/07/2019); 2.5/10 B knee pain at most for the past 7 days including stairs (03/12/2019); 3.5/10 B knee pain at most for the past 3 weeks (05/08/2019)    Time  6    Period  Weeks    Status  Partially Met    Target Date  06/20/19      PT LONG TERM GOAL #2   Title  Patient will be able to ascend and descend 4 regular steps with bilateral UE assist at least 2 x with minimal to no complain of B knee pain.     Baseline  Increased B knee pain with ascending and descending 4 regular steps with B UE assist (07/16/2018). Still has pain but feels slight improvement (09/20/2018); increased B knee pain with stairs (12/25/2018), (01/22/2019); Minimal to no complain of B knee pain (03/12/2019)    Time  6    Period  Weeks    Status  Achieved      PT LONG TERM GOAL #3   Title  Patient will improve B hip extension and abduction strengh by at least 1/2 MMT to promote ability to negotiate stairs and climb hills with less pain.     Time  6    Period  Weeks    Status  Partially Met    Target Date  06/20/19      PT LONG TERM GOAL #4   Title  Patient will  improve her 6 minute walk distance by at least 100 ft to promote mobility.     Baseline  1045 ft with L lateral hip pain/discomfort (07/16/2018); 1082 ft (09/20/2018); 910 ft (12/25/2018); 1008 ft, no pain or discomfort (01/22/2019); 1063 ft (02/07/2019); 985 ft  (03/12/19); 1185 ft (05/08/2019)    Time  6    Period  Weeks    Status  Achieved      PT LONG TERM GOAL #5   Title  Patient will have a decrease in R and L foot pain to 2/10 or less at worst to promote ability to ambulate with less difficulty.     Baseline  5/10 R foot pain, 3/10 L foot pain at most for the past 2 weeks (12/25/2018); 3.5/10 R foot and L foot pain at most for the past 7 days (01/22/2019); 2.5/10 B foot pain at most for the past 7 days (02/07/2019); 4/10 at most R foot for the past 7 days (03/12/2019); 4/10 B foot pain at most for the past 3 weeks (05/08/2019)    Time  6    Period  Weeks    Status  Partially Met    Target Date  06/20/19            Plan - 05/08/19 1051    Clinical Impression Statement  Pt returns for about a 1.5 month hiatus from physical therapy. She currently presenst with increased bilateral knee and foot pain at most, slight decrease in glute max strength since her last progress report. Pt maintains progress with ascending and descending 4 regular steps with B UE assist in the clinic with femoral control as well as demonstrates improved distance walked in 6 minutes. Currently has difficulty performing her HEP due to her work schedule. Pt will benefit from continued skilled physical therapy services to decrease bilateral knee and foot pain, improve LE strength, and function.    Personal Factors and Comorbidities  Age;Comorbidity 2;Fitness;Time since onset of injury/illness/exacerbation    Comorbidities  Osteopenia, aortic anneurism    Examination-Activity Limitations  Stairs    Stability/Clinical Decision Making  Stable/Uncomplicated    Clinical Decision Making  Low    Clinical Presentation due to:  improved 6  minute walk distance, maintained progress with stair negotiation in the clinic    Rehab Potential  Fair    Clinical Impairments Affecting Rehab Potential  age, work schedule    PT Frequency  2x / week    PT Duration  6 weeks    PT Treatment/Interventions  Aquatic Therapy;Electrical Stimulation;Iontophoresis 46m/ml Dexamethasone;Gait training;Stair training;Therapeutic activities;Therapeutic exercise;Balance training;Neuromuscular re-education;Patient/family education;Manual techniques;Dry needling    PT Next Visit Plan  hip strengthening, femoral control, manual techniques, modalities PRN    PT Home Exercise Plan  Medbridge Access code [id number]   Consulted and Agree with Plan of Care  Patient       Patient will benefit from skilled therapeutic intervention in order to improve the following deficits and impairments:  Pain, Postural dysfunction, Improper body mechanics, Decreased strength, Decreased range of motion  Visit Diagnosis: Chronic pain of right knee - Plan: PT plan of care cert/re-cert  Difficulty in walking, not elsewhere classified - Plan: PT plan of care cert/re-cert  Chronic pain of left knee - Plan: PT plan of care cert/re-cert  Pain in right foot - Plan: PT plan of care cert/re-cert  Muscle weakness (generalized) - Plan: PT plan of care cert/re-cert  Pain in left foot - Plan: PT plan of care cert/re-cert     Problem List Patient Active Problem List   Diagnosis Date Noted  . Arthritis of knee, degenerative 05/22/2018  . Cataract, right eye 05/17/2018  . Aortic stenosis, mild 02/22/2018  . Obesity (BMI 30.0-34.9) 01/18/2018  . Hip pain, chronic 04/02/2015  . Hearing loss of both ears 04/02/2015  . Osteopenia of the elderly 04/02/2015  . Hormone replacement therapy (postmenopausal) 04/02/2015  . [place] 04/[place]  . Situational anxiety 04/02/2015  . Hyperlipidemia LDL goal <70 11/18/2014  . Mixed stress and urge urinary  incontinence 06/29/2014  . Hypertension goal BP (blood pressure) < 140/90 11/06/2013  . CAD (coronary artery disease) 11/06/2013    MJoneen BoersPT, DPT   05/08/2019, 12:58 PM  [place] [facility] [address] [place] [id number]: [phone]  Fax:  [phone] Name: Angel GARROWMRN: [id number] of Birth: 4December 22, 1932

## 2019-05-13 ENCOUNTER — Ambulatory Visit: Payer: Medicare Other

## 2019-05-20 ENCOUNTER — Other Ambulatory Visit: Payer: Self-pay

## 2019-05-20 ENCOUNTER — Ambulatory Visit: Payer: Medicare Other | Attending: Family Medicine

## 2019-05-20 DIAGNOSIS — M25562 Pain in left knee: Secondary | ICD-10-CM | POA: Insufficient documentation

## 2019-05-20 DIAGNOSIS — M79671 Pain in right foot: Secondary | ICD-10-CM | POA: Diagnosis not present

## 2019-05-20 DIAGNOSIS — M25561 Pain in right knee: Secondary | ICD-10-CM | POA: Diagnosis not present

## 2019-05-20 DIAGNOSIS — M79672 Pain in left foot: Secondary | ICD-10-CM | POA: Diagnosis not present

## 2019-05-20 DIAGNOSIS — R262 Difficulty in walking, not elsewhere classified: Secondary | ICD-10-CM | POA: Insufficient documentation

## 2019-05-20 DIAGNOSIS — M6281 Muscle weakness (generalized): Secondary | ICD-10-CM | POA: Insufficient documentation

## 2019-05-20 DIAGNOSIS — G8929 Other chronic pain: Secondary | ICD-10-CM

## 2019-05-20 NOTE — Therapy (Signed)
[place] [facility] [address] [place], [address] Phone: [phone]   Fax:  [phone]  Physical Therapy Treatment  Patient Details  Name: Angel [name] MRN: [id number] Date of Birth: 1931/06/29 Referring Provider (PT): Delsa Grana, [place]   Encounter Date: 05/20/2019  PT End of Session - 05/20/19 1538    Visit Number  24    Number of Visits  61    Date for PT Re-Evaluation  06/20/19    Authorization Type  4    Authorization Time Period  10    PT Start Time  1539    PT Stop Time  1617    PT Time Calculation (min)  38 min    Activity Tolerance  Patient tolerated treatment well    Behavior During Therapy  [name] for tasks assessed/performed       Past Medical History:  Diagnosis Date  . Anxiety   . Aortic aneurysm without rupture ([place])   . CAD (coronary artery disease)   . Chronic hip pain   . Endometriosis   . Hearing loss   . HTN, goal below 150/90   . Hyperlipidemia LDL goal <70 11/18/2014  . Osteopenia of the elderly   . Urge and stress incontinence     Past Surgical History:  Procedure Laterality Date  . ABDOMINAL HYSTERECTOMY  1976   total  . APPENDECTOMY    . BREAST EXCISIONAL BIOPSY Left 1987   benign    There were no vitals filed for this visit.  Subjective Assessment - 05/20/19 1540    Subjective  Feet are bothering her today. Also has knee pain when standing up from the chair. Took a long walk today, 6 long blocks. 4/10 B feet, 3.5/10 B knees.    Pertinent History  B knee pain since a year or more. Walking up the steps bothers her. Walking around regularly is no pain. Pt lives in a 2 story home, first floor set up. 14 steps to get to second floor, L banister rail starting a third of the way up.  No steps to enter front and back door.  Gradual onset of B knee pain. Has been to Dr. Marry Guan who did x-rays who said that there is still cartilage in both knees. Was diagnosed with osteoarthritis.   [place]. Walking up hills bothers her knees.  Pt also walks about 20 minutes a day walking her dog.  R knee feels fragile when she starts with it to step up a step therefore she starts with her L, then she can step up with her R LE. Also goes to a chiropracter for adjustments.  Pt also has 5 step to go down to her meditation room, R rail assist in her home.  Has not yet used ice for her knees.   Pt adds that she has B foot pronation and plantar arch pain in which her orthotics that she wears helps her a lot.     Currently in Pain?  Yes    Pain Score  4                                PT Education - 05/20/19 1546    Education Details  ther-ex    Person(s) Educated  Patient    Methods  Explanation;Demonstration;Tactile cues;Verbal cues  Comprehension  Verbalized understanding;Returned demonstration       Objective  MedbridgeAccess Code: [email or web address]   No latex band allergies Blood pressure is controlled per pt.  Aortic aneurism osteopenia  Pt demonstrates B foot pronation. Pt states wearing orthotics  Pt states that she got a letter from Dr. Sanda Klein stating that she is leaving the practice due to health issues. Pt was recommended to see their nurse practitioner [name]   Pt states that getting up from the couch is hard for her.     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 B foot pain at arch.   B foot pronation.    Then with hip abduction/ER. Decreased foot pain, increased B medial knee pain  Seated hip adduction isometrics glute and ball squeeze 10x10 seconds    No change in knee pain with sit <> stand  Seated knee flexion resisting green band targeting medial hamstrings   R 10x3   L 10x3   Decreased bilateral  medial knee pain with sit <> stand  Seated hip extension isometrics  R 10x5 seconds for 2 sets  L 10x5 seconds for 2 sets  Decreased bilateral knee pain.   Improved exercise technique, movement at target joints, use of target muscles after mod verbal, visual, tactile cues.     Manual therapy    Seated STM B lateral hamstrings, IT band, vastus lateralis to decrease tension   Seated STM medial knees    Response to treatment Good muscle use felt with exercises.No B knee or foot pain after session  Clinical impression Decreased B knee pain with sit <> stand with treatment to promote medial hamstrings and glute max muscle activation. Decreased bilateral plantar foot pain with sit <> stand with cues for glute max muscle activation and hip abduction/ER. Pt will benefit from continued skilled physical therapy services to decrease pain, improve strength and function.     PT Short Term Goals - 09/20/18 1905      PT SHORT TERM GOAL #1   Title  Patient will be independent with her HEP to decrease pain, improve strength and function.     Time  3    Period  Weeks    Status  On-going    Target Date  10/11/18        PT Long Term Goals - 05/08/19 1051      PT LONG TERM GOAL #1   Title  Patient will have a decrease in B knee pain to 2/10 or less at worst to decrease difficulty negotiating stairs or climbing up hills.     Baseline  R knee 5/10 , L knee 4.5/10 at worst for the past 3 months (07/16/2018); 6/10 R knee pain at most, 4.5/10 L knee pain at most for the past 7 days (09/20/2018); 3/10 B knee pain at worst for the past 7 days (09/27/2018); 3/10 B knee pain at worst for the past 2 weeks (12/25/2018). 1.5/10 R knee pain (except with stairs), 1.5/10 L knee pain at most for the past 7 days except with steps/stairs (01/22/2019); 3.5/10 for both knees at most for the past 7 days going up and down steps. 2/10 at most B knees without the stairs (02/07/2019); 2.5/10 B knee pain at most for the  past 7 days including stairs (03/12/2019); 3.5/10 B knee pain at  most for the past 3 weeks (05/08/2019)    Time  6    Period  Weeks    Status  Partially Met    Target Date  06/20/19      PT LONG TERM GOAL #2   Title  Patient will be able to ascend and descend 4 regular steps with bilateral UE assist at least 2 x with minimal to no complain of B knee pain.     Baseline  Increased B knee pain with ascending and descending 4 regular steps with B UE assist (07/16/2018). Still has pain but feels slight improvement (09/20/2018); increased B knee pain with stairs (12/25/2018), (01/22/2019); Minimal to no complain of B knee pain (03/12/2019)    Time  6    Period  Weeks    Status  Achieved      PT LONG TERM GOAL #3   Title  Patient will improve B hip extension and abduction strengh by at least 1/2 MMT to promote ability to negotiate stairs and climb hills with less pain.     Time  6    Period  Weeks    Status  Partially Met    Target Date  06/20/19      PT LONG TERM GOAL #4   Title  Patient will improve her 6 minute walk distance by at least 100 ft to promote mobility.     Baseline  1045 ft with L lateral hip pain/discomfort (07/16/2018); 1082 ft (09/20/2018); 910 ft (12/25/2018); 1008 ft, no pain or discomfort (01/22/2019); 1063 ft (02/07/2019); 985 ft  (03/12/19); 1185 ft (05/08/2019)    Time  6    Period  Weeks    Status  Achieved      PT LONG TERM GOAL #5   Title  Patient will have a decrease in R and L foot pain to 2/10 or less at worst to promote ability to ambulate with less difficulty.     Baseline  5/10 R foot pain, 3/10 L foot pain at most for the past 2 weeks (12/25/2018); 3.5/10 R foot and L foot pain at most for the past 7 days (01/22/2019); 2.5/10 B foot pain at most for the past 7 days (02/07/2019); 4/10 at most R foot for the past 7 days (03/12/2019); 4/10 B foot pain at most for the past 3 weeks (05/08/2019)    Time  6    Period  Weeks    Status  Partially Met    Target Date  06/20/19             Plan - 05/20/19 1547    Clinical Impression Statement  Decreased B knee pain with sit <> stand with treatment to promote medial hamstrings and glute max muscle activation. Decreased bilateral plantar foot pain with sit <> stand with cues for glute max muscle activation and hip abduction/ER. Pt will benefit from continued skilled physical therapy services to decrease pain, improve strength and function.    Personal Factors and Comorbidities  Age;Comorbidity 2;Fitness;Time since onset of injury/illness/exacerbation    Comorbidities  Osteopenia, aortic anneurism    Examination-Activity Limitations  Stairs    Stability/Clinical Decision Making  Stable/Uncomplicated    Rehab Potential  Fair    Clinical Impairments Affecting Rehab Potential  age, work schedule    PT Frequency  2x / week    PT Duration  6 weeks    PT Treatment/Interventions  Aquatic Therapy;Electrical Stimulation;Iontophoresis 55m/ml Dexamethasone;Gait training;Stair training;Therapeutic activities;Therapeutic exercise;Balance training;Neuromuscular re-education;Patient/family education;Manual techniques;Dry needling  PT Next Visit Plan  hip strengthening, femoral control, manual techniques, modalities PRN    PT Home Exercise Plan  Medbridge Access code [phone]    Consulted and Agree with Plan of Care  Patient       Patient will benefit from skilled therapeutic intervention in order to improve the following deficits and impairments:  Pain, Postural dysfunction, Improper body mechanics, Decreased strength, Decreased range of motion  Visit Diagnosis: Difficulty in walking, not elsewhere classified  Chronic pain of right knee  Chronic pain of left knee  Pain in left foot  Muscle weakness (generalized)  Pain in right foot     Problem List Patient Active Problem List   Diagnosis Date Noted  . Arthritis of knee, degenerative 05/22/2018  . Cataract, right eye 05/17/2018  . Aortic stenosis, mild 02/22/2018   . Obesity (BMI 30.0-34.9) 01/18/2018  . Hip pain, chronic 04/02/2015  . Hearing loss of both ears 04/02/2015  . Osteopenia of the elderly 04/02/2015  . Hormone replacement therapy (postmenopausal) 04/02/2015  . Aortic aneurysm without rupture ([place]) 04/02/2015  . Situational anxiety 04/02/2015  . Hyperlipidemia LDL goal <70 11/18/2014  . Mixed stress and urge urinary incontinence 06/29/2014  . Hypertension goal BP (blood pressure) < 140/90 11/06/2013  . CAD (coronary artery disease) 11/06/2013    Joneen Boers PT, DPT   05/20/2019, 5:56 PM  [facility] [address] [place], [address] Phone: [phone]   Fax:  [phone]  Name: Angel [name] MRN: [id number] Date of Birth: [birth year]

## 2019-05-21 ENCOUNTER — Other Ambulatory Visit: Payer: Self-pay

## 2019-05-21 DIAGNOSIS — Z79890 Hormone replacement therapy (postmenopausal): Secondary | ICD-10-CM

## 2019-05-21 MED ORDER — ESTROGENS CONJUGATED 0.3 MG PO TABS: ORAL_TABLET | ORAL | 1 refills | Status: DC

## 2019-05-22 ENCOUNTER — Ambulatory Visit: Payer: Medicare Other

## 2019-05-22 ENCOUNTER — Other Ambulatory Visit: Payer: Self-pay

## 2019-05-22 DIAGNOSIS — R262 Difficulty in walking, not elsewhere classified: Secondary | ICD-10-CM | POA: Diagnosis not present

## 2019-05-22 DIAGNOSIS — M6281 Muscle weakness (generalized): Secondary | ICD-10-CM

## 2019-05-22 DIAGNOSIS — G8929 Other chronic pain: Secondary | ICD-10-CM | POA: Diagnosis not present

## 2019-05-22 DIAGNOSIS — M25562 Pain in left knee: Secondary | ICD-10-CM | POA: Diagnosis not present

## 2019-05-22 DIAGNOSIS — M25561 Pain in right knee: Secondary | ICD-10-CM | POA: Diagnosis not present

## 2019-05-22 DIAGNOSIS — M79672 Pain in left foot: Secondary | ICD-10-CM

## 2019-05-22 DIAGNOSIS — M79671 Pain in right foot: Secondary | ICD-10-CM

## 2019-05-22 NOTE — Therapy (Signed)
[facility] [address] [place], [address] Phone: [phone]   Fax:  [phone]  Physical Therapy Treatment  Patient Details  Name: Angel [name] MRN: [id number] Date of Birth: 05/25/1931 Referring Provider (PT): Delsa Grana, [place]   Encounter Date: 05/22/2019  PT End of Session - 05/22/19 1353    Visit Number  25    Number of Visits  61    Date for PT Re-Evaluation  06/20/19    Authorization Type  5    Authorization Time Period  10    PT Start Time  1355   pt arrived late   PT Stop Time  1425    PT Time Calculation (min)  30 min    Activity Tolerance  Patient tolerated treatment well    Behavior During Therapy  [facility] for tasks assessed/performed       Past Medical History:  Diagnosis Date  . Anxiety   . Aortic aneurysm without rupture ([place])   . CAD (coronary artery disease)   . Chronic hip pain   . Endometriosis   . Hearing loss   . HTN, goal below 150/90   . Hyperlipidemia LDL goal <70 11/18/2014  . Osteopenia of the elderly   . Urge and stress incontinence     Past Surgical History:  Procedure Laterality Date  . ABDOMINAL HYSTERECTOMY  1976   total  . APPENDECTOMY    . BREAST EXCISIONAL BIOPSY Left 1987   benign    There were no vitals filed for this visit.  Subjective Assessment - 05/22/19 1356    Subjective  B knees and feet are so much better now compared to Monday. No pain currently.    Pertinent History  B knee pain since a year or more. Walking up the steps bothers her. Walking around regularly is no pain. Pt lives in a 2 story home, first floor set up. 14 steps to get to second floor, L banister rail starting a third of the way up.  No steps to enter front and back door.  Gradual onset of B knee pain. Has been to Dr. Marry Guan who did x-rays who said that there is still cartilage in both knees. Was diagnosed with osteoarthritis.  [place]. Walking up hills bothers her knees.  Pt also walks about 20 minutes a day walking her dog.  R knee feels fragile when she starts with it to step up a step therefore she starts with her L, then she can step up with her R LE. Also goes to a chiropracter for adjustments.  Pt also has 5 step to go down to her meditation room, R rail assist in her home.  Has not yet used ice for her knees.   Pt adds that she has B foot pronation and plantar arch pain in which her orthotics that she wears helps her a lot.     Currently in Pain?  No/denies    Pain Score  0-No pain                               PT Education - 05/22/19 1403    Education Details  ther-ex    Person(s) Educated  Patient    Methods  Explanation;Demonstration;Tactile cues;Verbal cues    Comprehension  Verbalized understanding;Returned demonstration  Objective  MedbridgeAccess Code: [email or web address]   No latex band allergies Blood pressure is controlled per pt.  Aortic aneurism osteopenia  Pt demonstrates B foot pronation. Pt states wearing orthotics  Pt states that she got a letter from Dr. Sanda Klein stating that she is leaving the practice due to health issues. Pt was recommended to see their nurse practitioner [name]   Pt states that getting up from the couch is hard for her.     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       Manual therapy   Seated STM B lateral hamstrings, IT band, vastus lateralis to decrease tension   Seated mayfascial release B lateral knees  Response to treatment Good muscle use felt with exercises.No B knee or foot pain after session  Clinical impression Good carry over of decreased B knee and foot pain from last session. Continued working on improving glute, and medial hamstrings strengthening as well as decreasing lateral thigh and knee tension to promote proper mechancis at B knees. Pt tolerated session well without complain of pain. Pt will benefit from continued skilled physical therapy services to decrease pain, improve strength, function, and decrease difficulty with gait.     PT Short Term Goals - 09/20/18 1905      PT SHORT TERM GOAL #1   Title  Patient will be independent with her HEP to decrease pain, improve strength and function.     Time  3    Period  Weeks    Status  On-going    Target Date  10/11/18        PT Long Term Goals - 05/08/19 1051      PT LONG TERM GOAL #1   Title  Patient will have a decrease in B knee pain to 2/10 or less at worst to decrease difficulty negotiating stairs or climbing up hills.     Baseline  R knee 5/10 , L knee 4.5/10 at worst for the past 3 months (07/16/2018); 6/10 R knee pain at most, 4.5/10 L knee pain at most for the past 7 days (09/20/2018); 3/10 B knee pain at worst for the past 7 days (09/27/2018); 3/10 B knee pain at worst for the past 2 weeks (12/25/2018). 1.5/10 R knee pain (except with stairs), 1.5/10 L knee pain at most for the past 7 days except with steps/stairs (01/22/2019); 3.5/10 for both knees at most for the past 7 days going up and down steps. 2/10 at most B knees without the stairs (02/07/2019); 2.5/10 B knee pain at most for the past  7 days including stairs (03/12/2019); 3.5/10 B knee pain at most for the past 3 weeks (05/08/2019)    Time  6    Period  Weeks    Status  Partially Met    Target Date   06/20/19      PT LONG TERM GOAL #2   Title  Patient will be able to ascend and descend 4 regular steps with bilateral UE assist at least 2 x with minimal to no complain of B knee pain.     Baseline  Increased B knee pain with ascending and descending 4 regular steps with B UE assist (07/16/2018). Still has pain but feels slight improvement (09/20/2018); increased B knee pain with stairs (12/25/2018), (01/22/2019); Minimal to no complain of B knee pain (03/12/2019)    Time  6    Period  Weeks    Status  Achieved      PT LONG TERM GOAL #3   Title  Patient will improve B hip extension and abduction strengh by at least 1/2 MMT to promote ability to negotiate stairs and climb hills with less pain.     Time  6    Period  Weeks    Status  Partially Met    Target Date  06/20/19      PT LONG TERM GOAL #4   Title  Patient will improve her 6 minute walk distance by at least 100 ft to promote mobility.     Baseline  1045 ft with L lateral hip pain/discomfort (07/16/2018); 1082 ft (09/20/2018); 910 ft (12/25/2018); 1008 ft, no pain or discomfort (01/22/2019); 1063 ft (02/07/2019); 985 ft  (03/12/19); 1185 ft (05/08/2019)    Time  6    Period  Weeks    Status  Achieved      PT LONG TERM GOAL #5   Title  Patient will have a decrease in R and L foot pain to 2/10 or less at worst to promote ability to ambulate with less difficulty.     Baseline  5/10 R foot pain, 3/10 L foot pain at most for the past 2 weeks (12/25/2018); 3.5/10 R foot and L foot pain at most for the past 7 days (01/22/2019); 2.5/10 B foot pain at most for the past 7 days (02/07/2019); 4/10 at most R foot for the past 7 days (03/12/2019); 4/10 B foot pain at most for the past 3 weeks (05/08/2019)    Time  6    Period  Weeks    Status  Partially Met    Target Date  06/20/19            Plan - 05/22/19 1404    Clinical Impression Statement  Good carry over of decreased B knee and foot pain from last session. Continued working on improving glute, and  medial hamstrings strengthening as well as decreasing lateral thigh and knee tension to promote proper mechancis at B knees. Pt tolerated session well without complain of pain. Pt will benefit from continued skilled physical therapy services to decrease pain, improve strength, function, and decrease difficulty with gait.    Personal Factors and Comorbidities  Age;Comorbidity 2;Fitness;Time since onset of injury/illness/exacerbation    Comorbidities  Osteopenia, aortic anneurism    Examination-Activity Limitations  Stairs    Stability/Clinical Decision Making  Stable/Uncomplicated    Rehab Potential  Fair    Clinical Impairments Affecting Rehab Potential  age, work schedule    PT Frequency  2x / week    PT Duration  6 weeks    PT Treatment/Interventions  Aquatic Therapy;Electrical Stimulation;Iontophoresis 40m/ml Dexamethasone;Gait training;Stair training;Therapeutic activities;Therapeutic exercise;Balance training;Neuromuscular re-education;Patient/family education;Manual techniques;Dry needling    PT Next Visit Plan  hip strengthening, femoral control, manual techniques, modalities PRN    PT Home Exercise Plan  Medbridge Access code [id number]   Consulted and Agree with Plan of Care  Patient       Patient will benefit from skilled therapeutic intervention in order to improve the following deficits and impairments:  Pain, Postural dysfunction, Improper body mechanics, Decreased strength, Decreased range of motion  Visit Diagnosis: Difficulty in walking, not elsewhere classified  Chronic pain of right knee  Chronic pain of left knee  Pain in left foot  Muscle weakness (generalized)  Pain in right foot     Problem List Patient Active Problem List   Diagnosis Date Noted  . Arthritis of knee, degenerative 05/22/2018  . Cataract, right eye 05/17/2018  . Aortic stenosis, mild 02/22/2018  . Obesity (BMI 30.0-34.9) 01/18/2018  . Hip pain, chronic 04/02/2015  . Hearing loss of both  ears 04/02/2015  . Osteopenia of the elderly 04/02/2015  . Hormone replacement therapy (postmenopausal) 04/02/2015  . [place] aneurysm without rupture ([place] 04/02/2015  . Situational anxiety 04/02/2015  . Hyperlipidemia LDL goal <70 11/18/2014  . Mixed stress and urge urinary incontinence 06/29/2014  . Hypertension goal BP (blood pressure) < 140/90 11/06/2013  . CAD (coronary artery disease) 11/06/2013    MJoneen BoersPT, DPT   05/22/2019, 7:14 PM  [facility] [address] [place] [address]Phone: [phone]  Fax:  [phone] Name: Angel PUCCIARELLIMRN: [id number] of Birth: 4November 17, 1932

## 2019-05-27 ENCOUNTER — Ambulatory Visit: Payer: Medicare Other

## 2019-05-27 ENCOUNTER — Other Ambulatory Visit: Payer: Self-pay

## 2019-05-27 DIAGNOSIS — R262 Difficulty in walking, not elsewhere classified: Secondary | ICD-10-CM

## 2019-05-27 DIAGNOSIS — M25562 Pain in left knee: Secondary | ICD-10-CM | POA: Diagnosis not present

## 2019-05-27 DIAGNOSIS — G8929 Other chronic pain: Secondary | ICD-10-CM | POA: Diagnosis not present

## 2019-05-27 DIAGNOSIS — M79672 Pain in left foot: Secondary | ICD-10-CM

## 2019-05-27 DIAGNOSIS — M6281 Muscle weakness (generalized): Secondary | ICD-10-CM | POA: Diagnosis not present

## 2019-05-27 DIAGNOSIS — M25561 Pain in right knee: Secondary | ICD-10-CM

## 2019-05-27 DIAGNOSIS — M79671 Pain in right foot: Secondary | ICD-10-CM

## 2019-05-27 NOTE — Therapy (Signed)
[facility] [address] [place], [address] Phone: [phone]   Fax:  [phone]  Physical Therapy Treatment  Patient Details  Name: Angel [name] MRN: [id number] Date of Birth: 06/27/1931 Referring Provider (PT): Delsa Grana, [place]   Encounter Date: 05/27/2019  PT End of Session - 05/27/19 1537    Visit Number  26    Number of Visits  61    Date for PT Re-Evaluation  06/20/19    Authorization Type  6    Authorization Time Period  10    PT Start Time  1537   pr arrived late   PT Stop Time  1603    PT Time Calculation (min)  26 min    Activity Tolerance  Patient tolerated treatment well    Behavior During Therapy  [facility] for tasks assessed/performed       Past Medical History:  Diagnosis Date  . Anxiety   . Aortic aneurysm without rupture ([place])   . CAD (coronary artery disease)   . Chronic hip pain   . Endometriosis   . Hearing loss   . HTN, goal below 150/90   . Hyperlipidemia LDL goal <70 11/18/2014  . Osteopenia of the elderly   . Urge and stress incontinence     Past Surgical History:  Procedure Laterality Date  . ABDOMINAL HYSTERECTOMY  1976   total  . APPENDECTOMY    . BREAST EXCISIONAL BIOPSY Left 1987   benign    There were no vitals filed for this visit.  Subjective Assessment - 05/27/19 1539    Subjective  Pt states being late because she took a nap. Knees and feet are not at their best. 3/10 today.  The shift in the weather makes a difference. Feels bilateral plantar foot pain at the arches. The manual therapy for her thighs help.    Pertinent History  B knee pain since a year or more. Walking up the steps bothers her. Walking around regularly is no pain. Pt lives in a 2 story home, first floor set up. 14 steps to get to second floor, L banister rail starting a third of the way up.  No steps to enter front and back door.  Gradual onset of B knee pain. Has been to Dr. Marry Guan who  did x-rays who said that there is still cartilage in both knees. Was diagnosed with osteoarthritis.  [place]. Walking up hills bothers her knees.  Pt also walks about 20 minutes a day walking her dog.  R knee feels fragile when she starts with it to step up a step therefore she starts with her L, then she can step up with her R LE. Also goes to a chiropracter for adjustments.  Pt also has 5 step to go down to her meditation room, R rail assist in her home.  Has not yet used ice for her knees.   Pt adds that she has B foot pronation and plantar arch pain in which her orthotics that she wears helps her a lot.     Currently in Pain?  Yes    Pain Score  3    Stiffness                              PT Education - 05/27/19 1542    Education Details  ther-ex    Person(s) Educated  Patient    Methods  Explanation;Demonstration;Tactile cues;Verbal cues    Comprehension  Returned demonstration;Verbalized understanding      Objective  MedbridgeAccess Code: [email or web address]   No latex band allergies Blood pressure is controlled per pt.  Aortic aneurism osteopenia  Pt demonstrates B foot pronation. Pt states wearing orthotics  Pt states that she got a letter from Dr. Sanda Klein stating that she is leaving the practice due to health issues. Pt was recommended to see their nurse practitioner [name]   Pt states that getting up from the couch is hard for her.     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     Response to treatment Good muscle use felt with exercises.No B knee or foot pain after session  Clinical impression Continued working on decreasing lateral tissue tension B knees, improving glute max and medial hamstring strengthening to promote proper mechanics at her knee joints as well as to help decrease B foot pronation to decrease pressure to B plantar feet. No pain after session reported by pt. Pt will benefit from continued skilled physical therapy services to decrease pain, improve strength and function.    PT Short Term Goals - 09/20/18 1905      PT SHORT TERM GOAL #1   Title  Patient will be independent with her HEP to decrease pain, improve strength and function.     Time  3    Period  Weeks    Status  On-going    Target Date  10/11/18        PT Long Term Goals - 05/08/19 1051      PT LONG TERM GOAL #1   Title  Patient will have a decrease in B knee pain to 2/10 or less at worst to decrease difficulty negotiating stairs or climbing up hills.     Baseline  R knee 5/10 , L knee 4.5/10 at worst for the past 3 months (07/16/2018); 6/10 R knee pain at most, 4.5/10 L knee pain at most for the past 7 days (09/20/2018); 3/10 B knee pain at worst for the past 7 days (09/27/2018); 3/10 B knee pain at worst for the past 2 weeks (12/25/2018). 1.5/10 R knee pain (except with stairs), 1.5/10 L knee pain at most for the past 7 days except with steps/stairs (01/22/2019); 3.5/10 for both knees at most for the past 7 days going up and down steps. 2/10 at most B knees without the stairs (02/07/2019); 2.5/10 B knee pain at most for the past 7 days  including stairs (03/12/2019); 3.5/10 B knee pain at most for the past 3 weeks (05/08/2019)  Time  6    Period  Weeks    Status  Partially Met    Target Date  06/20/19      PT LONG TERM GOAL #2   Title  Patient will be able to ascend and descend 4 regular steps with bilateral UE assist at least 2 x with minimal to no complain of B knee pain.     Baseline  Increased B knee pain with ascending and descending 4 regular steps with B UE assist (07/16/2018). Still has pain but feels slight improvement (09/20/2018); increased B knee pain with stairs (12/25/2018), (01/22/2019); Minimal to no complain of B knee pain (03/12/2019)    Time  6    Period  Weeks    Status  Achieved      PT LONG TERM GOAL #3   Title  Patient will improve B hip extension and abduction strengh by at least 1/2 MMT to promote ability to negotiate stairs and climb hills with less pain.     Time  6    Period  Weeks    Status  Partially Met    Target Date  06/20/19      PT LONG TERM GOAL #4   Title  Patient will improve her 6 minute walk distance by at least 100 ft to promote mobility.     Baseline  1045 ft with L lateral hip pain/discomfort (07/16/2018); 1082 ft (09/20/2018); 910 ft (12/25/2018); 1008 ft, no pain or discomfort (01/22/2019); 1063 ft (02/07/2019); 985 ft  (03/12/19); 1185 ft (05/08/2019)    Time  6    Period  Weeks    Status  Achieved      PT LONG TERM GOAL #5   Title  Patient will have a decrease in R and L foot pain to 2/10 or less at worst to promote ability to ambulate with less difficulty.     Baseline  5/10 R foot pain, 3/10 L foot pain at most for the past 2 weeks (12/25/2018); 3.5/10 R foot and L foot pain at most for the past 7 days (01/22/2019); 2.5/10 B foot pain at most for the past 7 days (02/07/2019); 4/10 at most R foot for the past 7 days (03/12/2019); 4/10 B foot pain at most for the past 3 weeks (05/08/2019)    Time  6    Period  Weeks    Status  Partially Met    Target Date  06/20/19            Plan -  05/27/19 1543    Clinical Impression Statement  Continued working on decreasing lateral tissue tension B knees, improving glute max and medial hamstring strengthening to promote proper mechanics at her knee joints as well as to help decrease B foot pronation to decrease pressure to B plantar feet. No pain after session reported by pt. Pt will benefit from continued skilled physical therapy services to decrease pain, improve strength and function.    Personal Factors and Comorbidities  Age;Comorbidity 2;Fitness;Time since onset of injury/illness/exacerbation    Comorbidities  Osteopenia, aortic anneurism    Examination-Activity Limitations  Stairs    Stability/Clinical Decision Making  Stable/Uncomplicated    Rehab Potential  Fair    Clinical Impairments Affecting Rehab Potential  age, work schedule    PT Frequency  2x / week    PT Duration  6 weeks    PT Treatment/Interventions  Aquatic Therapy;Electrical Stimulation;Iontophoresis 50m/ml Dexamethasone;Gait training;Stair training;Therapeutic activities;Therapeutic exercise;Balance training;Neuromuscular re-education;Patient/family education;Manual techniques;Dry needling  PT Next Visit Plan  hip strengthening, femoral control, manual techniques, modalities PRN    PT Home Exercise Plan  Medbridge Access code [phone]    Consulted and Agree with Plan of Care  Patient       Patient will benefit from skilled therapeutic intervention in order to improve the following deficits and impairments:  Pain, Postural dysfunction, Improper body mechanics, Decreased strength, Decreased range of motion  Visit Diagnosis: Difficulty in walking, not elsewhere classified  Chronic pain of right knee  Chronic pain of left knee  Pain in left foot  Muscle weakness (generalized)  Pain in right foot     Problem List Patient Active Problem List   Diagnosis Date Noted  . Arthritis of knee, degenerative 05/22/2018  . Cataract, right eye 05/17/2018  .  Aortic stenosis, mild 02/22/2018  . Obesity (BMI 30.0-34.9) 01/18/2018  . Hip pain, chronic 04/02/2015  . Hearing loss of both ears 04/02/2015  . Osteopenia of the elderly 04/02/2015  . Hormone replacement therapy (postmenopausal) 04/02/2015  . Aortic aneurysm without rupture ([place]) 04/02/2015  . Situational anxiety 04/02/2015  . Hyperlipidemia LDL goal <70 11/18/2014  . Mixed stress and urge urinary incontinence 06/29/2014  . Hypertension goal BP (blood pressure) < 140/90 11/06/2013  . CAD (coronary artery disease) 11/06/2013    Joneen Boers PT, DPT   05/27/2019, 6:05 PM  [facility] [address] [place], [address] Phone: [phone]   Fax:  [phone]  Name: Angel [name] MRN: [id number] Date of Birth: May 27, 1931

## 2019-05-29 ENCOUNTER — Other Ambulatory Visit: Payer: Self-pay

## 2019-05-29 ENCOUNTER — Ambulatory Visit: Payer: Medicare Other

## 2019-05-29 DIAGNOSIS — M79672 Pain in left foot: Secondary | ICD-10-CM

## 2019-05-29 DIAGNOSIS — M25561 Pain in right knee: Secondary | ICD-10-CM | POA: Diagnosis not present

## 2019-05-29 DIAGNOSIS — M25562 Pain in left knee: Secondary | ICD-10-CM

## 2019-05-29 DIAGNOSIS — R262 Difficulty in walking, not elsewhere classified: Secondary | ICD-10-CM

## 2019-05-29 DIAGNOSIS — G8929 Other chronic pain: Secondary | ICD-10-CM

## 2019-05-29 DIAGNOSIS — M6281 Muscle weakness (generalized): Secondary | ICD-10-CM

## 2019-05-29 DIAGNOSIS — M79671 Pain in right foot: Secondary | ICD-10-CM

## 2019-05-29 NOTE — Therapy (Signed)
[facility] [address] [place], [address] Phone: [phone]   Fax:  [phone]  Physical Therapy Treatment  Patient Details  Name: Angel [name] MRN: [id number] Date of Birth: 1931/07/01 Referring Provider (PT): Delsa Grana, [place]   Encounter Date: 05/29/2019  PT End of Session - 05/29/19 1404    Visit Number  27    Number of Visits  61    Date for PT Re-Evaluation  06/20/19    Authorization Type  7    Authorization Time Period  10    PT Start Time  1404   pt arrived late   PT Stop Time  1428    PT Time Calculation (min)  24 min    Activity Tolerance  Patient tolerated treatment well    Behavior During Therapy  [facility] for tasks assessed/performed       Past Medical History:  Diagnosis Date  . Anxiety   . Aortic aneurysm without rupture ([place])   . CAD (coronary artery disease)   . Chronic hip pain   . Endometriosis   . Hearing loss   . HTN, goal below 150/90   . Hyperlipidemia LDL goal <70 11/18/2014  . Osteopenia of the elderly   . Urge and stress incontinence     Past Surgical History:  Procedure Laterality Date  . ABDOMINAL HYSTERECTOMY  1976   total  . APPENDECTOMY    . BREAST EXCISIONAL BIOPSY Left 1987   benign    There were no vitals filed for this visit.  Subjective Assessment - 05/29/19 1419    Subjective  Knees and feet are not bothering her today. Only a "friendly discomfort."    Pertinent History  B knee pain since a year or more. Walking up the steps bothers her. Walking around regularly is no pain. Pt lives in a 2 story home, first floor set up. 14 steps to get to second floor, L banister rail starting a third of the way up.  No steps to enter front and back door.  Gradual onset of B knee pain. Has been to Dr. Marry Guan who did x-rays who said that there is still cartilage in both knees. Was diagnosed with osteoarthritis.  [place]. Walking up hills bothers her knees.  Pt also walks about 20 minutes a day walking her dog.  R knee feels fragile when she starts with it to step up a step therefore she starts with her L, then she can step up with her R LE. Also goes to a chiropracter for adjustments.  Pt also has 5 step to go down to her meditation room, R rail assist in her home.  Has not yet used ice for her knees.   Pt adds that she has B foot pronation and plantar arch pain in which her orthotics that she wears helps her a lot.     Currently in Pain?  Other (Comment)   no complain of pain                              PT Education - 05/29/19 1427    Education Details  ther-ex    Person(s) Educated  Patient    Methods  Explanation;Demonstration;Tactile cues;Verbal cues    Comprehension  Verbalized understanding;Returned demonstration        Objective  MedbridgeAccess  Code: [email or web address]   No latex band allergies Blood pressure is controlled per pt.  Aortic aneurism osteopenia  Pt demonstrates B foot pronation. Pt states wearing orthotics  Pt states that she got a letter from Dr. Sanda Klein stating that she is leaving the practice due to health issues. Pt was recommended to see their nurse practitioner [name]   Pt states that getting up from the couch is hard for her.     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     Response to treatment Good muscle use felt with exercises.  Clinical impression Continued working on decreasing soft tissue tension B lateral knee and promoting glute and medial hamstrings muscle strengthening to promote better mechanics at her knee joints to help decrease pain with standing tasks such as walking and stair negotiation. Pt tolerated session well without aggravation of symptoms. Pt will benefit from continued skilled physical therapy services to decrease pain, improve strength and function.     PT Short Term Goals - 09/20/18 1905      PT SHORT TERM GOAL #1   Title  Patient will be independent with her HEP to decrease pain, improve strength and function.     Time  3    Period  Weeks    Status  On-going    Target Date  10/11/18        PT Long Term Goals - 05/08/19 1051      PT LONG TERM GOAL #1   Title  Patient will have a decrease in B knee pain to 2/10 or less at worst to decrease difficulty negotiating stairs or climbing up hills.     Baseline  R knee 5/10 , L knee 4.5/10 at worst for the past 3 months (07/16/2018); 6/10 R knee pain at most, 4.5/10 L knee pain at most for the past 7 days (09/20/2018); 3/10 B knee pain at worst for the past 7 days (09/27/2018); 3/10 B knee pain at worst for the past 2 weeks (12/25/2018). 1.5/10 R knee pain (except with stairs), 1.5/10 L knee pain at most for the past 7 days except with steps/stairs (01/22/2019); 3.5/10 for both knees at most for the past 7 days going up and down steps. 2/10 at most B knees without the stairs (02/07/2019); 2.5/10 B knee pain at most for the past 7 days including stairs (03/12/2019); 3.5/10 B knee pain at most for the past 3 weeks (05/08/2019)    Time  6    Period  Weeks    Status  Partially Met    Target Date  06/20/19       PT LONG TERM GOAL #2   Title  Patient will be able to ascend and descend 4 regular steps with bilateral UE assist at least 2 x with minimal to no complain of B knee  pain.     Baseline  Increased B knee pain with ascending and descending 4 regular steps with B UE assist (07/16/2018). Still has pain but feels slight improvement (09/20/2018); increased B knee pain with stairs (12/25/2018), (01/22/2019); Minimal to no complain of B knee pain (03/12/2019)    Time  6    Period  Weeks    Status  Achieved      PT LONG TERM GOAL #3   Title  Patient will improve B hip extension and abduction strengh by at least 1/2 MMT to promote ability to negotiate stairs and climb hills with less pain.     Time  6    Period  Weeks    Status  Partially Met    Target Date  06/20/19      PT LONG TERM GOAL #4   Title  Patient will improve her 6 minute walk distance by at least 100 ft to promote mobility.     Baseline  1045 ft with L lateral hip pain/discomfort (07/16/2018); 1082 ft (09/20/2018); 910 ft (12/25/2018); 1008 ft, no pain or discomfort (01/22/2019); 1063 ft (02/07/2019); 985 ft  (03/12/19); 1185 ft (05/08/2019)    Time  6    Period  Weeks    Status  Achieved      PT LONG TERM GOAL #5   Title  Patient will have a decrease in R and L foot pain to 2/10 or less at worst to promote ability to ambulate with less difficulty.     Baseline  5/10 R foot pain, 3/10 L foot pain at most for the past 2 weeks (12/25/2018); 3.5/10 R foot and L foot pain at most for the past 7 days (01/22/2019); 2.5/10 B foot pain at most for the past 7 days (02/07/2019); 4/10 at most R foot for the past 7 days (03/12/2019); 4/10 B foot pain at most for the past 3 weeks (05/08/2019)    Time  6    Period  Weeks    Status  Partially Met    Target Date  06/20/19            Plan - 05/29/19 1427    Clinical Impression Statement  Continued working on decreasing soft tissue tension B lateral knee and promoting glute and medial hamstrings muscle  strengthening to promote better mechanics at her knee joints to help decrease pain with standing tasks such as walking and stair negotiation. Pt tolerated session well without aggravation of symptoms. Pt will benefit from continued skilled physical therapy services to decrease pain, improve strength and function.    Personal Factors and Comorbidities  Age;Comorbidity 2;Fitness;Time since onset of injury/illness/exacerbation    Comorbidities  Osteopenia, aortic anneurism    Examination-Activity Limitations  Stairs    Stability/Clinical Decision Making  Stable/Uncomplicated    Rehab Potential  Fair    Clinical Impairments Affecting Rehab Potential  age, work schedule    PT Frequency  2x / week    PT Duration  6 weeks    PT Treatment/Interventions  Aquatic Therapy;Electrical Stimulation;Iontophoresis 45m/ml Dexamethasone;Gait training;Stair training;Therapeutic activities;Therapeutic exercise;Balance training;Neuromuscular re-education;Patient/family education;Manual techniques;Dry needling    PT Next Visit Plan  hip strengthening, femoral control, manual techniques, modalities PRN    PT Home Exercise Plan  Medbridge Access code [id number]   Consulted and Agree with Plan of Care  Patient       Patient will benefit from skilled therapeutic intervention in order to improve the following deficits and impairments:  Pain, Postural dysfunction,  Improper body mechanics, Decreased strength, Decreased range of motion  Visit Diagnosis: Difficulty in walking, not elsewhere classified  Chronic pain of right knee  Chronic pain of left knee  Pain in right foot  Muscle weakness (generalized)  Pain in left foot     Problem List Patient Active Problem List   Diagnosis Date Noted  . Arthritis of knee, degenerative 05/22/2018  . Cataract, right eye 05/17/2018  . Aortic stenosis, mild 02/22/2018  . Obesity (BMI 30.0-34.9) 01/18/2018  . Hip pain, chronic 04/02/2015  . Hearing loss of both ears  04/02/2015  . Osteopenia of the elderly 04/02/2015  . Hormone replacement therapy (postmenopausal) 04/02/2015  . Aortic aneurysm without rupture ([place]) 04/02/2015  . Situational anxiety 04/02/2015  . Hyperlipidemia LDL goal <70 11/18/2014  . Mixed stress and urge urinary incontinence 06/29/2014  . Hypertension goal BP (blood pressure) < 140/90 11/06/2013  . CAD (coronary artery disease) 11/06/2013    Joneen Boers PT, DPT   05/29/2019, 6:38 PM  [facility] [address], [place], [address] Phone: [phone]   Fax:  [phone]  Name: Angel [name] MRN: [id number] Date of Birth: 1931/03/23

## 2019-06-03 ENCOUNTER — Other Ambulatory Visit: Payer: Self-pay

## 2019-06-03 ENCOUNTER — Ambulatory Visit: Payer: Medicare Other

## 2019-06-03 DIAGNOSIS — M9902 Segmental and somatic dysfunction of thoracic region: Secondary | ICD-10-CM | POA: Diagnosis not present

## 2019-06-03 DIAGNOSIS — M25562 Pain in left knee: Secondary | ICD-10-CM

## 2019-06-03 DIAGNOSIS — G8929 Other chronic pain: Secondary | ICD-10-CM | POA: Diagnosis not present

## 2019-06-03 DIAGNOSIS — M5134 Other intervertebral disc degeneration, thoracic region: Secondary | ICD-10-CM | POA: Diagnosis not present

## 2019-06-03 DIAGNOSIS — M62830 Muscle spasm of back: Secondary | ICD-10-CM | POA: Diagnosis not present

## 2019-06-03 DIAGNOSIS — M5416 Radiculopathy, lumbar region: Secondary | ICD-10-CM | POA: Diagnosis not present

## 2019-06-03 DIAGNOSIS — M6281 Muscle weakness (generalized): Secondary | ICD-10-CM

## 2019-06-03 DIAGNOSIS — M79672 Pain in left foot: Secondary | ICD-10-CM | POA: Diagnosis not present

## 2019-06-03 DIAGNOSIS — R262 Difficulty in walking, not elsewhere classified: Secondary | ICD-10-CM | POA: Diagnosis not present

## 2019-06-03 DIAGNOSIS — M79671 Pain in right foot: Secondary | ICD-10-CM

## 2019-06-03 DIAGNOSIS — M25561 Pain in right knee: Secondary | ICD-10-CM | POA: Diagnosis not present

## 2019-06-03 NOTE — Therapy (Signed)
[name] [address], [place], [address] Phone: [phone]   Fax:  [phone]  Physical Therapy Treatment  Patient Details  Name: Angel [name] MRN: [id number] Date of Birth: [birth year] Referring Provider (PT): Delsa Grana, [place]   Encounter Date: 06/03/2019  PT End of Session - 06/03/19 1455    Visit Number  28    Number of Visits  61    Date for PT Re-Evaluation  06/20/19    Authorization Type  8    Authorization Time Period  10    PT Start Time  1455   pt arrived late   PT Stop Time  1523    PT Time Calculation (min)  28 min    Activity Tolerance  Patient tolerated treatment well    Behavior During Therapy  [facility] for tasks assessed/performed       Past Medical History:  Diagnosis Date  . Anxiety   . Aortic aneurysm without rupture ([place])   . CAD (coronary artery disease)   . Chronic hip pain   . Endometriosis   . Hearing loss   . HTN, goal below 150/90   . Hyperlipidemia LDL goal <70 11/18/2014  . Osteopenia of the elderly   . Urge and stress incontinence     Past Surgical History:  Procedure Laterality Date  . ABDOMINAL HYSTERECTOMY  1976   total  . APPENDECTOMY    . BREAST EXCISIONAL BIOPSY Left 1987   benign    There were no vitals filed for this visit.  Subjective Assessment - 06/03/19 1455    Subjective  Today is not a very good day. A good friend of hers is having surgery today and is having dementia. Feels sad about it. Both feet and knees have been bothering her today. 3.5/10 currently for everythning    Pertinent History  B knee pain since a year or more. Walking up the steps bothers her. Walking around regularly is no pain. Pt lives in a 2 story home, first floor set up. 14 steps to get to second floor, L banister rail starting a third of the way up.  No steps to enter front and back door.  Gradual onset of B knee pain. Has been to Dr. Marry Guan who did x-rays who said that  there is still cartilage in both knees. Was diagnosed with osteoarthritis.  [place]. Walking up hills bothers her knees.  Pt also walks about 20 minutes a day walking her dog.  R knee feels fragile when she starts with it to step up a step therefore she starts with her L, then she can step up with her R LE. Also goes to a chiropracter for adjustments.  Pt also has 5 step to go down to her meditation room, R rail assist in her home.  Has not yet used ice for her knees.   Pt adds that she has B foot pronation and plantar arch pain in which her orthotics that she wears helps her a lot.     Currently in Pain?  Yes    Pain Score  4    3.5/10                              PT Education - 06/03/19 1517    Education Details  ther-ex    Person(s)  Educated  Patient    Methods  Explanation;Demonstration;Tactile cues;Verbal cues    Comprehension  Returned demonstration;Verbalized understanding       Objective  MedbridgeAccess Code: [email or web address]   No latex band allergies Blood pressure is controlled per pt.  Aortic aneurism osteopenia  Pt demonstrates B foot pronation. Pt states wearing orthotics  Pt states that she got a letter from Dr. Sanda Klein stating that she is leaving the practice due to health issues. Pt was recommended to see their nurse practitioner [name]   Pt states that getting up from the couch is hard for her.     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     Response to treatment Good muscle use felt with exercises.  Clinical impression Pt arrived late so session adjusted accordingly. No pain after session in both knees and feet with gait. Continued working on decreasing lateral hamstrings, IT band, vastus lateralis tension as well as continued working on glute max and medial hamstrings strengthening to promote better mechanics at her knees and ankles during closed chain activities such as ambulation. Pt will benefit from continued skilled physical therapy services to decrease pain, improve strength, and function.     PT Short Term Goals - 09/20/18 1905      PT SHORT TERM GOAL #1   Title  Patient will be independent with her HEP to decrease pain, improve strength and function.     Time  3    Period  Weeks    Status  On-going    Target Date  10/11/18        PT Long Term Goals - 05/08/19 1051      PT LONG TERM GOAL #1   Title  Patient will have a decrease in B knee pain to 2/10 or less at worst to decrease difficulty negotiating stairs or climbing up hills.     Baseline  R knee 5/10 , L knee 4.5/10 at worst for the past 3 months (07/16/2018); 6/10 R knee pain at most, 4.5/10 L knee pain at most for the past 7 days (09/20/2018); 3/10 B knee pain at worst for the past 7 days (09/27/2018); 3/10 B knee pain at worst for the past 2 weeks (12/25/2018). 1.5/10 R knee pain (except with stairs), 1.5/10 L knee pain at most for the past 7 days except with steps/stairs (01/22/2019); 3.5/10 for both knees at most for the past 7 days going up and down steps. 2/10 at most B knees without the stairs (02/07/2019); 2.5/10 B knee pain at most for the past 7 days including stairs (03/12/2019); 3.5/10 B knee pain at most for the past 3 weeks (05/08/2019)    Time  6    Period  Weeks    Status  Partially Met    Target  Date  06/20/19      PT LONG TERM GOAL #2   Title  Patient will be able to ascend and descend  4 regular steps with bilateral UE assist at least 2 x with minimal to no complain of B knee pain.     Baseline  Increased B knee pain with ascending and descending 4 regular steps with B UE assist (07/16/2018). Still has pain but feels slight improvement (09/20/2018); increased B knee pain with stairs (12/25/2018), (01/22/2019); Minimal to no complain of B knee pain (03/12/2019)    Time  6    Period  Weeks    Status  Achieved      PT LONG TERM GOAL #3   Title  Patient will improve B hip extension and abduction strengh by at least 1/2 MMT to promote ability to negotiate stairs and climb hills with less pain.     Time  6    Period  Weeks    Status  Partially Met    Target Date  06/20/19      PT LONG TERM GOAL #4   Title  Patient will improve her 6 minute walk distance by at least 100 ft to promote mobility.     Baseline  1045 ft with L lateral hip pain/discomfort (07/16/2018); 1082 ft (09/20/2018); 910 ft (12/25/2018); 1008 ft, no pain or discomfort (01/22/2019); 1063 ft (02/07/2019); 985 ft  (03/12/19); 1185 ft (05/08/2019)    Time  6    Period  Weeks    Status  Achieved      PT LONG TERM GOAL #5   Title  Patient will have a decrease in R and L foot pain to 2/10 or less at worst to promote ability to ambulate with less difficulty.     Baseline  5/10 R foot pain, 3/10 L foot pain at most for the past 2 weeks (12/25/2018); 3.5/10 R foot and L foot pain at most for the past 7 days (01/22/2019); 2.5/10 B foot pain at most for the past 7 days (02/07/2019); 4/10 at most R foot for the past 7 days (03/12/2019); 4/10 B foot pain at most for the past 3 weeks (05/08/2019)    Time  6    Period  Weeks    Status  Partially Met    Target Date  06/20/19            Plan - 06/03/19 1455    Clinical Impression Statement  Pt arrived late so session adjusted accordingly. No pain after session in both knees and feet with gait.  Continued working on decreasing lateral hamstrings, IT band, vastus lateralis tension as well as continued working on glute max and medial hamstrings strengthening to promote better mechanics at her knees and ankles during closed chain activities such as ambulation. Pt will benefit from continued skilled physical therapy services to decrease pain, improve strength, and function.    Personal Factors and Comorbidities  Age;Comorbidity 2;Fitness;Time since onset of injury/illness/exacerbation    Comorbidities  Osteopenia, aortic anneurism    Examination-Activity Limitations  Stairs    Stability/Clinical Decision Making  Stable/Uncomplicated    Rehab Potential  Fair    Clinical Impairments Affecting Rehab Potential  age, work schedule    PT Frequency  2x / week    PT Duration  6 weeks    PT Treatment/Interventions  Aquatic Therapy;Electrical Stimulation;Iontophoresis 54m/ml Dexamethasone;Gait training;Stair training;Therapeutic activities;Therapeutic exercise;Balance training;Neuromuscular re-education;Patient/family education;Manual techniques;Dry needling    PT Next Visit Plan  hip strengthening, femoral control, manual techniques, modalities PRN    PT Home Exercise Plan  [place] code [phone]   Consulted and Agree with Plan of  Care  Patient       Patient will benefit from skilled therapeutic intervention in order to improve the following deficits and impairments:  Pain, Postural dysfunction, Improper body mechanics, Decreased strength, Decreased range of motion  Visit Diagnosis: Difficulty in walking, not elsewhere classified  Chronic pain of left knee  Pain in right foot  Chronic pain of right knee  Muscle weakness (generalized)  Pain in left foot     Problem List Patient Active Problem List   Diagnosis Date Noted  . Arthritis of knee, degenerative 05/22/2018  . Cataract, right eye 05/17/2018  . Aortic stenosis, mild 02/22/2018  . Obesity (BMI 30.0-34.9) 01/18/2018   . Hip pain, chronic 04/02/2015  . Hearing loss of both ears 04/02/2015  . Osteopenia of the elderly 04/02/2015  . Hormone replacement therapy (postmenopausal) 04/02/2015  . Aortic aneurysm without rupture ([place]) 04/02/2015  . Situational anxiety 04/02/2015  . Hyperlipidemia LDL goal <70 11/18/2014  . Mixed stress and urge urinary incontinence 06/29/2014  . Hypertension goal BP (blood pressure) < 140/90 11/06/2013  . CAD (coronary artery disease) 11/06/2013    Joneen Boers PT, DPT   06/03/2019, 3:41 PM  [facility] [address] [place], [address] Phone: [phone]   Fax:  [phone]  Name: Angel [name] MRN: [id number] Date of Birth: 1930/11/10

## 2019-06-05 ENCOUNTER — Other Ambulatory Visit: Payer: Self-pay

## 2019-06-05 ENCOUNTER — Ambulatory Visit: Payer: Medicare Other

## 2019-06-05 DIAGNOSIS — G8929 Other chronic pain: Secondary | ICD-10-CM

## 2019-06-05 DIAGNOSIS — R262 Difficulty in walking, not elsewhere classified: Secondary | ICD-10-CM | POA: Diagnosis not present

## 2019-06-05 DIAGNOSIS — M79671 Pain in right foot: Secondary | ICD-10-CM

## 2019-06-05 DIAGNOSIS — M6281 Muscle weakness (generalized): Secondary | ICD-10-CM

## 2019-06-05 DIAGNOSIS — M25561 Pain in right knee: Secondary | ICD-10-CM | POA: Diagnosis not present

## 2019-06-05 DIAGNOSIS — M25562 Pain in left knee: Secondary | ICD-10-CM | POA: Diagnosis not present

## 2019-06-05 DIAGNOSIS — M79672 Pain in left foot: Secondary | ICD-10-CM | POA: Diagnosis not present

## 2019-06-05 NOTE — Therapy (Signed)
[facility] [address] [place], [address] Phone: [phone]   Fax:  [phone]  Physical Therapy Treatment  Patient Details  Name: Angel [name] MRN: [id number] Date of Birth: 1931/07/15 Referring Provider (PT): Delsa Grana, [place]   Encounter Date: 06/05/2019  PT End of Session - 06/05/19 1402    Visit Number  29    Number of Visits  61    Date for PT Re-Evaluation  06/20/19    Authorization Type  9    Authorization Time Period  10    PT Start Time  3570   pt arrived late   PT Stop Time  1430    PT Time Calculation (min)  28 min    Activity Tolerance  Patient tolerated treatment well    Behavior During Therapy  [facility] for tasks assessed/performed       Past Medical History:  Diagnosis Date  . Anxiety   . Aortic aneurysm without rupture ([place])   . CAD (coronary artery disease)   . Chronic hip pain   . Endometriosis   . Hearing loss   . HTN, goal below 150/90   . Hyperlipidemia LDL goal <70 11/18/2014  . Osteopenia of the elderly   . Urge and stress incontinence     Past Surgical History:  Procedure Laterality Date  . ABDOMINAL HYSTERECTOMY  1976   total  . APPENDECTOMY    . BREAST EXCISIONAL BIOPSY Left 1987   benign    There were no vitals filed for this visit.  Subjective Assessment - 06/05/19 1402    Subjective  Knees and ankles are not bothering her today.    Pertinent History  B knee pain since a year or more. Walking up the steps bothers her. Walking around regularly is no pain. Pt lives in a 2 story home, first floor set up. 14 steps to get to second floor, L banister rail starting a third of the way up.  No steps to enter front and back door.  Gradual onset of B knee pain. Has been to Dr. Marry Guan who did x-rays who said that there is still cartilage in both knees. Was diagnosed with osteoarthritis.  [place]. Walking up  hills bothers her knees.  Pt also walks about 20 minutes a day walking her dog.  R knee feels fragile when she starts with it to step up a step therefore she starts with her L, then she can step up with her R LE. Also goes to a chiropracter for adjustments.  Pt also has 5 step to go down to her meditation room, R rail assist in her home.  Has not yet used ice for her knees.   Pt adds that she has B foot pronation and plantar arch pain in which her orthotics that she wears helps her a lot.     Currently in Pain?  No/denies    Pain Score  0-No pain                               PT Education - 06/05/19 1408    Education Details  ther-ex    Person(s) Educated  Patient    Methods  Explanation;Demonstration;Tactile cues;Verbal cues    Comprehension  Returned demonstration;Verbalized understanding      Objective  MedbridgeAccess Code: [email or web address]  No latex band allergies Blood pressure is controlled per pt.  Aortic aneurism osteopenia  Pt demonstrates B foot pronation. Pt states wearing orthotics  Pt states that she got a letter from Dr. Sanda Klein stating that she is leaving the practice due to health issues. Pt was recommended to see their nurse practitioner [name]   Pt states that getting up from the couch is hard for her.     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     Response to treatment Good muscle use felt with exercises.Pt tolerated session well without aggravation of symptoms.   Clinical impression No pain in knees and ankles after session. Continued working on improving glute and closed chain LE strength as well as decreasing lateral tension to both knees to continue promoting better LE mechanics. Pt will benefit from continued skilled physical therapy services to decrease pain, improve strength and function.    PT Short Term Goals - 09/20/18 1905      PT SHORT TERM GOAL #1   Title  Patient will be independent with her HEP to decrease pain, improve strength and function.     Time  3    Period  Weeks    Status  On-going    Target Date  10/11/18        PT Long Term Goals - 05/08/19 1051      PT LONG TERM GOAL #1   Title  Patient will have a decrease in B knee pain to 2/10 or less at worst to decrease difficulty negotiating stairs or climbing up hills.     Baseline  R knee 5/10 , L knee 4.5/10 at worst for the past 3 months (07/16/2018); 6/10 R knee pain at most, 4.5/10 L knee pain at most for the past 7 days (09/20/2018); 3/10 B knee pain at worst for the past 7 days (09/27/2018); 3/10 B knee pain at worst for the past 2 weeks (12/25/2018). 1.5/10 R knee pain (except with stairs), 1.5/10 L knee pain at most for the past 7 days except with steps/stairs (01/22/2019); 3.5/10 for both knees at most for the past 7 days going up and down steps. 2/10 at most B knees without the stairs (02/07/2019); 2.5/10 B knee pain at most for the past 7 days including stairs (03/12/2019); 3.5/10 B knee pain at most for the past 3 weeks (05/08/2019)    Time  6    Period  Weeks    Status  Partially Met    Target Date  06/20/19      PT LONG TERM GOAL #2   Title  Patient will be able to ascend and descend 4 regular steps with bilateral UE assist at least 2 x with minimal to no complain of B knee pain.     Baseline  Increased B knee pain with  ascending and descending 4 regular steps with B UE assist (07/16/2018). Still has pain but feels slight improvement (09/20/2018); increased B knee pain with stairs (12/25/2018), (01/22/2019); Minimal to no complain of B knee pain (03/12/2019)    Time  6    Period  Weeks    Status  Achieved      PT LONG TERM GOAL #3   Title  Patient will improve B hip extension and abduction strengh by at least 1/2 MMT to promote ability to negotiate stairs and climb hills with less pain.     Time  6    Period  Weeks    Status  Partially Met    Target Date  06/20/19      PT LONG TERM GOAL #4   Title  Patient will improve her 6 minute walk distance by at least 100 ft to promote mobility.     Baseline  1045 ft with L lateral hip pain/discomfort (07/16/2018); 1082 ft (09/20/2018); 910 ft (12/25/2018); 1008 ft, no pain or discomfort (01/22/2019); 1063 ft (02/07/2019); 985 ft  (03/12/19); 1185 ft (05/08/2019)    Time  6    Period  Weeks    Status  Achieved      PT LONG TERM GOAL #5   Title  Patient will have a decrease in R and L foot pain to 2/10 or less at worst to promote ability to ambulate with less difficulty.     Baseline  5/10 R foot pain, 3/10 L foot pain at most for the past 2 weeks (12/25/2018); 3.5/10 R foot and L foot pain at most for the past 7 days (01/22/2019); 2.5/10 B foot pain at most for the past 7 days (02/07/2019); 4/10 at most R foot for the past 7 days (03/12/2019); 4/10 B foot pain at most for the past 3 weeks (05/08/2019)    Time  6    Period  Weeks    Status  Partially Met    Target Date  06/20/19            Plan - 06/05/19 1408    Clinical Impression Statement  No pain in knees and ankles after session. Continued working on improving glute and closed chain LE strength as well as decreasing lateral tension to both knees to continue promoting better LE mechanics. Pt will benefit from continued skilled physical therapy services to decrease pain, improve strength and function.    Personal Factors and  Comorbidities  Age;Comorbidity 2;Fitness;Time since onset of injury/illness/exacerbation    Comorbidities  Osteopenia, aortic anneurism    Examination-Activity Limitations  Stairs    Stability/Clinical Decision Making  Stable/Uncomplicated    Rehab Potential  Fair    Clinical Impairments Affecting Rehab Potential  age, work schedule    PT Frequency  2x / week    PT Duration  6 weeks    PT Treatment/Interventions  Aquatic Therapy;Electrical Stimulation;Iontophoresis 26m/ml Dexamethasone;Gait training;Stair training;Therapeutic activities;Therapeutic exercise;Balance training;Neuromuscular re-education;Patient/family education;Manual techniques;Dry needling    PT Next Visit Plan  hip strengthening, femoral control, manual techniques, modalities PRN    PT Home Exercise Plan  MLa CrosseAccess code [phone]   Consulted and Agree with Plan of  Care  Patient       Patient will benefit from skilled therapeutic intervention in order to improve the following deficits and impairments:  Pain, Postural dysfunction, Improper body mechanics, Decreased strength, Decreased range of motion  Visit Diagnosis: Difficulty in walking, not elsewhere classified  Chronic pain of left knee  Pain in right foot  Chronic pain of right knee  Muscle weakness (generalized)  Pain in left foot     Problem List Patient Active Problem List   Diagnosis Date Noted  . Arthritis of knee, degenerative 05/22/2018  . Cataract, right eye 05/17/2018  . Aortic stenosis, mild 02/22/2018  . Obesity (BMI 30.0-34.9) 01/18/2018  . Hip pain, chronic 04/02/2015  . Hearing loss of both ears 04/02/2015  . Osteopenia of the elderly 04/02/2015  . Hormone replacement therapy (postmenopausal) 04/02/2015  . Aortic aneurysm without rupture ([place]) 04/02/2015  . Situational anxiety 04/02/2015  . Hyperlipidemia LDL goal <70 11/18/2014  . Mixed stress and urge urinary incontinence 06/29/2014  . Hypertension goal BP (blood pressure) <  140/90 11/06/2013  . CAD (coronary artery disease) 11/06/2013    Joneen Boers PT, DPT   06/05/2019, 8:54 PM  [facility] [address] [place], [address] Phone: [phone]   Fax:  [phone]  Name: MESHELLE HOLNESS MRN: [id number] Date of Birth: 1931/03/17

## 2019-06-10 ENCOUNTER — Ambulatory Visit: Payer: Medicare Other

## 2019-06-12 ENCOUNTER — Ambulatory Visit: Payer: Medicare Other | Attending: Family Medicine

## 2019-06-12 ENCOUNTER — Other Ambulatory Visit: Payer: Self-pay

## 2019-06-12 DIAGNOSIS — M6281 Muscle weakness (generalized): Secondary | ICD-10-CM | POA: Diagnosis not present

## 2019-06-12 DIAGNOSIS — G8929 Other chronic pain: Secondary | ICD-10-CM | POA: Diagnosis not present

## 2019-06-12 DIAGNOSIS — M25561 Pain in right knee: Secondary | ICD-10-CM | POA: Insufficient documentation

## 2019-06-12 DIAGNOSIS — M25562 Pain in left knee: Secondary | ICD-10-CM | POA: Insufficient documentation

## 2019-06-12 DIAGNOSIS — M79672 Pain in left foot: Secondary | ICD-10-CM | POA: Diagnosis not present

## 2019-06-12 DIAGNOSIS — M79671 Pain in right foot: Secondary | ICD-10-CM

## 2019-06-12 DIAGNOSIS — R262 Difficulty in walking, not elsewhere classified: Secondary | ICD-10-CM | POA: Diagnosis not present

## 2019-06-12 NOTE — Therapy (Signed)
[facility] [address] [place], [address] Phone: [phone]   Fax:  [phone]  Physical Therapy Treatment And Progress Report (03/14/2019 - 06/12/2019)  Patient Details  Name: Angel [name] MRN: [id number] Date of Birth: 1931-07-27 Referring Provider (PT): Delsa Grana, PA-C   Encounter Date: 06/12/2019  PT End of Session - 06/12/19 0955    Visit Number  10    Number of Visits  74    Date for PT Re-Evaluation  07/25/19    Authorization Type  10    Authorization Time Period  10    PT Start Time  [phone]   pt arrived late   PT Stop Time  1029    PT Time Calculation (min)  34 min    Activity Tolerance  Patient tolerated treatment well    Behavior During Therapy  [facility] for tasks assessed/performed       Past Medical History:  Diagnosis Date  . Anxiety   . Aortic aneurysm without rupture ([place])   . CAD (coronary artery disease)   . Chronic hip pain   . Endometriosis   . Hearing loss   . HTN, goal below 150/90   . Hyperlipidemia LDL goal <70 11/18/2014  . Osteopenia of the elderly   . Urge and stress incontinence     Past Surgical History:  Procedure Laterality Date  . ABDOMINAL HYSTERECTOMY  1976   total  . APPENDECTOMY    . BREAST EXCISIONAL BIOPSY Left 1987   benign    There were no vitals filed for this visit.  Subjective Assessment - 06/12/19 0957    Subjective  Everything seems to be working this morning. No pain.    Pertinent History  B knee pain since a year or more. Walking up the steps bothers her. Walking around regularly is no pain. Pt lives in a 2 story home, first floor set up. 14 steps to get to second floor, L banister rail starting a third of the way up.  No steps to enter front and back door.  Gradual onset of B knee pain. Has been to Dr. Marry Guan who did x-rays who said that there is still cartilage in both knees. Was diagnosed with osteoarthritis.  [place]. Walking up hills bothers her knees.  Pt also walks about 20 minutes a day walking her dog.  R knee feels fragile when she starts with it to step up a step therefore she starts with her L, then she can step up with her R LE. Also goes to a chiropracter for adjustments.  Pt also has 5 step to go down to her meditation room, R rail assist in her home.  Has not yet used ice for her knees.   Pt adds that she has B foot pronation and plantar arch pain in which her orthotics that she wears helps her a lot.     Currently in Pain?  No/denies                               PT Education - 06/12/19 1237    Education Details  ther-ex, add another hand rail at stairs to decrease stress to knees    Person(s) Educated  Patient    Methods  Explanation;Demonstration;Tactile cues;Verbal cues    Comprehension  Returned demonstration;Verbalized understanding  Objective  MedbridgeAccess Code: [email or web address]   No latex band allergies Blood pressure is controlled per pt.  Aortic aneurism osteopenia  Pt demonstrates B foot pronation. Pt states wearing orthotics  Pt states that she got a letter from Dr. Sanda Klein stating that she is leaving the practice due to health issues. Pt was recommended to see their nurse practitioner [name]   Pt states that getting up from the couch is hard for her.     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 Pt states that her steps are about 7 inches deep.     Therapeutic exercise  Reviewed progress/current status with knee and foot pain with pt.   Ascending and decending 4 regular steps with B UE assist 2x. Minimal to no pain  Then with L rail assist 2x (medium level knee discomfort)  (6 inch steps)    Forward step up onto and over 8 inch step with contralateral  UE assist   R 2x  L 2x   Then 6 inch step with contralateral UE assist    R 2x   L 2x    Then 7 inch step with contralateral UE assist. Pt states height is similar to her step    R 5x    L 5x    1.5/10 level of discomfort  Forward step up onto 7 inch step with L rail assist   R 6x (minimal discomfort)   L 2x. Increased discomfort    Forward step up onto 7 inch step with L LE and R UE assist. 3x. Minimal discomfort.     Pt was recommended to install another rail at her stairs if possible to decrease stress to her kness. Pt verbalized understanding.    Improved exercise technique, movement at target joints, use of target muscles after mod verbal, visual, tactile cues.     Response to treatment Good muscle use felt with exercises.Pt tolerated session well without aggravation of symptoms.   Clinical impression Significant overall level of improvement in B knee pain since initial evaluatino. Pt also able to negotiate stairs with minimal knee pain with contralateral UE assist in the clinic with B UE assist. Pt however, only has 1 rail at home which does not provide enough support for her knee with stair negotiation. Pt was recommended to install another rail for her stairs to decrease level of discomfort negotiating them at home. Pt verbalized understanding. Pt tolerated session well without aggravation of symptoms. Pt will benefit from continued skilled physical therapy services to decrease pain, improve strength, function, and decrease difficulty with stair negotiation.        PT Short Term Goals - 09/20/18 1905      PT SHORT TERM GOAL #1   Title  Patient will be independent with her HEP to decrease pain, improve strength and function.     Time  3    Period  Weeks    Status  On-going    Target Date  10/11/18        PT Long Term Goals - 06/12/19 0958      PT LONG TERM GOAL #1   Title  Patient will have a decrease in B knee pain to 2/10 or less at worst to decrease  difficulty negotiating stairs or climbing up hills.     Baseline  R knee  5/10 , L knee 4.5/10 at worst for the past 3 months (07/16/2018); 6/10 R knee pain at most, 4.5/10 L knee pain at most for the past 7 days (09/20/2018); 3/10 B knee pain at worst for the past 7 days (09/27/2018); 3/10 B knee pain at worst for the past 2 weeks (12/25/2018). 1.5/10 R knee pain (except with stairs), 1.5/10 L knee pain at most for the past 7 days except with steps/stairs (01/22/2019); 3.5/10 for both knees at most for the past 7 days going up and down steps. 2/10 at most B knees without the stairs (02/07/2019); 2.5/10 B knee pain at most for the past 7 days including stairs (03/12/2019); 3.5/10 B knee pain at most for the past 3 weeks (05/08/2019);  2/10 B knee pain at most for the past 7 days (06/12/2019)    Time  6    Period  Weeks    Status  Partially Met    Target Date  07/25/19      PT LONG TERM GOAL #2   Title  Patient will be able to ascend and descend 4 regular steps with bilateral UE assist at least 2 x with minimal to no complain of B knee pain.     Baseline  Increased B knee pain with ascending and descending 4 regular steps with B UE assist (07/16/2018). Still has pain but feels slight improvement (09/20/2018); increased B knee pain with stairs (12/25/2018), (01/22/2019); Minimal to no complain of B knee pain (03/12/2019), minimal pain but her steps at home are higher per pt (06/12/2019)    Time  6    Period  Weeks    Status  Partially Met    Target Date  07/25/19      PT LONG TERM GOAL #3   Title  Patient will improve B hip extension and abduction strengh by at least 1/2 MMT to promote ability to negotiate stairs and climb hills with less pain.     Time  6    Period  Weeks    Status  Partially Met    Target Date  07/25/19      PT LONG TERM GOAL #4   Title  Patient will improve her 6 minute walk distance by at least 100 ft to promote mobility.     Baseline  1045 ft with L lateral hip pain/discomfort (07/16/2018);  1082 ft (09/20/2018); 910 ft (12/25/2018); 1008 ft, no pain or discomfort (01/22/2019); 1063 ft (02/07/2019); 985 ft  (03/12/19); 1185 ft (05/08/2019)    Time  6    Period  Weeks    Status  Achieved      PT LONG TERM GOAL #5   Title  Patient will have a decrease in R and L foot pain to 2/10 or less at worst to promote ability to ambulate with less difficulty.     Baseline  5/10 R foot pain, 3/10 L foot pain at most for the past 2 weeks (12/25/2018); 3.5/10 R foot and L foot pain at most for the past 7 days (01/22/2019); 2.5/10 B foot pain at most for the past 7 days (02/07/2019); 4/10 at most R foot for the past 7 days (03/12/2019); 4/10 B foot pain at most for the past 3 weeks (05/08/2019); 3/10 B foot pain (at the arch area) at most for the past 7 days (06/12/2019)    Time  6    Period  Weeks    Status  Partially Met    Target Date  07/25/19  Plan - 06/12/19 1240    Clinical Impression Statement  Significant overall level of improvement in B knee pain since initial evaluatino. Pt also able to negotiate stairs with minimal knee pain with contralateral UE assist in the clinic with B UE assist. Pt however, only has 1 rail at home which does not provide enough support for her knee with stair negotiation. Pt was recommended to install another rail for her stairs to decrease level of discomfort negotiating them at home. Pt verbalized understanding. Pt tolerated session well without aggravation of symptoms. Pt will benefit from continued skilled physical therapy services to decrease pain, improve strength, function, and decrease difficulty with stair negotiation.    Personal Factors and Comorbidities  Age;Comorbidity 2;Fitness;Time since onset of injury/illness/exacerbation    Comorbidities  Osteopenia, aortic anneurism    Examination-Activity Limitations  Stairs    Stability/Clinical Decision Making  Stable/Uncomplicated    Clinical Decision Making  Low    Clinical Presentation due to:  Pt making  progress with decreasing knee pain    Rehab Potential  Fair    Clinical Impairments Affecting Rehab Potential  age, work schedule    PT Frequency  2x / week    PT Duration  6 weeks    PT Treatment/Interventions  Aquatic Therapy;Electrical Stimulation;Iontophoresis 40m/ml Dexamethasone;Gait training;Stair training;Therapeutic activities;Therapeutic exercise;Balance training;Neuromuscular re-education;Patient/family education;Manual techniques;Dry needling    PT Next Visit Plan  hip strengthening, femoral control, manual techniques, modalities PRN    PT Home Exercise Plan  Medbridge Access code [phone]   Consulted and Agree with Plan of Care  Patient       Patient will benefit from skilled therapeutic intervention in order to improve the following deficits and impairments:  Pain, Postural dysfunction, Improper body mechanics, Decreased strength, Decreased range of motion  Visit Diagnosis: Difficulty in walking, not elsewhere classified - Plan: PT plan of care cert/re-cert  Chronic pain of left knee - Plan: PT plan of care cert/re-cert  Pain in right foot - Plan: PT plan of care cert/re-cert  Chronic pain of right knee - Plan: PT plan of care cert/re-cert  Muscle weakness (generalized) - Plan: PT plan of care cert/re-cert  Pain in left foot - Plan: PT plan of care cert/re-cert     Problem List Patient Active Problem List   Diagnosis Date Noted  . Arthritis of knee, degenerative 05/22/2018  . Cataract, right eye 05/17/2018  . Aortic stenosis, mild 02/22/2018  . Obesity (BMI 30.0-34.9) 01/18/2018  . Hip pain, chronic 04/02/2015  . Hearing loss of both ears 04/02/2015  . Osteopenia of the elderly 04/02/2015  . Hormone replacement therapy (postmenopausal) 04/02/2015  . Aortic aneurysm without rupture ([place] 04/02/2015  . Situational anxiety 04/02/2015  . Hyperlipidemia LDL goal <70 11/18/2014  . Mixed stress and urge urinary incontinence 06/29/2014  . Hypertension goal BP  (blood pressure) < 140/90 11/06/2013  . CAD (coronary artery disease) 11/06/2013     Thank you for your referral.   MJoneen BoersPT, DPT   06/12/2019, 12:57 PM  [facility] [address] [place] [address]Phone: [phone]  Fax:  [phone] Name: Angel TRAVAGLINIMRN: [id number] of Birth: [birth year]

## 2019-06-12 NOTE — Patient Instructions (Signed)
Pt was recommended to add another hand rail to her stairs to decrease stress to her knees when negotiating them. Pt demonstrated and verbalized understanding.

## 2019-06-17 ENCOUNTER — Ambulatory Visit: Payer: Medicare Other

## 2019-06-19 ENCOUNTER — Ambulatory Visit: Payer: Medicare Other

## 2019-06-24 ENCOUNTER — Other Ambulatory Visit: Payer: Self-pay

## 2019-06-24 ENCOUNTER — Ambulatory Visit: Payer: Medicare Other

## 2019-06-24 DIAGNOSIS — G8929 Other chronic pain: Secondary | ICD-10-CM

## 2019-06-24 DIAGNOSIS — R262 Difficulty in walking, not elsewhere classified: Secondary | ICD-10-CM | POA: Diagnosis not present

## 2019-06-24 DIAGNOSIS — M25562 Pain in left knee: Secondary | ICD-10-CM

## 2019-06-24 DIAGNOSIS — M79672 Pain in left foot: Secondary | ICD-10-CM

## 2019-06-24 DIAGNOSIS — M6281 Muscle weakness (generalized): Secondary | ICD-10-CM | POA: Diagnosis not present

## 2019-06-24 DIAGNOSIS — M79671 Pain in right foot: Secondary | ICD-10-CM

## 2019-06-24 DIAGNOSIS — M25561 Pain in right knee: Secondary | ICD-10-CM | POA: Diagnosis not present

## 2019-06-24 NOTE — Therapy (Signed)
[facility] [address] [place], [address] Phone: [phone]   Fax:  [phone]  Physical Therapy Treatment  Patient Details  Name: Angel [name] MRN: [id number] Date of Birth: 1930/10/21 Referring Provider (PT): Delsa Grana, [place]   Encounter Date: 06/24/2019  PT End of Session - 06/24/19 1307    Visit Number  31    Number of Visits  90    Date for PT Re-Evaluation  07/25/19    Authorization Type  1    Authorization Time Period  10    PT Start Time  1307   pt arrived late   PT Stop Time  1338    PT Time Calculation (min)  31 min    Activity Tolerance  Patient tolerated treatment well    Behavior During Therapy  [facility] for tasks assessed/performed       Past Medical History:  Diagnosis Date  . Anxiety   . Aortic aneurysm without rupture ([place])   . CAD (coronary artery disease)   . Chronic hip pain   . Endometriosis   . Hearing loss   . HTN, goal below 150/90   . Hyperlipidemia LDL goal <70 11/18/2014  . Osteopenia of the elderly   . Urge and stress incontinence     Past Surgical History:  Procedure Laterality Date  . ABDOMINAL HYSTERECTOMY  1976   total  . APPENDECTOMY    . BREAST EXCISIONAL BIOPSY Left 1987   benign    There were no vitals filed for this visit.  Subjective Assessment - 06/24/19 1308    Subjective  Pt got a carpenter to put a rail up for her steps.  Pt has to leave by 1:40 pm for a dentist appointment. Knees and feet are better today than yesterday. No pain currently.    Pertinent History  B knee pain since a year or more. Walking up the steps bothers her. Walking around regularly is no pain. Pt lives in a 2 story home, first floor set up. 14 steps to get to second floor, L banister rail starting a third of the way up.  No steps to enter front and back door.  Gradual onset of B knee pain. Has been to Dr. Marry Guan who did x-rays who said that there is still cartilage in both  knees. Was diagnosed with osteoarthritis.  [place] Walking up hills bothers her knees.  Pt also walks about 20 minutes a day walking her dog.  R knee feels fragile when she starts with it to step up a step therefore she starts with her L, then she can step up with her R LE. Also goes to a chiropracter for adjustments.  Pt also has 5 step to go down to her meditation room, R rail assist in her home.  Has not yet used ice for her knees.   Pt adds that she has B foot pronation and plantar arch pain in which her orthotics that she wears helps her a lot.     Currently in Pain?  No/denies    Pain Score  0-No pain                               PT Education - 06/24/19 1351    Education Details  ther-ex    [employer]) Educated  Patient  Methods  Explanation;Demonstration;Tactile cues;Verbal cues    Comprehension  Returned demonstration;Verbalized understanding       Objective  MedbridgeAccess Code: [email or web address]   No latex band allergies Blood pressure is controlled per pt.  Aortic aneurism osteopenia  Pt demonstrates B foot pronation. Pt states wearing orthotics  Pt states that she got a letter from Dr. Sanda Klein stating that she is leaving the practice due to health issues. Pt was recommended to see their nurse practitioner [name]   Pt states that getting up from the couch is hard for her.     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     Response to treatment Good muscle use felt with exercises.Pt tolerated session well without aggravation of symptoms.  Clinical impression Pt doing well in terms of decreased B knee and ankle pain based on subjective reports. Treatment time adjusted accordingly secondary to pt arriving late and needing to leave early for her dentist appointment. Continued working on decreasing muscle tension around her knees as well as promoting glute strength to promote better mechanics at her knees and ankle. Pt tolerated session well without complain of pain. Pt will benefit from continued skilled physical therapy services to decrease pain, improve strength and function.    PT Short Term Goals - 09/20/18 1905      PT SHORT TERM GOAL #1   Title  Patient will be independent with her HEP to decrease pain, improve strength and function.     Time  3    Period  Weeks    Status  On-going    Target Date  10/11/18        PT Long Term Goals - 06/12/19 0958      PT LONG TERM GOAL #1   Title  Patient will have a decrease in B knee pain to 2/10 or less at worst to decrease difficulty negotiating stairs or climbing up hills.     Baseline  R knee 5/10 , L knee 4.5/10 at worst for the past 3 months (07/16/2018); 6/10 R knee pain at most, 4.5/10 L knee pain at most for the past 7 days (09/20/2018); 3/10 B knee pain at worst for the past 7 days (09/27/2018); 3/10 B knee pain at worst for the past 2 weeks (12/25/2018). 1.5/10 R knee pain (except with stairs), 1.5/10 L knee pain at most for the past 7 days except with steps/stairs (01/22/2019); 3.5/10 for both knees at most for the past 7 days going up and down steps. 2/10 at most B knees without the stairs (02/07/2019); 2.5/10 B knee pain at most for the past 7 days including stairs (03/12/2019); 3.5/10 B knee pain at most for the past 3 weeks (05/08/2019);  2/10 B knee  pain at most for the past 7 days (06/12/2019)    Time  6  Period  Weeks    Status  Partially Met    Target Date  07/25/19      PT LONG TERM GOAL #2   Title  Patient will be able to ascend and descend 4 regular steps with bilateral UE assist at least 2 x with minimal to no complain of B knee pain.     Baseline  Increased B knee pain with ascending and descending 4 regular steps with B UE assist (07/16/2018). Still has pain but feels slight improvement (09/20/2018); increased B knee pain with stairs (12/25/2018), (01/22/2019); Minimal to no complain of B knee pain (03/12/2019), minimal pain but her steps at home are higher per pt (06/12/2019)    Time  6    Period  Weeks    Status  Partially Met    Target Date  07/25/19      PT LONG TERM GOAL #3   Title  Patient will improve B hip extension and abduction strengh by at least 1/2 MMT to promote ability to negotiate stairs and climb hills with less pain.     Time  6    Period  Weeks    Status  Partially Met    Target Date  07/25/19      PT LONG TERM GOAL #4   Title  Patient will improve her 6 minute walk distance by at least 100 ft to promote mobility.     Baseline  1045 ft with L lateral hip pain/discomfort (07/16/2018); 1082 ft (09/20/2018); 910 ft (12/25/2018); 1008 ft, no pain or discomfort (01/22/2019); 1063 ft (02/07/2019); 985 ft  (03/12/19); 1185 ft (05/08/2019)    Time  6    Period  Weeks    Status  Achieved      PT LONG TERM GOAL #5   Title  Patient will have a decrease in R and L foot pain to 2/10 or less at worst to promote ability to ambulate with less difficulty.     Baseline  5/10 R foot pain, 3/10 L foot pain at most for the past 2 weeks (12/25/2018); 3.5/10 R foot and L foot pain at most for the past 7 days (01/22/2019); 2.5/10 B foot pain at most for the past 7 days (02/07/2019); 4/10 at most R foot for the past 7 days (03/12/2019); 4/10 B foot pain at most for the past 3 weeks (05/08/2019); 3/10 B foot pain (at the arch area) at most for the  past 7 days (06/12/2019)    Time  6    Period  Weeks    Status  Partially Met    Target Date  07/25/19            Plan - 06/24/19 1352    Clinical Impression Statement  Pt doing well in terms of decreased B knee and ankle pain based on subjective reports. Treatment time adjusted accordingly secondary to pt arriving late and needing to leave early for her dentist appointment. Continued working on decreasing muscle tension around her knees as well as promoting glute strength to promote better mechanics at her knees and ankle. Pt tolerated session well without complain of pain. Pt will benefit from continued skilled physical therapy services to decrease pain, improve strength and function.    Personal Factors and Comorbidities  Age;Comorbidity 2;Fitness;Time since onset of injury/illness/exacerbation    Comorbidities  Osteopenia, aortic anneurism    Examination-Activity Limitations  Stairs    Stability/Clinical Decision Making  Stable/Uncomplicated    Rehab Potential  Fair  Clinical Impairments Affecting Rehab Potential  age, work schedule    PT Frequency  2x / week    PT Duration  6 weeks    PT Treatment/Interventions  Aquatic Therapy;Electrical Stimulation;Iontophoresis 63m/ml Dexamethasone;Gait training;Stair training;Therapeutic activities;Therapeutic exercise;Balance training;Neuromuscular re-education;Patient/family education;Manual techniques;Dry needling    PT Next Visit Plan  hip strengthening, femoral control, manual techniques, modalities PRN    PT Home Exercise Plan  Medbridge Access code [phone]   Consulted and Agree with Plan of Care  Patient       Patient will benefit from skilled therapeutic intervention in order to improve the following deficits and impairments:  Pain, Postural dysfunction, Improper body mechanics, Decreased strength, Decreased range of motion  Visit Diagnosis: Difficulty in walking, not elsewhere classified  Chronic pain of left knee  Chronic  pain of right knee  Pain in right foot  Muscle weakness (generalized)  Pain in left foot     Problem List Patient Active Problem List   Diagnosis Date Noted  . Arthritis of knee, degenerative 05/22/2018  . Cataract, right eye 05/17/2018  . Aortic stenosis, mild 02/22/2018  . Obesity (BMI 30.0-34.9) 01/18/2018  . Hip pain, chronic 04/02/2015  . Hearing loss of both ears 04/02/2015  . Osteopenia of the elderly 04/02/2015  . Hormone replacement therapy (postmenopausal) 04/02/2015  . Aortic aneurysm without rupture ([place] 04/02/2015  . Situational anxiety 04/02/2015  . Hyperlipidemia LDL goal <70 11/18/2014  . Mixed stress and urge urinary incontinence 06/29/2014  . Hypertension goal BP (blood pressure) < 140/90 11/06/2013  . CAD (coronary artery disease) 11/06/2013    MJoneen BoersPT, DPT   06/24/2019, 1:57 PM  [facility] [address] [place] [address]Phone: [phone]  Fax:  [phone] Name: Angel NEARYMRN: [id number] of Birth: 407-28-1932

## 2019-06-26 ENCOUNTER — Other Ambulatory Visit: Payer: Self-pay

## 2019-06-26 ENCOUNTER — Ambulatory Visit: Payer: Medicare Other

## 2019-06-26 DIAGNOSIS — G8929 Other chronic pain: Secondary | ICD-10-CM

## 2019-06-26 DIAGNOSIS — M6281 Muscle weakness (generalized): Secondary | ICD-10-CM

## 2019-06-26 DIAGNOSIS — R262 Difficulty in walking, not elsewhere classified: Secondary | ICD-10-CM | POA: Diagnosis not present

## 2019-06-26 DIAGNOSIS — M25561 Pain in right knee: Secondary | ICD-10-CM | POA: Diagnosis not present

## 2019-06-26 DIAGNOSIS — M79672 Pain in left foot: Secondary | ICD-10-CM

## 2019-06-26 DIAGNOSIS — M25562 Pain in left knee: Secondary | ICD-10-CM | POA: Diagnosis not present

## 2019-06-26 DIAGNOSIS — M79671 Pain in right foot: Secondary | ICD-10-CM | POA: Diagnosis not present

## 2019-06-26 NOTE — Therapy (Signed)
[facility] [address], [place], [address] Phone: [phone]   Fax:  [phone]  Physical Therapy Treatment  Patient Details  Name: Angel [name] MRN: [id number] Date of Birth: 1931-01-26 Referring Provider (PT): Delsa Grana, [place]   Encounter Date: 06/26/2019  PT End of Session - 06/26/19 1008    Visit Number  32    Number of Visits  36    Date for PT Re-Evaluation  07/25/19    Authorization Type  2    Authorization Time Period  10    PT Start Time  1006    PT Stop Time  1027    PT Time Calculation (min)  21 min    Activity Tolerance  Patient tolerated treatment well    Behavior During Therapy  [facility] for tasks assessed/performed       Past Medical History:  Diagnosis Date  . Anxiety   . Aortic aneurysm without rupture ([place])   . CAD (coronary artery disease)   . Chronic hip pain   . Endometriosis   . Hearing loss   . HTN, goal below 150/90   . Hyperlipidemia LDL goal <70 11/18/2014  . Osteopenia of the elderly   . Urge and stress incontinence     Past Surgical History:  Procedure Laterality Date  . ABDOMINAL HYSTERECTOMY  1976   total  . APPENDECTOMY    . BREAST EXCISIONAL BIOPSY Left 1987   benign    There were no vitals filed for this visit.  Subjective Assessment - 06/26/19 1009    Subjective  Knees and feet are 2.5/10 currently.    Pertinent History  B knee pain since a year or more. Walking up the steps bothers her. Walking around regularly is no pain. Pt lives in a 2 story home, first floor set up. 14 steps to get to second floor, L banister rail starting a third of the way up.  No steps to enter front and back door.  Gradual onset of B knee pain. Has been to Dr. Marry Guan who did x-rays who said that there is still cartilage in both knees. Was diagnosed with osteoarthritis.  [place] Walking up hills bothers her knees.  Pt  also walks about 20 minutes a day walking her dog.  R knee feels fragile when she starts with it to step up a step therefore she starts with her L, then she can step up with her R LE. Also goes to a chiropracter for adjustments.  Pt also has 5 step to go down to her meditation room, R rail assist in her home.  Has not yet used ice for her knees.   Pt adds that she has B foot pronation and plantar arch pain in which her orthotics that she wears helps her a lot.     Currently in Pain?  Yes    Pain Score  3    2.5/10 currently.                              PT Education - 06/26/19 1010    Education Details  ther-ex    Person(s) Educated  Patient    Methods  Explanation;Demonstration;Tactile cues;Verbal cues    Comprehension  Verbalized understanding;Returned demonstration       Objective  MedbridgeAccess Code: [email or web address]   No latex  band allergies Blood pressure is controlled per pt.  Aortic aneurism osteopenia  Pt demonstrates B foot pronation. Pt states wearing orthotics  Pt states that she got a letter from Dr. Sanda Klein stating that she is leaving the practice due to health issues. Pt was recommended to see their nurse practitioner [name]   Pt states that getting up from the couch is hard for her.     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     Response to treatment Good muscle use felt with exercises.Pt tolerated session well without aggravation of symptoms.   Clinical impression Pt states knees and anklsefeel so much better and no pain after session. Pt will benefit from continued skilled  physical therapy to continue to decrease soft tissue restricstions and promote better mechanics at her knee and ankles and help decrease pain with standing tasks. Pt demonstrates busy life schedule and will benefit from continued skilled physical therapy services in the clinic to help patient receive treatment (pt might not perform HEP otherwise if not coming to PT).     PT Short Term Goals - 09/20/18 1905      PT SHORT TERM GOAL #1   Title  Patient will be independent with her HEP to decrease pain, improve strength and function.     Time  3    Period  Weeks    Status  On-going    Target Date  10/11/18        PT Long Term Goals - 06/12/19 0958      PT LONG TERM GOAL #1   Title  Patient will have a decrease in B knee pain to 2/10 or less at worst to decrease difficulty negotiating stairs or climbing up hills.     Baseline  R knee 5/10 , L knee 4.5/10 at worst for the past 3 months (07/16/2018); 6/10 R knee pain at most, 4.5/10 L knee pain at most for the past 7 days (09/20/2018); 3/10 B knee pain at worst for the past 7 days (09/27/2018); 3/10 B knee pain at worst for the past 2 weeks (12/25/2018). 1.5/10 R knee pain (except with stairs), 1.5/10 L knee pain at most for the past 7 days except with steps/stairs (01/22/2019); 3.5/10 for both knees at most for the past 7 days going up and down steps. 2/10 at most B knees without the stairs (02/07/2019); 2.5/10 B knee pain at most for the past 7 days including stairs (03/12/2019); 3.5/10 B knee pain at most for the past 3 weeks (05/08/2019);  2/10 B knee pain at most for the past 7 days (06/12/2019)    Time  6    Period  Weeks    Status  Partially Met    Target Date  07/25/19      PT LONG TERM GOAL #2   Title  Patient will be able to ascend and descend 4 regular steps with bilateral UE assist at least 2 x with minimal to no complain of B knee pain.     Baseline  Increased B knee pain with ascending and descending 4 regular steps with B UE assist  (07/16/2018). Still has pain but feels slight improvement (  09/20/2018); increased B knee pain with stairs (12/25/2018), (01/22/2019); Minimal to no complain of B knee pain (03/12/2019), minimal pain but her steps at home are higher per pt (06/12/2019)    Time  6    Period  Weeks    Status  Partially Met    Target Date  07/25/19      PT LONG TERM GOAL #3   Title  Patient will improve B hip extension and abduction strengh by at least 1/2 MMT to promote ability to negotiate stairs and climb hills with less pain.     Time  6    Period  Weeks    Status  Partially Met    Target Date  07/25/19      PT LONG TERM GOAL #4   Title  Patient will improve her 6 minute walk distance by at least 100 ft to promote mobility.     Baseline  1045 ft with L lateral hip pain/discomfort (07/16/2018); 1082 ft (09/20/2018); 910 ft (12/25/2018); 1008 ft, no pain or discomfort (01/22/2019); 1063 ft (02/07/2019); 985 ft  (03/12/19); 1185 ft (05/08/2019)    Time  6    Period  Weeks    Status  Achieved      PT LONG TERM GOAL #5   Title  Patient will have a decrease in R and L foot pain to 2/10 or less at worst to promote ability to ambulate with less difficulty.     Baseline  5/10 R foot pain, 3/10 L foot pain at most for the past 2 weeks (12/25/2018); 3.5/10 R foot and L foot pain at most for the past 7 days (01/22/2019); 2.5/10 B foot pain at most for the past 7 days (02/07/2019); 4/10 at most R foot for the past 7 days (03/12/2019); 4/10 B foot pain at most for the past 3 weeks (05/08/2019); 3/10 B foot pain (at the arch area) at most for the past 7 days (06/12/2019)    Time  6    Period  Weeks    Status  Partially Met    Target Date  07/25/19            Plan - 06/26/19 1010    Clinical Impression Statement  Pt states knees and anklsefeel so much better and no pain after session. Pt will benefit from continued skilled physical therapy to continue to decrease soft tissue restricstions and promote better mechanics at her knee and  ankles and help decrease pain with standing tasks. Pt demonstrates busy life schedule and will benefit from continued skilled physical therapy services in the clinic to help patient receive treatment (pt might not perform HEP otherwise if not coming to PT).    Personal Factors and Comorbidities  Age;Comorbidity 2;Fitness;Time since onset of injury/illness/exacerbation    Comorbidities  Osteopenia, aortic anneurism    Examination-Activity Limitations  Stairs    Stability/Clinical Decision Making  Stable/Uncomplicated    Rehab Potential  Fair    Clinical Impairments Affecting Rehab Potential  age, work schedule    PT Frequency  2x / week    PT Duration  6 weeks    PT Treatment/Interventions  Aquatic Therapy;Electrical Stimulation;Iontophoresis 38m/ml Dexamethasone;Gait training;Stair training;Therapeutic activities;Therapeutic exercise;Balance training;Neuromuscular re-education;Patient/family education;Manual techniques;Dry needling    PT Next Visit Plan  hip strengthening, femoral control, manual techniques, modalities PRN    PT HMorganzaAccess code [email or web address]    Consulted and Agree with Plan of Care  Patient  Patient will benefit from skilled therapeutic intervention in order to improve the following deficits and impairments:  Pain, Postural dysfunction, Improper body mechanics, Decreased strength, Decreased range of motion  Visit Diagnosis: Difficulty in walking, not elsewhere classified  Chronic pain of left knee  Chronic pain of right knee  Pain in right foot  Muscle weakness (generalized)  Pain in left foot     Problem List Patient Active Problem List   Diagnosis Date Noted  . Arthritis of knee, degenerative 05/22/2018  . Cataract, right eye 05/17/2018  . Aortic stenosis, mild 02/22/2018  . Obesity (BMI 30.0-34.9) 01/18/2018  . Hip pain, chronic 04/02/2015  . Hearing loss of both ears 04/02/2015  . Osteopenia of the elderly 04/02/2015  .  Hormone replacement therapy (postmenopausal) 04/02/2015  . Aortic aneurysm without rupture ([place]) 04/02/2015  . Situational anxiety 04/02/2015  . Hyperlipidemia LDL goal <70 11/18/2014  . Mixed stress and urge urinary incontinence 06/29/2014  . Hypertension goal BP (blood pressure) < 140/90 11/06/2013  . CAD (coronary artery disease) 11/06/2013    Joneen Boers PT, DPT   06/26/2019, 2:31 PM  [facility] [address] [place], [address] Phone: [phone]   Fax:  [phone]  Name: Angel [name] MRN: [id number] Date of Birth: February 19, 1931

## 2019-07-01 ENCOUNTER — Other Ambulatory Visit: Payer: Self-pay

## 2019-07-01 ENCOUNTER — Ambulatory Visit: Payer: Medicare Other

## 2019-07-01 DIAGNOSIS — M79671 Pain in right foot: Secondary | ICD-10-CM | POA: Diagnosis not present

## 2019-07-01 DIAGNOSIS — G8929 Other chronic pain: Secondary | ICD-10-CM | POA: Diagnosis not present

## 2019-07-01 DIAGNOSIS — M6281 Muscle weakness (generalized): Secondary | ICD-10-CM | POA: Diagnosis not present

## 2019-07-01 DIAGNOSIS — M25562 Pain in left knee: Secondary | ICD-10-CM

## 2019-07-01 DIAGNOSIS — M79672 Pain in left foot: Secondary | ICD-10-CM

## 2019-07-01 DIAGNOSIS — R262 Difficulty in walking, not elsewhere classified: Secondary | ICD-10-CM

## 2019-07-01 DIAGNOSIS — M25561 Pain in right knee: Secondary | ICD-10-CM | POA: Diagnosis not present

## 2019-07-01 NOTE — Therapy (Signed)
[facility] [address] [place], [address] Phone: [phone]   Fax:  [phone]  Physical Therapy Treatment  Patient Details  Name: Angel [name] MRN: [id number] Date of Birth: [birth year] Referring Provider (PT): Delsa Grana, [place]   Encounter Date: 07/01/2019  PT End of Session - 07/01/19 1358    Visit Number  33    Number of Visits  62    Date for PT Re-Evaluation  07/25/19    Authorization Type  3    Authorization Time Period  10    PT Start Time  1358   pt late   PT Stop Time  1439    PT Time Calculation (min)  41 min    Activity Tolerance  Patient tolerated treatment well    Behavior During Therapy  [facility] for tasks assessed/performed       Past Medical History:  Diagnosis Date  . Anxiety   . Aortic aneurysm without rupture ([place])   . CAD (coronary artery disease)   . Chronic hip pain   . Endometriosis   . Hearing loss   . HTN, goal below 150/90   . Hyperlipidemia LDL goal <70 11/18/2014  . Osteopenia of the elderly   . Urge and stress incontinence     Past Surgical History:  Procedure Laterality Date  . ABDOMINAL HYSTERECTOMY  1976   total  . APPENDECTOMY    . BREAST EXCISIONAL BIOPSY Left 1987   benign    There were no vitals filed for this visit.  Subjective Assessment - 07/01/19 1358    Subjective  The knees and feet are all working today. Someone's going to add the stair rail to her steps tomorrow.    Pertinent History  B knee pain since a year or more. Walking up the steps bothers her. Walking around regularly is no pain. Pt lives in a 2 story home, first floor set up. 14 steps to get to second floor, L banister rail starting a third of the way up.  No steps to enter front and back door.  Gradual onset of B knee pain. Has been to Dr. Marry Guan who did x-rays who said that there is still cartilage in both knees. Was diagnosed with osteoarthritis.  [place]. Walking up hills bothers her knees.  Pt also walks about 20 minutes a day walking her dog.  R knee feels fragile when she starts with it to step up a step therefore she starts with her L, then she can step up with her R LE. Also goes to a chiropracter for adjustments.  Pt also has 5 step to go down to her meditation room, R rail assist in her home.  Has not yet used ice for her knees.   Pt adds that she has B foot pronation and plantar arch pain in which her orthotics that she wears helps her a lot.     Currently in Pain?  Other (Comment)   no mention of pain                              PT Education - 07/01/19 1404    Education Details  ther-ex    Person(s) Educated  Patient    Methods  Explanation;Tactile cues;Demonstration;Verbal cues    Comprehension  Returned demonstration;Verbalized understanding  Objective  MedbridgeAccess Code: [email or web address]   No latex band allergies Blood pressure is controlled per pt.  Aortic aneurism osteopenia  Pt demonstrates B foot pronation. Pt states wearing orthotics  Pt states that she got a letter from Dr. Sanda Klein stating that she is leaving the practice due to health issues. Pt was recommended to see their nurse practitioner [name]   Pt states that getting up from the couch is hard for her.     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 L knee pain  Cues for neutral knees   Seated manually resisted hip extension   R 10x3  L 10x3  PT manual resistance provided adjusts to continue to challenge hip extensor muscles   Seated knee flexion resisting green band targeting medial  hamstrings   R 30x  L 30x     Seated hip IR to promote glute med and min strengthening  R 10x3  L 10x3  Standing mini squats to promote general LE strengthening  10x  Standing ankle DF/PF on rocker board with B UE assist x 2 minutes to promote gastroc muscle stretch.    Improved exercise technique, movement at target joints, use of target muscles after mod verbal, visual, tactile cues.    Manual therapy  Seated STM B lateral hamstrings, bilateral medial distal thighs, IT band, vastus lateralis to decrease tension      Response to treatment Good muscle use felt with exercises.Pt states knees feel much better after session    Clinical impression Continued working on glute med, max, medial hamstrings strengthening, as well as decreasing soft tissue tension around her knees to promote better mechanics with gait. Improved B knee level of comfort after session reported by pt. Pt will continue to benefit from skilled physical therapy services to decrease pain, improve strength, function, and decrease difficulty with gait and stair negotiation.             PT Short Term Goals - 09/20/18 1905      PT SHORT TERM GOAL #1   Title  Patient will be independent with her HEP to decrease pain, improve strength and function.     Time  3    Period  Weeks    Status  On-going    Target Date  10/11/18        PT Long Term Goals - 06/12/19 0958      PT LONG TERM GOAL #1   Title  Patient will have a decrease in B knee pain to 2/10 or less at worst to decrease difficulty negotiating stairs or climbing up hills.     Baseline  R knee 5/10 , L knee 4.5/10 at worst for the past 3 months (07/16/2018); 6/10 R knee pain at most, 4.5/10 L knee pain at most for the past 7 days (09/20/2018); 3/10 B knee pain at worst for the past 7 days (09/27/2018); 3/10 B knee pain at worst for the past 2 weeks (12/25/2018). 1.5/10 R knee pain (except with stairs), 1.5/10 L knee pain at most for the past 7  days except with steps/stairs (01/22/2019); 3.5/10 for both knees at most  for the past 7 days going up and down steps. 2/10 at most B knees without the stairs (02/07/2019); 2.5/10 B knee pain at most for the past 7 days including stairs (03/12/2019); 3.5/10 B knee pain at most for the past 3 weeks (05/08/2019);  2/10 B knee pain at most for the past 7 days (06/12/2019)    Time  6    Period  Weeks    Status  Partially Met    Target Date  07/25/19      PT LONG TERM GOAL #2   Title  Patient will be able to ascend and descend 4 regular steps with bilateral UE assist at least 2 x with minimal to no complain of B knee pain.     Baseline  Increased B knee pain with ascending and descending 4 regular steps with B UE assist (07/16/2018). Still has pain but feels slight improvement (09/20/2018); increased B knee pain with stairs (12/25/2018), (01/22/2019); Minimal to no complain of B knee pain (03/12/2019), minimal pain but her steps at home are higher per pt (06/12/2019)    Time  6    Period  Weeks    Status  Partially Met    Target Date  07/25/19      PT LONG TERM GOAL #3   Title  Patient will improve B hip extension and abduction strengh by at least 1/2 MMT to promote ability to negotiate stairs and climb hills with less pain.     Time  6    Period  Weeks    Status  Partially Met    Target Date  07/25/19      PT LONG TERM GOAL #4   Title  Patient will improve her 6 minute walk distance by at least 100 ft to promote mobility.     Baseline  1045 ft with L lateral hip pain/discomfort (07/16/2018); 1082 ft (09/20/2018); 910 ft (12/25/2018); 1008 ft, no pain or discomfort (01/22/2019); 1063 ft (02/07/2019); 985 ft  (03/12/19); 1185 ft (05/08/2019)    Time  6    Period  Weeks    Status  Achieved      PT LONG TERM GOAL #5   Title  Patient will have a decrease in R and L foot pain to 2/10 or less at worst to promote ability to ambulate with less difficulty.     Baseline  5/10 R foot pain, 3/10 L foot pain at most for the  past 2 weeks (12/25/2018); 3.5/10 R foot and L foot pain at most for the past 7 days (01/22/2019); 2.5/10 B foot pain at most for the past 7 days (02/07/2019); 4/10 at most R foot for the past 7 days (03/12/2019); 4/10 B foot pain at most for the past 3 weeks (05/08/2019); 3/10 B foot pain (at the arch area) at most for the past 7 days (06/12/2019)    Time  6    Period  Weeks    Status  Partially Met    Target Date  07/25/19            Plan - 07/01/19 1405    Clinical Impression Statement  Continued working on glute med, max, medial hamstrings strengthening, as well as decreasing soft tissue tension around her knees to promote better mechanics with gait. Improved B knee level of comfort after session reported by pt. Pt will continue to benefit from skilled physical therapy services to decrease pain, improve strength, function, and decrease difficulty with gait and stair negotiation.  Personal Factors and Comorbidities  Age;Comorbidity 2;Fitness;Time since onset of injury/illness/exacerbation    Comorbidities  Osteopenia, aortic anneurism    Examination-Activity Limitations  Stairs    Stability/Clinical Decision Making  Stable/Uncomplicated    Rehab Potential  Fair    Clinical Impairments Affecting Rehab Potential  age, work schedule    PT Frequency  2x / week    PT Duration  6 weeks    PT Treatment/Interventions  Aquatic Therapy;Electrical Stimulation;Iontophoresis 18m/ml Dexamethasone;Gait training;Stair training;Therapeutic activities;Therapeutic exercise;Balance training;Neuromuscular re-education;Patient/family education;Manual techniques;Dry needling    PT Next Visit Plan  hip strengthening, femoral control, manual techniques, modalities PRN    PT Home Exercise Plan  Medbridge Access code [phone]   Consulted and Agree with Plan of Care  Patient       Patient will benefit from skilled therapeutic intervention in order to improve the following deficits and impairments:  Pain, Postural  dysfunction, Improper body mechanics, Decreased strength, Decreased range of motion  Visit Diagnosis: Difficulty in walking, not elsewhere classified  Chronic pain of left knee  Chronic pain of right knee  Pain in right foot  Muscle weakness (generalized)  Pain in left foot     Problem List Patient Active Problem List   Diagnosis Date Noted  . Arthritis of knee, degenerative 05/22/2018  . Cataract, right eye 05/17/2018  . Aortic stenosis, mild 02/22/2018  . Obesity (BMI 30.0-34.9) 01/18/2018  . Hip pain, chronic 04/02/2015  . Hearing loss of both ears 04/02/2015  . Osteopenia of the elderly 04/02/2015  . Hormone replacement therapy (postmenopausal) 04/02/2015  . Aortic aneurysm without rupture ([place] 04/02/2015  . Situational anxiety 04/02/2015  . Hyperlipidemia LDL goal <70 11/18/2014  . Mixed stress and urge urinary incontinence 06/29/2014  . Hypertension goal BP (blood pressure) < 140/90 11/06/2013  . CAD (coronary artery disease) 11/06/2013    MJoneen BoersPT, DPT   07/01/2019, 2:55 PM  [facility] [address] [place] [address]Phone: [phone]  Fax:  [phone] Name: Angel MELCHERMRN: [id number] of Birth: 421-Jan-1932

## 2019-07-02 DIAGNOSIS — M9902 Segmental and somatic dysfunction of thoracic region: Secondary | ICD-10-CM | POA: Diagnosis not present

## 2019-07-02 DIAGNOSIS — M5134 Other intervertebral disc degeneration, thoracic region: Secondary | ICD-10-CM | POA: Diagnosis not present

## 2019-07-02 DIAGNOSIS — M5416 Radiculopathy, lumbar region: Secondary | ICD-10-CM | POA: Diagnosis not present

## 2019-07-02 DIAGNOSIS — M62830 Muscle spasm of back: Secondary | ICD-10-CM | POA: Diagnosis not present

## 2019-07-03 ENCOUNTER — Ambulatory Visit: Payer: Medicare Other

## 2019-07-03 ENCOUNTER — Other Ambulatory Visit: Payer: Self-pay

## 2019-07-03 DIAGNOSIS — M79671 Pain in right foot: Secondary | ICD-10-CM | POA: Diagnosis not present

## 2019-07-03 DIAGNOSIS — R262 Difficulty in walking, not elsewhere classified: Secondary | ICD-10-CM

## 2019-07-03 DIAGNOSIS — M25562 Pain in left knee: Secondary | ICD-10-CM

## 2019-07-03 DIAGNOSIS — M25561 Pain in right knee: Secondary | ICD-10-CM | POA: Diagnosis not present

## 2019-07-03 DIAGNOSIS — G8929 Other chronic pain: Secondary | ICD-10-CM

## 2019-07-03 DIAGNOSIS — M79672 Pain in left foot: Secondary | ICD-10-CM

## 2019-07-03 DIAGNOSIS — M6281 Muscle weakness (generalized): Secondary | ICD-10-CM | POA: Diagnosis not present

## 2019-07-03 NOTE — Therapy (Addendum)
[facility] [address], [place], [address] Phone: [phone]   Fax:  [phone]  Physical Therapy Treatment  Patient Details  Name: Angel [name] MRN: [id number] Date of Birth: 1931-04-30 Referring Provider (PT): Delsa Grana, [place]   Encounter Date: 07/03/2019  PT End of Session - 07/03/19 0907    Visit Number  34    Number of Visits  73    Date for PT Re-Evaluation  07/25/19    Authorization Type  4    Authorization Time Period  10    PT Start Time  0907    PT Stop Time  0951    PT Time Calculation (min)  44 min    Activity Tolerance  Patient tolerated treatment well    Behavior During Therapy  [facility] for tasks assessed/performed       Past Medical History:  Diagnosis Date  . Anxiety   . Aortic aneurysm without rupture ([place])   . CAD (coronary artery disease)   . Chronic hip pain   . Endometriosis   . Hearing loss   . HTN, goal below 150/90   . Hyperlipidemia LDL goal <70 11/18/2014  . Osteopenia of the elderly   . Urge and stress incontinence     Past Surgical History:  Procedure Laterality Date  . ABDOMINAL HYSTERECTOMY  1976   total  . APPENDECTOMY    . BREAST EXCISIONAL BIOPSY Left 1987   benign    There were no vitals filed for this visit.  Subjective Assessment - 07/03/19 0908    Subjective  Knees are unpredictable. Some days they are fine, others, not. Maybe due to the weather. The other railing for her steps is up now which helps with her knees. 2.5/10 B knees and ankles when walking currently.    Pertinent History  B knee pain since a year or more. Walking up the steps bothers her. Walking around regularly is no pain. Pt lives in a 2 story home, first floor set up. 14 steps to get to second floor, L banister rail starting a third of the way up.  No steps to enter front and back door.  Gradual onset of B knee pain. Has been to Dr. Marry Guan who did x-rays who said that there is still  cartilage in both knees. Was diagnosed with osteoarthritis.  [place]. Walking up hills bothers her knees.  Pt also walks about 20 minutes a day walking her dog.  R knee feels fragile when she starts with it to step up a step therefore she starts with her L, then she can step up with her R LE. Also goes to a chiropracter for adjustments.  Pt also has 5 step to go down to her meditation room, R rail assist in her home.  Has not yet used ice for her knees.   Pt adds that she has B foot pronation and plantar arch pain in which her orthotics that she wears helps her a lot.     Currently in Pain?  Yes    Pain Score  3    2.5/10                              PT Education - 07/03/19 1003    Education Details  ther-ex    [employer]) Educated  Patient  Methods  Explanation;Demonstration;Tactile cues;Verbal cues    Comprehension  Returned demonstration;Verbalized understanding          Objective  MedbridgeAccess Code: [email or web address]    No latex band allergies Blood pressure is controlled per pt.  Aortic aneurism osteopenia  Pt demonstrates B foot pronation. Pt states wearing orthotics  Pt states that she got a letter from Dr. Sanda Klein stating that she is leaving the practice due to health issues. Pt was recommended to see their nurse practitioner [name]   Pt states that getting up from the couch is hard for her.     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   Seated hip adduction pillow squeeze 10x10 seconds   Forward step up onto Dyna disc with contralateral UE assist  R 10x3  L 10x3  Lateral step up onto Dyna disc with B UE assist   R 10x3  L 10x3    Improved exercise technique,  movement at target joints, use of target muscles after mod verbal, visual, tactile cues.     Manual therapy  Seated STM B lateral hamstrings, bilateral medial distal thighs, IT band, vastus lateralis muscles to decrease tension      Response to treatment  Good muscle use felt with exercises.Pt states B knees and feet feel fine after session.    Clinical impression Improved ability to ascend and descend stairs at home after getting another rail added to her stairs for bilateral UE assist to decrease stress to her knees (as recommended by PT). Continued working on LE strengthening, stability and femoral control to promote better mechanics at her knee joints as well as decreasing soft tissue tension at her knees. Decreased pain reported by pt after session. Pt will benefit from continued skilled physical therapy services to decrease pain, improve strength and function.     PT Short Term Goals - 09/20/18 1905      PT SHORT TERM GOAL #1   Title  Patient will be independent with her HEP to decrease pain, improve strength and function.     Time  3    Period  Weeks    Status  On-going    Target Date  10/11/18        PT Long Term Goals - 06/12/19 0958      PT LONG TERM GOAL #1   Title  Patient will have a decrease in B knee pain to 2/10 or less at worst to decrease difficulty negotiating stairs or climbing up hills.     Baseline  R knee 5/10 , L knee 4.5/10 at worst for the past 3 months (07/16/2018); 6/10 R knee pain at most, 4.5/10 L knee pain at most for the past 7 days (09/20/2018); 3/10 B knee pain at worst for the past 7 days (09/27/2018); 3/10 B knee pain at worst for the past 2 weeks (12/25/2018). 1.5/10 R knee pain (except with stairs), 1.5/10 L knee pain at most for the past 7 days except with steps/stairs (01/22/2019); 3.5/10 for both knees at most for the past 7 days going up and down steps. 2/10 at most B knees without the stairs (02/07/2019); 2.5/10 B knee pain at most  for the past 7 days including stairs (03/12/2019); 3.5/10 B knee pain at most for the  past 3 weeks (05/08/2019);  2/10 B knee pain at most for the past 7 days (06/12/2019)    Time  6    Period  Weeks    Status  Partially Met    Target Date  07/25/19      PT LONG TERM GOAL #2   Title  Patient will be able to ascend and descend 4 regular steps with bilateral UE assist at least 2 x with minimal to no complain of B knee pain.     Baseline  Increased B knee pain with ascending and descending 4 regular steps with B UE assist (07/16/2018). Still has pain but feels slight improvement (09/20/2018); increased B knee pain with stairs (12/25/2018), (01/22/2019); Minimal to no complain of B knee pain (03/12/2019), minimal pain but her steps at home are higher per pt (06/12/2019)    Time  6    Period  Weeks    Status  Partially Met    Target Date  07/25/19      PT LONG TERM GOAL #3   Title  Patient will improve B hip extension and abduction strengh by at least 1/2 MMT to promote ability to negotiate stairs and climb hills with less pain.     Time  6    Period  Weeks    Status  Partially Met    Target Date  07/25/19      PT LONG TERM GOAL #4   Title  Patient will improve her 6 minute walk distance by at least 100 ft to promote mobility.     Baseline  1045 ft with L lateral hip pain/discomfort (07/16/2018); 1082 ft (09/20/2018); 910 ft (12/25/2018); 1008 ft, no pain or discomfort (01/22/2019); 1063 ft (02/07/2019); 985 ft  (03/12/19); 1185 ft (05/08/2019)    Time  6    Period  Weeks    Status  Achieved      PT LONG TERM GOAL #5   Title  Patient will have a decrease in R and L foot pain to 2/10 or less at worst to promote ability to ambulate with less difficulty.     Baseline  5/10 R foot pain, 3/10 L foot pain at most for the past 2 weeks (12/25/2018); 3.5/10 R foot and L foot pain at most for the past 7 days (01/22/2019); 2.5/10 B foot pain at most for the past 7 days (02/07/2019); 4/10 at most R foot for the past 7 days  (03/12/2019); 4/10 B foot pain at most for the past 3 weeks (05/08/2019); 3/10 B foot pain (at the arch area) at most for the past 7 days (06/12/2019)    Time  6    Period  Weeks    Status  Partially Met    Target Date  07/25/19            Plan - 07/03/19 1003    Clinical Impression Statement  Improved ability to ascend and descend stairs at home after getting another rail added to her stairs for bilateral UE assist to decrease stress to her knees (as recommended by PT). Continued working on LE strengthening, stability and femoral control to promote better mechanics at her knee joints as well as decreasing soft tissue tension at her knees. Decreased pain reported by pt after session. Pt will benefit from continued skilled physical therapy services to decrease pain, improve strength and function.    Personal Factors and Comorbidities  Age;Comorbidity 2;Fitness;Time since onset of injury/illness/exacerbation    Comorbidities  Osteopenia, aortic  anneurism    Examination-Activity Limitations  Stairs    Stability/Clinical Decision Making  Stable/Uncomplicated    Rehab Potential  Fair    Clinical Impairments Affecting Rehab Potential  age, work schedule    PT Frequency  2x / week    PT Duration  6 weeks    PT Treatment/Interventions  Aquatic Therapy;Electrical Stimulation;Iontophoresis 48m/ml Dexamethasone;Gait training;Stair training;Therapeutic activities;Therapeutic exercise;Balance training;Neuromuscular re-education;Patient/family education;Manual techniques;Dry needling    PT Next Visit Plan  hip strengthening, femoral control, manual techniques, modalities PRN    PT Home Exercise Plan  Medbridge Access code [phone]   Consulted and Agree with Plan of Care  Patient       Patient will benefit from skilled therapeutic intervention in order to improve the following deficits and impairments:  Pain, Postural dysfunction, Improper body mechanics, Decreased strength, Decreased range of  motion  Visit Diagnosis: Difficulty in walking, not elsewhere classified  Chronic pain of left knee  Chronic pain of right knee  Pain in right foot  Pain in left foot     Problem List Patient Active Problem List   Diagnosis Date Noted  . Arthritis of knee, degenerative 05/22/2018  . Cataract, right eye 05/17/2018  . Aortic stenosis, mild 02/22/2018  . Obesity (BMI 30.0-34.9) 01/18/2018  . Hip pain, chronic 04/02/2015  . Hearing loss of both ears 04/02/2015  . Osteopenia of the elderly 04/02/2015  . Hormone replacement therapy (postmenopausal) 04/02/2015  . Aortic aneurysm without rupture ([place] 04/02/2015  . Situational anxiety 04/02/2015  . Hyperlipidemia LDL goal <70 11/18/2014  . Mixed stress and urge urinary incontinence 06/29/2014  . Hypertension goal BP (blood pressure) < 140/90 11/06/2013  . CAD (coronary artery disease) 11/06/2013    MJoneen BoersPT, DPT   07/03/2019, 10:19 AM  [facility] [address] [place] [address]Phone: [phone]  Fax:  [phone] Name: Angel ERDMANNMRN: [id number] of Birth: 4July 31, 1932

## 2019-07-03 NOTE — Patient Instructions (Signed)
Standing ankle DF/PF on rocker board with B UE assist x 2 minutes to promote gastroc muscle stretch.   Reviewed and given as part of her HEP secondary to pt reporting that the exercise helps her. Pt ordering a rockerboard.  Pt was recommended to hold onto something sturdy for support for the exercise for safety. Pt verbalized understanding.

## 2019-07-08 ENCOUNTER — Other Ambulatory Visit: Payer: Self-pay

## 2019-07-08 ENCOUNTER — Ambulatory Visit: Payer: Medicare Other

## 2019-07-08 DIAGNOSIS — M25561 Pain in right knee: Secondary | ICD-10-CM

## 2019-07-08 DIAGNOSIS — M25562 Pain in left knee: Secondary | ICD-10-CM | POA: Diagnosis not present

## 2019-07-08 DIAGNOSIS — M79671 Pain in right foot: Secondary | ICD-10-CM

## 2019-07-08 DIAGNOSIS — G8929 Other chronic pain: Secondary | ICD-10-CM

## 2019-07-08 DIAGNOSIS — R262 Difficulty in walking, not elsewhere classified: Secondary | ICD-10-CM

## 2019-07-08 DIAGNOSIS — M6281 Muscle weakness (generalized): Secondary | ICD-10-CM

## 2019-07-08 DIAGNOSIS — M79672 Pain in left foot: Secondary | ICD-10-CM

## 2019-07-08 NOTE — Therapy (Signed)
[facility] [address] [place], [address] Phone: [phone]   Fax:  [phone]  Physical Therapy Treatment  Patient Details  Name: Angel [name] MRN: [id number] Date of Birth: 1931/04/04 Referring Provider (PT): Delsa Grana, [place]   Encounter Date: 07/08/2019  PT End of Session - 07/08/19 1305    Visit Number  35    Number of Visits  16    Date for PT Re-Evaluation  07/25/19    Authorization Type  5    Authorization Time Period  10    PT Start Time  1305    PT Stop Time  1349    PT Time Calculation (min)  44 min    Activity Tolerance  Patient tolerated treatment well    Behavior During Therapy  [facility] for tasks assessed/performed       Past Medical History:  Diagnosis Date  . Anxiety   . Aortic aneurysm without rupture ([employer])   . CAD (coronary artery disease)   . Chronic hip pain   . Endometriosis   . Hearing loss   . HTN, goal below 150/90   . Hyperlipidemia LDL goal <70 11/18/2014  . Osteopenia of the elderly   . Urge and stress incontinence     Past Surgical History:  Procedure Laterality Date  . ABDOMINAL HYSTERECTOMY  1976   total  . APPENDECTOMY    . BREAST EXCISIONAL BIOPSY Left 1987   benign    There were no vitals filed for this visit.  Subjective Assessment - 07/08/19 1305    Subjective  Knees are alright. The rockerboard came. Used it and it helps enourmously.    Pertinent History  B knee pain since a year or more. Walking up the steps bothers her. Walking around regularly is no pain. Pt lives in a 2 story home, first floor set up. 14 steps to get to second floor, L banister rail starting a third of the way up.  No steps to enter front and back door.  Gradual onset of B knee pain. Has been to Dr. Marry Guan who did x-rays who said that there is still cartilage in both knees. Was diagnosed with osteoarthritis.  [place]  Walking up hills bothers her knees.  Pt also walks about 20 minutes a day walking her dog.  R knee feels fragile when she starts with it to step up a step therefore she starts with her L, then she can step up with her R LE. Also goes to a chiropracter for adjustments.  Pt also has 5 step to go down to her meditation room, R rail assist in her home.  Has not yet used ice for her knees.   Pt adds that she has B foot pronation and plantar arch pain in which her orthotics that she wears helps her a lot.     Currently in Pain?  No/denies    Pain Score  0-No pain                               PT Education - 07/08/19 1308    Education Details  ther-ex    Person(s) Educated  Patient    Methods  Explanation;Demonstration;Tactile cues;Verbal cues    Comprehension  Returned demonstration;Verbalized understanding      Objective  MedbridgeAccess Code: [email or web address]  No latex band allergies Blood pressure is controlled per pt.  Aortic aneurism osteopenia  Pt demonstrates B foot pronation. Pt states wearing orthotics  Pt states that she got a letter from Dr. Sanda Klein stating that she is leaving the practice due to health issues. Pt was recommended to see their nurse practitioner [name]   Pt states that getting up from the couch is hard for her.     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 Good glute med muscle use felt by pt.   R 10x3  L 10x3  Standing hip extension with yellow band  around ankles, B UE assist to promote glute max strength, cues for low back discomfort-free range of movement  R 10x  L 10x   Improved exercise technique, movement at target joints, use of target muscles after mod verbal, visual, tactile cues.      Manual therapy   Seated STM B lateral hamstrings, bilateral medialdistal thighs,IT band, vastus lateralis muscles to decrease tension      Response to treatment  Good muscle use felt with exercises.Pt states B knees and feet feel fine after session.    Clinical impression Pt returns to PT today without complain of knee or ankle pain. Able to utilize her rockerboard at home to provide a good stretch to her gastroc/soleus muscles to help decrease tension around her knees and help decrease foot pronation. Continued working on closed chain LE strengthening with emphasis on femoral control on uneven surface to promote stability as well. Also continued working on glute med and max strengthening to promote proper mechanics at her knee joints and ankle joints when performing standing tasks such as ambulation or stair negotiation. Pt tolerated session well without aggravation of symptoms. Pt will benefit from continued skilled physical therapy services to decrease pain, improve strength, and function.    PT Short Term Goals - 09/20/18 1905      PT SHORT TERM GOAL #1   Title  Patient will be independent with her HEP to decrease pain, improve strength and function.     Time  3    Period  Weeks    Status  On-going    Target Date  10/11/18        PT Long Term Goals - 06/12/19 0958      PT LONG TERM GOAL #1   Title  Patient will have a decrease in B knee pain to 2/10 or less at worst to decrease difficulty negotiating stairs or climbing up hills.     Baseline  R knee 5/10 , L knee 4.5/10 at worst for the past 3 months (07/16/2018); 6/10 R knee pain at most, 4.5/10 L knee pain at most for the past 7 days (09/20/2018); 3/10 B knee  pain at worst for the past 7 days (09/27/2018); 3/10 B knee pain at worst for the past 2 weeks (12/25/2018). 1.5/10 R knee pain (except with stairs), 1.5/10 L knee pain at most for the past 7 days except with steps/stairs (01/22/2019); 3.5/10 for both knees at most for the past 7 days going up and down steps. 2/10 at most B knees without the stairs (02/07/2019); 2.5/10 B knee pain at most for the past 7 days including stairs (03/12/2019); 3.5/10 B knee pain at most for the past 3 weeks (05/08/2019);  2/10 B knee pain at most for the past 7 days (06/12/2019)    Time  6    Period  Weeks    Status  Partially Met    Target Date  07/25/19      PT LONG TERM GOAL #2   Title  Patient will be able to ascend and descend 4 regular steps with bilateral UE assist at least 2 x with minimal to no complain of B knee pain.     Baseline  Increased B knee pain with ascending and descending 4 regular steps with B UE assist (07/16/2018). Still has pain but feels slight improvement (09/20/2018); increased B knee pain with stairs (12/25/2018), (01/22/2019); Minimal to no complain of B knee pain (03/12/2019), minimal pain but her steps at home are higher per pt (06/12/2019)    Time  6    Period  Weeks    Status  Partially Met    Target Date  07/25/19      PT LONG TERM GOAL #3   Title  Patient will improve B hip extension and abduction strengh by at least 1/2 MMT to promote ability to negotiate stairs and climb hills with less pain.     Time  6    Period  Weeks    Status  Partially Met    Target Date  07/25/19      PT LONG TERM GOAL #4   Title  Patient will improve her 6 minute walk distance by at least 100 ft to promote mobility.     Baseline  1045 ft with L lateral hip pain/discomfort (07/16/2018); 1082 ft (09/20/2018); 910 ft (12/25/2018); 1008 ft, no pain or discomfort (01/22/2019); 1063 ft (02/07/2019); 985 ft  (03/12/19); 1185 ft (05/08/2019)    Time  6    Period  Weeks    Status  Achieved      PT LONG TERM GOAL #5   Title   Patient will have a decrease in R and L foot pain to 2/10 or less at worst to promote ability to ambulate with less difficulty.     Baseline  5/10 R foot pain, 3/10 L foot pain at most for the past 2 weeks (12/25/2018); 3.5/10 R foot and L foot pain at most for the past 7 days (01/22/2019); 2.5/10 B foot pain at most for the past 7 days (02/07/2019); 4/10 at most R foot for the past 7 days (03/12/2019); 4/10 B foot pain at most for the past 3 weeks (05/08/2019); 3/10 B foot pain (at the arch area) at most for the past 7 days (06/12/2019)    Time  6    Period  Weeks    Status  Partially Met    Target Date  07/25/19            Plan - 07/08/19 1308    Clinical Impression Statement  Pt returns to PT today without complain of knee or ankle pain.  Able to utilize her rockerboard at home to provide a good stretch to her gastroc/soleus muscles to help decrease tension around her knees and help decrease foot pronation. Continued working on closed chain LE strengthening with emphasis on femoral control on uneven surface to promote stability as well. Also continued working on glute med and max strengthening to promote proper mechanics at her knee joints and ankle joints when performing standing tasks such as ambulation or stair negotiation. Pt tolerated session well without aggravation of symptoms. Pt will benefit from continued skilled physical therapy services to decrease pain, improve strength, and function.    Personal Factors and Comorbidities  Age;Comorbidity 2;Fitness;Time since onset of injury/illness/exacerbation    Comorbidities  Osteopenia, aortic anneurism    Examination-Activity Limitations  Stairs    Stability/Clinical Decision Making  Stable/Uncomplicated    Rehab Potential  Fair    Clinical Impairments Affecting Rehab Potential  age, work schedule    PT Frequency  2x / week    PT Duration  6 weeks    PT Treatment/Interventions  Aquatic Therapy;Electrical Stimulation;Iontophoresis 51m/ml  Dexamethasone;Gait training;Stair training;Therapeutic activities;Therapeutic exercise;Balance training;Neuromuscular re-education;Patient/family education;Manual techniques;Dry needling    PT Next Visit Plan  hip strengthening, femoral control, manual techniques, modalities PRN    PT Home Exercise Plan  Medbridge Access code [phone]   Consulted and Agree with Plan of Care  Patient       Patient will benefit from skilled therapeutic intervention in order to improve the following deficits and impairments:  Pain, Postural dysfunction, Improper body mechanics, Decreased strength, Decreased range of motion  Visit Diagnosis: Difficulty in walking, not elsewhere classified  Chronic pain of left knee  Chronic pain of right knee  Pain in right foot  Pain in left foot  Muscle weakness (generalized)     Problem List Patient Active Problem List   Diagnosis Date Noted  . Arthritis of knee, degenerative 05/22/2018  . Cataract, right eye 05/17/2018  . Aortic stenosis, mild 02/22/2018  . Obesity (BMI 30.0-34.9) 01/18/2018  . Hip pain, chronic 04/02/2015  . Hearing loss of both ears 04/02/2015  . Osteopenia of the elderly 04/02/2015  . Hormone replacement therapy (postmenopausal) 04/02/2015  . [place] 04/02/2015  . Situational anxiety 04/02/2015  . Hyperlipidemia LDL goal <70 11/18/2014  . Mixed stress and urge urinary incontinence 06/29/2014  . Hypertension goal BP (blood pressure) < 140/90 11/06/2013  . CAD (coronary artery disease) 11/06/2013    MJoneen BoersPT, DPT   07/08/2019, 2:09 PM  [facility] [address] [place] [address]Phone: [phone]  Fax:  [phone] Name: Angel BEIRNEMRN: [id number] of Birth: 406/27/1932

## 2019-07-10 ENCOUNTER — Ambulatory Visit: Payer: Medicare Other | Attending: Family Medicine

## 2019-07-10 ENCOUNTER — Other Ambulatory Visit: Payer: Self-pay

## 2019-07-10 DIAGNOSIS — G8929 Other chronic pain: Secondary | ICD-10-CM | POA: Insufficient documentation

## 2019-07-10 DIAGNOSIS — M79672 Pain in left foot: Secondary | ICD-10-CM | POA: Diagnosis present

## 2019-07-10 DIAGNOSIS — M25561 Pain in right knee: Secondary | ICD-10-CM | POA: Diagnosis present

## 2019-07-10 DIAGNOSIS — M25562 Pain in left knee: Secondary | ICD-10-CM | POA: Diagnosis present

## 2019-07-10 DIAGNOSIS — M6281 Muscle weakness (generalized): Secondary | ICD-10-CM | POA: Diagnosis present

## 2019-07-10 DIAGNOSIS — R262 Difficulty in walking, not elsewhere classified: Secondary | ICD-10-CM | POA: Diagnosis not present

## 2019-07-10 DIAGNOSIS — M79671 Pain in right foot: Secondary | ICD-10-CM | POA: Insufficient documentation

## 2019-07-10 NOTE — Therapy (Signed)
[place] [facility] [address] [place], [address] Phone: [phone]   Fax:  [phone]  Physical Therapy Treatment  Patient Details  Name: Angel [name] MRN: [id number] Date of Birth: 1930/11/05 Referring Provider (PT): Delsa Grana, [place]   Encounter Date: 07/10/2019  PT End of Session - 07/10/19 1435    Visit Number  36    Number of Visits  6    Date for PT Re-Evaluation  07/25/19    Authorization Type  6    Authorization Time Period  10    PT Start Time  1435    PT Stop Time  1523    PT Time Calculation (min)  48 min    Activity Tolerance  Patient tolerated treatment well    Behavior During Therapy  [facility] for tasks assessed/performed       Past Medical History:  Diagnosis Date  . Anxiety   . Aortic aneurysm without rupture ([employer])   . CAD (coronary artery disease)   . Chronic hip pain   . Endometriosis   . Hearing loss   . HTN, goal below 150/90   . Hyperlipidemia LDL goal <70 11/18/2014  . Osteopenia of the elderly   . Urge and stress incontinence     Past Surgical History:  Procedure Laterality Date  . ABDOMINAL HYSTERECTOMY  1976   total  . APPENDECTOMY    . BREAST EXCISIONAL BIOPSY Left 1987   benign    There were no vitals filed for this visit.  Subjective Assessment - 07/10/19 1436    Subjective  Knees are ok. Both feet are 2.5/10 currently. Does her rockerboard exercise in the morning which is very helpful. Knees feel better with stair negotiation at home with the 2nd rail added.    Pertinent History  B knee pain since a year or more. Walking up the steps bothers her. Walking around regularly is no pain. Pt lives in a 2 story home, first floor set up. 14 steps to get to second floor, L banister rail starting a third of the way up.  No steps to enter front and back door.  Gradual onset of B knee pain. Has been to Dr. Marry Guan who did x-rays who said that there is still cartilage in both  knees. Was diagnosed with osteoarthritis.  [place]. Walking up hills bothers her knees.  Pt also walks about 20 minutes a day walking her dog.  R knee feels fragile when she starts with it to step up a step therefore she starts with her L, then she can step up with her R LE. Also goes to a chiropracter for adjustments.  Pt also has 5 step to go down to her meditation room, R rail assist in her home.  Has not yet used ice for her knees.   Pt adds that she has B foot pronation and plantar arch pain in which her orthotics that she wears helps her a lot.     Currently in Pain?  Yes    Pain Score  3    bilateral lateral ankle pain                              PT Education - 07/10/19 1442    Education Details  ther-ex    [employer]) Educated  Patient  Methods  Explanation;Demonstration;Tactile cues;Verbal cues    Comprehension  Returned demonstration;Verbalized understanding      Objective  MedbridgeAccess Code: [email or web address]   No latex band allergies Blood pressure is controlled per pt.  Aortic aneurism osteopenia  Pt demonstrates B foot pronation. Pt states wearing orthotics  Pt states that she got a letter from Dr. Sanda Klein stating that she is leaving the practice due to health issues. Pt was recommended to see their nurse practitioner [name]   Pt states that getting up from the couch is hard for her.     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   Decreased plantar foot pain with gait  Forward step up onto Air Ex pad with one UE light touch assist, cues for femoral control   R 10x2  L 10x2  Lateral step up onto Air Ex pad with B UE assist cues for femoral control   R 10x2  L 10x 2   Air Ex exercises performed to promote LE stability, strength, balance, and improve knee mechanics  No pain with gait afterwards   Improved exercise technique, movement at target joints, use of target muscles after mod verbal, visual, tactile cues.     Manual therapy  Seated STM B lateral hamstrings, bilateral medialdistal thighs,IT band, vastus lateralismusclesto decrease tension and improve mechanics in her knees     Response to treatment Good muscle use felt with exercises.Pt states B knees and feet feel better after session.   Clinical impression Pt arrived today with complain of bilateral lateral ankle pain. Worked on decreasing lateral ankle pressure, improving femoral control, glute strengthening to help decrease/prevent genu valgus and subsequent increase in B foot pronation and therefore lateral ankle pressure. Improved comfort level with gait after session reported by pt. Pt will benefit from continued skilled physical therapy services to decrease pain, improve strength, and ability to ambulate and negotiate stairs more comfortably. Improved comfort level with stair negotiation at home with addition of 2nd rail for B UE assist and decrease stress to both knees and ankles.     PT Short Term Goals - 09/20/18 1905      PT SHORT TERM GOAL #1   Title  Patient will be independent with her HEP to decrease pain, improve strength and function.     Time  3    Period  Weeks    Status  On-going    Target Date  10/11/18        PT Long Term Goals - 06/12/19 0958      PT LONG TERM GOAL #1   Title  Patient will have a decrease in B knee pain to 2/10 or  less at worst to decrease difficulty negotiating stairs or climbing up hills.     Baseline  R knee 5/10 , L knee 4.5/10 at worst for the past 3 months (07/16/2018); 6/10 R knee pain at most, 4.5/10 L knee pain at most  for the past 7 days (09/20/2018); 3/10 B knee pain at worst for the past 7 days (09/27/2018); 3/10 B knee pain at worst for the past 2 weeks (12/25/2018). 1.5/10 R knee pain (except with stairs), 1.5/10 L knee pain at most for the past 7 days except with steps/stairs (01/22/2019); 3.5/10 for both knees at most for the past 7 days going up and down steps. 2/10 at most B knees without the stairs (02/07/2019); 2.5/10 B knee pain at most for the past 7 days including stairs (03/12/2019); 3.5/10 B knee pain at most for the past 3 weeks (05/08/2019);  2/10 B knee pain at most for the past 7 days (06/12/2019)    Time  6    Period  Weeks    Status  Partially Met    Target Date  07/25/19      PT LONG TERM GOAL #2   Title  Patient will be able to ascend and descend 4 regular steps with bilateral UE assist at least 2 x with minimal to no complain of B knee pain.     Baseline  Increased B knee pain with ascending and descending 4 regular steps with B UE assist (07/16/2018). Still has pain but feels slight improvement (09/20/2018); increased B knee pain with stairs (12/25/2018), (01/22/2019); Minimal to no complain of B knee pain (03/12/2019), minimal pain but her steps at home are higher per pt (06/12/2019)    Time  6    Period  Weeks    Status  Partially Met    Target Date  07/25/19      PT LONG TERM GOAL #3   Title  Patient will improve B hip extension and abduction strengh by at least 1/2 MMT to promote ability to negotiate stairs and climb hills with less pain.     Time  6    Period  Weeks    Status  Partially Met    Target Date  07/25/19      PT LONG TERM GOAL #4   Title  Patient will improve her 6 minute walk distance by at least 100 ft to promote mobility.     Baseline  1045 ft with L lateral hip  pain/discomfort (07/16/2018); 1082 ft (09/20/2018); 910 ft (12/25/2018); 1008 ft, no pain or discomfort (01/22/2019); 1063 ft (02/07/2019); 985 ft  (03/12/19); 1185 ft (05/08/2019)    Time  6    Period  Weeks    Status  Achieved      PT LONG TERM GOAL #5   Title  Patient will have a decrease in R and L foot pain to 2/10 or less at worst to promote ability to ambulate with less difficulty.     Baseline  5/10 R foot pain, 3/10 L foot pain at most for the past 2 weeks (12/25/2018); 3.5/10 R foot and L foot pain at most for the past 7 days (01/22/2019); 2.5/10 B foot pain at most for the past 7 days (02/07/2019); 4/10 at most R foot for the past 7 days (03/12/2019); 4/10 B foot pain at most for the past 3 weeks (05/08/2019); 3/10 B foot pain (at the arch area) at most for the past 7 days (06/12/2019)    Time  6    Period  Weeks    Status  Partially Met    Target Date  07/25/19            Plan - 07/10/19 1442    Clinical Impression Statement  Pt arrived today  with complain of bilateral lateral ankle pain. Worked on decreasing lateral ankle pressure, improving femoral control, glute strengthening to help decrease/prevent genu valgus and subsequent increase in B foot pronation and therefore lateral ankle pressure. Improved comfort level with gait after session reported by pt. Pt will benefit from continued skilled physical therapy services to decrease pain, improve strength, and ability to ambulate and negotiate stairs more comfortably. Improved comfort level with stair negotiation at home with addition of 2nd rail for B UE assist and decrease stress to both knees and ankles.    Personal Factors and Comorbidities  Age;Comorbidity 2;Fitness;Time since onset of injury/illness/exacerbation    Comorbidities  Osteopenia, aortic anneurism    Examination-Activity Limitations  Stairs    Stability/Clinical Decision Making  Stable/Uncomplicated    Rehab Potential  Fair    Clinical Impairments Affecting Rehab Potential   age, work schedule    PT Frequency  2x / week    PT Duration  6 weeks    PT Treatment/Interventions  Aquatic Therapy;Electrical Stimulation;Iontophoresis 25m/ml Dexamethasone;Gait training;Stair training;Therapeutic activities;Therapeutic exercise;Balance training;Neuromuscular re-education;Patient/family education;Manual techniques;Dry needling    PT Next Visit Plan  hip strengthening, femoral control, manual techniques, modalities PRN    PT Home Exercise Plan  Medbridge Access code [phone]   Consulted and Agree with Plan of Care  Patient       Patient will benefit from skilled therapeutic intervention in order to improve the following deficits and impairments:  Pain, Postural dysfunction, Improper body mechanics, Decreased strength, Decreased range of motion  Visit Diagnosis: Difficulty in walking, not elsewhere classified  Chronic pain of left knee  Chronic pain of right knee  Pain in right foot  Pain in left foot  Muscle weakness (generalized)     Problem List Patient Active Problem List   Diagnosis Date Noted  . Arthritis of knee, degenerative 05/22/2018  . Cataract, right eye 05/17/2018  . Aortic stenosis, mild 02/22/2018  . Obesity (BMI 30.0-34.9) 01/18/2018  . Hip pain, chronic 04/02/2015  . Hearing loss of both ears 04/02/2015  . Osteopenia of the elderly 04/02/2015  . Hormone replacement therapy (postmenopausal) 04/02/2015  . [place] 04/02/2015  . Situational anxiety 04/02/2015  . Hyperlipidemia LDL goal <70 11/18/2014  . Mixed stress and urge urinary incontinence 06/29/2014  . Hypertension goal BP (blood pressure) < 140/90 11/06/2013  . CAD (coronary artery disease) 11/06/2013    MJoneen BoersPT, DPT   07/10/2019, 3:42 PM  [facility] [address] [place] [address]Phone: [phone]  Fax:  [phone] Name: Angel DESANCTISMRN:  [id number] of Birth: [birth year]

## 2019-07-15 ENCOUNTER — Other Ambulatory Visit: Payer: Self-pay

## 2019-07-15 ENCOUNTER — Ambulatory Visit: Payer: Medicare Other

## 2019-07-15 DIAGNOSIS — R262 Difficulty in walking, not elsewhere classified: Secondary | ICD-10-CM

## 2019-07-15 DIAGNOSIS — M25561 Pain in right knee: Secondary | ICD-10-CM

## 2019-07-15 DIAGNOSIS — M79672 Pain in left foot: Secondary | ICD-10-CM

## 2019-07-15 DIAGNOSIS — G8929 Other chronic pain: Secondary | ICD-10-CM

## 2019-07-15 DIAGNOSIS — M6281 Muscle weakness (generalized): Secondary | ICD-10-CM

## 2019-07-15 DIAGNOSIS — M25562 Pain in left knee: Secondary | ICD-10-CM

## 2019-07-15 DIAGNOSIS — M79671 Pain in right foot: Secondary | ICD-10-CM

## 2019-07-15 NOTE — Therapy (Signed)
[facility] [address], [place], [address] Phone: [phone]   Fax:  [phone]  Physical Therapy Treatment  Patient Details  Name: Angel [name] MRN: [id number] Date of Birth: Feb 23, 1931 Referring Provider (PT): Delsa Grana, [place]   Encounter Date: 07/15/2019  PT End of Session - 07/15/19 1312    Visit Number  37    Number of Visits  22    Date for PT Re-Evaluation  07/25/19    Authorization Type  7    Authorization Time Period  10    PT Start Time  2774   pt arrived late   PT Stop Time  1353    PT Time Calculation (min)  40 min    Activity Tolerance  Patient tolerated treatment well    Behavior During Therapy  [facility] for tasks assessed/performed       Past Medical History:  Diagnosis Date  . Anxiety   . Aortic aneurysm without rupture ([place])   . CAD (coronary artery disease)   . Chronic hip pain   . Endometriosis   . Hearing loss   . HTN, goal below 150/90   . Hyperlipidemia LDL goal <70 11/18/2014  . Osteopenia of the elderly   . Urge and stress incontinence     Past Surgical History:  Procedure Laterality Date  . ABDOMINAL HYSTERECTOMY  1976   total  . APPENDECTOMY    . BREAST EXCISIONAL BIOPSY Left 1987   benign    There were no vitals filed for this visit.  Subjective Assessment - 07/15/19 1313    Subjective  Knees and ankles are doing alright. No pain currently.    Pertinent History  B knee pain since a year or more. Walking up the steps bothers her. Walking around regularly is no pain. Pt lives in a 2 story home, first floor set up. 14 steps to get to second floor, L banister rail starting a third of the way up.  No steps to enter front and back door.  Gradual onset of B knee pain. Has been to Dr. Marry Guan who did x-rays who said that there is still cartilage in both knees. Was diagnosed with osteoarthritis.  [place]  Walking up hills bothers her knees.  Pt also walks about 20 minutes a day walking her dog.  R knee feels fragile when she starts with it to step up a step therefore she starts with her L, then she can step up with her R LE. Also goes to a chiropracter for adjustments.  Pt also has 5 step to go down to her meditation room, R rail assist in her home.  Has not yet used ice for her knees.   Pt adds that she has B foot pronation and plantar arch pain in which her orthotics that she wears helps her a lot.     Currently in Pain?  No/denies    Pain Score  0-No pain                               PT Education - 07/15/19 1319    Education Details  ther-ex    Person(s) Educated  Patient    Methods  Explanation;Demonstration;Tactile cues;Verbal cues    Comprehension  Returned demonstration;Verbalized understanding      Objective  MedbridgeAccess Code: [email or web address]  No latex band allergies Blood pressure is controlled per pt.  Aortic aneurism osteopenia  Pt demonstrates B foot pronation. Pt states wearing orthotics  Pt states that she got a letter from Dr. Sanda Klein stating that she is leaving the practice due to health issues. Pt was recommended to see their nurse practitioner [name]   Pt states that getting up from the couch is hard for her.         Therapeutic exercise   Forward step up onto Air Ex pad with one UE light touch assist, cues for femoral control              R 10x2             L 10x2  Lateral step up onto Air Ex pad with B UE assist cues for femoral control              R 10x2             L 10x 2  Standing gastroc stretch at first stair step with B UE assist 30 seconds x 3  standing hip abduction resisting yellow band around ankles with B UE assist to promote glute med muscle use  R 10x3  L 10x3   Standing hip extension yellow band around ankles with B UE assist to promote glute max muscle use   R 10x3  L  10x3  Seated hip adduction pillow squeeze 10x2 with 10 second holds to decrease piriformis muscle activation on sciatic nerve.          Seated manually resisted hip extension with PT to promote glute max muscle strengthening  R 10x3  L 10x 3               Air Ex exercises performed to promote LE stability, strength, balance, and improve knee mechanics   Improved exercise technique, movement at target joints, use of target muscles after mod verbal, visual, tactile cues.     Manual therapy  Seated STM B lateral hamstrings, bilateral medialdistal thighs,IT band, vastus lateralismusclesto decrease tension and improve mechanics in her knees     Response to treatment Good muscle use felt with exercises.Pt states B knees and feet feel better after session.   Clinical impression Pt returns to PT without complain of B knee or ankle pain. Improved carry over of decreased symptoms with addition of 2nd rail at stairs at home and use of rocker board at home to decrease LE tension based on pt reports. Continued working on [employer], and max muscle strengthening as well as decreasing soft tissue tension at B knees to help promote proper LE mechanics when performing standing tasks and decrease overall knee and ankle pain. No complain of pain throughout session. Pt will benefit from continued skilled physical therapy services to decrease pain, improve strength, and function.        PT Short Term Goals - 09/20/18 1905      PT SHORT TERM GOAL #1   Title  Patient will be independent with her HEP to decrease pain, improve strength and function.     Time  3    Period  Weeks    Status  On-going    Target Date  10/11/18        PT Long Term Goals - 06/12/19 0958      PT LONG TERM GOAL #1   Title  Patient will have a decrease in B knee pain to 2/10 or less at  worst to decrease difficulty negotiating stairs or climbing up hills.     Baseline  R knee 5/10 , L knee 4.5/10  at worst for the past 3 months (07/16/2018); 6/10 R knee pain at most, 4.5/10 L knee pain at most for the past 7 days (09/20/2018); 3/10 B knee pain at worst for the past 7 days (09/27/2018); 3/10 B knee pain at worst for the past 2 weeks (12/25/2018). 1.5/10 R knee pain (except with stairs), 1.5/10 L knee pain at most for the past 7 days except with steps/stairs (01/22/2019); 3.5/10 for both knees at most for the past 7 days going up and down steps. 2/10 at most B knees without the stairs (02/07/2019); 2.5/10 B knee pain at most for the past 7 days including stairs (03/12/2019); 3.5/10 B knee pain at most for the past 3 weeks (05/08/2019);  2/10 B knee pain at most for the past 7 days (06/12/2019)    Time  6    Period  Weeks    Status  Partially Met    Target Date  07/25/19      PT LONG TERM GOAL #2   Title  Patient will be able to ascend and descend 4 regular steps with bilateral UE assist at least 2 x with minimal to no complain of B knee pain.     Baseline  Increased B knee pain with ascending and descending 4 regular steps with B UE assist (07/16/2018). Still has pain but feels slight improvement (09/20/2018); increased B knee pain with stairs (12/25/2018), (01/22/2019); Minimal to no complain of B knee pain (03/12/2019), minimal pain but her steps at home are higher per pt (06/12/2019)    Time  6    Period  Weeks    Status  Partially Met    Target Date  07/25/19      PT LONG TERM GOAL #3   Title  Patient will improve B hip extension and abduction strengh by at least 1/2 MMT to promote ability to negotiate stairs and climb hills with less pain.     Time  6    Period  Weeks    Status  Partially Met    Target Date  07/25/19      PT LONG TERM GOAL #4   Title  Patient will improve her 6 minute walk distance by at least 100 ft to promote mobility.     Baseline  1045 ft with L lateral hip pain/discomfort (07/16/2018); 1082 ft (09/20/2018); 910 ft (12/25/2018); 1008 ft, no pain or discomfort (01/22/2019); 1063 ft  (02/07/2019); 985 ft  (03/12/19); 1185 ft (05/08/2019)    Time  6    Period  Weeks    Status  Achieved      PT LONG TERM GOAL #5   Title  Patient will have a decrease in R and L foot pain to 2/10 or less at worst to promote ability to ambulate with less difficulty.     Baseline  5/10 R foot pain, 3/10 L foot pain at most for the past 2 weeks (12/25/2018); 3.5/10 R foot and L foot pain at most for the past 7 days (01/22/2019); 2.5/10 B foot pain at most for the past 7 days (02/07/2019); 4/10 at most R foot for the past 7 days (03/12/2019); 4/10 B foot pain at most for the past 3 weeks (05/08/2019); 3/10 B foot pain (at the arch area) at most for the past 7 days (06/12/2019)    Time  6  Period  Weeks    Status  Partially Met    Target Date  07/25/19            Plan - 07/15/19 1319    Clinical Impression Statement  Pt returns to PT without complain of B knee or ankle pain. Improved carry over of decreased symptoms with addition of 2nd rail at stairs at home and use of rocker board at home to decrease LE tension based on pt reports. Continued working on [employer], and max muscle strengthening as well as decreasing soft tissue tension at B knees to help promote proper LE mechanics when performing standing tasks and decrease overall knee and ankle pain. No complain of pain throughout session. Pt will benefit from continued skilled physical therapy services to decrease pain, improve strength, and function.    Personal Factors and Comorbidities  Age;Comorbidity 2;Fitness;Time since onset of injury/illness/exacerbation    Comorbidities  Osteopenia, aortic anneurism    Examination-Activity Limitations  Stairs    Stability/Clinical Decision Making  Stable/Uncomplicated    Rehab Potential  Fair    Clinical Impairments Affecting Rehab Potential  age, work schedule    PT Frequency  2x / week    PT Duration  6 weeks    PT Treatment/Interventions  Aquatic Therapy;Electrical Stimulation;Iontophoresis 80m/ml  Dexamethasone;Gait training;Stair training;Therapeutic activities;Therapeutic exercise;Balance training;Neuromuscular re-education;Patient/family education;Manual techniques;Dry needling    PT Next Visit Plan  hip strengthening, femoral control, manual techniques, modalities PRN    PT Home Exercise Plan  Medbridge Access code [phone]   Consulted and Agree with Plan of Care  Patient       Patient will benefit from skilled therapeutic intervention in order to improve the following deficits and impairments:  Pain, Postural dysfunction, Improper body mechanics, Decreased strength, Decreased range of motion  Visit Diagnosis: Difficulty in walking, not elsewhere classified  Chronic pain of left knee  Chronic pain of right knee  Pain in right foot  Pain in left foot  Muscle weakness (generalized)     Problem List Patient Active Problem List   Diagnosis Date Noted  . Arthritis of knee, degenerative 05/22/2018  . Cataract, right eye 05/17/2018  . Aortic stenosis, mild 02/22/2018  . Obesity (BMI 30.0-34.9) 01/18/2018  . Hip pain, chronic 04/02/2015  . Hearing loss of both ears 04/02/2015  . Osteopenia of the elderly 04/02/2015  . Hormone replacement therapy (postmenopausal) 04/02/2015  . [place] rupture [place] 04/02/2015  . Situational anxiety 04/02/2015  . Hyperlipidemia LDL goal <70 11/18/2014  . Mixed stress and urge urinary incontinence 06/29/2014  . Hypertension goal BP (blood pressure) < 140/90 11/06/2013  . CAD (coronary artery disease) 11/06/2013    MJoneen BoersPT, DPT  07/15/2019, 2:06 PM  [facility] [address] [place] [address]Phone: [phone]  Fax:  [phone] Name: CESTHER BRADSTREETMRN: [id number] of Birth: [birth year]

## 2019-07-17 ENCOUNTER — Ambulatory Visit: Payer: Medicare Other

## 2019-07-22 ENCOUNTER — Ambulatory Visit: Payer: Medicare Other

## 2019-07-24 ENCOUNTER — Ambulatory Visit: Payer: Medicare Other

## 2019-07-29 ENCOUNTER — Other Ambulatory Visit: Payer: Self-pay

## 2019-07-29 ENCOUNTER — Ambulatory Visit: Payer: Medicare Other

## 2019-07-29 DIAGNOSIS — R262 Difficulty in walking, not elsewhere classified: Secondary | ICD-10-CM

## 2019-07-29 DIAGNOSIS — M79672 Pain in left foot: Secondary | ICD-10-CM

## 2019-07-29 DIAGNOSIS — M6281 Muscle weakness (generalized): Secondary | ICD-10-CM

## 2019-07-29 DIAGNOSIS — M79671 Pain in right foot: Secondary | ICD-10-CM

## 2019-07-29 DIAGNOSIS — G8929 Other chronic pain: Secondary | ICD-10-CM

## 2019-07-29 DIAGNOSIS — M25562 Pain in left knee: Secondary | ICD-10-CM

## 2019-07-29 NOTE — Therapy (Signed)
[place] [address], [place], [address] Phone: [phone]   Fax:  [phone]  Physical Therapy Treatment And Discharge Summary  Patient Details  Name: Angel [name] MRN: [id number] Date of Birth: 1931/05/29 Referring Provider (PT): Delsa Grana, [place]   Encounter Date: 07/29/2019  PT End of Session - 07/29/19 1304    Visit Number  38    Number of Visits  74    Date for PT Re-Evaluation  07/25/19    Authorization Type  8    Authorization Time Period  10    PT Start Time  1305    PT Stop Time  1334    PT Time Calculation (min)  29 min    Activity Tolerance  Patient tolerated treatment well    Behavior During Therapy  [facility] for tasks assessed/performed       Past Medical History:  Diagnosis Date  . Anxiety   . Aortic aneurysm without rupture ([place])   . CAD (coronary artery disease)   . Chronic hip pain   . Endometriosis   . Hearing loss   . HTN, goal below 150/90   . Hyperlipidemia LDL goal <70 11/18/2014  . Osteopenia of the elderly   . Urge and stress incontinence     Past Surgical History:  Procedure Laterality Date  . ABDOMINAL HYSTERECTOMY  1976   total  . APPENDECTOMY    . BREAST EXCISIONAL BIOPSY Left 1987   benign    There were no vitals filed for this visit.  Subjective Assessment - 07/29/19 1305    Subjective  No fever or difficulty breathing. Knees are functioning today. Went up the steps at a store and had pain. No pain currently. 3.5/10 knee and ankle pain at most for the past 7 days. No knee pain but stiffness when negitiating steps at home since she now has B UE assist.  Has a little fear with stepping up onto a curb.    Pertinent History  B knee pain since a year or more. Walking up the steps bothers her. Walking around regularly is no pain. Pt lives in a 2 story home, first floor set up. 14 steps to get to second floor, L banister rail starting a third of the way up.  No  steps to enter front and back door.  Gradual onset of B knee pain. Has been to Dr. Marry Guan who did x-rays who said that there is still cartilage in both knees. Was diagnosed with osteoarthritis.  [place]. Walking up hills bothers her knees.  Pt also walks about 20 minutes a day walking her dog.  R knee feels fragile when she starts with it to step up a step therefore she starts with her L, then she can step up with her R LE. Also goes to a chiropracter for adjustments.  Pt also has 5 step to go down to her meditation room, R rail assist in her home.  Has not yet used ice for her knees.   Pt adds that she has B foot pronation and plantar arch pain in which her orthotics that she wears helps her a lot.     Currently in Pain?  No/denies         [facility] PT Assessment - 07/29/19 0001      Strength   Right Hip Extension  5/5   seated manually resisted  hip extension   Right Hip ABduction  5/5   seated manually resisted clamshell   Left Hip Extension  5/5   seated manually resisted hip extension   Left Hip ABduction  5/5   seated manually resisted clamshell                          PT Education - 07/29/19 1344    Education Details  ther-ex, plan of care    Person(s) Educated  Patient    Methods  Explanation;Demonstration;Tactile cues;Verbal cues    Comprehension  Returned demonstration;Verbalized understanding       Objective  MedbridgeAccess Code: [email or web address]   No latex band allergies Blood pressure is controlled per pt.  Aortic aneurism osteopenia  Pt demonstrates B foot pronation. Pt states wearing orthotics  Pt states that she got a letter from Dr. Sanda Klein stating that she is leaving the practice due to health issues. Pt was recommended to see their nurse practitioner [name]   Pt states that getting up from the couch is hard for her.         Therapeutic  exercise  Seated manually resisted hip extension and clamshell isometrics 1x each way for each LE  Reviewed progress with strength with pt   Stepping up onto a curb   R LE 10x  L LE 10x2. Some unsteadiness when stepping back down with R LE to return to starting position.   Pt demonstrated B glute med muscle weakness in closed chain position.   Moderate R knee pain.   Ascending and descending 4 regular steps with B UE assist 3x  Min to moderated B knee pain   Reviewed progrss/current status with PT towards goals.   No questions with HEP per pt.    Reviewed POC  Continued with HEP for 2-3 months and   Improved exercise technique, movement at target joints, use of target muscles after mod verbal, visual, tactile cues.      Response to treatment Good muscle use felt with exercises.Pt states B knees and feet feelbetterafter session.   Clinical impression Pt demonstrates overall decreased in B knee and ankle pain, improved hip strength and ability to negotiate stairs with less difficulty and more comfort since initial evaluation. Pt also demonstrates independence with her HEP. Skilled physical therapy services discharged with pt continuing her progress with her exercises at home.        PT Short Term Goals - 07/29/19 1344      PT SHORT TERM GOAL #1   Title  Patient will be independent with her HEP to decrease pain, improve strength and function.     Baseline  Pt states performing her HEP without questions (07/29/2019)    Time  3    Period  Weeks    Status  Achieved    Target Date  10/11/18        PT Long Term Goals - 07/29/19 1345      PT LONG TERM GOAL #1   Title  Patient will have a decrease in B knee pain to 2/10 or less at worst to decrease difficulty negotiating stairs or climbing up hills.     Baseline  R knee 5/10 , L knee 4.5/10 at worst for the past 3 months (07/16/2018); 6/10 R knee pain at most, 4.5/10 L knee pain at most for the past 7 days  (09/20/2018); 3/10 B knee pain at worst for the past 7 days (09/27/2018); 3/10  B knee pain at worst for the past 2 weeks (12/25/2018). 1.5/10 R knee pain (except with stairs), 1.5/10 L knee pain at most for the past 7 days except with steps/stairs (01/22/2019); 3.5/10 for both knees at most for the past 7 days going up and down steps. 2/10 at most B knees without the stairs (02/07/2019); 2.5/10 B knee pain at most for the past 7 days including stairs (03/12/2019); 3.5/10 B knee pain at most for the past 3 weeks (05/08/2019);  2/10 B knee pain at most for the past 7 days (06/12/2019); 3.5/10 at most for the past 7 days (07/29/2019)    Time  6    Period  Weeks    Status  Partially Met    Target Date  07/25/19      PT LONG TERM GOAL #2   Title  Patient will be able to ascend and descend 4 regular steps with bilateral UE assist at least 2 x with minimal to no complain of B knee pain.     Baseline  Increased B knee pain with ascending and descending 4 regular steps with B UE assist (07/16/2018). Still has pain but feels slight improvement (09/20/2018); increased B knee pain with stairs (12/25/2018), (01/22/2019); Minimal to no complain of B knee pain (03/12/2019), minimal pain but her steps at home are higher per pt (06/12/2019); min to mod B knee pain (07/29/2019)    Time  6    Period  Weeks    Status  Partially Met    Target Date  08/01/19      PT LONG TERM GOAL #3   Title  Patient will improve B hip extension and abduction strengh by at least 1/2 MMT to promote ability to negotiate stairs and climb hills with less pain.     Time  6    Period  Weeks    Status  Achieved    Target Date  07/25/19      PT LONG TERM GOAL #4   Title  Patient will improve her 6 minute walk distance by at least 100 ft to promote mobility.     Baseline  1045 ft with L lateral hip pain/discomfort (07/16/2018); 1082 ft (09/20/2018); 910 ft (12/25/2018); 1008 ft, no pain or discomfort (01/22/2019); 1063 ft (02/07/2019); 985 ft  (03/12/19); 1185 ft  (05/08/2019)    Time  6    Period  Weeks    Status  Achieved      PT LONG TERM GOAL #5   Title  Patient will have a decrease in R and L foot pain to 2/10 or less at worst to promote ability to ambulate with less difficulty.     Baseline  5/10 R foot pain, 3/10 L foot pain at most for the past 2 weeks (12/25/2018); 3.5/10 R foot and L foot pain at most for the past 7 days (01/22/2019); 2.5/10 B foot pain at most for the past 7 days (02/07/2019); 4/10 at most R foot for the past 7 days (03/12/2019); 4/10 B foot pain at most for the past 3 weeks (05/08/2019); 3/10 B foot pain (at the arch area) at most for the past 7 days (06/12/2019); 3.5/10 B foot pain (07/29/2019)    Time  6    Period  Weeks    Status  Partially Met    Target Date  07/25/19            Plan - 07/29/19 1304    Clinical Impression Statement  Pt demonstrates  overall decreased in B knee and ankle pain, improved hip strength and ability to negotiate stairs with less difficulty and more comfort since initial evaluation. Pt also demonstrates independence with her HEP. Skilled physical therapy services discharged with pt continuing her progress with her exercises at home.    Personal Factors and Comorbidities  Age;Comorbidity 2;Fitness;Time since onset of injury/illness/exacerbation    Comorbidities  Osteopenia, aortic anneurism    Examination-Activity Limitations  Stairs    Stability/Clinical Decision Making  Stable/Uncomplicated    Clinical Decision Making  Low    Rehab Potential  Fair    Clinical Impairments Affecting Rehab Potential  age, work schedule    PT Frequency  --    PT Duration  --    PT Treatment/Interventions  [employer];Therapeutic activities;Therapeutic exercise;Neuromuscular re-education;Patient/family education;Manual techniques    PT Next Visit Plan  Continue progress with her home exercise program    PT [employer] Access code [phone]    Consulted and Agree with Plan of Care   Patient       Patient will benefit from skilled therapeutic intervention in order to improve the following deficits and impairments:  Pain, Postural dysfunction, Improper body mechanics, Decreased strength, Decreased range of motion  Visit Diagnosis: Difficulty in walking, not elsewhere classified  Chronic pain of left knee  Chronic pain of right knee  Pain in right foot  Pain in left foot  Muscle weakness (generalized)     Problem List Patient Active Problem List   Diagnosis Date Noted  . Arthritis of knee, degenerative 05/22/2018  . Cataract, right eye 05/17/2018  . Aortic stenosis, mild 02/22/2018  . Obesity (BMI 30.0-34.9) 01/18/2018  . Hip pain, chronic 04/02/2015  . Hearing loss of both ears 04/02/2015  . Osteopenia of the elderly 04/02/2015  . Hormone replacement therapy (postmenopausal) 04/02/2015  . Aortic aneurysm without rupture ([place]) 04/02/2015  . Situational anxiety 04/02/2015  . Hyperlipidemia LDL goal <70 11/18/2014  . Mixed stress and urge urinary incontinence 06/29/2014  . Hypertension goal BP (blood pressure) < 140/90 11/06/2013  . CAD (coronary artery disease) 11/06/2013    Joneen Boers PT, DPT   07/29/2019, 3:00 PM  [facility] [address], [place], [address] Phone: [phone]   Fax:  [phone]  Name: Angel [name] MRN: [id number] Date of Birth: [birth year]

## 2019-07-31 ENCOUNTER — Ambulatory Visit: Payer: Medicare Other

## 2019-08-13 ENCOUNTER — Ambulatory Visit (INDEPENDENT_AMBULATORY_CARE_PROVIDER_SITE_OTHER): Payer: Medicare Other

## 2019-08-13 VITALS — Ht 63.0 in | Wt 181.0 lb

## 2019-08-13 DIAGNOSIS — Z0000 Encounter for general adult medical examination without abnormal findings: Secondary | ICD-10-CM | POA: Diagnosis not present

## 2019-08-13 NOTE — Progress Notes (Signed)
Subjective:   Angel [name] is a 86 y.o. female who presents for Medicare Annual (Subsequent) preventive examination.  Virtual Visit via Telephone Note  I connected with JAYAUNA CHOMA on 08/13/19 at 11:20 AM EST by telephone and verified that I am speaking with the correct person using two identifiers.  Medicare Annual Wellness visit completed telephonically due to Covid-19 pandemic.   Location: Patient: home Provider: office   I discussed the limitations, risks, security and privacy concerns of performing an evaluation and management service by telephone and the availability of in person appointments. The patient expressed understanding and agreed to proceed.  Some vital signs may be absent or patient reported.   Clemetine Marker, LPN    Review of Systems:   Cardiac Risk Factors include: advanced age (>40men, >82 women);dyslipidemia;hypertension;obesity (BMI >30kg/m2)     Objective:     Vitals: Ht 5\' 3"  (1.6 m)   Wt 181 lb (82.1 kg)   LMP  (LMP Unknown)   BMI 32.06 kg/m   Body mass index is 32.06 kg/m.  Advanced Directives 08/13/2019 06/01/2017 12/26/2016 12/02/2016 08/04/2016 01/05/2016 11/02/2015  Does Patient Have a Medical Advance Directive? Yes Yes Yes Yes Yes No Yes  Type of Paramedic of [place];Living will - - - [place];Living will - Living will  Does patient want to make changes to medical advance directive? - - - - - - No - Patient declined  Copy of [place] in Chart? No - copy requested - - - - - No - copy requested  Would patient like information on creating a medical advance directive? - - - - - No - patient declined information No - patient declined information    Tobacco Social History   Tobacco Use  Smoking Status Never Smoker  Smokeless Tobacco Never Used     Counseling given: Not Answered   Clinical Intake:  Pre-visit preparation completed: Yes  Pain : No/denies pain     BMI  - recorded: 32.06 Nutritional Status: BMI > 30  Obese Nutritional Risks: None Diabetes: No  How often do you need to have someone help you when you read instructions, pamphlets, or other written materials from your doctor or pharmacy?: 1 - Never  Interpreter Needed?: No  Information entered by :: Clemetine Marker LPN  Past Medical History:  Diagnosis Date  . Anxiety   . Aortic aneurysm without rupture ([place])   . CAD (coronary artery disease)   . Chronic hip pain   . Endometriosis   . Hearing loss   . HTN, goal below 150/90   . Hyperlipidemia LDL goal <70 11/18/2014  . Osteopenia of the elderly   . Urge and stress incontinence    Past Surgical History:  Procedure Laterality Date  . ABDOMINAL HYSTERECTOMY  1976   total  . APPENDECTOMY    . BREAST EXCISIONAL BIOPSY Left 1987   benign  . CATARACT EXTRACTION, BILATERAL  08/2018   Dr. Manuella Ghazi with [facility]   Family History  Problem Relation Age of Onset  . CVA Mother   . Stroke Mother   . Breast cancer Maternal Grandmother 60  . Heart disease Maternal Grandmother   . Cancer Brother        skin   Social History   Socioeconomic History  . Marital status: Widowed    Spouse name: Not on file  . Number of children: 3  . Years of education: Not on file  .  Highest education level: Not on file  Occupational History  . Not on file  Tobacco Use  . Smoking status: Never Smoker  . Smokeless tobacco: Never Used  Substance and Sexual Activity  . Alcohol use: Yes    Alcohol/week: 1.0 standard drinks    Types: 1 Cans of beer per week    Comment: rare  . Drug use: No  . Sexual activity: Not on file  Other Topics Concern  . Not on file  Social History Narrative  . Not on file   Social Determinants of Health   Financial Resource Strain: Low Risk   . Difficulty of Paying Living Expenses: Not hard at all  Food Insecurity: No Food Insecurity  . Worried About Charity fundraiser in the Last Year: Never true  . Ran Out  of Food in the Last Year: Never true  Transportation Needs: No Transportation Needs  . Lack of Transportation (Medical): No  . Lack of Transportation (Non-Medical): No  Physical Activity: Insufficiently Active  . Days of Exercise per Week: 7 days  . Minutes of Exercise per Session: 20 min  Stress: No Stress Concern Present  . Feeling of Stress : Only a little  Social Connections: Unknown  . Frequency of Communication with Friends and Family: Patient refused  . Frequency of Social Gatherings with Friends and Family: Patient refused  . Attends Religious Services: Patient refused  . Active Member of Clubs or Organizations: Patient refused  . Attends Archivist Meetings: Patient refused  . Marital Status: Widowed    Outpatient Encounter Medications as of 08/13/2019  Medication Sig  . amLODipine (NORVASC) 5 MG tablet TAKE 1 TABLET BY MOUTH DAILY  . Ascorbic Acid (VITAMIN C) 100 MG tablet Take 100 mg by mouth daily.  [name] atorvastatin (LIPITOR) 40 MG tablet TAKE ONE TABLET BY MOUTH EVERY NIGHT AT BEDTIME  . estrogens, conjugated, (PREMARIN) 0.3 MG tablet Take one-half of a tablet by mouth twice a week  . glucosamine-chondroitin 500-400 MG tablet Take 1 tablet by mouth daily.  [name] lisinopril (ZESTRIL) 40 MG tablet Take 1 tablet (40 mg total) by mouth daily.  . metoprolol succinate (TOPROL-XL) 50 MG 24 hr tablet TAKE 1 TABLET BY MOUTH DAILY WITH OR IMMEDIATELY FOLLOWING A MEAL  . Multiple Vitamin (MULTIVITAMIN) capsule Take 1 capsule by mouth daily.  . Omega-3 Fatty Acids (FISH OIL) 1000 MG CAPS Take by mouth.  . vitamin B-12 (CYANOCOBALAMIN) 100 MCG tablet Take 100 mcg by mouth daily.  [name] VITAMIN D, ERGOCALCIFEROL, PO Take 1,000 Units by mouth daily.  . clobetasol cream (TEMOVATE) [id number] % Apply 1 application topically 2 (two) times daily as needed (rashes).   No facility-administered encounter medications on file as of 08/13/2019.    Activities of Daily Living In your present state of  health, do you have any difficulty performing the following activities: 08/13/2019 04/18/2019  Hearing? Y Y  Comment declines hearing aids -  Vision? N N  Difficulty concentrating or making decisions? N N  Walking or climbing stairs? Y N  Dressing or bathing? N N  Doing errands, shopping? N N  Preparing Food and eating ? N -  Using the Toilet? N -  In the past six months, have you accidently leaked urine? Y -  Comment wears pads/depends for protection -  Do you have problems with loss of bowel control? N -  Managing your Medications? N -  Managing your Finances? N -  Housekeeping or managing your Housekeeping? N -  Some recent data might be hidden    Patient Care Team: Delsa Grana, PA-C as PCP - General (Family Medicine) Murrell Redden, MD (Urology) Belva Crome, MD as Consulting Physician (Cardiology)    Assessment:   This is a routine wellness examination for [place].  Exercise Activities and Dietary recommendations Current Exercise Habits: Home exercise routine, Type of exercise: walking, Time (Minutes): 20, Frequency (Times/Week): 7, Weekly Exercise (Minutes/Week): 140, Intensity: Mild, Exercise limited by: orthopedic condition(s)  Goals    . DIET - INCREASE WATER INTAKE     Recommend drinking 6-8 glasses of water per day       Fall Risk Fall Risk  08/13/2019 04/18/2019 02/27/2019 10/16/2018 05/17/2018  Falls in the past year? 0 0 0 0 No  Number falls in past yr: 0 0 0 0 -  Injury with Fall? 0 0 0 0 -  Risk for fall due to : Orthopedic patient - - - -  Follow up Falls prevention discussed - - - -   [place]:  Any stairs in or around the home? Yes  If so, do they handrails? Yes   Home free of loose throw rugs in walkways, pet beds, electrical cords, etc? Yes  Adequate lighting in your home to reduce risk of falls? Yes   ASSISTIVE DEVICES UTILIZED TO PREVENT FALLS:  Life alert? No  Use of a cane, walker or w/c? No  Grab bars in  the bathroom? Yes  Shower chair or bench in shower? No  Elevated toilet seat or a handicapped toilet? No   DME ORDERS:  DME order needed?  No   TIMED UP AND GO:  Was the test performed? No . Telephonic visit.   Education: Fall risk prevention has been discussed.  Intervention(s) required? No   Depression Screen PHQ 2/9 Scores 08/13/2019 04/18/2019 04/18/2019 02/27/2019  PHQ - 2 Score 0 0 0 0  PHQ- 9 Score - 0 0 0     Cognitive Function     6CIT Screen 08/13/2019  What Year? 0 points  What month? 0 points  What time? 0 points  Count back from 20 0 points  Months in reverse 0 points  Repeat phrase 0 points  Total Score 0    Immunization History  Administered Date(s) Administered  . Fluad Quad(high Dose 65+) 04/18/2019  . Influenza, High Dose Seasonal PF 08/04/2016, 06/01/2017, 05/17/2018  . Influenza-Unspecified 04/09/2015, 05/09/2015  . Tdap 01/18/2018  . Zoster Recombinat (Shingrix) 01/25/2018    Qualifies for Shingles Vaccine? Yes  Due for second dose of shingrix  Tdap: Up to date  Flu Vaccine: Up to date  Pneumococcal Vaccine: Up to date   Screening Tests Health Maintenance  Topic Date Due  . TETANUS/TDAP  01/19/2028  . INFLUENZA VACCINE  Completed  . DEXA SCAN  Addressed  . PNA vac Low Risk Adult  Addressed    Cancer Screenings:  Colorectal Screening: No longer required.   Mammogram: Completed 10/05/17.  No longer required.    Bone Density: Completed 09/18/10. Results reflect  OSTEOPENIA. No longer required.   Lung Cancer Screening: (Low Dose CT Chest recommended if Age 64-80 years, 30 pack-year currently smoking OR have quit w/in 15years.) does not qualify.    Additional Screening:  Hepatitis C Screening: no longer required  Vision Screening: Recommended annual ophthalmology exams for early detection of glaucoma and other disorders of the eye. Is the patient up to date with their annual eye exam?  Yes  Who is the provider or what is the name of  the office in which the pt attends annual eye exams? [facility]  Dental Screening: Recommended annual dental exams for proper oral hygiene  Community Resource Referral:  CRR required this visit?  No       Plan:    I have personally reviewed and addressed the Medicare Annual Wellness questionnaire and have noted the following in the patient's chart:  A. Medical and social history B. Use of alcohol, tobacco or illicit drugs  C. Current medications and supplements D. Functional ability and status E.  Nutritional status F.  Physical activity G. Advance directives H. List of other physicians I.  Hospitalizations, surgeries, and ER visits in previous 12 months J.  [place] such as hearing and vision if needed, cognitive and depression L. Referrals and appointments   In addition, I have reviewed and discussed with patient certain preventive protocols, quality metrics, and best practice recommendations. A written personalized care plan for preventive services as well as general preventive health recommendations were provided to patient.   Signed,  Clemetine Marker, LPN Nurse Health Advisor    Nurse Notes: pt inquired about Covid-19 vaccine and informed her of phase 1b testing location through [facility]. Pt also states she has improved her diet due to A1c in prediabetic range at last visit. Pt doing well and appreciative of visit.

## 2019-08-13 NOTE — Patient Instructions (Addendum)
Angel [name] , Thank you for taking time to come for your Medicare Wellness Visit. I appreciate your ongoing commitment to your health goals. Please review the following plan we discussed and let me know if I can assist you in the future.   Screening recommendations/referrals: Colonoscopy: no longer required Mammogram: no longer required Bone Density: no longer required Recommended yearly ophthalmology/optometry visit for glaucoma screening and checkup Recommended yearly dental visit for hygiene and checkup  Vaccinations: Influenza vaccine: done 04/18/19  Pneumococcal vaccine: done 06/20/14 Tdap vaccine: done 01/18/18 Shingles vaccine: 1st dose of Shingrix completed 04/10/19    Advanced directives: Please bring a copy of your health care power of attorney and living will to the office at your convenience.  Conditions/risks identified: Recommend drinking 6-8 glasses of water per day.  Next appointment: Please follow up in one year for your Medicare Annual Wellness visit.     Preventive Care 68 Years and Older, Female Preventive care refers to lifestyle choices and visits with your health care provider that can promote health and wellness. What does preventive care include?  A yearly physical exam. This is also called an annual well check.  Dental exams once or twice a year.  Routine eye exams. Ask your health care provider how often you should have your eyes checked.  Personal lifestyle choices, including:  Daily care of your teeth and gums.  Regular physical activity.  Eating a healthy diet.  Avoiding tobacco and drug use.  Limiting alcohol use.  Practicing safe sex.  Taking low-dose aspirin every day.  Taking vitamin and mineral supplements as recommended by your health care provider. What happens during an annual well check? The services and screenings done by your health care provider during your annual well check will depend on your age, overall health, lifestyle risk  factors, and family history of disease. Counseling  Your health care provider may ask you questions about your:  Alcohol use.  Tobacco use.  Drug use.  Emotional well-being.  Home and relationship well-being.  Sexual activity.  Eating habits.  History of falls.  Memory and ability to understand (cognition).  Work and work Statistician.  Reproductive health. Screening  You may have the following tests or measurements:  Height, weight, and BMI.  Blood pressure.  Lipid and cholesterol levels. These may be checked every 5 years, or more frequently if you are over 60 years old.  Skin check.  Lung cancer screening. You may have this screening every year starting at age 41 if you have a 30-pack-year history of smoking and currently smoke or have quit within the past 15 years.  Fecal occult blood test (FOBT) of the stool. You may have this test every year starting at age 74.  Flexible sigmoidoscopy or colonoscopy. You may have a sigmoidoscopy every 5 years or a colonoscopy every 10 years starting at age 61.  Hepatitis C blood test.  Hepatitis B blood test.  Sexually transmitted disease (STD) testing.  Diabetes screening. This is done by checking your blood sugar (glucose) after you have not eaten for a while (fasting). You may have this done every 1-3 years.  Bone density scan. This is done to screen for osteoporosis. You may have this done starting at age 58.  Mammogram. This may be done every 1-2 years. Talk to your health care provider about how often you should have regular mammograms. Talk with your health care provider about your test results, treatment options, and if necessary, the need for more tests. Vaccines  Your health care provider may recommend certain vaccines, such as:  Influenza vaccine. This is recommended every year.  Tetanus, diphtheria, and acellular pertussis (Tdap, Td) vaccine. You may need a Td booster every 10 years.  Zoster vaccine. You  may need this after age 26.  Pneumococcal 13-valent conjugate (PCV13) vaccine. One dose is recommended after age 24.  Pneumococcal polysaccharide (PPSV23) vaccine. One dose is recommended after age 3. Talk to your health care provider about which screenings and vaccines you need and how often you need them. This information is not intended to replace advice given to you by your health care provider. Make sure you discuss any questions you have with your health care provider. Document Released: 08/21/2015 Document Revised: 04/13/2016 Document Reviewed: 05/26/2015 Elsevier Interactive Patient Education  2017 [place] Prevention in the Home Falls can cause injuries. They can happen to people of all ages. There are many things you can do to make your home safe and to help prevent falls. What can I do on the outside of my home?  Regularly fix the edges of walkways and driveways and fix any cracks.  Remove anything that might make you trip as you walk through a door, such as a raised step or threshold.  Trim any bushes or trees on the path to your home.  Use bright outdoor lighting.  Clear any walking paths of anything that might make someone trip, such as rocks or tools.  Regularly check to see if handrails are loose or broken. Make sure that both sides of any steps have handrails.  Any raised decks and porches should have guardrails on the edges.  Have any leaves, snow, or ice cleared regularly.  Use sand or salt on walking paths during winter.  Clean up any spills in your garage right away. This includes oil or grease spills. What can I do in the bathroom?  Use night lights.  Install grab bars by the toilet and in the tub and shower. Do not use towel bars as grab bars.  Use non-skid mats or decals in the tub or shower.  If you need to sit down in the shower, use a plastic, non-slip stool.  Keep the floor dry. Clean up any water that spills on the floor as soon as  it happens.  Remove soap buildup in the tub or shower regularly.  Attach bath mats securely with double-sided non-slip rug tape.  Do not have throw rugs and other things on the floor that can make you trip. What can I do in the bedroom?  Use night lights.  Make sure that you have a light by your bed that is easy to reach.  Do not use any sheets or blankets that are too big for your bed. They should not hang down onto the floor.  Have a firm chair that has side arms. You can use this for support while you get dressed.  Do not have throw rugs and other things on the floor that can make you trip. What can I do in the kitchen?  Clean up any spills right away.  Avoid walking on wet floors.  Keep items that you use a lot in easy-to-reach places.  If you need to reach something above you, use a strong step stool that has a grab bar.  Keep electrical cords out of the way.  Do not use floor polish or wax that makes floors slippery. If you must use wax, use non-skid floor wax.  Do  not have throw rugs and other things on the floor that can make you trip. What can I do with my stairs?  Do not leave any items on the stairs.  Make sure that there are handrails on both sides of the stairs and use them. Fix handrails that are broken or loose. Make sure that handrails are as long as the stairways.  Check any carpeting to make sure that it is firmly attached to the stairs. Fix any carpet that is loose or worn.  Avoid having throw rugs at the top or bottom of the stairs. If you do have throw rugs, attach them to the floor with carpet tape.  Make sure that you have a light switch at the top of the stairs and the bottom of the stairs. If you do not have them, ask someone to add them for you. What else can I do to help prevent falls?  Wear shoes that:  Do not have high heels.  Have rubber bottoms.  Are comfortable and fit you well.  Are closed at the toe. Do not wear sandals.  If  you use a stepladder:  Make sure that it is fully opened. Do not climb a closed stepladder.  Make sure that both sides of the stepladder are locked into place.  Ask someone to hold it for you, if possible.  Clearly mark and make sure that you can see:  Any grab bars or handrails.  First and last steps.  Where the edge of each step is.  Use tools that help you move around (mobility aids) if they are needed. These include:  Canes.  Walkers.  Scooters.  Crutches.  Turn on the lights when you go into a dark area. Replace any light bulbs as soon as they burn out.  Set up your furniture so you have a clear path. Avoid moving your furniture around.  If any of your floors are uneven, fix them.  If there are any pets around you, be aware of where they are.  Review your medicines with your doctor. Some medicines can make you feel dizzy. This can increase your chance of falling. Ask your doctor what other things that you can do to help prevent falls. This information is not intended to replace advice given to you by your health care provider. Make sure you discuss any questions you have with your health care provider. Document Released: 05/21/2009 Document Revised: 12/31/2015 Document Reviewed: 08/29/2014 Elsevier Interactive Patient Education  2017 [employer].

## 2019-08-16 ENCOUNTER — Encounter: Payer: Self-pay | Admitting: Family Medicine

## 2019-08-16 ENCOUNTER — Other Ambulatory Visit: Payer: Self-pay

## 2019-08-16 ENCOUNTER — Ambulatory Visit (INDEPENDENT_AMBULATORY_CARE_PROVIDER_SITE_OTHER): Payer: Medicare Other | Admitting: Family Medicine

## 2019-08-16 VITALS — Ht 64.0 in | Wt 181.0 lb

## 2019-08-16 DIAGNOSIS — E785 Hyperlipidemia, unspecified: Secondary | ICD-10-CM

## 2019-08-16 DIAGNOSIS — Z79890 Hormone replacement therapy (postmenopausal): Secondary | ICD-10-CM

## 2019-08-16 DIAGNOSIS — R7303 Prediabetes: Secondary | ICD-10-CM | POA: Diagnosis not present

## 2019-08-16 DIAGNOSIS — I10 Essential (primary) hypertension: Secondary | ICD-10-CM

## 2019-08-16 DIAGNOSIS — M170 Bilateral primary osteoarthritis of knee: Secondary | ICD-10-CM

## 2019-08-16 DIAGNOSIS — N183 Chronic kidney disease, stage 3 unspecified: Secondary | ICD-10-CM | POA: Diagnosis not present

## 2019-08-16 DIAGNOSIS — M79671 Pain in right foot: Secondary | ICD-10-CM

## 2019-08-16 DIAGNOSIS — N3941 Urge incontinence: Secondary | ICD-10-CM

## 2019-08-16 DIAGNOSIS — M79672 Pain in left foot: Secondary | ICD-10-CM

## 2019-08-16 MED ORDER — ESTROGENS CONJUGATED 0.3 MG PO TABS: ORAL_TABLET | ORAL | 1 refills | Status: DC

## 2019-08-16 NOTE — Progress Notes (Signed)
Name: Angel [name]   MRN: [id number]    DOB: 08/12/1930   Date:08/16/2019       Progress Note  Subjective:    I connected with  Angel [name]  on 08/16/19 at 10:40 AM EST by a video enabled telemedicine application and verified that I am speaking with the correct person using two identifiers.  I discussed the limitations of evaluation and management by telemedicine and the availability of in person appointments. The patient expressed understanding and agreed to proceed. Staff also discussed with the patient that there may be a patient responsible charge related to this service. Patient Location: home Provider Location: [facility] Additional Individuals present: none  Chief Complaint  Patient presents with  . Follow-up    Pre diabetic, working very hard to get A1C down, Handicap card, discuss Covid Vaccine  . Medication Refill    Premarin refill    Angel [name] is a 86 y.o. female, presents for virtual visit for routine follow up on the conditions listed above.  Routine f/up   She wants refill on premarin -   Prediabetes- last A1C was 6.0, had hx of elevated blood sugar but no A1C in chart prior to that, she is working on a diabetic diet being very diligent also has been a lot of physical therapy is trying to be more physically active and trying to lose a few pounds. She would like to recheck her labs and does want them to be ordered today and she will come in the office next week to complete.  HTN-unable to check her blood pressure at home it has been well controlled, patient states it is stable, continues to take lisinopril 40 mg, amlodipine 5 mg and metoprolol 50 mg daily does not have any side effects or concerns, no chest pain, shortness of breath, lightheaded episodes, lower extremity edema, headaches. BP Readings from Last 3 Encounters:  04/18/19 126/80  02/13/19 132/72  06/07/18 117/78   HLD- Current Medication Regimen: Lipitor 40 mg tablets every night good  compliance Last Lipids: Lab Results  Component Value Date   CHOL 179 02/13/2019   HDL 42 02/13/2019   LDLCALC 74 02/13/2019   TRIG 314 (H) 02/13/2019   CHOLHDL 4.3 02/13/2019  - Current Diet:  Diabetic diet now - Reports: Chest pain, shortness of breath, myalgias.   Severe arthritis - doing physical therapy for the past several months and she did improve her strength range of motion and decreased pain she is now doing home exercise program. She does request a disability placard application be filled out today to help her. This has been completed and will be available in the front office.  Patient is going to schedule an appointment at [facility] to receive Covid vaccine we discussed briefly today, all patient's questions were asked and answered.  Patient Active Problem List   Diagnosis Date Noted  . Arthritis of knee, degenerative 05/22/2018  . Cataract, right eye 05/17/2018  . Aortic stenosis, mild 02/22/2018  . Obesity (BMI 30.0-34.9) 01/18/2018  . Hip pain, chronic 04/02/2015  . Hearing loss of both ears 04/02/2015  . Osteopenia of the elderly 04/02/2015  . Hormone replacement therapy (postmenopausal) 04/02/2015  . Aortic aneurysm without rupture ([place]) 04/02/2015  . Situational anxiety 04/02/2015  . Hyperlipidemia LDL goal <70 11/18/2014  . Mixed stress and urge urinary incontinence 06/29/2014  . Hypertension goal BP (blood pressure) < 140/90 11/06/2013  . CAD (coronary artery disease) 11/06/2013    Current Outpatient  Medications:  .  amLODipine (NORVASC) 5 MG tablet, TAKE 1 TABLET BY MOUTH DAILY, Disp: 90 tablet, Rfl: 3 .  Ascorbic Acid (VITAMIN C) 100 MG tablet, Take 100 mg by mouth daily., Disp: , Rfl:  .  atorvastatin (LIPITOR) 40 MG tablet, TAKE ONE TABLET BY MOUTH EVERY NIGHT AT BEDTIME, Disp: 90 tablet, Rfl: 3 .  clobetasol cream (TEMOVATE) [id number] %, Apply 1 application topically 2 (two) times daily as needed (rashes)., Disp: , Rfl:  .  estrogens,  conjugated, (PREMARIN) 0.3 MG tablet, Take one-half of a tablet by mouth twice a week, Disp: 12 tablet, Rfl: 1 .  glucosamine-chondroitin 500-400 MG tablet, Take 1 tablet by mouth daily., Disp: , Rfl:  .  lisinopril (ZESTRIL) 40 MG tablet, Take 1 tablet (40 mg total) by mouth daily., Disp: 90 tablet, Rfl: 3 .  metoprolol succinate (TOPROL-XL) 50 MG 24 hr tablet, TAKE 1 TABLET BY MOUTH DAILY WITH OR IMMEDIATELY FOLLOWING A MEAL, Disp: 90 tablet, Rfl: 3 .  Multiple Vitamin (MULTIVITAMIN) capsule, Take 1 capsule by mouth daily., Disp: , Rfl:  .  Omega-3 Fatty Acids (FISH OIL) 1000 MG CAPS, Take by mouth., Disp: , Rfl:  .  vitamin B-12 (CYANOCOBALAMIN) 100 MCG tablet, Take 100 mcg by mouth daily., Disp: , Rfl:  .  VITAMIN D, ERGOCALCIFEROL, PO, Take 1,000 Units by mouth daily., Disp: , Rfl:  Allergies  Allergen Reactions  . Myrbetriq [Mirabegron] Other (See Comments)    Severe depression  . Codeine Other (See Comments)    "zoned out"  . Levofloxacin Other (See Comments)    AMS  . Prednisone Other (See Comments)    AMS  . Sulfa Antibiotics Other (See Comments)    AMS    Past Surgical History:  Procedure Laterality Date  . ABDOMINAL HYSTERECTOMY  1976   total  . APPENDECTOMY    . BREAST EXCISIONAL BIOPSY Left 1987   benign  . CATARACT EXTRACTION, BILATERAL  08/2018   Dr. Manuella Ghazi with [facility]   Family History  Problem Relation Age of Onset  . CVA Mother   . Stroke Mother   . Breast cancer Maternal Grandmother 60  . Heart disease Maternal Grandmother   . Cancer Brother        skin   Social History   Socioeconomic History  . Marital status: Widowed    Spouse name: Not on file  . Number of children: 3  . Years of education: Not on file  . Highest education level: Not on file  Occupational History  . Not on file  Tobacco Use  . Smoking status: Never Smoker  . Smokeless tobacco: Never Used  Substance and Sexual Activity  . Alcohol use: Yes    Alcohol/week: 1.0  standard drinks    Types: 1 Cans of beer per week    Comment: rare  . Drug use: No  . Sexual activity: Not on file  Other Topics Concern  . Not on file  Social History Narrative  . Not on file   Social Determinants of Health   Financial Resource Strain: Low Risk   . Difficulty of Paying Living Expenses: Not hard at all  Food Insecurity: No Food Insecurity  . Worried About Charity fundraiser in the Last Year: Never true  . Ran Out of Food in the Last Year: Never true  Transportation Needs: No Transportation Needs  . Lack of Transportation (Medical): No  . Lack of Transportation (Non-Medical): No  Physical Activity: Insufficiently  Active  . Days of Exercise per Week: 7 days  . Minutes of Exercise per Session: 20 min  Stress: No Stress Concern Present  . Feeling of Stress : Only a little  Social Connections: Unknown  . Frequency of Communication with Friends and Family: Patient refused  . Frequency of Social Gatherings with Friends and Family: Patient refused  . Attends Religious Services: Patient refused  . Active Member of Clubs or Organizations: Patient refused  . Attends Archivist Meetings: Patient refused  . Marital Status: Widowed  Intimate Partner Violence: Not At Risk  . Fear of Current or Ex-Partner: No  . Emotionally Abused: No  . Physically Abused: No  . Sexually Abused: No    Chart Review Today: I personally reviewed active problem list, medication list, allergies, family history, social history, health maintenance, notes from last encounter, lab results, imaging with the patient/caregiver today.   Review of Systems  Constitutional: Negative.   HENT: Negative.   Eyes: Negative.   Respiratory: Negative.   Cardiovascular: Negative.   Gastrointestinal: Negative.   Endocrine: Negative.   Genitourinary: Negative.   Musculoskeletal: Negative.   Skin: Negative.   Allergic/Immunologic: Negative.   Neurological: Negative.   Hematological: Negative.    Psychiatric/Behavioral: Negative.   All other systems reviewed and are negative.     Objective:    Virtual encounter, vitals limited, only able to obtain the following Today's Vitals   08/16/19 0914  Weight: 181 lb (82.1 kg)  Height: 5\' 4"  (1.626 m)   Body mass index is 31.07 kg/m. Nursing Note and Vital Signs reviewed.  Physical Exam  PE limited by telephone encounter  No results found for this or any previous visit (from the past 72 hour(s)).  PHQ2/9: Depression screen [facility] 2/9 08/16/2019 08/13/2019 04/18/2019 04/18/2019 02/27/2019  Decreased Interest 0 0 0 0 0  Down, Depressed, Hopeless 0 0 0 0 0  PHQ - 2 Score 0 0 0 0 0  Altered sleeping 0 - 0 0 0  Tired, decreased energy 0 - 0 0 0  Change in appetite 0 - 0 0 0  Feeling bad or failure about yourself  0 - 0 0 0  Trouble concentrating 0 - 0 0 0  Moving slowly or fidgety/restless 0 - 0 0 0  Suicidal thoughts 0 - 0 0 0  PHQ-9 Score 0 - 0 0 0  Difficult doing work/chores Not difficult at all - Not difficult at all - Not difficult at all   PHQ-2/9 Result is negative - reviewed   Fall Risk: Fall Risk  08/16/2019 08/13/2019 04/18/2019 02/27/2019 10/16/2018  Falls in the past year? 0 0 0 0 0  Number falls in past yr: 0 0 0 0 0  Injury with Fall? 0 0 0 0 0  Risk for fall due to : - Orthopedic patient - - -  Follow up - Falls prevention discussed - - -     Assessment and Plan:     ICD-10-CM   1. Hypertension goal BP (blood pressure) < 140/90  I10 CMP w GFR   will need VS when she comes for labs, HTN has been well controlled and stable  2. Hyperlipidemia, unspecified hyperlipidemia type  E78.5 CMP w GFR    Lipid Panel   compliant with meds, well controlled  3. Stage 3 chronic kidney disease, unspecified whether stage 3a or 3b CKD  N18.30 CMP w GFR   recheck renal function  4. Prediabetes  R73.03 CMP w GFR  A1C   recheck A1C after diligent diet changes over the past 4 months  5. Hormone replacement therapy (postmenopausal)   Z79.890 estrogens, conjugated, (PREMARIN) 0.3 MG tablet   wants to continue low dosing - OK with cardiology, med refill today  6. Primary osteoarthritis of both knees  M17.0    did PT for knees and back arthritic conditions  7. Bilateral foot pain  M79.671    M79.672    going to podiatry  8. Urge incontinence of urine  N39.41    per urology   Handicap application completed today Patient will return next week to come into clinic for labs and to get her vital signs done We will have her return for follow-up in roughly 4 to 6 months   I discussed the assessment and treatment plan with the patient. The patient was provided an opportunity to ask questions and all were answered. The patient agreed with the plan and demonstrated an understanding of the instructions.  The patient was advised to call back or seek an in-person evaluation if the symptoms worsen or if the condition fails to improve as anticipated.  I provided 15 minutes of non-face-to-face time during this encounter.  Delsa Grana, PA-C 08/16/19 11:12 AM

## 2019-08-20 ENCOUNTER — Encounter: Payer: Self-pay | Admitting: Podiatry

## 2019-08-20 ENCOUNTER — Ambulatory Visit (INDEPENDENT_AMBULATORY_CARE_PROVIDER_SITE_OTHER): Payer: Medicare Other

## 2019-08-20 ENCOUNTER — Other Ambulatory Visit: Payer: Self-pay | Admitting: Podiatry

## 2019-08-20 ENCOUNTER — Other Ambulatory Visit: Payer: Self-pay

## 2019-08-20 ENCOUNTER — Ambulatory Visit (INDEPENDENT_AMBULATORY_CARE_PROVIDER_SITE_OTHER): Payer: Medicare Other | Admitting: Podiatry

## 2019-08-20 DIAGNOSIS — M2042 Other hammer toe(s) (acquired), left foot: Secondary | ICD-10-CM | POA: Diagnosis not present

## 2019-08-20 DIAGNOSIS — M76821 Posterior tibial tendinitis, right leg: Secondary | ICD-10-CM

## 2019-08-20 DIAGNOSIS — M779 Enthesopathy, unspecified: Secondary | ICD-10-CM

## 2019-08-20 DIAGNOSIS — M21612 Bunion of left foot: Secondary | ICD-10-CM | POA: Diagnosis not present

## 2019-08-20 MED ORDER — MELOXICAM 7.5 MG PO TABS: 7.5000 mg | ORAL_TABLET | Freq: Every day | ORAL | 1 refills | Status: DC

## 2019-08-23 ENCOUNTER — Ambulatory Visit: Payer: Medicare Other | Attending: Internal Medicine

## 2019-08-23 DIAGNOSIS — Z23 Encounter for immunization: Secondary | ICD-10-CM

## 2019-08-23 NOTE — Progress Notes (Signed)
   Subjective: 86 y.o. female presenting today with a chief complaint of intermittent aching pain of the bilateral feet that began about two years ago. She states the pain is worse in the morning. She states she walks 20-30 minutes daily which increases the pain. She has been using Arthrozene for treatment. Patient is here for further evaluation and treatment.   Past Medical History:  Diagnosis Date  . Anxiety   . Aortic aneurysm without rupture ([place])   . CAD (coronary artery disease)   . Chronic hip pain   . Endometriosis   . Hearing loss   . HTN, goal below 150/90   . Hyperlipidemia LDL goal <70 11/18/2014  . Osteopenia of the elderly   . Urge and stress incontinence      Objective: Physical Exam General: The patient is alert and oriented x3 in no acute distress.  Dermatology: Skin is cool, dry and supple bilateral lower extremities. Negative for open lesions or macerations.  Vascular: Palpable pedal pulses bilaterally. No edema or erythema noted. Capillary refill within normal limits.  Neurological: Epicritic and protective threshold grossly intact bilaterally.   Musculoskeletal Exam: Clinical evidence of bunion deformity noted to the respective foot. There is moderate pain on palpation range of motion of the first MPJ. Lateral deviation of the hallux noted consistent with hallux abductovalgus. Hammertoe contracture also noted on clinical exam to the 2nd digit of the left foot. Symptomatic pain on palpation and range of motion also noted to the metatarsal phalangeal joints of the respective hammertoe digits.   Pain on palpation noted to the posterior tibial tendon of the right foot with medial longitudinal arch collapse noted.  Radiographic Exam: Increased intermetatarsal angle greater than 15 with a hallux abductus angle greater than 30 noted on AP view. Moderate degenerative changes noted within the first MPJ. Contracture deformity also noted to the interphalangeal joints and  MPJs of the digits of the respective hammertoes.    Assessment: 1. HAV w/ bunion deformity left 2. Hammertoe deformity 2nd digit left 3. PTTD right     Plan of Care:  1. Patient was evaluated. X-Rays reviewed. 2. Injection of 0.5 mLs Celestone Soluspan injected into the posterior tibial tendon sheath of the right foot.  3. Plantar fascial brace dispensed.  4. Prescription for Meloxicam 7.5 mg provided to patient. 5. Continue wearing Orthofeet shoes.  6. Return to clinic in 4 weeks.   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     Edrick Kins, DPM [facility]  Dr. Edrick Kins, [address]                                        [place], [place] [address]                Office [phone]  Fax [phone]

## 2019-08-23 NOTE — Progress Notes (Signed)
   Covid-19 Vaccination Clinic  Name:  SHAMAINE CINK    MRN: [id number] DOB: Aug 26, 1930  08/23/2019  Ms. Lundberg was observed post Covid-19 immunization for 15 minutes without incidence. She was provided with Vaccine Information Sheet and instruction to access the V-Safe system.   Ms. Meineke was instructed to call 911 with any severe reactions post vaccine: [name] Difficulty breathing  . Swelling of your face and throat  . A fast heartbeat  . A bad rash all over your body  . Dizziness and weakness    Immunizations Administered    Name Date Dose VIS Date Route   Pfizer COVID-19 Vaccine 08/23/2019 11:19 AM 0.3 mL 07/19/2019 Intramuscular   Manufacturer: [employer]   Lot: [id number]   [place]: [id number]

## 2019-09-13 ENCOUNTER — Ambulatory Visit: Payer: Medicare Other | Attending: Internal Medicine

## 2019-09-13 DIAGNOSIS — Z23 Encounter for immunization: Secondary | ICD-10-CM | POA: Insufficient documentation

## 2019-09-13 NOTE — Progress Notes (Signed)
   Covid-19 Vaccination Clinic  Name:  Angel [name]    MRN: [id number] DOB: Oct 27, 1930  09/13/2019  [name] was observed post Covid-19 immunization for 15 minutes without incidence. She was provided with Vaccine Information Sheet and instruction to access the V-Safe system.   [name] was instructed to call 911 with any severe reactions post vaccine: [name] Difficulty breathing  . Swelling of your face and throat  . A fast heartbeat  . A bad rash all over your body  . Dizziness and weakness    Immunizations Administered    Name Date Dose VIS Date Route   Pfizer COVID-19 Vaccine 09/13/2019 11:23 AM 0.3 mL 07/19/2019 Intramuscular   Manufacturer: [place]   Lot: [id number]   [place]: [id number]

## 2019-09-20 ENCOUNTER — Ambulatory Visit: Payer: Medicare Other | Admitting: Podiatry

## 2019-09-27 ENCOUNTER — Ambulatory Visit: Payer: Medicare Other | Admitting: Podiatry

## 2019-10-13 ENCOUNTER — Other Ambulatory Visit: Payer: Self-pay | Admitting: Podiatry

## 2019-10-18 ENCOUNTER — Ambulatory Visit: Payer: Medicare Other | Admitting: Podiatry

## 2019-10-25 ENCOUNTER — Ambulatory Visit (INDEPENDENT_AMBULATORY_CARE_PROVIDER_SITE_OTHER): Payer: Medicare Other | Admitting: Podiatry

## 2019-10-25 ENCOUNTER — Other Ambulatory Visit: Payer: Self-pay

## 2019-10-25 DIAGNOSIS — M76821 Posterior tibial tendinitis, right leg: Secondary | ICD-10-CM

## 2019-10-25 DIAGNOSIS — M722 Plantar fascial fibromatosis: Secondary | ICD-10-CM | POA: Diagnosis not present

## 2019-10-25 DIAGNOSIS — M2042 Other hammer toe(s) (acquired), left foot: Secondary | ICD-10-CM

## 2019-10-25 DIAGNOSIS — M21612 Bunion of left foot: Secondary | ICD-10-CM

## 2019-10-29 NOTE — Progress Notes (Signed)
   Subjective: 86 y.o. female presenting today for follow up evaluation of bilateral foot pain. She states the right foot is showing some improvement. She states the left foot feels even better than the right. She reports some intermittent left heel pain. She has been taking Meloxicam as directed. There are no worsening factors noted. Patient is here for further evaluation and treatment.   Past Medical History:  Diagnosis Date  . Anxiety   . Aortic aneurysm without rupture ([place])   . CAD (coronary artery disease)   . Chronic hip pain   . Endometriosis   . Hearing loss   . HTN, goal below 150/90   . Hyperlipidemia LDL goal <70 11/18/2014  . Osteopenia of the elderly   . Urge and stress incontinence      Objective: Physical Exam General: The patient is alert and oriented x3 in no acute distress.  Dermatology: Skin is cool, dry and supple bilateral lower extremities. Negative for open lesions or macerations.  Vascular: Palpable pedal pulses bilaterally. No edema or erythema noted. Capillary refill within normal limits.  Neurological: Epicritic and protective threshold grossly intact bilaterally.   Musculoskeletal Exam: Clinical evidence of bunion deformity noted to the respective foot. There is moderate pain on palpation range of motion of the first MPJ. Lateral deviation of the hallux noted consistent with hallux abductovalgus. Hammertoe contracture also noted on clinical exam to the 2nd digit of the left foot. Symptomatic pain on palpation and range of motion also noted to the metatarsal phalangeal joints of the respective hammertoe digits.   Pain on palpation noted to the posterior tibial tendon of the right foot with medial longitudinal arch collapse noted. Pain with palpation noted to the left heel along the plantar fascia.    Assessment: 1. HAV w/ bunion deformity left 2. Hammertoe deformity 2nd digit left 3. PTTD right   4. Plantar fasciitis left    Plan of Care:  1.  Patient was evaluated.  2. Injection of 0.5 mLs Celestone Soluspan injected into the posterior tibial tendon sheath of the right foot.  3. Injection of 0.5 mLs Celestone Soluspan injected into the left heel.  4. Continue taking Meloxicam 7.5 mg.  5. Continue wearing Orthofeet shoes.  6. Darco toe splint dispensed for left 2nd toe.  7. Return to clinic in 3 months.   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     Edrick Kins, DPM [facility]  Dr. Edrick Kins, [address]                                        [place], [place] [address]                Office [phone]  Fax [phone]

## 2019-11-15 ENCOUNTER — Telehealth: Payer: Self-pay | Admitting: Interventional Cardiology

## 2019-11-15 NOTE — Telephone Encounter (Signed)
Pt states that for the last 2-3 weeks she has been having some vague and intermittent chest pressure.  Heart feels like someone is squeezing it.  Lasts 15-20 mins and resolves.  No sharp or shooting pains.  Denies any other symptoms when this is occurring. Doesn't occur everyday.  Pt doesn't check her vitals.  Scheduled pt to come in next Wednesday, 4/14 to see Richardson Dopp, PA-C for evaluation.  Advised pt if symptoms worsen, go to ER to get checked out.  Pt verbalized understanding and was in agreement with this plan.

## 2019-11-15 NOTE — Telephone Encounter (Signed)
Pt c/o of Chest Pain: STAT if CP now or developed within 24 hours  1. Are you having CP right now? No   2. Are you experiencing any other symptoms (ex. SOB, nausea, vomiting, sweating)? No   3. How long have you been experiencing CP? 2-3 weeks   4. Is your CP continuous or coming and going? Comes and goes, feels like her heart is being squeezed, last for about 15-20 mins  5. Have you taken Nitroglycerin? No  ?

## 2019-11-20 ENCOUNTER — Encounter: Payer: Self-pay | Admitting: Physician Assistant

## 2019-11-20 ENCOUNTER — Other Ambulatory Visit: Payer: Self-pay

## 2019-11-20 ENCOUNTER — Ambulatory Visit (INDEPENDENT_AMBULATORY_CARE_PROVIDER_SITE_OTHER): Payer: Medicare Other | Admitting: Physician Assistant

## 2019-11-20 VITALS — BP 144/60 | HR 87 | Ht 64.0 in | Wt 190.4 lb

## 2019-11-20 DIAGNOSIS — I350 Nonrheumatic aortic (valve) stenosis: Secondary | ICD-10-CM | POA: Diagnosis not present

## 2019-11-20 DIAGNOSIS — I2510 Atherosclerotic heart disease of native coronary artery without angina pectoris: Secondary | ICD-10-CM | POA: Diagnosis not present

## 2019-11-20 DIAGNOSIS — R0789 Other chest pain: Secondary | ICD-10-CM | POA: Diagnosis not present

## 2019-11-20 DIAGNOSIS — I10 Essential (primary) hypertension: Secondary | ICD-10-CM | POA: Diagnosis not present

## 2019-11-20 MED ORDER — NITROGLYCERIN 0.4 MG SL SUBL: 0.4000 mg | SUBLINGUAL_TABLET | SUBLINGUAL | 3 refills | Status: DC | PRN

## 2019-11-20 MED ORDER — METOPROLOL SUCCINATE ER 50 MG PO TB24: 75.0000 mg | ORAL_TABLET | Freq: Every day | ORAL | 1 refills | Status: DC

## 2019-11-20 NOTE — Patient Instructions (Signed)
Medication Instructions:   Your physician has recommended you make the following change in your medication:   1) Start Nitroglycerin 0.4 mg, 1 tablet under the tongue as needed for chest pain 2) Increase Metoprolol to 75 mg, 1.5 tablets by mouth once a day  *If you need a refill on your cardiac medications before your next appointment, please call your pharmacy*  Lab Work:  None ordered today  Testing/Procedures:  None ordered today  Follow-Up: At [facility], you and your health needs are our priority.  As part of our continuing mission to provide you with exceptional heart care, we have created designated Provider Care Teams.  These Care Teams include your primary Cardiologist (physician) and Advanced Practice Providers (APPs -  Physician Assistants and Nurse Practitioners) who all work together to provide you with the care you need, when you need it.  We recommend signing up for the patient portal called "MyChart".  Sign up information is provided on this After Visit Summary.  MyChart is used to connect with patients for Virtual Visits (Telemedicine).  Patients are able to view lab/test results, encounter notes, upcoming appointments, etc.  Non-urgent messages can be sent to your provider as well.   To learn more about what you can do with MyChart, go to [email or web address].    Your next appointment:    On 01/27/20 at 3:20PM with Daneen Schick, MD

## 2019-11-20 NOTE — Progress Notes (Signed)
Cardiology Office Note:    Date:  11/20/2019   ID:  Taleyah, Hillman [birth year], MRN [id number]  PCP:  Delsa Grana, PA-C  Cardiologist:  Sinclair Grooms, MD  Electrophysiologist:  None   Referring MD: Delsa Grana, PA-C   Chief Complaint:  Chest Pain    Patient Profile:    Angel [name] is a 86 y.o. female with:   Presumed CAD  Hx abnormal nuclear study (according to OV notes)  ?  Small apical infarct versus diverticulum by cardiac MRI (according to OV notes)  Hypertension  Hyperlipidemia  Aortic stenosis  Echo 01/2019: EF 60-65, mild aortic stenosis (mean 13 mmHg)  Mild dilation of ascending aorta  Prior CV studies: Echocardiogram 02/04/2019 EF 60-65, mild LVH, GR 1 DD, mild MAC, mild dilation of ascending aorta (38 mm), mild aortic stenosis (mean 13 mmHg)  Echocardiogram 02/02/2017 EF 60-65, no RWMA, GR 1 DD, mild-moderate aortic stenosis (mean 16 mmHg, peak 23 mmHg), trivial MR, mild LAE, normal RV SF, mild TR    History of Present Illness:    Ms. Staiger was last seen by Dr. Tamala Julian in July 2020.  She called in recently with complaints of chest discomfort and was scheduled for further evaluation.  She had an episode of left chest squeezing that lasted several minutes last week.  This occurred at rest.  She does not have nitroglycerin.  The symptoms waxed and waned before they finally resolved.  She felt somewhat tired after this.  She has not had a recurrence.  She has fairly chronic substernal chest pain that she has had for years without change.  She also is short of breath with certain activities.  This is overall unchanged.  She sleeps on 2 pillows.  She has not had paroxysmal nocturnal dyspnea, significant leg swelling, syncope.    Past Medical History:  Diagnosis Date  . Anxiety   . Aortic aneurysm without rupture ([place])   . CAD (coronary artery disease)   . Chronic hip pain   . Endometriosis   . Hearing loss   . HTN, goal below 150/90   .  Hyperlipidemia LDL goal <70 11/18/2014  . Osteopenia of the elderly   . Urge and stress incontinence     Current Medications: Current Meds  Medication Sig  . amLODipine (NORVASC) 5 MG tablet TAKE 1 TABLET BY MOUTH DAILY  . Ascorbic Acid (VITAMIN C) 100 MG tablet Take 100 mg by mouth daily.  [name] atorvastatin (LIPITOR) 40 MG tablet TAKE ONE TABLET BY MOUTH EVERY NIGHT AT BEDTIME  . clobetasol cream (TEMOVATE) 7.82 % Apply 1 application topically 2 (two) times daily as needed (rashes).  . estrogens, conjugated, (PREMARIN) 0.3 MG tablet Take one-half of a tablet by mouth twice a week  . glucosamine-chondroitin 500-400 MG tablet Take 1 tablet by mouth daily.  [name] lisinopril (ZESTRIL) 40 MG tablet Take 1 tablet (40 mg total) by mouth daily.  . meloxicam (MOBIC) 7.5 MG tablet TAKE 1 TABLET BY MOUTH EVERY DAY  . metoprolol succinate (TOPROL-XL) 50 MG 24 hr tablet Take 1.5 tablets (75 mg total) by mouth daily. Take with or immediately following a meal.  . Multiple Vitamin (MULTIVITAMIN) capsule Take 1 capsule by mouth daily.  . Omega-3 Fatty Acids (FISH OIL) 1000 MG CAPS Take by mouth.  . vitamin B-12 (CYANOCOBALAMIN) 100 MCG tablet Take 100 mcg by mouth daily.  [name] VITAMIN D, ERGOCALCIFEROL, PO Take 1,000 Units by mouth daily.  . [DISCONTINUED] metoprolol succinate (TOPROL-XL)  50 MG 24 hr tablet TAKE 1 TABLET BY MOUTH DAILY WITH OR IMMEDIATELY FOLLOWING A MEAL     Allergies:   Codeine, Levofloxacin, Myrbetriq [mirabegron], Prednisone, and Sulfa antibiotics   Social History   Tobacco Use  . Smoking status: Never Smoker  . Smokeless tobacco: Never Used  Substance Use Topics  . Alcohol use: Yes    Alcohol/week: 1.0 standard drinks    Types: 1 Cans of beer per week    Comment: rare  . Drug use: No     Family Hx: The patient's family history includes Breast cancer (age of onset: 32) in her maternal grandmother; CVA in her mother; Cancer in her brother; Heart disease in her maternal grandmother;  Stroke in her mother.  Review of Systems  Constitution: Negative for chills and fever.  Respiratory: Negative for cough.   Gastrointestinal: Negative for hematochezia and melena.  Genitourinary: Negative for hematuria.     EKGs/Labs/Other Test Reviewed:    EKG:  EKG is  ordered today.  The ekg ordered today demonstrates normal sinus rhythm, HR 87, normal axis, low voltage, no st-tw changes, QTc 469, no change from last tracing   Recent Labs: 02/13/2019: ALT 21 04/18/2019: BUN 28; Creat 0.73; Potassium 4.2; Sodium 139   Recent Lipid Panel Lab Results  Component Value Date/Time   CHOL 179 02/13/2019 04:26 PM   TRIG 314 (H) 02/13/2019 04:26 PM   HDL 42 02/13/2019 04:26 PM   CHOLHDL 4.3 02/13/2019 04:26 PM   CHOLHDL 3.3 01/18/2018 02:16 PM   LDLCALC 74 02/13/2019 04:26 PM   LDLCALC 82 01/18/2018 02:16 PM    Physical Exam:    VS:  BP (!) 144/60   Pulse 87   Ht _0  (1.626 m)   Wt 190 lb 6.4 oz (86.4 kg)   LMP  (LMP Unknown)   SpO2 94%   BMI 32.68 kg/m     Wt Readings from Last 3 Encounters:  11/20/19 190 lb 6.4 oz (86.4 kg)  08/16/19 181 lb (82.1 kg)  08/13/19 181 lb (82.1 kg)     Constitutional:      Appearance: Healthy appearance. Not in distress.  Neck:     Thyroid: No thyromegaly.     Vascular: JVD normal.  Pulmonary:     Breath sounds: No wheezing. No rales.  Chest:     Chest wall: Not tender to palpatation.  Cardiovascular:     Normal rate. Regular rhythm. Normal S1. Normal S2.     Murmurs: There is a grade 2/6 crescendo-decrescendo systolic murmur at the URSB.  Edema:    Peripheral edema absent.  Abdominal:     Palpations: Abdomen is soft.     Tenderness: There is no abdominal tenderness.  Skin:    General: Skin is warm and dry.  Neurological:     General: No focal deficit present.     Mental Status: Alert and oriented to person, place and time.     Cranial Nerves: Cranial nerves are intact.      ASSESSMENT & PLAN:    1. Coronary artery  disease involving native coronary artery of native heart without angina pectoris 2. Other chest pain She has a hx of presumed coronary artery disease.  The notes indicate she had a Myoview that was abnormal years ago.  MRI demonstrated small apical infarct vs diverticulum.  She has wanted to avoid cardiac catheterization and she has been tx medically.  She has done well for years.  Her symptoms of chest  pain last week are somewhat atypical for angina.  She is not tachycardic or hypoxic and did not have syncope.  So, I do not think she had a pulmonary embolism.  Her ECG is unchanged.  She has not had any fevers, cough, chills and her lung exam is normal.  Her symptoms may have been GI in nature.  Her BP is above goal.  We discussed advancing medical Rx for CAD as well as possible follow up testing.  She does not want to do a stress test or cardiac catheterization.  We discussed increasing her beta-blocker or adding a long acting nitrate. I have recommended that we increase her beta-blocker and give her some prn NTG.  If her symptom progress we could consider a Myoview or possible a Coronary CTA to better evaluate her symptoms.    -Increase Metoprolol succinate to 75 mg once daily   -Rx for prn NTG  -FU with Dr. Tamala Julian in 1-2 mos  -Return sooner if symptoms change or worsen  3. Aortic valve stenosis, etiology of cardiac valve disease unspecified Mild aortic stenosis by echocardiogram in 01/2019.  I do not think she needs an earlier follow up echocardiogram at this time.    4. Essential hypertension BP is above target.  Increase Metoprolol succinate as noted.     Dispo:  Return in about 2 months (around 01/20/2020) for Routine Follow Up with Dr. Tamala Julian, in person.   Medication Adjustments/Labs and Tests Ordered: Current medicines are reviewed at length with the patient today.  Concerns regarding medicines are outlined above.  Tests Ordered: Orders Placed This Encounter  Procedures  . EKG 12-Lead    Medication Changes: Meds ordered this encounter  Medications  . metoprolol succinate (TOPROL-XL) 50 MG 24 hr tablet    Sig: Take 1.5 tablets (75 mg total) by mouth daily. Take with or immediately following a meal.    Dispense:  135 tablet    Refill:  1  . nitroGLYCERIN (NITROSTAT) 0.4 MG SL tablet    Sig: Place 1 tablet (0.4 mg total) under the tongue every 5 (five) minutes as needed for chest pain.    Dispense:  25 tablet    Refill:  3    Signed, Richardson Dopp, PA-C  11/20/2019 5:36 PM    [facility] [place], [place], [place]  [address] Phone: [phone]; Fax: [phone]

## 2019-12-12 ENCOUNTER — Telehealth: Payer: Self-pay | Admitting: Family Medicine

## 2019-12-12 NOTE — Chronic Care Management (AMB) (Signed)
Chronic Care Management   Outreach Note  12/12/2019 Name: Angel [name] MRN: [id number] DOB: 30-May-1931  Angel [name] is a 86 y.o. year old female who is a primary care patient of Danelle Berry, [place]. I reached out to [name] by phone today in response to a referral sent by [name]'s health plan.     An unsuccessful telephone outreach was attempted today. The patient was referred to the case management team for assistance with care management and care coordination.   Follow Up Plan: A HIPPA compliant phone message was left for the patient providing contact information and requesting a return call.  The care management team will reach out to the patient again over the next 7 days.  If patient returns call to provider office, please advise to call Embedded Care Management Care Guide Penne Lash  at [phone]  Penne Lash, RMA Care Guide, Embedded Care Coordination [facility]  [place], [place] [address] Direct Dial: [phone] [email or web address]: [email or web address]

## 2019-12-15 ENCOUNTER — Other Ambulatory Visit: Payer: Self-pay | Admitting: Podiatry

## 2019-12-16 NOTE — Chronic Care Management (AMB) (Signed)
Chronic Care Management   Note  12/16/2019 Name: Angel [name] MRN: [id number] DOB: 1931/04/03  Angel [name] is a 86 y.o. year old female who is a primary care patient of Danelle Berry, [place]. I reached out to [name] by phone today in response to a referral sent by Ms. Zura Lancto Eno[place]'s health plan.     Angel [name] was given information about Chronic Care Management services today including:  1. CCM service includes personalized support from designated clinical staff supervised by her physician, including individualized plan of care and coordination with other care providers 2. 24/7 contact phone numbers for assistance for urgent and routine care needs. 3. Service will only be billed when office clinical staff spend 20 minutes or more in a month to coordinate care. 4. Only one practitioner may furnish and bill the service in a calendar month. 5. The patient may stop CCM services at any time (effective at the end of the month) by phone call to the office staff. 6. The patient will be responsible for cost sharing (co-pay) of up to 20% of the service fee (after annual deductible is met).  Patient agreed to services and verbal consent obtained.   Follow up plan: Telephone appointment with care management team member scheduled for:01/14/2020  Penne Lash, RMA Care Guide, Embedded Care Coordination [facility]  [place], [place] [address] Direct Dial: [phone] [email or web address] Website: [email or web address]

## 2019-12-24 ENCOUNTER — Telehealth: Payer: Self-pay | Admitting: Family Medicine

## 2019-12-24 ENCOUNTER — Telehealth: Payer: Self-pay

## 2019-12-24 NOTE — Telephone Encounter (Signed)
Pt notified, will come in for labs

## 2019-12-24 NOTE — Telephone Encounter (Signed)
Pt.notified

## 2019-12-24 NOTE — Telephone Encounter (Signed)
Patient called in with a concern she may have had a reaction from the use of the Voltaren topical she uses for arthritic pain. Note-she has been using this topical as directed for 17 days and yesterday was the 1st time she experienced the following just after applying the gel: became very nervous with hypersensitivity that lasted several hours.  Then had a brief moment of dizziness, no vertigo. Resolved quickly and has not occurred since last night.  Next, during the night she experienced CP she describes as a heaviness, no sweating or SOB with episode and lasted less than one minute and did not radiate. Took tylenol and went back to sleep. No fever/weakness/no covid-like symptoms.  Discussed with the patient if any of these symptoms appear again she should call back for an appointment.  Not knowing if the Voltaren caused these symptoms, she will hold off using it until she hears back from physician.  Please advise if we need to see her at this time since all symptoms have resolved and if okay to continue to use the Voltaren topical gel as directed.  Routing to office for further advice.

## 2019-12-24 NOTE — Telephone Encounter (Signed)
It was from Dr. Sanda Klein: diclofenac sodium (VOLTAREN) 1 % GEL (Discontinued) 100 g 5 05/17/2018 02/13/2019   Sig - Route: Apply 2-4 g topically 4 (four) times daily. - Topical   Patient not taking: Reported on 02/13/2019       Sent to pharmacy as: diclofenac sodium (VOLTAREN) 1 % Gel   Reason for Discontinue: No longer needed (for PRN medications)   E-Prescribing Status: Receipt confirmed by pharmacy (05/17/2018 10:47 AM EDT)

## 2019-12-24 NOTE — Telephone Encounter (Signed)
Pt notified, is there anything else you recommend for oseto arthritis OTC or a rx you would give to her at a office visit?  Or a referral? She is having a hard time.

## 2019-12-24 NOTE — Telephone Encounter (Signed)
I do not see on current med list, she needs to stop using, and does she need to contact provider prescribing?

## 2019-12-24 NOTE — Telephone Encounter (Signed)
Patient states while taking Voltaren she is experiencing high levels of nervousness, slight dizziness, some chest pain. Side effects just started last night, and has been taking topical gel for 17 days, wants to know if she should still continue taking it, even though it has helped with arthritis pain.  Patient would like a resolution as soon as possible.

## 2020-01-08 ENCOUNTER — Ambulatory Visit: Payer: Medicare Other | Admitting: Interventional Cardiology

## 2020-01-14 ENCOUNTER — Other Ambulatory Visit: Payer: Self-pay

## 2020-01-14 ENCOUNTER — Ambulatory Visit: Payer: Medicare Other | Admitting: Pharmacist

## 2020-01-14 DIAGNOSIS — E785 Hyperlipidemia, unspecified: Secondary | ICD-10-CM

## 2020-01-14 DIAGNOSIS — I10 Essential (primary) hypertension: Secondary | ICD-10-CM

## 2020-01-14 NOTE — Chronic Care Management (AMB) (Signed)
Chronic Care Management Pharmacy  Name: Angel [name]  MRN: [id number] DOB: 27-May-1931  Chief Complaint/ HPI  Angel [name],  86 y.o. , female presents for their Initial CCM visit with the clinical pharmacist via telephone due to COVID-19 Pandemic.  PCP : Delsa Grana, PA-C  Their chronic conditions include: HTN, HLD, CKD3, HRT  Office Visits: 1/8 HTN, [place], premarin refill (H 1976), A1c 6%, severe OA  Consult Visit: 4/14 chest pain, [place], BP 144/60 P 87 Wt 190 BMI 32.7, SOBOE, 2 pillows, EKG NSR, inc Toprol XL 75mg  daily, Rx NTG SL  Medications: Outpatient Encounter Medications as of 01/14/2020  Medication Sig  . amLODipine (NORVASC) 5 MG tablet TAKE 1 TABLET BY MOUTH DAILY  . Ascorbic Acid (VITAMIN C) 100 MG tablet Take 500 mg by mouth daily. Gummy 750mg   . atorvastatin (LIPITOR) 40 MG tablet TAKE ONE TABLET BY MOUTH EVERY NIGHT AT BEDTIME  . clobetasol cream (TEMOVATE) 3.81 % Apply 1 application topically 2 (two) times daily as needed (rashes).  . estrogens, conjugated, (PREMARIN) 0.3 MG tablet Take one-half of a tablet by mouth twice a week  . glucosamine-chondroitin 500-400 MG tablet Take 1 tablet by mouth daily.  [name] lisinopril (ZESTRIL) 40 MG tablet Take 1 tablet (40 mg total) by mouth daily.  . meloxicam (MOBIC) 7.5 MG tablet TAKE 1 TABLET BY MOUTH EVERY DAY  . metoprolol succinate (TOPROL-XL) 50 MG 24 hr tablet Take 1.5 tablets (75 mg total) by mouth daily. Take with or immediately following a meal.  . Multiple Vitamin (MULTIVITAMIN) capsule Take 1 capsule by mouth daily.  . nitroGLYCERIN (NITROSTAT) 0.4 MG SL tablet Place 1 tablet (0.4 mg total) under the tongue every 5 (five) minutes as needed for chest pain.  . Omega-3 Fatty Acids (FISH OIL) 1000 MG CAPS Take by mouth. Krill oil  . vitamin B-12 (CYANOCOBALAMIN) 100 MCG tablet Take 1,000 mcg by mouth daily.   [name] VITAMIN D, ERGOCALCIFEROL, PO Take 1,000 Units by mouth daily.   No facility-administered encounter  medications on file as of 01/14/2020.     Financial Resource Strain: Low Risk   . Difficulty of Paying Living Expenses: Not hard at all   Current Diagnosis/Assessment:  Goals Addressed            This Visit's Progress   . Chronic Care Management       CARE PLAN ENTRY  Current Barriers:  . Chronic Disease Management support, education, and care coordination needs related to Hypertension, Hyperlipidemia, and Osteoarthritis   Hypertension . Pharmacist Clinical Goal(s): o Over the next 90 days, patient will work with PharmD and providers to maintain BP goal <140/90 . Current regimen:  o Amlodipine 5mg  at bedtime, lisinopril 40mg  at bedtime, metoprolol 75mg  twice daily with meals . Interventions: o Convert to pill packs to make timing simple . Patient self care activities - Over the next 90 days, patient will: o Check BP daily, document, and provide at future appointments o Ensure daily salt intake < 2300 mg/day  Hyperlipidemia . Pharmacist Clinical Goal(s): o Over the next 90 days, patient will work with PharmD and providers to achieve LDL goal < 70 . Current regimen:  o Atorvastatin 40mg  daily . Interventions: o None . Patient self care activities - Over the next 90 days, patient will: o Reduce triglycerides by eating less saturated fat, especially near bedtime  Chronic Pain . Pharmacist Clinical Goal(s) o Over the next 90 days, patient will work with PharmD and providers to optimize  pain management . Current regimen:  o Glucosamine/chondroitin 500/400 mg daily, meloxicam 7.5mg  daily . Interventions: o Tylenol 500mg  2 tabs by mouth three times daily o Salonpas lidocaine patches 12 hours on, 12 hours off (at night) . Patient self care activities - Over the next 90 days, patient will: o Investigate TENS unit for pain relief potential   Medication management . Pharmacist Clinical Goal(s): o Over the next 90 days, patient will work with PharmD and providers to maintain  optimal medication adherence . Current [facility] . Interventions o Comprehensive medication review performed. o Counseled that Premarin may be more expense than benefit o Utilize [facility] for medication synchronization, packaging and delivery . Patient self care activities - Over the next 90 days, patient will: o Focus on medication adherence by providing feedback on transition to [facility] pharmacy o Take medications as prescribed o Report any questions or concerns to PharmD and/or provider(s)  Initial goal documentation       Hypertension   Office blood pressures are  BP Readings from Last 3 Encounters:  11/20/19 (!) 144/60  04/18/19 126/80  02/13/19 132/72    Patient has failed these meds in the past: NA  Patient checks BP at home infrequently  Patient home BP readings are ranging: NA  We discussed  Takes amlodipine, lisinopril at bedtime  Plan  Continue current medications   Hyperlipidemia   Lipid Panel     Component Value Date/Time   CHOL 179 02/13/2019 1626   TRIG 314 (H) 02/13/2019 1626   HDL 42 02/13/2019 1626   LDLCALC 74 02/13/2019 1626   LDLCALC 82 01/18/2018 1416     The ASCVD Risk score (Goff DC Jr., et al., 2013) failed to calculate for the following reasons:   The 2013 ASCVD risk score is only valid for ages 90 to 37   Patient has failed these meds in past: NA Patient is currently controlled on the following medications:  . Lipitor 40mg  daily  We discussed:   Near goal  Plan  Continue current medications  Pain   Patient has failed these meds in past: Voltaren Patient is currently uncontrolled on the following medications: glucosamine, meloxicam  We discussed: Voltaren helpful, but had jittery reaction, chest pains D/c diclofenac gel per Lucio Edward Counseled on cardiac risk Difficulty walking with lower back pain Uses golf cart  Counseled on TENS, CBD oil Pain score (on movement, 3 - 4 times daily): 7 - 8 Takes Tylenol  at bedtime, will try routine meloxicam  Plan  Tylenol 1000mg  tid routinely Salonpas lidocaine patches  Medication Management   [facility] Uses pill box? Yes Pt endorses 100% compliance Interested in [facility]  We discussed:  Hasn't started Toprol XL inc yet Scoring technique Bites into Premarin to split, expensive Used to see Dr. Jacinto Reap Black elderberry gummies 8:30 - 9:00 10:30 - 11:00 pm  Plan  Utilize [facility] pharmacy for medication synchronization, packaging and delivery   Verbal consent obtained for [facility] enhanced pharmacy services (medication synchronization, adherence packaging, delivery coordination). A medication sync plan was created to allow patient to get all medications delivered once every 30 to 90 days per patient preference. Patient understands they have freedom to choose pharmacy and clinical pharmacist will coordinate care between all prescribers and [facility].  Follow up: 3 month phone visit  Milus Height, PharmD, [place], [facility] [phone]

## 2020-01-15 NOTE — Patient Instructions (Addendum)
Visit Information  Goals Addressed            This Visit's Progress   . Chronic Care Management       CARE PLAN ENTRY  Current Barriers:  . Chronic Disease Management support, education, and care coordination needs related to Hypertension, Hyperlipidemia, and Osteoarthritis   Hypertension . Pharmacist Clinical Goal(s): o Over the next 90 days, patient will work with PharmD and providers to maintain BP goal <140/90 . Current regimen:  o Amlodipine 5mg  at bedtime, lisinopril 40mg  at bedtime, metoprolol 75mg  twice daily with meals . Interventions: o Convert to pill packs to make timing simple . Patient self care activities - Over the next 90 days, patient will: o Check BP daily, document, and provide at future appointments o Ensure daily salt intake < 2300 mg/day  Hyperlipidemia . Pharmacist Clinical Goal(s): o Over the next 90 days, patient will work with PharmD and providers to achieve LDL goal < 70 . Current regimen:  o Atorvastatin 40mg  daily . Interventions: o None . Patient self care activities - Over the next 90 days, patient will: o Reduce triglycerides by eating less saturated fat, especially near bedtime  Chronic Pain . Pharmacist Clinical Goal(s) o Over the next 90 days, patient will work with PharmD and providers to optimize pain management . Current regimen:  o Glucosamine/chondroitin 500/400 mg daily, meloxicam 7.5mg  daily . Interventions: o Tylenol 500mg  2 tabs by mouth three times daily o Salonpas lidocaine patches 12 hours on, 12 hours off (at night) . Patient self care activities - Over the next 90 days, patient will: o Investigate TENS unit for pain relief potential   Medication management . Pharmacist Clinical Goal(s): o Over the next 90 days, patient will work with PharmD and providers to maintain optimal medication adherence . Current pharmacy: [facility] . Interventions o Comprehensive medication review performed. o Counseled that Premarin may be more  expense than benefit o Utilize [facility] for medication synchronization, packaging and delivery . Patient self care activities - Over the next 90 days, patient will: o Focus on medication adherence by providing feedback on transition to [facility] o Take medications as prescribed o Report any questions or concerns to PharmD and/or provider(s)  Initial goal documentation        [name] was given information about Chronic Care Management services today including:  1. CCM service includes personalized support from designated clinical staff supervised by her physician, including individualized plan of care and coordination with other care providers 2. 24/7 contact phone numbers for assistance for urgent and routine care needs. 3. Standard insurance, coinsurance, copays and deductibles apply for chronic care management only during months in which we provide at least 20 minutes of these services. Most insurances cover these services at 100%, however patients may be responsible for any copay, coinsurance and/or deductible if applicable. This service may help you avoid the need for more expensive face-to-face services. 4. Only one practitioner may furnish and bill the service in a calendar month. 5. The patient may stop CCM services at any time (effective at the end of the month) by phone call to the office staff.  Patient agreed to services and verbal consent obtained.   Print copy of patient instructions provided.  Telephone follow up appointment with pharmacy team member scheduled for: 3 months  Milus Height, PharmD, [place], [facility] [phone] Arthritis Arthritis is a term that is commonly used to refer to joint pain or joint disease. There  are more than 100 types of arthritis. What are the causes? The most common cause of this condition is wear and tear of a joint. Other causes include:  Gout.  Inflammation of a joint.  An  infection of a joint.  Sprains and other injuries near the joint.  A reaction to medicines or drugs, or an allergic reaction. In some cases, the cause may not be known. What are the signs or symptoms? The main symptom of this condition is pain in the joint during movement. Other symptoms include:  Redness, swelling, or stiffness at a joint.  Warmth coming from the joint.  Fever.  Overall feeling of illness. How is this diagnosed? This condition may be diagnosed with a physical exam and tests, including:  Blood tests.  Urine tests.  Imaging tests, such as X-rays, an MRI, or a CT scan. Sometimes, fluid is removed from a joint for testing. How is this treated? This condition may be treated with:  Treatment of the cause, if it is known.  Rest.  Raising (elevating) the joint.  Applying cold or hot packs to the joint.  Medicines to improve symptoms and reduce inflammation.  Injections of a steroid such as cortisone into the joint to help reduce pain and inflammation. Depending on the cause of your arthritis, you may need to make lifestyle changes to reduce stress on your joint. Changes may include:  Exercising more.  Losing weight. Follow these instructions at home: Medicines  Take over-the-counter and prescription medicines only as told by your health care provider.  Do not take aspirin to relieve pain if your health care provider thinks that gout may be causing your pain. Activity  Rest your joint if told by your health care provider. Rest is important when your disease is active and your joint feels painful, swollen, or stiff.  Avoid activities that make the pain worse. It is important to balance activity with rest.  Exercise your joint regularly with range-of-motion exercises as told by your health care provider. Try doing low-impact exercise, such as: ? Swimming. ? Water aerobics. ? Biking. ? Walking. Managing pain, stiffness, and swelling      If  directed, put ice on the joint. ? Put ice in a plastic bag. ? Place a towel between your skin and the bag. ? Leave the ice on for 20 minutes, 2-3 times per day.  If your joint is swollen, raise (elevate) it above the level of your heart if directed by your health care provider.  If your joint feels stiff in the morning, try taking a warm shower.  If directed, apply heat to the affected area as often as told by your health care provider. Use the heat source that your health care provider recommends, such as a moist heat pack or a heating pad. If you have diabetes, do not apply heat without permission from your health care provider. To apply heat: ? Place a towel between your skin and the heat source. ? Leave the heat on for 20-30 minutes. ? Remove the heat if your skin turns bright red. This is especially important if you are unable to feel pain, heat, or cold. You may have a greater risk of getting burned. General instructions  Do not use any products that contain nicotine or tobacco, such as cigarettes, e-cigarettes, and chewing tobacco. If you need help quitting, ask your health care provider.  Keep all follow-up visits as told by your health care provider. This is important. Contact a health care  provider if:  The pain gets worse.  You have a fever. Get help right away if:  You develop severe joint pain, swelling, or redness.  Many joints become painful and swollen.  You develop severe back pain.  You develop severe weakness in your leg.  You cannot control your bladder or bowels. Summary  Arthritis is a term that is commonly used to refer to joint pain or joint disease. There are more than 100 types of arthritis.  The most common cause of this condition is wear and tear of a joint. Other causes include gout, inflammation or infection of the joint, sprains, or allergies.  Symptoms of this condition include redness, swelling, or stiffness of the joint. Other symptoms include  warmth, fever, or feeling ill.  This condition is treated with rest, elevation, medicines, and applying cold or hot packs.  Follow your health care provider's instructions about medicines, activity, exercises, and other home care treatments. This information is not intended to replace advice given to you by your health care provider. Make sure you discuss any questions you have with your health care provider. Document Revised: 07/02/2018 Document Reviewed: 07/02/2018 Elsevier Patient Education  2020 [employer].

## 2020-01-23 NOTE — Progress Notes (Signed)
Cardiology Office Note:    Date:  01/27/2020   ID:  Synethia, [place] 04/17/1931, MRN [id number]  PCP:  Delsa Grana, PA-C  Cardiologist:  Sinclair Grooms, MD   Referring MD: Delsa Grana, PA-C   Chief Complaint  Patient presents with  . Coronary Artery Disease  . Advice Only    Chest pain    History of Present Illness:    Angel [name] is a 86 y.o. female with a hx of poor R-wave progression on EKG, prior history of abnormal myocardial perfusion study, question of small apical infarct or diverticulum by cardiac MRI, essential hypertension, hyperlipidemia, aortic stenosisandaortic enlargement.  Recent episode of CP evaluated by Richardson Dopp 11/19/19.  She refused any cardiac work-up.  The chest discomfort lasted approximately 10 minutes.  The EKG did not reveal any significant abnormalities.  Since being seen she has had no recurrence and she feels that the discomfort was possibly related to the use of Voltaren.  She denies swelling, orthopnea, PND, and edema.  Past Medical History:  Diagnosis Date  . Anxiety   . Aortic aneurysm without rupture ([place])   . CAD (coronary artery disease)   . Chronic hip pain   . Endometriosis   . Hearing loss   . HTN, goal below 150/90   . Hyperlipidemia LDL goal <70 11/18/2014  . Osteopenia of the elderly   . Urge and stress incontinence     Past Surgical History:  Procedure Laterality Date  . ABDOMINAL HYSTERECTOMY  1976   total  . APPENDECTOMY    . BREAST EXCISIONAL BIOPSY Left 1987   benign  . CATARACT EXTRACTION, BILATERAL  08/2018   Dr. Manuella Ghazi with [facility]    Current Medications: Current Meds  Medication Sig  . amLODipine (NORVASC) 5 MG tablet TAKE 1 TABLET BY MOUTH DAILY  . Ascorbic Acid (VITAMIN C) 100 MG tablet Take 500 mg by mouth daily. Gummy 750mg   . atorvastatin (LIPITOR) 40 MG tablet TAKE ONE TABLET BY MOUTH EVERY NIGHT AT BEDTIME  . clobetasol cream (TEMOVATE) 3.66 % Apply 1 application topically 2  (two) times daily as needed (rashes).  . estrogens, conjugated, (PREMARIN) 0.3 MG tablet Take one-half of a tablet by mouth twice a week  . glucosamine-chondroitin 500-400 MG tablet Take 1 tablet by mouth daily.  [name] lisinopril (ZESTRIL) 40 MG tablet Take 1 tablet (40 mg total) by mouth daily.  . meloxicam (MOBIC) 7.5 MG tablet TAKE 1 TABLET BY MOUTH EVERY DAY  . metoprolol succinate (TOPROL-XL) 50 MG 24 hr tablet Take 1.5 tablets (75 mg total) by mouth daily. Take with or immediately following a meal.  . Multiple Vitamin (MULTIVITAMIN) capsule Take 1 capsule by mouth daily.  . nitroGLYCERIN (NITROSTAT) 0.4 MG SL tablet Place 1 tablet (0.4 mg total) under the tongue every 5 (five) minutes as needed for chest pain.  . Omega-3 Fatty Acids (FISH OIL) 1000 MG CAPS Take by mouth. Krill oil  . vitamin B-12 (CYANOCOBALAMIN) 100 MCG tablet Take 1,000 mcg by mouth daily.   [name] VITAMIN D, ERGOCALCIFEROL, PO Take 1,000 Units by mouth daily.     Allergies:   Codeine, Levofloxacin, Myrbetriq [mirabegron], Prednisone, and Sulfa antibiotics   Social History   Socioeconomic History  . Marital status: Widowed    Spouse name: Not on file  . Number of children: 3  . Years of education: Not on file  . Highest education level: Not on file  Occupational History  . Not  on file  Tobacco Use  . Smoking status: Never Smoker  . Smokeless tobacco: Never Used  Vaping Use  . Vaping Use: Never used  Substance and Sexual Activity  . Alcohol use: Yes    Alcohol/week: 1.0 standard drink    Types: 1 Cans of beer per week    Comment: rare  . Drug use: No  . Sexual activity: Not on file  Other Topics Concern  . Not on file  Social History Narrative  . Not on file   Social Determinants of Health   Financial Resource Strain: Low Risk   . Difficulty of Paying Living Expenses: Not hard at all  Food Insecurity: No Food Insecurity  . Worried About Charity fundraiser in the Last Year: Never true  . Ran Out of Food  in the Last Year: Never true  Transportation Needs: No Transportation Needs  . Lack of Transportation (Medical): No  . Lack of Transportation (Non-Medical): No  Physical Activity: Insufficiently Active  . Days of Exercise per Week: 7 days  . Minutes of Exercise per Session: 20 min  Stress: No Stress Concern Present  . Feeling of Stress : Only a little  Social Connections: Unknown  . Frequency of Communication with Friends and Family: Patient refused  . Frequency of Social Gatherings with Friends and Family: Patient refused  . Attends Religious Services: Patient refused  . Active Member of Clubs or Organizations: Patient refused  . Attends Archivist Meetings: Patient refused  . Marital Status: Widowed     Family History: The patient's family history includes Breast cancer (age of onset: 59) in her maternal grandmother; CVA in her mother; Cancer in her brother; Heart disease in her maternal grandmother; Stroke in her mother.  ROS:   Please see the history of present illness.    No edema.  Being troubled by arthritis.  All other systems reviewed and are negative.  EKGs/Labs/Other Studies Reviewed:    The following studies were reviewed today: 2D Doppler echocardiogram/20 04/2019: IMPRESSIONS    1. The left ventricle has normal systolic function with an ejection  fraction of 60-65%. The cavity size was normal. There is mildly increased  left ventricular wall thickness. Left ventricular diastolic Doppler  parameters are consistent with impaired  relaxation. Elevated mean left atrial pressure.  2. The right ventricle has normal systolic function. The cavity was  normal.  3. The mitral valve is abnormal. Mild thickening of the mitral valve  leaflet. There is mild mitral annular calcification present.  4. The tricuspid valve is grossly normal.  5. There is mild dilatation of the ascending aorta measuring 38 mm.  6. Normal LV systolic function; mild diastolic  dysfunction; mild LVH;  mildly dilated ascending aorta; calcified aortic valve with mild AS (mean  gradient 13 mmHg).   EKG:  EKG sinus bradycardia with left anterior hemiblock.  Tracing was performed in April 2021.  Recent Labs: 02/13/2019: ALT 21 04/18/2019: BUN 28; Creat 0.73; Potassium 4.2; Sodium 139  Recent Lipid Panel    Component Value Date/Time   CHOL 179 02/13/2019 1626   TRIG 314 (H) 02/13/2019 1626   HDL 42 02/13/2019 1626   CHOLHDL 4.3 02/13/2019 1626   CHOLHDL 3.3 01/18/2018 1416   VLDL 70 (H) 08/04/2016 1601   LDLCALC 74 02/13/2019 1626   LDLCALC 82 01/18/2018 1416    Physical Exam:    VS:  BP 128/62   Pulse 89   Ht 5\' 4"  (1.626 m)  Wt 194 lb 12.8 oz (88.4 kg)   LMP  (LMP Unknown)   SpO2 93%   BMI 33.44 kg/m     Wt Readings from Last 3 Encounters:  01/27/20 194 lb 12.8 oz (88.4 kg)  11/20/19 190 lb 6.4 oz (86.4 kg)  08/16/19 181 lb (82.1 kg)     GEN: Moderate obesity. No acute distress HEENT: Normal NECK: No JVD. LYMPHATICS: No lymphadenopathy CARDIAC: 2/6 systolic murmur left mid sternal border.  RRR without diastolic murmur, gallop, or edema. VASCULAR:  Normal Pulses. No bruits. RESPIRATORY:  Clear to auscultation without rales, wheezing or rhonchi  ABDOMEN: Soft, non-tender, non-distended, No pulsatile mass, MUSCULOSKELETAL: No deformity  SKIN: Warm and dry NEUROLOGIC:  Alert and oriented x 3 PSYCHIATRIC:  Normal affect   ASSESSMENT:    1. Other chest pain   2. Aortic valve stenosis, etiology of cardiac valve disease unspecified   3. Coronary artery disease involving native coronary artery of native heart without angina pectoris   4. Essential hypertension   5. Hyperlipidemia LDL goal <70   6. Aortic aneurysm without rupture, unspecified portion of aorta ([place])   7. Educated about COVID-19 virus infection    PLAN:    In order of problems listed above:  1. Systolic murmur still present.  No symptoms suggestive of heart failure or  arrhythmia.  Will perform echo in 2022. 2. Symptoms have not recurred and unlikely to represent angina under the circumstances that they occurred.  Clinical observation for now. 3. Blood pressure control is excellent.  No change in therapy. 4. LDL goal is 74.  Continue Lipitor 40 mg/day. 5. We did not discuss the mildly dilated aorta. 6. COVID-19 vaccine has been received.  1 year follow-up   Medication Adjustments/Labs and Tests Ordered: Current medicines are reviewed at length with the patient today.  Concerns regarding medicines are outlined above.  No orders of the defined types were placed in this encounter.  No orders of the defined types were placed in this encounter.   Patient Instructions  Medication Instructions:  Your physician recommends that you continue on your current medications as directed. Please refer to the Current Medication list given to you today.  *If you need a refill on your cardiac medications before your next appointment, please call your pharmacy*   Lab Work: None  If you have labs (blood work) drawn today and your tests are completely normal, you will receive your results only by: [name] MyChart Message (if you have MyChart) OR . A paper copy in the mail If you have any lab test that is abnormal or we need to change your treatment, we will call you to review the results.   Testing/Procedures: None   Follow-Up: At [facility], you and your health needs are our priority.  As part of our continuing mission to provide you with exceptional heart care, we have created designated Provider Care Teams.  These Care Teams include your primary Cardiologist (physician) and Advanced Practice Providers (APPs -  Physician Assistants and Nurse Practitioners) who all work together to provide you with the care you need, when you need it.  We recommend signing up for the patient portal called "MyChart".  Sign up information is provided on this After Visit Summary.   MyChart is used to connect with patients for Virtual Visits (Telemedicine).  Patients are able to view lab/test results, encounter notes, upcoming appointments, etc.  Non-urgent messages can be sent to your provider as well.   To learn more  about what you can do with MyChart, go to [email or web address].    Your next appointment:   12 month(s)  The format for your next appointment:   In Person  Provider:   You may see Sinclair Grooms, MD or one of the following Advanced Practice Providers on your designated Care Team:    Truitt Merle, NP  Cecilie Kicks, NP  Kathyrn Drown, NP    Other Instructions      Signed, Sinclair Grooms, MD  01/27/2020 4:01 PM    [place]

## 2020-01-24 ENCOUNTER — Other Ambulatory Visit: Payer: Self-pay | Admitting: Family Medicine

## 2020-01-24 DIAGNOSIS — Z79890 Hormone replacement therapy (postmenopausal): Secondary | ICD-10-CM

## 2020-01-27 ENCOUNTER — Other Ambulatory Visit: Payer: Self-pay

## 2020-01-27 ENCOUNTER — Ambulatory Visit (INDEPENDENT_AMBULATORY_CARE_PROVIDER_SITE_OTHER): Payer: Medicare Other | Admitting: Interventional Cardiology

## 2020-01-27 ENCOUNTER — Encounter: Payer: Self-pay | Admitting: Interventional Cardiology

## 2020-01-27 VITALS — BP 128/62 | HR 89 | Ht 64.0 in | Wt 194.8 lb

## 2020-01-27 DIAGNOSIS — I10 Essential (primary) hypertension: Secondary | ICD-10-CM | POA: Diagnosis not present

## 2020-01-27 DIAGNOSIS — I350 Nonrheumatic aortic (valve) stenosis: Secondary | ICD-10-CM | POA: Diagnosis not present

## 2020-01-27 DIAGNOSIS — I2510 Atherosclerotic heart disease of native coronary artery without angina pectoris: Secondary | ICD-10-CM | POA: Diagnosis not present

## 2020-01-27 DIAGNOSIS — Z7189 Other specified counseling: Secondary | ICD-10-CM

## 2020-01-27 DIAGNOSIS — R0789 Other chest pain: Secondary | ICD-10-CM

## 2020-01-27 DIAGNOSIS — I719 Aortic aneurysm of unspecified site, without rupture: Secondary | ICD-10-CM

## 2020-01-27 DIAGNOSIS — E785 Hyperlipidemia, unspecified: Secondary | ICD-10-CM

## 2020-01-27 NOTE — Patient Instructions (Signed)

## 2020-01-31 ENCOUNTER — Other Ambulatory Visit: Payer: Self-pay

## 2020-01-31 ENCOUNTER — Ambulatory Visit (INDEPENDENT_AMBULATORY_CARE_PROVIDER_SITE_OTHER): Payer: Medicare Other | Admitting: Podiatry

## 2020-01-31 DIAGNOSIS — M2042 Other hammer toe(s) (acquired), left foot: Secondary | ICD-10-CM

## 2020-01-31 DIAGNOSIS — M21612 Bunion of left foot: Secondary | ICD-10-CM | POA: Diagnosis not present

## 2020-01-31 DIAGNOSIS — M76821 Posterior tibial tendinitis, right leg: Secondary | ICD-10-CM | POA: Diagnosis not present

## 2020-01-31 DIAGNOSIS — M76822 Posterior tibial tendinitis, left leg: Secondary | ICD-10-CM | POA: Diagnosis not present

## 2020-01-31 MED ORDER — MELOXICAM 15 MG PO TABS: 15.0000 mg | ORAL_TABLET | Freq: Every day | ORAL | 2 refills | Status: DC

## 2020-01-31 NOTE — Progress Notes (Signed)
   Subjective: 86 y.o. female presenting today for follow up evaluation of bilateral foot pain.  Patient states that over the past 3 months she has had a slow return of pain.  She states that the meloxicam does help.  She also states that the injections help.  She was provided a toe splint last visit to alleviate pressure from the left second hammertoe however she states it is not helpful.  She is very active 86 year old female and wears Ortho feet shoes  Past Medical History:  Diagnosis Date  . Anxiety   . Aortic aneurysm without rupture ([place])   . CAD (coronary artery disease)   . Chronic hip pain   . Endometriosis   . Hearing loss   . HTN, goal below 150/90   . Hyperlipidemia LDL goal <70 11/18/2014  . Osteopenia of the elderly   . Urge and stress incontinence      Objective: Physical Exam General: The patient is alert and oriented x3 in no acute distress.  Dermatology: Skin is cool, dry and supple bilateral lower extremities. Negative for open lesions or macerations.  Vascular: Palpable pedal pulses bilaterally. No edema or erythema noted. Capillary refill within normal limits.  Neurological: Epicritic and protective threshold grossly intact bilaterally.   Musculoskeletal Exam: Clinical evidence of bunion deformity noted to the respective foot. There is moderate pain on palpation range of motion of the first MPJ. Lateral deviation of the hallux noted consistent with hallux abductovalgus. Hammertoe contracture also noted on clinical exam to the 2nd digit of the left foot. Symptomatic pain on palpation and range of motion also noted to the metatarsal phalangeal joints of the respective hammertoe digits.   Pain on palpation noted to the posterior tibial tendon of the bilateral foot with medial longitudinal arch collapse noted.  Assessment: 1. HAV w/ bunion deformity left 2. Hammertoe deformity 2nd digit left 3. PTTD bilateral 4.  Posterior tibial tendinitis bilateral 5.  Plantar  fasciitis left-resolved   Plan of Care:  1. Patient was evaluated.  2. Injection of 0.5 mLs Celestone Soluspan injected into the posterior tibial tendon sheath of the bilateral foot.  3.  Refill prescription for meloxicam 15 mg daily.  4. Continue wearing Orthofeet shoes.  5.  Discontinue Darco toe splint 7. 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     Edrick Kins, DPM [facility]  Dr. Edrick Kins, [address]                                        [place],  [address]                Office [phone]  Fax [phone]

## 2020-03-11 ENCOUNTER — Other Ambulatory Visit: Payer: Self-pay | Admitting: Interventional Cardiology

## 2020-03-24 ENCOUNTER — Other Ambulatory Visit: Payer: Self-pay | Admitting: Interventional Cardiology

## 2020-03-24 ENCOUNTER — Other Ambulatory Visit: Payer: Self-pay | Admitting: Physician Assistant

## 2020-04-16 ENCOUNTER — Telehealth: Payer: Self-pay

## 2020-04-16 NOTE — Chronic Care Management (AMB) (Deleted)
   Chronic Care Management Pharmacy  Name: Angel [name]  MRN: [id number] DOB: [birth year]  Chief Complaint/ HPI  Angel [name],  86 y.o. , female presents for their Follow-Up CCM visit with the clinical pharmacist via telephone due to COVID-19 Pandemic.  PCP : Delsa Grana, PA-C  Their chronic conditions include: {CHL AMB CHRONIC MEDICAL CONDITIONS:[phone]}  Office Visits:***  Consult Visit:***  Medications: Outpatient Encounter Medications as of 04/16/2020  Medication Sig  . amLODipine (NORVASC) 5 MG tablet TAKE 1 TABLET BY MOUTH DAILY  . Ascorbic Acid (VITAMIN C) 100 MG tablet Take 500 mg by mouth daily. Gummy 750mg   . atorvastatin (LIPITOR) 40 MG tablet TAKE ONE TABLET BY MOUTH EVERY NIGHT AT BEDTIME  . clobetasol cream (TEMOVATE) 6.38 % Apply 1 application topically 2 (two) times daily as needed (rashes).  . estrogens, conjugated, (PREMARIN) 0.3 MG tablet TAKE ONE-HALF OF A TABLET BY MOUTH TWICE A WEEK  . glucosamine-chondroitin 500-400 MG tablet Take 1 tablet by mouth daily.  [name] lisinopril (ZESTRIL) 40 MG tablet TAKE 1 TABLET BY MOUTH EVERY DAY  . metoprolol succinate (TOPROL-XL) 50 MG 24 hr tablet TAKE 1 & 1/2 TABLET BY MOUTH DAILY TAKE WITH OR IMMEDIATELY FOLLOWING A MEAL  . Multiple Vitamin (MULTIVITAMIN) capsule Take 1 capsule by mouth daily.  . nitroGLYCERIN (NITROSTAT) 0.4 MG SL tablet Place 1 tablet (0.4 mg total) under the tongue every 5 (five) minutes as needed for chest pain.  . Omega-3 Fatty Acids (FISH OIL) 1000 MG CAPS Take by mouth. Krill oil  . vitamin B-12 (CYANOCOBALAMIN) 100 MCG tablet Take 1,000 mcg by mouth daily.   [name] VITAMIN D, ERGOCALCIFEROL, PO Take 1,000 Units by mouth daily.   No facility-administered encounter medications on file as of 04/16/2020.     Current Diagnosis/Assessment:  Goals Addressed   None     {CHL [facility] Diagnosis/Assessment:[phone]}

## 2020-04-20 ENCOUNTER — Telehealth: Payer: Self-pay | Admitting: *Deleted

## 2020-04-20 NOTE — Chronic Care Management (AMB) (Signed)
  Care Management   Note  04/20/2020 Name: IZZAH PASQUA MRN: [id number] DOB: 1930-08-16  KINESHA AUTEN is a 86 y.o. year old female who is a primary care patient of Laurell Roof and is actively engaged with the care management team. I reached out to [name] by phone today to assist with re-scheduling a follow up visit with the Pharmacist.  Follow up plan: Telephone appointment with care management team member scheduled for:05/06/2020  [place] [facility]

## 2020-04-20 NOTE — Chronic Care Management (AMB) (Signed)
  Chronic Care Management   Note  04/20/2020 Name: Angel [name] MRN: [id number] DOB: [birth year]  Angel [name] is a 86 y.o. year old female who is a primary care patient of Laurell Roof and is actively engaged with the care management team. I reached out to [name] by phone today to assist with re-scheduling a follow up visit with the Pharmacist.  Follow up plan: Unsuccessful telephone outreach attempt made. A HIPAA compliant phone message was left for the patient providing contact information and requesting a return call. The care management team will reach out to the patient again over the next 7 days. If patient returns call to provider office, please advise to call [facility] at [phone].  [facility]

## 2020-05-06 ENCOUNTER — Ambulatory Visit: Payer: Medicare Other | Admitting: Pharmacist

## 2020-05-06 ENCOUNTER — Other Ambulatory Visit: Payer: Self-pay

## 2020-05-06 DIAGNOSIS — I10 Essential (primary) hypertension: Secondary | ICD-10-CM

## 2020-05-06 DIAGNOSIS — E785 Hyperlipidemia, unspecified: Secondary | ICD-10-CM

## 2020-05-06 NOTE — Chronic Care Management (AMB) (Signed)
Chronic Care Management Pharmacy  Name: Angel [name]  MRN: [id number] DOB: 01-14-1931  Chief Complaint/ HPI  Angel [name],  86 y.o. , female presents for their Follow-Up CCM visit with the clinical pharmacist via telephone due to COVID-19 Pandemic.  PCP : Delsa Grana, PA-C  Their chronic conditions include: HTN, HLD  Office Visits: NA  Consult Visit: 6/25 Tendon dysfunction, Evans, Soluspan inj, cont meloxicam 6/22 OA, [place], BP 120/70, knee inj  Medications: Outpatient Encounter Medications as of 05/06/2020  Medication Sig  . amLODipine (NORVASC) 5 MG tablet TAKE 1 TABLET BY MOUTH DAILY  . Ascorbic Acid (VITAMIN C) 100 MG tablet Take 500 mg by mouth daily. Gummy 742m  . atorvastatin (LIPITOR) 40 MG tablet TAKE ONE TABLET BY MOUTH EVERY NIGHT AT BEDTIME  . clobetasol cream (TEMOVATE) 03.54% Apply 1 application topically 2 (two) times daily as needed (rashes).  . estrogens, conjugated, (PREMARIN) 0.3 MG tablet TAKE ONE-HALF OF A TABLET BY MOUTH TWICE A WEEK  . glucosamine-chondroitin 500-400 MG tablet Take 1 tablet by mouth daily.  .Marland Kitchenlisinopril (ZESTRIL) 40 MG tablet TAKE 1 TABLET BY MOUTH EVERY DAY  . metoprolol succinate (TOPROL-XL) 50 MG 24 hr tablet TAKE 1 & 1/2 TABLET BY MOUTH DAILY TAKE WITH OR IMMEDIATELY FOLLOWING A MEAL  . Multiple Vitamin (MULTIVITAMIN) capsule Take 1 capsule by mouth daily.  . nitroGLYCERIN (NITROSTAT) 0.4 MG SL tablet Place 1 tablet (0.4 mg total) under the tongue every 5 (five) minutes as needed for chest pain.  . Omega-3 Fatty Acids (FISH OIL) 1000 MG CAPS Take by mouth. Krill oil  . vitamin B-12 (CYANOCOBALAMIN) 100 MCG tablet Take 1,000 mcg by mouth daily.   .Marland KitchenVITAMIN D, ERGOCALCIFEROL, PO Take 1,000 Units by mouth daily.   No facility-administered encounter medications on file as of 05/06/2020.     Financial Resource Strain: Low Risk   . Difficulty of Paying Living Expenses: Not hard at all    Current Diagnosis/Assessment:  Goals  Addressed   None    Hypertension   BP goal is:  <140/90  Office blood pressures are  BP Readings from Last 3 Encounters:  01/27/20 128/62  11/20/19 (!) 144/60  04/18/19 126/80   Patient checks BP at home infrequently Patient home BP readings are ranging: NA  Patient has failed these meds in the past: NA Patient is currently controlled on the following medications:  . Amlodipine 516mdaily . Lisinopril 4093maily . Metoprolol succinate 51m51mily with meals  We discussed: At goal Denies hypotension Tolerating June increase of Toprol XL 51mg65mly Meloxicam daily in the morning Ibuprofen 400mg 89m counseled  Plan  Continue current medications   Hyperlipidemia   LDL goal < 100  Lipid Panel     Component Value Date/Time   CHOL 179 02/13/2019 1626   TRIG 314 (H) 02/13/2019 1626   HDL 42 02/13/2019 1626   LDLCALC 74 02/13/2019 1626   LDLCALC 82 01/18/2018 1416    Hepatic Function Latest Ref Rng & Units 02/13/2019 01/18/2018 08/04/2016  Total Protein 6.0 - 8.5 g/dL 6.5 6.5 6.7  Albumin 3.6 - 4.6 g/dL 4.1 - 3.9  AST 0 - 40 IU/L _0 ALT 0 - 32 IU/L _1 Alk Phosphatase 39 - 117 IU/L 51 - 44  Total Bilirubin 0.0 - 1.2 mg/dL 0.2 0.3 0.3  Bilirubin, Direct 0.00 - 0.40 mg/dL 0.09 - -     The ASCVD Risk score (Goff DBrooke  et al., 2013) failed to calculate for the following reasons:   The 2013 ASCVD risk score is only valid for ages 47 to 29   Patient has failed these meds in past: NA Patient is currently controlled on the following medications:  . Lipitor 97m qhs  We discussed:   At goal Denies myalgias  Plan  Continue current medications  Medication Management   Pt [facility] medications Uses pill box? Yes Pt endorses 100% compliance   We discussed:  Agreed to [facility], then changed her mind. Felt that our services imply that she's too feeble to take care of the medication herself.  Voltaren? No TENS, CBD oil? No TENS,  Joint 3X biotrust, injections didn't make much difference Routine Tylenol? No, not helpful Premarin already tapered down  Plan  Self d/c Premarin  Follow up: 3 month phone visit  TMilus Height PharmD, [phone]

## 2020-05-07 ENCOUNTER — Other Ambulatory Visit: Payer: Self-pay | Admitting: Podiatry

## 2020-05-07 NOTE — Patient Instructions (Addendum)
Visit Information  Goals Addressed            This Visit's Progress   . Chronic Care Management       CARE PLAN ENTRY  Current Barriers:  . Chronic Disease Management support, education, and care coordination needs related to Hypertension, Hyperlipidemia, and Osteoarthritis   Hypertension . Pharmacist Clinical Goal(s): o Over the next 90 days, patient will work with PharmD and providers to maintain BP goal <140/90 . Current regimen:  o Amlodipine 5mg  at bedtime o lisinopril 40mg  at bedtime o metoprolol 75mg  daily with meals . Interventions: o Please stop ibuprofen 200mg  2 tabs at bedtime . Patient self care activities - Over the next 90 days, patient will: o Check BP daily, document, and provide at future appointments o Ensure daily salt intake < 2300 mg/day  Hyperlipidemia . Pharmacist Clinical Goal(s): o Over the next 90 days, patient will work with PharmD and providers to achieve LDL goal < 70 . Current regimen:  o Atorvastatin 40mg  daily . Interventions: o None . Patient self care activities - Over the next 90 days, patient will: o Reduce triglycerides by eating less saturated fat, especially near bedtime  Chronic Pain . Pharmacist Clinical Goal(s) o Over the next 90 days, patient will work with PharmD and providers to optimize pain management . Current regimen:  o Glucosamine/chondroitin 500/400 mg daily,  o meloxicam 7.5mg  daily o Ibuprofen 400mg  at bedtime . Interventions: o Tylenol 500mg  2 tabs by mouth three times daily o Salonpas lidocaine patches 12 hours on, 12 hours off (at night) . Patient self care activities - Over the next 90 days, patient will: o Investigate TENS unit for pain relief potential   Medication management . Pharmacist Clinical Goal(s): o Over the next 90 days, patient will work with PharmD and providers to maintain optimal medication adherence . [facility] . Interventions o Comprehensive medication review  performed. o Patient stopped Premarin . Patient self care activities - Over the next 90 days, patient will: o Focus on medication adherence by providing feedback on transition to [facility] pharmacy o Take medications as prescribed o Report any questions or concerns to PharmD and/or provider(s)  Initial goal documentation        Print copy of patient instructions provided.   Telephone follow up appointment with pharmacy team member scheduled for: 4 months  Milus Height, PharmD, [place], [facility] [phone]  Chronic Pain, Adult Chronic pain is a type of pain that lasts or keeps coming back (recurs) for at least six months. You may have chronic headaches, abdominal pain, or body pain. Chronic pain may be related to an illness, such as fibromyalgia or complex regional pain syndrome. Sometimes the cause of chronic pain is not known. Chronic pain can make it hard for you to do daily activities. If not treated, chronic pain can lead to other health problems, including anxiety and depression. Treatment depends on the cause and severity of your pain. You may need to work with a pain specialist to come up with a treatment plan. The plan may include medicine, counseling, and physical therapy. Many people benefit from a combination of two or more types of treatment to control their pain. Follow these instructions at home: Lifestyle  Consider keeping a pain diary to share with your health care providers.  Consider talking with a mental health care provider (psychologist) about how to cope with chronic pain.  Consider joining a chronic pain support group.  Try to  control or lower your stress levels. Talk to your health care provider about strategies to do this. General instructions   Take over-the-counter and prescription medicines only as told by your health care provider.  Follow your treatment plan as told by your health care provider. This may  include: ? Gentle, regular exercise. ? Eating a healthy diet that includes foods such as vegetables, fruits, fish, and lean meats. ? Cognitive or behavioral therapy. ? Working with a Community education officer. ? Meditation or yoga. ? Acupuncture or massage therapy. ? Aroma, color, light, or sound therapy. ? Local electrical stimulation. ? Shots (injections) of numbing or pain-relieving medicines into the spine or the area of pain.  Check your pain level as told by your health care provider. Ask your health care provider if you should use a pain scale.  Learn as much as you can about how to manage your chronic pain. Ask your health care provider if an intensive pain rehabilitation program or a chronic pain specialist would be helpful.  Keep all follow-up visits as told by your health care provider. This is important. Contact a health care provider if:  Your pain gets worse.  You have new pain.  You have trouble sleeping.  You have trouble doing your normal activities.  Your pain is not controlled with treatment.  Your have side effects from pain medicine.  You feel weak. Get help right away if:  You lose feeling or have numbness in your body.  You lose control of bowel or bladder function.  Your pain suddenly gets much worse.  You develop shaking or chills.  You develop confusion.  You develop chest pain.  You have trouble breathing or shortness of breath.  You pass out.  You have thoughts about hurting yourself or others. This information is not intended to replace advice given to you by your health care provider. Make sure you discuss any questions you have with your health care provider. Document Revised: 07/07/2017 Document Reviewed: 01/12/2016 Elsevier Patient Education  [place].

## 2020-05-12 ENCOUNTER — Ambulatory Visit: Payer: Medicare Other | Attending: Internal Medicine

## 2020-05-12 DIAGNOSIS — Z23 Encounter for immunization: Secondary | ICD-10-CM

## 2020-05-12 NOTE — Progress Notes (Signed)
   Covid-19 Vaccination Clinic  Name:  LEONILDA COZBY    MRN: [id number] DOB: 03/11/1931  05/12/2020  Ms. Bransfield was observed post Covid-19 immunization for 15 minutes without incident. She was provided with Vaccine Information Sheet and instruction to access the V-Safe system.   Ms. Yanda was instructed to call 911 with any severe reactions post vaccine: [name] Difficulty breathing  . Swelling of face and throat  . A fast heartbeat  . A bad rash all over body  . Dizziness and weakness

## 2020-05-15 ENCOUNTER — Telehealth: Payer: Self-pay

## 2020-05-15 NOTE — Progress Notes (Signed)
Three attempts made to reach patient for monthly disease state call. Pt has appointment with PharmD next month. Will follow up as planned.

## 2020-06-05 ENCOUNTER — Ambulatory Visit (INDEPENDENT_AMBULATORY_CARE_PROVIDER_SITE_OTHER): Payer: Medicare Other | Admitting: Podiatry

## 2020-06-05 ENCOUNTER — Encounter: Payer: Self-pay | Admitting: Podiatry

## 2020-06-05 ENCOUNTER — Other Ambulatory Visit: Payer: Self-pay

## 2020-06-05 DIAGNOSIS — M76822 Posterior tibial tendinitis, left leg: Secondary | ICD-10-CM | POA: Diagnosis not present

## 2020-06-05 DIAGNOSIS — M76821 Posterior tibial tendinitis, right leg: Secondary | ICD-10-CM

## 2020-06-09 NOTE — Progress Notes (Signed)
   Subjective: 86 y.o. female presenting today for follow up evaluation of bilateral foot pain.  Patient states that the pain to her bilateral feet and ankles has recurred.  She states that the injections helped significantly with the pain.  She is very active 86 year old female and wears Ortho feet shoes  Past Medical History:  Diagnosis Date  . Anxiety   . Aortic aneurysm without rupture ([place])   . CAD (coronary artery disease)   . Chronic hip pain   . Endometriosis   . Hearing loss   . HTN, goal below 150/90   . Hyperlipidemia LDL goal <70 11/18/2014  . Osteopenia of the elderly   . Urge and stress incontinence      Objective: Physical Exam General: The patient is alert and oriented x3 in no acute distress.  Dermatology: Skin is cool, dry and supple bilateral lower extremities. Negative for open lesions or macerations.  Vascular: Palpable pedal pulses bilaterally. No edema or erythema noted. Capillary refill within normal limits.  Neurological: Epicritic and protective threshold grossly intact bilaterally.   Musculoskeletal Exam: Clinical evidence of bunion deformity noted to the respective foot. There is moderate pain on palpation range of motion of the first MPJ. Lateral deviation of the hallux noted consistent with hallux abductovalgus. Hammertoe contracture also noted on clinical exam to the 2nd digit of the left foot. Symptomatic pain on palpation and range of motion also noted to the metatarsal phalangeal joints of the respective hammertoe digits.   Pain on palpation noted to the posterior tibial tendon of the bilateral foot with medial longitudinal arch collapse noted.  Assessment: 1. HAV w/ bunion deformity left 2. Hammertoe deformity 2nd digit left 3. PTTD bilateral 4.  Posterior tibial tendinitis bilateral 5.  Plantar fasciitis left-resolved   Plan of Care:  1. Patient was evaluated.  2. Injection of 0.5 mLs [place] Soluspan injected into the posterior tibial  tendon sheath of the bilateral foot.  3.  Patient stated that her PCP recommended discontinuing meloxicam.  Okay to discontinue. 4. Continue wearing Orthofeet shoes.  5.  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     Edrick Kins, DPM [facility]  Dr. Edrick Kins, [place]                                        [place], [place] [address]                Office [phone]  Fax [phone]

## 2020-06-28 ENCOUNTER — Inpatient Hospital Stay (HOSPITAL_COMMUNITY)
Admission: EM | Admit: 2020-06-28 | Discharge: 2020-06-30 | DRG: 066 | Disposition: A | Discharge status: Home Health | Payer: Medicare Other | Attending: Internal Medicine | Admitting: Internal Medicine

## 2020-06-28 ENCOUNTER — Encounter (HOSPITAL_COMMUNITY): Payer: Self-pay | Admitting: Emergency Medicine

## 2020-06-28 ENCOUNTER — Emergency Department (HOSPITAL_COMMUNITY): Payer: Medicare Other

## 2020-06-28 ENCOUNTER — Other Ambulatory Visit: Payer: Self-pay

## 2020-06-28 ENCOUNTER — Observation Stay (HOSPITAL_COMMUNITY): Payer: Medicare Other

## 2020-06-28 DIAGNOSIS — R29700 NIHSS score 0: Secondary | ICD-10-CM | POA: Diagnosis present

## 2020-06-28 DIAGNOSIS — I6521 Occlusion and stenosis of right carotid artery: Secondary | ICD-10-CM | POA: Diagnosis present

## 2020-06-28 DIAGNOSIS — Z20822 Contact with and (suspected) exposure to covid-19: Secondary | ICD-10-CM | POA: Diagnosis present

## 2020-06-28 DIAGNOSIS — I358 Other nonrheumatic aortic valve disorders: Secondary | ICD-10-CM | POA: Diagnosis present

## 2020-06-28 DIAGNOSIS — R8271 Bacteriuria: Secondary | ICD-10-CM | POA: Diagnosis present

## 2020-06-28 DIAGNOSIS — I6381 Other cerebral infarction due to occlusion or stenosis of small artery: Secondary | ICD-10-CM | POA: Diagnosis not present

## 2020-06-28 DIAGNOSIS — Z8249 Family history of ischemic heart disease and other diseases of the circulatory system: Secondary | ICD-10-CM

## 2020-06-28 DIAGNOSIS — M1990 Unspecified osteoarthritis, unspecified site: Secondary | ICD-10-CM | POA: Diagnosis present

## 2020-06-28 DIAGNOSIS — Z79899 Other long term (current) drug therapy: Secondary | ICD-10-CM

## 2020-06-28 DIAGNOSIS — R0789 Other chest pain: Secondary | ICD-10-CM | POA: Diagnosis present

## 2020-06-28 DIAGNOSIS — Z803 Family history of malignant neoplasm of breast: Secondary | ICD-10-CM

## 2020-06-28 DIAGNOSIS — I639 Cerebral infarction, unspecified: Secondary | ICD-10-CM | POA: Diagnosis not present

## 2020-06-28 DIAGNOSIS — R079 Chest pain, unspecified: Secondary | ICD-10-CM | POA: Diagnosis present

## 2020-06-28 DIAGNOSIS — I10 Essential (primary) hypertension: Secondary | ICD-10-CM | POA: Diagnosis present

## 2020-06-28 DIAGNOSIS — R260 Ataxic gait: Secondary | ICD-10-CM | POA: Diagnosis present

## 2020-06-28 DIAGNOSIS — I2510 Atherosclerotic heart disease of native coronary artery without angina pectoris: Secondary | ICD-10-CM | POA: Diagnosis present

## 2020-06-28 DIAGNOSIS — M8580 Other specified disorders of bone density and structure, unspecified site: Secondary | ICD-10-CM | POA: Diagnosis present

## 2020-06-28 DIAGNOSIS — E785 Hyperlipidemia, unspecified: Secondary | ICD-10-CM | POA: Diagnosis present

## 2020-06-28 DIAGNOSIS — Z823 Family history of stroke: Secondary | ICD-10-CM

## 2020-06-28 HISTORY — DX: Cerebral infarction, unspecified: I63.9

## 2020-06-28 LAB — I-STAT CHEM 8, ED
BUN: 30 mg/dL — ABNORMAL HIGH (ref 8–23)
Calcium, Ion: 1.18 mmol/L (ref 1.15–1.40)
Chloride: 107 mmol/L (ref 98–111)
Creatinine, Ser: 0.6 mg/dL (ref 0.44–1.00)
Glucose, Bld: 93 mg/dL (ref 70–99)
HCT: 40 % (ref 36.0–46.0)
Hemoglobin: 13.6 g/dL (ref 12.0–15.0)
Potassium: 3.8 mmol/L (ref 3.5–5.1)
Sodium: 144 mmol/L (ref 135–145)
TCO2: 25 mmol/L (ref 22–32)

## 2020-06-28 LAB — CBC WITH DIFFERENTIAL/PLATELET
Abs Immature Granulocytes: 0.02 10*3/uL (ref 0.00–0.07)
Basophils Absolute: 0 10*3/uL (ref 0.0–0.1)
Basophils Relative: 0 %
Eosinophils Absolute: 0.1 10*3/uL (ref 0.0–0.5)
Eosinophils Relative: 2 %
HCT: 42.7 % (ref 36.0–46.0)
Hemoglobin: 13.9 g/dL (ref 12.0–15.0)
Immature Granulocytes: 0 %
Lymphocytes Relative: 15 %
Lymphs Abs: 1.2 10*3/uL (ref 0.7–4.0)
MCH: 32.3 pg (ref 26.0–34.0)
MCHC: 32.6 g/dL (ref 30.0–36.0)
MCV: 99.3 fL (ref 80.0–100.0)
Monocytes Absolute: 0.8 10*3/uL (ref 0.1–1.0)
Monocytes Relative: 9 %
Neutro Abs: 5.9 10*3/uL (ref 1.7–7.7)
Neutrophils Relative %: 74 %
Platelets: 270 10*3/uL (ref 150–400)
RBC: 4.3 MIL/uL (ref 3.87–5.11)
RDW: 12.8 % (ref 11.5–15.5)
WBC: 8.1 10*3/uL (ref 4.0–10.5)
nRBC: 0 % (ref 0.0–0.2)

## 2020-06-28 LAB — APTT: aPTT: 27 seconds (ref 24–36)

## 2020-06-28 LAB — COMPREHENSIVE METABOLIC PANEL
ALT: 28 U/L (ref 0–44)
AST: 26 U/L (ref 15–41)
Albumin: 4 g/dL (ref 3.5–5.0)
Alkaline Phosphatase: 48 U/L (ref 38–126)
Anion gap: 10 (ref 5–15)
BUN: 29 mg/dL — ABNORMAL HIGH (ref 8–23)
CO2: 25 mmol/L (ref 22–32)
Calcium: 9.4 mg/dL (ref 8.9–10.3)
Chloride: 108 mmol/L (ref 98–111)
Creatinine, Ser: 0.71 mg/dL (ref 0.44–1.00)
GFR, Estimated: >60 mL/min (ref >60)
Glucose, Bld: 107 mg/dL — ABNORMAL HIGH (ref 70–99)
Potassium: 4.1 mmol/L (ref 3.5–5.1)
Sodium: 143 mmol/L (ref 135–145)
Total Bilirubin: 0.7 mg/dL (ref 0.3–1.2)
Total Protein: 7.5 g/dL (ref 6.5–8.1)

## 2020-06-28 LAB — URINALYSIS, ROUTINE W REFLEX MICROSCOPIC
Bilirubin Urine: NEGATIVE
Glucose, UA: NEGATIVE mg/dL
Hgb urine dipstick: NEGATIVE
Ketones, ur: NEGATIVE mg/dL
Nitrite: POSITIVE — AB
Protein, ur: NEGATIVE mg/dL
Specific Gravity, Urine: 1.011 (ref 1.005–1.030)
WBC, UA: >50 WBC/hpf — ABNORMAL HIGH (ref 0–5)
pH: 5 (ref 5.0–8.0)

## 2020-06-28 LAB — PROTIME-INR
INR: 1 (ref 0.8–1.2)
Prothrombin Time: 12.9 seconds (ref 11.4–15.2)

## 2020-06-28 LAB — RESP PANEL BY RT-PCR (FLU A&B, COVID) ARPGX2
Influenza A by PCR: NEGATIVE
Influenza B by PCR: NEGATIVE
SARS Coronavirus 2 by RT PCR: NEGATIVE

## 2020-06-28 LAB — RAPID URINE DRUG SCREEN, HOSP PERFORMED
Amphetamines: NOT DETECTED
Barbiturates: NOT DETECTED
Benzodiazepines: NOT DETECTED
Cocaine: NOT DETECTED
Opiates: NOT DETECTED
Tetrahydrocannabinol: NOT DETECTED

## 2020-06-28 LAB — CBG MONITORING, ED: Glucose-Capillary: 98 mg/dL (ref 70–99)

## 2020-06-28 LAB — TROPONIN I (HIGH SENSITIVITY)
Troponin I (High Sensitivity): 5 ng/L (ref <18)
Troponin I (High Sensitivity): 6 ng/L (ref <18)

## 2020-06-28 LAB — ETHANOL: Alcohol, Ethyl (B): <10 mg/dL (ref <10)

## 2020-06-28 MED ORDER — ASPIRIN 81 MG PO CHEW: 324.0000 mg | CHEWABLE_TABLET | Freq: Once | ORAL | Status: DC

## 2020-06-28 MED ORDER — ACETAMINOPHEN 325 MG PO TABS: 650.0000 mg | ORAL_TABLET | ORAL | Status: DC | PRN

## 2020-06-28 MED ORDER — NITROGLYCERIN 0.4 MG SL SUBL
0.4000 mg | SUBLINGUAL_TABLET | SUBLINGUAL | Status: DC | PRN
  Administered 2020-06-28: 0.4 mg via SUBLINGUAL
  Filled 2020-06-28: qty 1

## 2020-06-28 MED ORDER — SENNOSIDES-DOCUSATE SODIUM 8.6-50 MG PO TABS: 1.0000 | ORAL_TABLET | Freq: Every evening | ORAL | Status: DC | PRN

## 2020-06-28 MED ORDER — LABETALOL HCL 5 MG/ML IV SOLN: 10.0000 mg | INTRAVENOUS | Status: DC | PRN

## 2020-06-28 MED ORDER — STROKE: EARLY STAGES OF RECOVERY BOOK
Freq: Once | Status: AC
  Administered 2020-06-28: 1
  Filled 2020-06-28: qty 1

## 2020-06-28 MED ORDER — ASPIRIN 81 MG PO CHEW
324.0000 mg | CHEWABLE_TABLET | Freq: Once | ORAL | Status: AC
  Administered 2020-06-28: 324 mg via ORAL
  Filled 2020-06-28: qty 4

## 2020-06-28 MED ORDER — ASPIRIN 300 MG RE SUPP: 300.0000 mg | Freq: Every day | RECTAL | Status: DC

## 2020-06-28 MED ORDER — ASPIRIN 325 MG PO TABS
325.0000 mg | ORAL_TABLET | Freq: Every day | ORAL | Status: DC
  Administered 2020-06-29 – 2020-06-30 (×2): 325 mg via ORAL
  Filled 2020-06-28 (×2): qty 1

## 2020-06-28 MED ORDER — LORAZEPAM 1 MG PO TABS
1.0000 mg | ORAL_TABLET | Freq: Once | ORAL | Status: AC
  Administered 2020-06-28: 1 mg via ORAL
  Filled 2020-06-28: qty 1

## 2020-06-28 MED ORDER — NITROGLYCERIN 0.4 MG SL SUBL: 0.4000 mg | SUBLINGUAL_TABLET | SUBLINGUAL | Status: DC | PRN

## 2020-06-28 MED ORDER — SODIUM CHLORIDE 0.9 % IV BOLUS
1000.0000 mL | Freq: Once | INTRAVENOUS | Status: AC
  Administered 2020-06-28: 1000 mL via INTRAVENOUS

## 2020-06-28 MED ORDER — ENOXAPARIN SODIUM 40 MG/0.4ML ~~LOC~~ SOLN
40.0000 mg | SUBCUTANEOUS | Status: DC
  Administered 2020-06-28: 40 mg via SUBCUTANEOUS
  Filled 2020-06-28 (×2): qty 0.4

## 2020-06-28 MED ORDER — ACETAMINOPHEN 160 MG/5ML PO SOLN: 650.0000 mg | ORAL | Status: DC | PRN

## 2020-06-28 MED ORDER — ACETAMINOPHEN 650 MG RE SUPP: 650.0000 mg | RECTAL | Status: DC | PRN

## 2020-06-28 MED ORDER — ATORVASTATIN CALCIUM 40 MG PO TABS
40.0000 mg | ORAL_TABLET | Freq: Every day | ORAL | Status: DC
  Administered 2020-06-28 – 2020-06-29 (×2): 40 mg via ORAL
  Filled 2020-06-28 (×2): qty 1

## 2020-06-28 MED ORDER — SODIUM CHLORIDE (PF) 0.9 % IJ SOLN
INTRAMUSCULAR | Status: AC
  Filled 2020-06-28: qty 50

## 2020-06-28 MED ORDER — IOHEXOL 350 MG/ML SOLN
100.0000 mL | Freq: Once | INTRAVENOUS | Status: AC | PRN
  Administered 2020-06-28: 100 mL via INTRAVENOUS

## 2020-06-28 NOTE — ED Triage Notes (Signed)
Pt coming from home with complaints of chest pressure and dizziness that began this morning. Pt states she did experience a fall because of the dizziness. Hx MI.

## 2020-06-28 NOTE — H&P (Signed)
History and Physical    Angel [name] CVE:[id number] DOB: 1931-01-30 DOA: 06/28/2020  PCP: Delsa Grana, PA-C  Patient coming from: Home  I have personally briefly reviewed patient's old medical records in [place]  Chief Complaint: Dizziness, imbalance  HPI: Angel [name] is a 86 y.o. female with medical history significant for hypertension, hyperlipidemia, presumed CAD, and osteoarthritis who presents to the ED for evaluation of dizziness and unsteady gait.  Patient states she was in her usual state of health whenever she went to bed night of 11/20.  She woke up at 7 AM today (06/28/2020) and noticed new onset of dizziness and gait imbalance.  Dizziness is described as a room spinning sensation and occurred when she attempted to walk.  She says the dizziness would then resolve after resting.  She felt as if her balance was off and that her left leg movement was uncoordinated.  She says she did fall twice onto the carpet at home due to her dizziness/imbalance.  She denied any significant injury or hitting her head.  She did not lose consciousness.  She did not have any weakness in her upper or lower extremities.  She did not notice any new change in sensation.  She also noticed mid central sternal chest pressure that occurred alongside her dizziness.  Chest pressure did not radiate to her arms, neck, jaw, or back.  She did not have associated diaphoresis, nausea, vomiting, dyspnea, dysuria, or abdominal pain.  She says the chest pressure has completely resolved after receiving sublingual nitroglycerin and oral Ativan.  She says she does have similar symptoms on occasion which she says can be caused by emotional stress which she has been experiencing recently.  ED Course:  Initial vitals showed BP 175/85, pulse 94, RR 12, temp 98.2 Fahrenheit, SPO2 97% on room air.  Labs show sodium 143, potassium 4.1, bicarb 25, BUN 29, creatinine 0.71, serum glucose 107, LFTs within normal limits,  WBC 8.1, hemoglobin 13.9, platelets 270,000, high-sensitivity troponin I 5 > 6.  Serum ethanol <10.  Urinalysis shows positive nitrites, moderate leukocytes, 0-5 RBC/hpf, >50 WBC/hpf, many bacteria microscopy.  UDS is negative.  Portable chest x-ray is negative for focal consolidation, edema, or effusion.  CT head without contrast shows atrophy, chronic microvascular disease without acute intracranial abnormality.  MRI brain without contrast shows questionable punctate acute infarct of the dorsal right parasagittal pons along the floor of the fourth ventricle just below the level of the trigeminal nerves.  Patient was given 1 L normal saline and oral Ativan.  EDP discussed with on-call neurology who recommended admission to [facility] for further stroke work-up.  EDP also discussed with on-call cardiology who did not think any acute coronary work-up needed at this time.  Patient was given aspirin 324 mg and the hospitalist service was consulted to admit for further evaluation and management.  CTA head and neck were ordered and pending.  Review of Systems: All systems reviewed and are negative except as documented in history of present illness above.   Past Medical History:  Diagnosis Date  . Anxiety   . Aortic aneurysm without rupture ([place])   . CAD (coronary artery disease)   . Chronic hip pain   . Endometriosis   . Hearing loss   . HTN, goal below 150/90   . Hyperlipidemia LDL goal <70 11/18/2014  . Osteopenia of the elderly   . Urge and stress incontinence     Past Surgical History:  Procedure Laterality Date  .  ABDOMINAL HYSTERECTOMY  1976   total  . APPENDECTOMY    . BREAST EXCISIONAL BIOPSY Left 1987   benign  . CATARACT EXTRACTION, BILATERAL  08/2018   Dr. Manuella Ghazi with [facility] History:  reports that she has never smoked. She has never used smokeless tobacco. She reports current alcohol use of about 1.0 standard drink of alcohol per week. She reports  that she does not use drugs.  Allergies  Allergen Reactions  . Codeine Other (See Comments)    "zoned out"  . Levofloxacin Other (See Comments)    AMS  . Myrbetriq [Mirabegron] Other (See Comments)    Severe depression  . Prednisone Other (See Comments)    AMS  . Sulfa Antibiotics Other (See Comments)    AMS    Family History  Problem Relation Age of Onset  . CVA Mother   . Stroke Mother   . Breast cancer Maternal Grandmother 60  . Heart disease Maternal Grandmother   . Cancer Brother        skin     Prior to Admission medications   Medication Sig Start Date End Date Taking? Authorizing Provider  amLODipine (NORVASC) 5 MG tablet TAKE 1 TABLET BY MOUTH DAILY Patient taking differently: Take 5 mg by mouth daily.  03/11/20  Yes Belva Crome, MD  Ascorbic Acid (VITAMIN C) 100 MG tablet Take 500 mg by mouth daily. Gummy 750mg    Yes [provider]  atorvastatin (LIPITOR) 40 MG tablet TAKE ONE TABLET BY MOUTH EVERY NIGHT AT BEDTIME Patient taking differently: Take 40 mg by mouth every evening.  03/11/20  Yes Belva Crome, MD  glucosamine-chondroitin 500-400 MG tablet Take 1 tablet by mouth daily.   Yes [provider]  ibuprofen (ADVIL) 200 MG tablet Take 400 mg by mouth at bedtime.   Yes [provider]  lisinopril (ZESTRIL) 40 MG tablet TAKE 1 TABLET BY MOUTH EVERY DAY Patient taking differently: Take 40 mg by mouth daily.  03/24/20  Yes Belva Crome, MD  metoprolol succinate (TOPROL-XL) 50 MG 24 hr tablet TAKE 1 & 1/2 TABLET BY MOUTH DAILY TAKE WITH OR IMMEDIATELY FOLLOWING A MEAL Patient taking differently: Take 75 mg by mouth daily.  03/24/20  Yes Belva Crome, MD  Multiple Vitamin (MULTIVITAMIN) capsule Take 1 capsule by mouth daily.   Yes [provider]  Omega-3 Fatty Acids (FISH OIL) 1000 MG CAPS Take by mouth. Krill oil   Yes [provider]  vitamin B-12 (CYANOCOBALAMIN) 100 MCG tablet Take 1,000 mcg by mouth daily.    Yes  [provider]  VITAMIN D, ERGOCALCIFEROL, PO Take 1,000 Units by mouth daily.   Yes [provider]  meloxicam (MOBIC) 15 MG tablet TAKE 1 TABLET BY MOUTH EVERY DAY Patient not taking: Reported on 06/28/2020 05/07/20   Edrick Kins, DPM  nitroGLYCERIN (NITROSTAT) 0.4 MG SL tablet Place 1 tablet (0.4 mg total) under the tongue every 5 (five) minutes as needed for chest pain. 11/20/19 02/18/20  Richardson Dopp T, PA-C    Physical Exam: Vitals:   06/28/20 1550 06/28/20 1740 06/28/20 1800 06/28/20 1830  BP: (!) 175/85 (!) 140/91 131/83 134/78  Pulse: 94 91 79 86  Resp: 12 17 16  (!) 24  Temp: 98.2 F (36.8 C)     TempSrc: Oral     SpO2: 97% 94% 96% 90%   Constitutional: Elderly woman resting in bed, NAD, calm, comfortable Eyes: PERRL, EOMI, slight  horizontal nystagmus with rightward gaze, lids and conjunctivae normal ENMT: Mucous membranes are moist. Posterior pharynx clear of any exudate or lesions.Normal dentition.  Neck: normal, supple, no masses. Respiratory: Bibasilar inspiratory crackles. Normal respiratory effort. No accessory muscle use.  Cardiovascular: Regular rate and rhythm, 2/6 systolic murmur present. No extremity edema. 2+ pedal pulses. Abdomen: no tenderness, no masses palpated. No hepatosplenomegaly. Bowel sounds positive.  Musculoskeletal: no clubbing / cyanosis. No joint deformity upper and lower extremities. Good ROM, no contractures. Normal muscle tone.  Skin: no rashes, lesions, ulcers. No induration Neurologic: CN 2-12 grossly intact. Sensation intact, Strength 5/5 in all 4.  FTN and heel-to-shin intact. Psychiatric: Normal judgment and insight. Alert and oriented x 3. Normal mood.   Labs on Admission: I have personally reviewed following labs and imaging studies  CBC: Recent Labs  Lab 06/28/20 1635 06/28/20 1647  WBC 8.1  --   NEUTROABS 5.9  --   HGB 13.9 13.6  HCT 42.7 40.0  MCV 99.3  --   PLT 270  --    Basic Metabolic Panel: Recent  Labs  Lab 06/28/20 1613 06/28/20 1647  NA 143 144  K 4.1 3.8  CL 108 107  CO2 25  --   GLUCOSE 107* 93  BUN 29* 30*  CREATININE 0.71 0.60  CALCIUM 9.4  --    GFR: CrCl cannot be calculated (Unknown ideal weight.). Liver Function Tests: Recent Labs  Lab 06/28/20 1613  AST 26  ALT 28  ALKPHOS 48  BILITOT 0.7  PROT 7.5  ALBUMIN 4.0   No results for input(s): LIPASE, AMYLASE in the last 168 hours. No results for input(s): AMMONIA in the last 168 hours. Coagulation Profile: Recent Labs  Lab 06/28/20 1635  INR 1.0   Cardiac Enzymes: No results for input(s): CKTOTAL, CKMB, CKMBINDEX, TROPONINI in the last 168 hours. BNP (last 3 results) No results for input(s): PROBNP in the last 8760 hours. HbA1C: No results for input(s): HGBA1C in the last 72 hours. CBG: Recent Labs  Lab 06/28/20 1618  GLUCAP 98   Lipid Profile: No results for input(s): CHOL, HDL, LDLCALC, TRIG, CHOLHDL, LDLDIRECT in the last 72 hours. Thyroid Function Tests: No results for input(s): TSH, T4TOTAL, FREET4, T3FREE, THYROIDAB in the last 72 hours. Anemia Panel: No results for input(s): VITAMINB12, FOLATE, FERRITIN, TIBC, IRON, RETICCTPCT in the last 72 hours. Urine analysis:    Component Value Date/Time   COLORURINE YELLOW 06/28/2020 1620   APPEARANCEUR CLEAR 06/28/2020 1620   APPEARANCEUR Clear 06/07/2018 1455   LABSPEC 1.011 06/28/2020 1620   PHURINE 5.0 06/28/2020 1620   GLUCOSEU NEGATIVE 06/28/2020 1620   HGBUR NEGATIVE 06/28/2020 1620   BILIRUBINUR NEGATIVE 06/28/2020 1620   BILIRUBINUR Negative 06/07/2018 1455   KETONESUR NEGATIVE 06/28/2020 1620   PROTEINUR NEGATIVE 06/28/2020 1620   NITRITE POSITIVE (A) 06/28/2020 1620   LEUKOCYTESUR MODERATE (A) 06/28/2020 1620    Radiological Exams on Admission: CT HEAD WO CONTRAST  Result Date: 06/28/2020 CLINICAL DATA:  Dizziness EXAM: CT HEAD WITHOUT CONTRAST TECHNIQUE: Contiguous axial images were obtained from the base of the skull  through the vertex without intravenous contrast. COMPARISON:  None. FINDINGS: Brain: There is atrophy and chronic small vessel disease changes. No acute intracranial abnormality. Specifically, no hemorrhage, hydrocephalus, mass lesion, acute infarction, or significant intracranial injury. Vascular: No hyperdense vessel or unexpected calcification. Skull: No acute calvarial abnormality. Sinuses/Orbits: Visualized paranasal sinuses and mastoids clear. Orbital soft tissues unremarkable. Other: None IMPRESSION: Atrophy, chronic microvascular disease. No acute intracranial  abnormality. Electronically Signed   By: Rolm Baptise M.D.   On: 06/28/2020 16:52   MR BRAIN WO CONTRAST  Result Date: 06/28/2020 CLINICAL DATA:  Dizziness EXAM: MRI HEAD WITHOUT CONTRAST TECHNIQUE: Multiplanar, multiecho pulse sequences of the brain and surrounding structures were obtained without intravenous contrast. COMPARISON:  None. FINDINGS: Brain: Questionable small focus of mildly reduced diffusion along the right parasagittal dorsal pons/floor of the fourth ventricle just below the level of the trigeminal nerves (series 5, image 63). Punctate focus of susceptibility hypointensity in the central right pons is compatible with chronic microhemorrhage or mineralization. There is no intracranial mass or mass effect. There is no hydrocephalus or extra-axial fluid collection. Prominence of the ventricles and sulci reflects generalized parenchymal volume loss. Patchy and confluent areas of T2 hyperintensity in the supratentorial greater than pontine white matter are nonspecific but may reflect moderate chronic microvascular ischemic changes. Vascular: Major vessel flow voids at the skull base are preserved. Skull and upper cervical spine: Normal marrow signal is preserved. Sinuses/Orbits: Paranasal sinuses are aerated. Bilateral lens replacements. Other: Sella is unremarkable.  Mastoid air cells are clear. IMPRESSION: Questionable punctate acute  infarct of the dorsal right parasagittal pons along the floor of the fourth ventricle just below the level of the trigeminal nerves. This could be artifactual. Moderate chronic microvascular ischemic changes. Electronically Signed   By: Macy Mis M.D.   On: 06/28/2020 19:40   DG Chest Portable 1 View  Result Date: 06/28/2020 CLINICAL DATA:  Chest pain. EXAM: PORTABLE CHEST 1 VIEW COMPARISON:  May 04, 2006 FINDINGS: No pneumothorax. The cardiomediastinal silhouette is stable. The lungs are clear. IMPRESSION: No active disease. Electronically Signed   By: Dorise Bullion III M.D   On: 06/28/2020 16:50    EKG: Personally reviewed. Sinus rhythm without acute ischemic changes, PVC present.  When compared to previous, PVC is new.  Assessment/Plan Principal Problem:   Acute CVA (cerebrovascular accident) ([place]) Active Problems:   Hypertension goal BP (blood pressure) < 140/90   Hyperlipidemia LDL goal <70   Asymptomatic bacteriuria   Chest pain  Angel [name] is a 86 y.o. female with medical history significant for hypertension, hyperlipidemia, presumed CAD, and osteoarthritis who is admitted for acute CVA.  Acute CVA: History and MRI brain findings concerning for acute posterior ischemic stroke.  Neurology recommended admission to [facility] for further stroke work-up. -Ad[facility] to [facility] for [facility]y consultation -Follow CTA head and neck -Echocardiogram -Continue aspirin 325 mg daily -Continue atorvastatin 40 mg daily -Monitor on telemetry, continue neurochecks -PT/OT/SLP eval -Check A1c and lipid panel -Allow permissive hypertension up to 220/110 for now  Chest pain: Patient with history of presumed CAD for which she will follows with cardiology, Dr. Daneen Schick.  Had prior cardiac MRI which showed small apical infarct versus diverticulum.  Per prior documentation, patient declined further cardiac work-up with stress testing or cardiac cath and has been  medically managed.  Patient states she is chest pain-free now. -Troponins negative x2 -EKG without acute ischemic changes -Continue aspirin and statin as above -Home Toprol-XL on hold for now to allow permissive hypertension  Asymptomatic bacteriuria: Patient denies any urinary symptoms.  No indication for antibiotics at this time.  Hypertension: On amlodipine, lisinopril, Toprol-XL on hold to allow for permissive hypertension as above.  Use IV labetalol as needed for BP >220/110.  Hyperlipidemia: Continue atorvastatin.  DVT prophylaxis: Lovenox Code Status: Full code, confirmed with patient Family Communication: Discussed with patient's significant other at  bedside Disposition Plan: From home and likely discharge to home pending further stroke work-up and PT/OT/SLP eval Consults called: Neurology Admission status:  Status is: Observation  The patient remains OBS appropriate and will d/c before 2 midnights.  Dispo: The patient is from: Home              Anticipated d/c is to: Home              Anticipated d/c date is: 1 day              Patient currently is not medically stable to d/c.   Zada Finders MD [facility]  If 7PM-7AM, please contact night-coverage [email or web address]  06/28/2020, 8:41 PM

## 2020-06-28 NOTE — ED Provider Notes (Signed)
[facility] DEPT Provider Note   CSN: [id number] Arrival date & time: 06/28/20  1539     History Chief Complaint  Patient presents with  . Chest Pain  . Dizziness    Angel [name] is a 86 y.o. female.  HPI  HPI: A 86 year old patient with a history of hypertension, hypercholesterolemia and obesity presents for evaluation of chest pain. Initial onset of pain was less than one hour ago. The patient's chest pain is described as heaviness/pressure/tightness and is not worse with exertion. The patient's chest pain is not middle- or left-sided, is not well-localized, is not sharp and does not radiate to the arms/jaw/neck. The patient does not complain of nausea and denies diaphoresis. The patient has no history of stroke, has no history of peripheral artery disease, has not smoked in the past 90 days, denies any history of treated diabetes and has no relevant family history of coronary artery disease (first degree relative at less than age 46).   Patient's chest pain started earlier today.  Currently she is having mild discomfort.  The discomfort is described as pressure/heaviness.  She has been under a lot of stress recently but denies any recurrence of chest pain over the past few days.  No history of CAD that has required intervention.  Her records indicate that she has history of left ear and aortic aneurysm.  Chest pain is nonradiating.  No associated nausea, shortness of breath, diaphoresis.  The main reason patient is in the ER today is because of dizziness and imbalance.  She reports that she woke up early in the morning and got out of the bed around 7 AM.  At that time she noticed that she had a little bit of imbalance.  She had an episode of dizziness, described a spinning sensation that made her fall.  She had 2 falls today.  Thereafter she was able to manage to walk and carry on with ADLs with extreme caution.  She still feels slightly unsteady with  walking, favoring more to the left side.  Review of system is negative for any associated numbness, tingling, new vision change, new headaches, neck pain.   Past Medical History:  Diagnosis Date  . Anxiety   . Aortic aneurysm without rupture ([place])   . CAD (coronary artery disease)   . Chronic hip pain   . Endometriosis   . Hearing loss   . HTN, goal below 150/90   . Hyperlipidemia LDL goal <70 11/18/2014  . Osteopenia of the elderly   . Urge and stress incontinence     Patient Active Problem List   Diagnosis Date Noted  . Acute CVA (cerebrovascular accident) (Angel [name]) 06/28/2020  . Asymptomatic bacteriuria 06/28/2020  . Chest pain 06/28/2020  . Arthritis of knee, degenerative 05/22/2018  . Cataract, right eye 05/17/2018  . Aortic stenosis, mild 02/22/2018  . Obesity (BMI 30.0-34.9) 01/18/2018  . Hip pain, chronic 04/02/2015  . Hearing loss of both ears 04/02/2015  . Osteopenia of the elderly 04/02/2015  . Hormone replacement therapy (postmenopausal) 04/02/2015  . Aortic aneurysm without rupture (Angel [name]) 04/02/2015  . Situational anxiety 04/02/2015  . Hyperlipidemia LDL goal <70 11/18/2014  . Mixed stress and urge urinary incontinence 06/29/2014  . Hypertension goal BP (blood pressure) < 140/90 11/06/2013  . CAD (coronary artery disease) 11/06/2013    Past Surgical History:  Procedure Laterality Date  . ABDOMINAL HYSTERECTOMY  1976   total  . APPENDECTOMY    . BREAST EXCISIONAL BIOPSY Left  1987   benign  . CATARACT EXTRACTION, BILATERAL  08/2018   Dr. Manuella Ghazi with [facility]     OB History   No obstetric history on file.     Family History  Problem Relation Age of Onset  . CVA Mother   . Stroke Mother   . Breast cancer Maternal Grandmother 60  . Heart disease Maternal Grandmother   . Cancer Brother        skin    Social History   Tobacco Use  . Smoking status: Never Smoker  . Smokeless tobacco: Never Used  Vaping Use  . Vaping Use: Never used    Substance Use Topics  . Alcohol use: Yes    Alcohol/week: 1.0 standard drink    Types: 1 Cans of beer per week    Comment: rare  . Drug use: No    Home Medications Prior to Admission medications   Medication Sig Start Date End Date Taking? Authorizing Provider  amLODipine (NORVASC) 5 MG tablet TAKE 1 TABLET BY MOUTH DAILY Patient taking differently: Take 5 mg by mouth daily.  03/11/20  Yes Belva Crome, MD  Ascorbic Acid (VITAMIN C) 100 MG tablet Take 500 mg by mouth daily. Gummy 750mg    Yes [provider]  atorvastatin (LIPITOR) 40 MG tablet TAKE ONE TABLET BY MOUTH EVERY NIGHT AT BEDTIME Patient taking differently: Take 40 mg by mouth every evening.  03/11/20  Yes Belva Crome, MD  glucosamine-chondroitin 500-400 MG tablet Take 1 tablet by mouth daily.   Yes [provider]  ibuprofen (ADVIL) 200 MG tablet Take 400 mg by mouth at bedtime.   Yes [provider]  lisinopril (ZESTRIL) 40 MG tablet TAKE 1 TABLET BY MOUTH EVERY DAY Patient taking differently: Take 40 mg by mouth daily.  03/24/20  Yes Belva Crome, MD  metoprolol succinate (TOPROL-XL) 50 MG 24 hr tablet TAKE 1 & 1/2 TABLET BY MOUTH DAILY TAKE WITH OR IMMEDIATELY FOLLOWING A MEAL Patient taking differently: Take 75 mg by mouth daily.  03/24/20  Yes Belva Crome, MD  Multiple Vitamin (MULTIVITAMIN) capsule Take 1 capsule by mouth daily.   Yes [provider]  Omega-3 Fatty Acids (FISH OIL) 1000 MG CAPS Take by mouth. Krill oil   Yes [provider]  vitamin B-12 (CYANOCOBALAMIN) 100 MCG tablet Take 1,000 mcg by mouth daily.    Yes [provider]  VITAMIN D, ERGOCALCIFEROL, PO Take 1,000 Units by mouth daily.   Yes [provider]  nitroGLYCERIN (NITROSTAT) 0.4 MG SL tablet Place 1 tablet (0.4 mg total) under the tongue every 5 (five) minutes as needed for chest pain. 11/20/19 02/18/20  Richardson Dopp T, PA-C    Allergies    Codeine, Levofloxacin, Myrbetriq  [mirabegron], Prednisone, and Sulfa antibiotics  Review of Systems   Review of Systems  Constitutional: Positive for activity change.  Respiratory: Positive for chest tightness. Negative for shortness of breath.   Cardiovascular: Positive for chest pain.  Gastrointestinal: Positive for nausea.  Neurological: Positive for dizziness and headaches. Negative for seizures, weakness and numbness.  All other systems reviewed and are negative.   Physical Exam Updated Vital Signs BP (!) 157/103   Pulse 81   Temp 98.2 F (36.8 C) (Oral)   Resp 12   LMP  (LMP Unknown)   SpO2 90%   Physical Exam Constitutional:      Appearance: She is well-developed.  HENT:     Head: Normocephalic and atraumatic.  Eyes:     Pupils: Pupils are equal, round, and reactive to light.  Cardiovascular:     Rate and Rhythm: Normal rate and regular rhythm.     Heart sounds: Normal heart sounds. No murmur heard.   Pulmonary:     Effort: Pulmonary effort is normal. No respiratory distress.  Abdominal:     General: There is no distension.     Palpations: Abdomen is soft.     Tenderness: There is no abdominal tenderness. There is no guarding or rebound.  Musculoskeletal:     Cervical back: Neck supple.  Skin:    General: Skin is warm and dry.  Neurological:     Mental Status: She is alert and oriented to person, place, and time.     Cranial Nerves: No cranial nerve deficit.     Motor: No weakness.     Comments: Subtle right-sided nasolabial flatness -family reports it is unlikely to be acute.  Patient has saccadic eye movement with right-sided gaze and upon ambulation she had mild ataxia with her favoring left side.  Cerebellar exam did not reveal any dysmetria.     ED Results / Procedures / Treatments   Labs (all labs ordered are listed, but only abnormal results are displayed) Labs Reviewed  COMPREHENSIVE METABOLIC PANEL - Abnormal; Notable for the following components:      Result Value    Glucose, Bld 107 (*)    BUN 29 (*)    All other components within normal limits  URINALYSIS, ROUTINE W REFLEX MICROSCOPIC - Abnormal; Notable for the following components:   Nitrite POSITIVE (*)    Leukocytes,Ua MODERATE (*)    WBC, UA >50 (*)    Bacteria, UA MANY (*)    All other components within normal limits  I-STAT CHEM 8, ED - Abnormal; Notable for the following components:   BUN 30 (*)    All other components within normal limits  RESP PANEL BY RT-PCR (FLU A&B, COVID) ARPGX2  APTT  PROTIME-INR  CBC WITH DIFFERENTIAL/PLATELET  ETHANOL  RAPID URINE DRUG SCREEN, HOSP PERFORMED  HEMOGLOBIN A1C  LIPID PANEL  CBC  BASIC METABOLIC PANEL  CBG MONITORING, ED  TROPONIN I (HIGH SENSITIVITY)  TROPONIN I (HIGH SENSITIVITY)    EKG EKG Interpretation  Date/Time:  Sunday June 28 2020 16:13:18 EST Ventricular Rate:  86 PR Interval:    QRS Duration: 90 QT Interval:  396 QTC Calculation: 474 R Axis:   25 Text Interpretation: Sinus rhythm Ventricular premature complex No acute changes No significant change since last tracing Confirmed by Varney Biles [phone]) on 06/28/2020 4:31:30 PM   Radiology CT Angio Head W or Wo Contrast  Result Date: 06/28/2020 CLINICAL DATA:  Neuro deficit, acute. Stroke suspected. EXAM: CT ANGIOGRAPHY HEAD AND NECK TECHNIQUE: Multidetector CT imaging of the head and neck was performed using the standard protocol during bolus administration of intravenous contrast. Multiplanar CT image reconstructions and MIPs were obtained to evaluate the vascular anatomy. Carotid stenosis measurements (when applicable) are obtained utilizing NASCET criteria, using the distal internal carotid diameter as the denominator. CONTRAST:  173mL OMNIPAQUE IOHEXOL 350 MG/ML SOLN COMPARISON:  None. FINDINGS: CTA NECK FINDINGS Aortic arch: 4 vessel aortic arch with the left vertebral artery originating directly from the arch. Imaged portion shows no evidence of aneurysm or  dissection. Atherosclerotic changes with calcified plaques with severe stenosis at the origin of the left vertebral artery. Right carotid system: Atherosclerotic changes of the right carotid bifurcation with mixed density plaque results  approximately 50% stenosis. Left carotid system: Atherosclerotic changes of the left carotid bifurcation without hemodynamically significant stenosis. Vertebral arteries: The right vertebral artery is dominant noting atherosclerotic changes at its origin from the subclavian artery without hemodynamically significant stenosis. Normal course and caliber of the remainder of the right vertebral artery. The left vertebral artery originates directly from the aortic arch noting atherosclerotic changes with calcified plaque at its origin with severe stenosis. The left vertebral artery has diminutive caliber throughout its entire course. Skeleton: Degenerative changes of the cervical spine. No aggressive bone lesion identified. Other neck: Negative Upper chest: Biapical fibrosis. Review of the MIP images confirms the above findings CTA HEAD FINDINGS Anterior circulation: No significant stenosis, proximal occlusion, aneurysm, or vascular malformation. Posterior circulation: Diminutive left vertebral artery. 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 No aneurysm or vascular malformation. Venous sinuses: As permitted by contrast timing, patent. Anatomic variants: Bilateral fetal PCA. Right persistent trigeminal artery. Review of the MIP images confirms the above findings IMPRESSION: 1. No large vessel occlusion or high-grade stenosis of the intracranial vasculature. 2. Atherosclerotic changes of the right carotid bifurcation with approximately 50% stenosis. 3. Atherosclerotic changes of the left carotid bifurcation without hemodynamically significant stenosis. 4. 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 Aortic Atherosclerosis (ICD10-I70.0). Electronically Signed   By: Pedro Earls M.D.   On: 06/28/2020 23:13   CT HEAD WO CONTRAST  Result Date: 06/28/2020 CLINICAL DATA:  Dizziness EXAM: CT HEAD WITHOUT CONTRAST TECHNIQUE: Contiguous axial images were obtained from the base of the skull through the vertex without intravenous contrast. COMPARISON:  None. FINDINGS: Brain: There is atrophy and chronic small vessel disease changes. No acute intracranial abnormality. Specifically, no hemorrhage, hydrocephalus, mass lesion, acute infarction, or significant intracranial injury. Vascular: No hyperdense vessel or unexpected calcification. Skull: No acute calvarial abnormality. Sinuses/Orbits: Visualized paranasal sinuses and mastoids clear. Orbital soft tissues unremarkable. Other: None IMPRESSION: Atrophy, chronic microvascular disease. No acute intracranial abnormality. Electronically Signed   By: Rolm Baptise M.D.   On: 06/28/2020 16:52   CT Angio Neck W and/or Wo Contrast  Result Date: 06/28/2020 CLINICAL DATA:  Neuro deficit, acute. Stroke suspected. EXAM: CT ANGIOGRAPHY HEAD AND NECK TECHNIQUE: Multidetector CT imaging of the head and neck was performed using the standard protocol during bolus administration of intravenous contrast. Multiplanar CT image reconstructions and MIPs were obtained to evaluate the vascular anatomy. Carotid stenosis measurements (when applicable) are obtained utilizing NASCET criteria, using the distal internal carotid diameter as the denominator. CONTRAST:  142mL OMNIPAQUE IOHEXOL 350 MG/ML SOLN COMPARISON:  None. FINDINGS: CTA NECK FINDINGS Aortic arch: 4 vessel aortic arch with the left vertebral artery originating directly from the arch. Imaged portion shows no evidence of aneurysm or dissection. Atherosclerotic changes with  calcified plaques with severe stenosis at the origin of the left vertebral artery. Right carotid system: Atherosclerotic changes of the right carotid bifurcation with mixed density plaque results approximately 50% stenosis. Left carotid system: Atherosclerotic changes of the left carotid bifurcation without hemodynamically significant stenosis. Vertebral arteries: The right vertebral artery is dominant noting atherosclerotic changes at its origin from the subclavian artery without hemodynamically significant stenosis. Normal course and caliber of the remainder of the right vertebral artery. The left vertebral artery originates directly from the aortic arch noting atherosclerotic changes  with calcified plaque at its origin with severe stenosis. The left vertebral artery has diminutive caliber throughout its entire course. Skeleton: Degenerative changes of the cervical spine. No aggressive bone lesion identified. Other neck: Negative Upper chest: Biapical fibrosis. Review of the MIP images confirms the above findings CTA HEAD FINDINGS Anterior circulation: No significant stenosis, proximal occlusion, aneurysm, or vascular malformation. Posterior circulation: Diminutive left vertebral artery. 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 No aneurysm or vascular malformation. Venous sinuses: As permitted by contrast timing, patent. Anatomic variants: Bilateral fetal PCA. Right persistent trigeminal artery. Review of the MIP images confirms the above findings IMPRESSION: 1. No large vessel occlusion or high-grade stenosis of the intracranial vasculature. 2. Atherosclerotic changes of the right carotid bifurcation with approximately 50% stenosis. 3. Atherosclerotic changes of the left carotid bifurcation without hemodynamically significant stenosis. 4. 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 Aortic Atherosclerosis (ICD10-I70.0). Electronically Signed   By: Pedro Earls M.D.   On: 06/28/2020 23:13   MR BRAIN WO CONTRAST  Result Date: 06/28/2020 CLINICAL DATA:  Dizziness EXAM: MRI HEAD WITHOUT CONTRAST TECHNIQUE: Multiplanar, multiecho pulse sequences of the brain and surrounding structures were obtained without intravenous contrast. COMPARISON:  None. FINDINGS: Brain: Questionable small focus of mildly reduced diffusion along the right parasagittal dorsal pons/floor of the fourth ventricle just below the level of the trigeminal nerves (series 5, image 63). Punctate focus of susceptibility hypointensity in the central right pons is compatible with chronic microhemorrhage or mineralization. There is no intracranial mass or mass effect. There is no hydrocephalus or extra-axial fluid collection. Prominence of the ventricles and sulci reflects generalized parenchymal volume loss. Patchy and confluent areas of T2 hyperintensity in the supratentorial greater than pontine white matter are nonspecific but may reflect moderate chronic microvascular ischemic changes. Vascular: Major vessel flow voids at the skull base are preserved. Skull and upper cervical spine: Normal marrow signal is preserved. Sinuses/Orbits: Paranasal sinuses are aerated. Bilateral lens replacements. Other: Sella is unremarkable.  Mastoid air cells are clear. IMPRESSION: Questionable punctate acute infarct of the dorsal right parasagittal pons along the floor of the fourth ventricle just below the level of the trigeminal nerves. This could be artifactual. Moderate chronic microvascular ischemic changes. Electronically Signed   By: Macy Mis M.D.   On: 06/28/2020 19:40   DG Chest Portable 1 View  Result Date: 06/28/2020 CLINICAL DATA:  Chest pain. EXAM: PORTABLE CHEST 1 VIEW COMPARISON:  May 04, 2006 FINDINGS:  No pneumothorax. The cardiomediastinal silhouette is stable. The lungs are clear. IMPRESSION: No active disease. Electronically Signed   By: Dorise Bullion III M.D   On: 06/28/2020 16:50    Procedures Procedures (including critical care time)  Medications Ordered in ED Medications  sodium chloride (PF) 0.9 % injection (has no administration in time range)  acetaminophen (TYLENOL) tablet 650 mg (has no administration in time range)    Or  acetaminophen (TYLENOL) 160 MG/5ML solution 650 mg (has no administration in time range)    Or  acetaminophen (TYLENOL) suppository 650 mg (has no administration in time range)  senna-docusate (Senokot-S) tablet 1 tablet (has no administration in time range)  enoxaparin (LOVENOX) injection 40 mg (40 mg Subcutaneous Given 06/28/20 2304)  aspirin suppository 300 mg (has no administration in time range)  Or  aspirin tablet 325 mg (has no administration in time range)  labetalol (NORMODYNE) injection 10 mg (has no administration in time range)  atorvastatin (LIPITOR) tablet 40 mg (40 mg Oral Given 06/28/20 2304)  nitroGLYCERIN (NITROSTAT) SL tablet 0.4 mg (has no administration in time range)  aspirin chewable tablet 324 mg (324 mg Oral Given 06/28/20 1742)  LORazepam (ATIVAN) tablet 1 mg (1 mg Oral Given 06/28/20 1742)  sodium chloride 0.9 % bolus 1,000 mL (0 mLs Intravenous Stopped 06/28/20 1859)   stroke: mapping our early stages of recovery book (1 each Does not apply Given 06/28/20 2304)  iohexol (OMNIPAQUE) 350 MG/ML injection 100 mL (100 mLs Intravenous Contrast Given 06/28/20 2143)    ED Course  I have reviewed the triage vital signs and the nursing notes.  Pertinent labs & imaging results that were available during my care of the patient were reviewed by me and considered in my medical decision making (see chart for details).  Clinical Course as of Jun 28 2318  Nancy Fetter Jun 28, 2020  2021 Nitrite(!): POSITIVE [AN]  2021 Patient has nitrite  positive urine. She has pyuria. I discussed the finding with the patient and she reported that she has no urinary discomfort, pelvic pain, urinary urgency or frequency. She does have incontinence but that is not new. With her having no symptoms consistent with UTI and her not being febrile or have any systemic symptoms of infection, not being diabetic/immunosuppressed -we will not treat her with antibiotics at this time for what appears to be asymptomatic bacteriuria.   [AN]  2023 MRI results were reviewed by me. I had called Dr. Rory Percy, neurology. He recommends admission to [facility] with CT angiogram. He is concerned for posterior stroke. Hospitalist consult placed.  MR BRAIN WO CONTRAST [AN]  2023 Patient's chest pain had resolved after oral Ativan. Her troponins are below the institutional cutoff and the delta is also low. I had discussed case with Dr. Esmeralda Arthur, cardiology, who does not think any acute coronary work-up is needed. If patient starts developing chest pain or tropes are rising, cardiology can be consulted.  Troponin I (High Sensitivity): 6 [AN]  2024 Family made aware of the diagnosis, pending CT scans and transferred to [facility]. Patient has no complaints from her side.   [AN]    Clinical Course User Index [AN] Varney Biles, MD   MDM Rules/Calculators/A&P HEAR Score: 52                        86 year old woman comes in a chief complaint of dizziness, chest discomfort.  She has known history of aortic aneurysm, aortic stenosis, CAD, HTN, HL.  No history of strokes.  Given that she is having both chest discomfort and headache at the same time and there is known history of aortic aneurysm, dissection is in differential.  Other possibilities include stroke for the ataxia.  Electrolyte abnormalities also possible but less likely. Possibilities for chest pain include ACS, worsening aneurysm and aortic stenosis.    Final Clinical Impression(s) / ED Diagnoses Final diagnoses:    Acute stroke due to ischemia [facility])    Rx / DC Orders ED Discharge Orders    None       Varney Biles, MD 06/28/20 2319

## 2020-06-29 ENCOUNTER — Observation Stay (HOSPITAL_COMMUNITY): Payer: Medicare Other

## 2020-06-29 ENCOUNTER — Other Ambulatory Visit (HOSPITAL_COMMUNITY): Payer: Medicare Other

## 2020-06-29 DIAGNOSIS — I358 Other nonrheumatic aortic valve disorders: Secondary | ICD-10-CM | POA: Diagnosis present

## 2020-06-29 DIAGNOSIS — Z8249 Family history of ischemic heart disease and other diseases of the circulatory system: Secondary | ICD-10-CM | POA: Diagnosis not present

## 2020-06-29 DIAGNOSIS — I6521 Occlusion and stenosis of right carotid artery: Secondary | ICD-10-CM | POA: Diagnosis present

## 2020-06-29 DIAGNOSIS — I2510 Atherosclerotic heart disease of native coronary artery without angina pectoris: Secondary | ICD-10-CM | POA: Diagnosis present

## 2020-06-29 DIAGNOSIS — Z20822 Contact with and (suspected) exposure to covid-19: Secondary | ICD-10-CM | POA: Diagnosis present

## 2020-06-29 DIAGNOSIS — I10 Essential (primary) hypertension: Secondary | ICD-10-CM | POA: Diagnosis present

## 2020-06-29 DIAGNOSIS — I350 Nonrheumatic aortic (valve) stenosis: Secondary | ICD-10-CM | POA: Diagnosis not present

## 2020-06-29 DIAGNOSIS — I6389 Other cerebral infarction: Secondary | ICD-10-CM | POA: Diagnosis not present

## 2020-06-29 DIAGNOSIS — R260 Ataxic gait: Secondary | ICD-10-CM | POA: Diagnosis present

## 2020-06-29 DIAGNOSIS — M8580 Other specified disorders of bone density and structure, unspecified site: Secondary | ICD-10-CM | POA: Diagnosis present

## 2020-06-29 DIAGNOSIS — Z803 Family history of malignant neoplasm of breast: Secondary | ICD-10-CM | POA: Diagnosis not present

## 2020-06-29 DIAGNOSIS — Z79899 Other long term (current) drug therapy: Secondary | ICD-10-CM | POA: Diagnosis not present

## 2020-06-29 DIAGNOSIS — R8271 Bacteriuria: Secondary | ICD-10-CM | POA: Diagnosis present

## 2020-06-29 DIAGNOSIS — R0789 Other chest pain: Secondary | ICD-10-CM | POA: Diagnosis present

## 2020-06-29 DIAGNOSIS — M1990 Unspecified osteoarthritis, unspecified site: Secondary | ICD-10-CM | POA: Diagnosis present

## 2020-06-29 DIAGNOSIS — I639 Cerebral infarction, unspecified: Secondary | ICD-10-CM | POA: Diagnosis present

## 2020-06-29 DIAGNOSIS — E785 Hyperlipidemia, unspecified: Secondary | ICD-10-CM | POA: Diagnosis present

## 2020-06-29 DIAGNOSIS — Z823 Family history of stroke: Secondary | ICD-10-CM | POA: Diagnosis not present

## 2020-06-29 DIAGNOSIS — R29700 NIHSS score 0: Secondary | ICD-10-CM | POA: Diagnosis present

## 2020-06-29 DIAGNOSIS — I6381 Other cerebral infarction due to occlusion or stenosis of small artery: Secondary | ICD-10-CM | POA: Diagnosis present

## 2020-06-29 LAB — CBC
HCT: 36.9 % (ref 36.0–46.0)
Hemoglobin: 12 g/dL (ref 12.0–15.0)
MCH: 32.4 pg (ref 26.0–34.0)
MCHC: 32.5 g/dL (ref 30.0–36.0)
MCV: 99.7 fL (ref 80.0–100.0)
Platelets: 226 10*3/uL (ref 150–400)
RBC: 3.7 MIL/uL — ABNORMAL LOW (ref 3.87–5.11)
RDW: 12.9 % (ref 11.5–15.5)
WBC: 6.2 10*3/uL (ref 4.0–10.5)
nRBC: 0 % (ref 0.0–0.2)

## 2020-06-29 LAB — LIPID PANEL
Cholesterol: 160 mg/dL (ref 0–200)
HDL: 40 mg/dL — ABNORMAL LOW (ref >40)
LDL Cholesterol: 93 mg/dL (ref 0–99)
Total CHOL/HDL Ratio: 4 RATIO
Triglycerides: 133 mg/dL (ref <150)
VLDL: 27 mg/dL (ref 0–40)

## 2020-06-29 LAB — BASIC METABOLIC PANEL
Anion gap: 11 (ref 5–15)
BUN: 24 mg/dL — ABNORMAL HIGH (ref 8–23)
CO2: 24 mmol/L (ref 22–32)
Calcium: 8.5 mg/dL — ABNORMAL LOW (ref 8.9–10.3)
Chloride: 105 mmol/L (ref 98–111)
Creatinine, Ser: 0.73 mg/dL (ref 0.44–1.00)
GFR, Estimated: >60 mL/min (ref >60)
Glucose, Bld: 113 mg/dL — ABNORMAL HIGH (ref 70–99)
Potassium: 3.8 mmol/L (ref 3.5–5.1)
Sodium: 140 mmol/L (ref 135–145)

## 2020-06-29 LAB — ECHOCARDIOGRAM COMPLETE
AR max vel: 1.14 cm2
AV Area VTI: 1.2 cm2
AV Area mean vel: 1.24 cm2
AV Mean grad: 14 mmHg
AV Peak grad: 24.6 mmHg
Ao pk vel: 2.48 m/s
Area-P 1/2: 3.31 cm2
S' Lateral: 1.9 cm

## 2020-06-29 LAB — HEMOGLOBIN A1C
Hgb A1c MFr Bld: 6.2 % — ABNORMAL HIGH (ref 4.8–5.6)
Mean Plasma Glucose: 131.24 mg/dL

## 2020-06-29 MED ORDER — CLOPIDOGREL BISULFATE 75 MG PO TABS
300.0000 mg | ORAL_TABLET | Freq: Once | ORAL | Status: AC
  Administered 2020-06-30: 300 mg via ORAL
  Filled 2020-06-29: qty 4

## 2020-06-29 MED ORDER — CLOPIDOGREL BISULFATE 75 MG PO TABS
75.0000 mg | ORAL_TABLET | Freq: Every day | ORAL | Status: DC
  Administered 2020-06-30: 75 mg via ORAL
  Filled 2020-06-29: qty 1

## 2020-06-29 NOTE — ED Notes (Signed)
[facility] called for transport. 

## 2020-06-29 NOTE — ED Notes (Signed)
Lunch tray at bedside. ?

## 2020-06-29 NOTE — Consult Note (Signed)
Neurology Consultation Reason for Consult: Unsteadiness Referring Physician: Jabier Mutton  CC: Unsteadiness  History is obtained from: Patient, family  HPI: Angel [name] is a 86 y.o. female with a history of CAD, HTN, hyperlipidemia who presents with unsteadiness that began yesterday morning.  She states that she went to bed feeling relatively normal on Saturday night, and then when she awoke Sunday morning and tried to get out of bed, she noticed that she was going to the left and that the room was spinning around her.  The symptoms were persistent throughout the day, gradually improving and by this morning she felt like she was much improved.   She denies any numbness, weakness, double vision, visual change or other symptoms with this episode.  LKW: 11/20 prior to bed tpa given?: no, outside of window    ROS: A 14 point ROS was performed and is negative except as noted in the HPI.   Past Medical History:  Diagnosis Date   Anxiety    Aortic aneurysm without rupture (HCC)    CAD (coronary artery disease)    Chronic hip pain    Endometriosis    Hearing loss    HTN, goal below 150/90    Hyperlipidemia LDL goal <70 11/18/2014   Osteopenia of the elderly    Urge and stress incontinence      Family History  Problem Relation Age of Onset   CVA Mother    Stroke Mother    Breast cancer Maternal Grandmother 72   Heart disease Maternal Grandmother    Cancer Brother        skin     Social History:  reports that she has never smoked. She has never used smokeless tobacco. She reports current alcohol use of about 1.0 standard drink of alcohol per week. She reports that she does not use drugs.   Exam: Current vital signs: BP (!) 135/57 (BP Location: Right Arm)    Pulse 90    Temp 98.7 F (37.1 C) (Oral)    Resp (!) 21    LMP  (LMP Unknown)    SpO2 94%  Vital signs in last 24 hours: Temp:  [98.7 F (37.1 C)] 98.7 F (37.1 C) (11/22 1558) Pulse Rate:  [67-105]  90 (11/22 1558) Resp:  [12-29] 21 (11/22 1558) BP: (108-176)/(54-103) 135/57 (11/22 1558) SpO2:  [87 %-96 %] 94 % (11/22 1558)   Physical Exam  Constitutional: Appears well-developed and well-nourished.  Psych: Affect appropriate to situation Eyes: No scleral injection HENT: No OP obstrucion MSK: no joint deformities.  Cardiovascular: Normal rate and regular rhythm.  Respiratory: Effort normal, non-labored breathing GI: Soft.  No distension. There is no tenderness.  Skin: WDI  Neuro: Mental Status: Patient is awake, alert, oriented to person, place, month, year, and situation. Patient is able to give a clear and coherent history. No signs of aphasia or neglect Cranial Nerves: II: Visual Fields are full. Pupils are equal, round, and reactive to light.   III,IV, VI: EOMI without diploplia. R ptosis(old per patient) V: Facial sensation is symmetric to temperature VII: decreased smile on the right.  VIII: hearing is intact to voice X: Uvula elevates symmetrically XI: Shoulder shrug is symmetric. XII: tongue is midline without atrophy or fasciculations.  Motor: Tone is normal. Bulk is normal. 5/5 strength was present in all four extremities.  Sensory: Sensation is symmetric to light touch and temperature in the arms and legs. Cerebellar: FNF and HKS are intact bilaterally   I have  reviewed labs in epic and the results pertinent to this consultation are: Creatinine 0.7 LDL 93 CTA head and neck-small posterior circulation due to fetal PCAs and persistent right trigeminal artery, no findings of significance.  Echocardiogram-no clear embolic source  I have reviewed the images obtained: MRI brain-small diffusion abnormality in the pons.  Impression: 86 year old female with symptoms and imaging findings most consistent with acute ischemic stroke.  I suspect small vessel disease, and she will need to be managed for this.  With her LDL of 93, I would favor more aggressive statin  therapy.  At this point, her stroke work-up is completed, no further recommendations unless atrial fibrillation is detected on telemetry.  Recommendations: 1) increase atorvastatin to 80 mg daily 2) dual antiplatelet therapy with aspirin 81 mg and Plavix 75 mg daily x3 weeks followed by Plavix monotherapy 3) PT, OT, ST 4) continue telemetry 5) follow-up with neurology as an outpatient.  Roland Rack, MD [facility] [phone]  If 7pm- 7am, please page neurology on call as listed in [place].

## 2020-06-29 NOTE — Evaluation (Signed)
Occupational Therapy Evaluation and Discharge Patient Details Name: Angel [name] MRN: [id number] DOB: 1930/11/29 Today's Date: 06/29/2020    History of Present Illness Angel [name] is a 86 y.o. female with medical history significant for hypertension, hyperlipidemia, presumed CAD, and osteoarthritis who presents to the ED for evaluation of dizziness and unsteady gait--fell twice at home right before admission.Marland KitchenMRI brain without contrast shows questionable punctate acute infarct of the dorsal right parasagittal pons along the floor of the fourth ventricle just below the level of the trigeminal nerves.   Clinical Impression   This 86 yo female admitted with above presents to acute with PLOF of being independent to Mod I and currently is Mod I with all basic ADLs within this hospital environment and is aware she is moving slower due to still feeling a little unsteady. No further OT needs, we will sign off.    Follow Up Recommendations  No OT follow up    Equipment Recommendations  None recommended by OT       Precautions / Restrictions Precautions Precautions: Fall Restrictions Weight Bearing Restrictions: No      Mobility Bed Mobility Overal bed mobility: Independent             General bed mobility comments: Tends to flop back into bed--"I know I shouldn't do that"    Transfers Overall transfer level: Modified independent Equipment used: None             General transfer comment: Takes her time    Balance Overall balance assessment: Mild deficits observed, not formally tested                                         ADL either performed or assessed with clinical judgement   ADL Overall ADL's : Modified independent                                       General ADL Comments: Increased time. We did discuss that a shower seat would make it safer for showering versus just the grab bar she already has. Made her aware insurance  does not pay for them and she can get one on her own if she decides to.     Vision Baseline Vision/History: Wears glasses Wears Glasses: Distance only Patient Visual Report: No change from baseline Vision Assessment?: Yes Eye Alignment: Within Functional Limits Ocular Range of Motion: Within Functional Limits Alignment/Gaze Preference: Within Defined Limits Tracking/Visual Pursuits: Able to track stimulus in all quads without difficulty Saccades: Within functional limits Convergence: Within functional limits Visual Fields: No apparent deficits            Pertinent Vitals/Pain Pain Assessment: No/denies pain     Hand Dominance Right   Extremity/Trunk Assessment Upper Extremity Assessment Upper Extremity Assessment: Overall WFL for tasks assessed           Communication Communication Communication: No difficulties   Cognition Arousal/Alertness: Awake/alert Behavior During Therapy: WFL for tasks assessed/performed Overall Cognitive Status: Within Functional Limits for tasks assessed                                     General Comments  When having pt try to walk down the hallway and  stay in the center she had a difficult time and had to decrease her speed of walking, her tendency was to go left            Home Living Family/patient expects to be discharged to:: Private residence Living Arrangements: Alone Available Help at Discharge: Family;Friend(s);Neighbor (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; alone at times at night) Type of Home: House Home Access: Level entry     North: Two level;Able to live on main level with bedroom/bathroom Alternate Level Stairs-Number of Steps: downstairs area she does not have to go to   [employer] Shower/Tub: Occupational psychologist: Standard     Home Equipment: Grab bars - toilet;Hand held shower head;Grab bars - tub/shower          Prior  Functioning/Environment Level of Independence: Independent        Comments: Drives, 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)        OT Problem List: Impaired balance (sitting and/or standing)         OT Goals(Current goals can be found in the care plan section) Acute Rehab OT Goals Patient Stated Goal: to learn more about strokes  OT Frequency:                AM-PAC OT "6 Clicks" Daily Activity     Outcome Measure Help from another person eating meals?: None Help from another person taking care of personal grooming?: None Help from another person toileting, which includes using toliet, bedpan, or urinal?: None Help from another person bathing (including washing, rinsing, drying)?: None Help from another person to put on and taking off regular upper body clothing?: None Help from another person to put on and taking off regular lower body clothing?: None 6 Click Score: 24   End of Session Nurse Communication: Mobility status  Activity Tolerance: Patient tolerated treatment well Patient left: in bed;with call bell/phone within reach  OT Visit Diagnosis: Unsteadiness on feet (R26.81);Other abnormalities of gait and mobility (R26.89);History of falling (Z91.81)                Time: 0370-9643 OT Time Calculation (min): 30 min Charges:  OT General Charges $OT Visit: 1 Visit OT Evaluation $OT Eval Moderate Complexity: 1 Mod OT Treatments $Self Care/Home Management : 8-22 mins  [address] Circle, OTR/L Acute [employer] Pager [phone] Office [phone]     Almon Register 06/29/2020, 8:35 AM

## 2020-06-29 NOTE — ED Notes (Signed)
Pt given items for brushing her teeth and cleaning her face.

## 2020-06-29 NOTE — Progress Notes (Signed)
PROGRESS NOTE    Angel [name]  CBJ:[id number] DOB: Sep 17, 1930 DOA: 06/28/2020 PCP: Delsa Grana, PA-C    Chief Complaint  Patient presents with  . Chest Pain  . Dizziness    Brief Narrative: 86 year old lady prior history of hypertension, hyperlipidemia, osteoarthritis admitted for dizziness and unsteady gait.  MRI of the brain without contrast shows questionable punctate acute infarct of the dorsal right parasagittal pons along the floor of the fourth ventricle just below the level of the trigeminal nerves.  Neurology consulted recommended transfer the patient to [facility] for further evaluation and management.  CT angio of the head and neck showsNo large vessel occlusion or high-grade stenosis of the intracranial vasculature. Atherosclerotic changes of the right carotid bifurcation with approximately 50% stenosis. Atherosclerotic changes of the left carotid bifurcation without hemodynamically significant stenosis. He was admitted to [facility] for further evaluation.   Assessment & Plan:   Principal Problem:   Acute CVA (cerebrovascular accident) ([place]) Active Problems:   Hypertension goal BP (blood pressure) < 140/90   Hyperlipidemia LDL goal <70   Asymptomatic bacteriuria   Chest pain   Acute CVA Posterior ischemic CVA. Further stroke work-up in progress. Reviewed CT angiogram of the head and neck does not show any large vessel occlusion or high-grade stenosis of the intracranial vasculature. Echocardiogram ordered. Recommend to continue with aspirin 325 mg along with atorvastatin 40 mg daily. Lipid panel shows HDL of 40, LDL of 93, A1c 6.2. Neurology consulted and Dr. Leonie Man will see the patient. Therapy evaluations are pending at this time.    Essential hypertension Permissive hypertension for the next 36 hours.   Hyperlipidemia Continue with Lipitor 40 mg daily.    Abnormal urine analysis Urine cultures will be ordered. Patient is currently  asymptomatic.   DVT prophylaxis: Lovenox Code Status: Full code Family Communication: Discussed with daughter-in-law at bedside Disposition:   Status is: Observation  The patient will require care spanning > 2 midnights and should be moved to inpatient because: Ongoing diagnostic testing needed not appropriate for outpatient work up  Dispo: The patient is from: Home              Anticipated d/c is to: Home              Anticipated d/c date is: 1 day              Patient currently is not medically stable to d/c.       Consultants:   Neurology   Procedures:   Echocardiogram  CT angio of the head and neck   Antimicrobials: none.    Subjective: NO NEW COMPLAINTS   Objective: Vitals:   06/29/20 1100 06/29/20 1130 06/29/20 1200 06/29/20 1321  BP: 118/67 130/69 137/73 (!) 148/88  Pulse: 100 96 94 (!) 105  Resp: (!) 21 13 20  (!) 22  Temp:      TempSrc:      SpO2: 93% 93% 93% 94%    Intake/Output Summary (Last 24 hours) at 06/29/2020 1453 Last data filed at 06/28/2020 1859 Gross per 24 hour  Intake 366.3 ml  Output --  Net 366.3 ml   There were no vitals filed for this visit.  Examination:  General exam: Appears calm and comfortable  Respiratory system: Clear to auscultation. Respiratory effort normal. Cardiovascular system: S1 & S2 heard, RRR. No JVD, . No pedal edema. Gastrointestinal system: Abdomen is nondistended, soft and nontender.  Normal bowel sounds heard. Central nervous system: Alert and oriented. No  focal neurological deficits. Extremities: Symmetric 5 x 5 power. Skin: No rashes, lesions or ulcers Psychiatry:  Mood & affect appropriate.     Data Reviewed: I have personally reviewed following labs and imaging studies  CBC: Recent Labs  Lab 06/28/20 1635 06/28/20 1647 06/29/20 0500  WBC 8.1  --  6.2  NEUTROABS 5.9  --   --   HGB 13.9 13.6 12.0  HCT 42.7 40.0 36.9  MCV 99.3  --  99.7  PLT 270  --  812    Basic Metabolic  Panel: Recent Labs  Lab 06/28/20 1613 06/28/20 1647 06/29/20 0500  NA 143 144 140  K 4.1 3.8 3.8  CL 108 107 105  CO2 25  --  24  GLUCOSE 107* 93 113*  BUN 29* 30* 24*  CREATININE 0.71 0.60 0.73  CALCIUM 9.4  --  8.5*    GFR: CrCl cannot be calculated (Unknown ideal weight.).  Liver Function Tests: Recent Labs  Lab 06/28/20 1613  AST 26  ALT 28  ALKPHOS 48  BILITOT 0.7  PROT 7.5  ALBUMIN 4.0    CBG: Recent Labs  Lab 06/28/20 1618  GLUCAP 98     Recent Results (from the past 240 hour(s))  Resp Panel by RT-PCR (Flu A&B, Covid) Nasopharyngeal Swab     Status: None   Collection Time: 06/28/20  9:37 PM   Specimen: Nasopharyngeal Swab; Nasopharyngeal(NP) swabs in vial transport medium  Result Value Ref Range Status   SARS Coronavirus 2 by RT PCR NEGATIVE NEGATIVE Final    Comment: (NOTE) SARS-CoV-2 target nucleic acids are NOT DETECTED.  The SARS-CoV-2 RNA is generally detectable in upper respiratory specimens during the acute phase of infection. The lowest concentration of SARS-CoV-2 viral copies this assay can detect is 138 copies/mL. A negative result does not preclude SARS-Cov-2 infection and should not be used as the sole basis for treatment or other patient management decisions. A negative result may occur with  improper specimen collection/handling, submission of specimen other than nasopharyngeal swab, presence of viral mutation(s) within the areas targeted by this assay, and inadequate number of viral copies(<138 copies/mL). A negative result must be combined with clinical observations, patient history, and epidemiological information. The expected result is Negative.  Fact Sheet for Patients:  EntrepreneurPulse.com.au  Fact Sheet for Healthcare Providers:  IncredibleEmployment.be  This test is no t yet approved or cleared by the [place] FDA and  has been authorized for detection and/or diagnosis of  SARS-CoV-2 by FDA under an Emergency Use Authorization (EUA). This EUA will remain  in effect (meaning this test can be used) for the duration of the COVID-19 declaration under Section 564(b)(1) of the Act, 21 U.S.C.section 360bbb-3(b)(1), unless the authorization is terminated  or revoked sooner.       Influenza A by PCR NEGATIVE NEGATIVE Final   Influenza B by PCR NEGATIVE NEGATIVE Final    Comment: (NOTE) The Xpert Xpress SARS-CoV-2/FLU/RSV plus assay is intended as an aid in the diagnosis of influenza from Nasopharyngeal swab specimens and should not be used as a sole basis for treatment. Nasal washings and aspirates are unacceptable for Xpert Xpress SARS-CoV-2/FLU/RSV testing.  Fact Sheet for Patients: EntrepreneurPulse.com.au  Fact Sheet for Healthcare Providers: IncredibleEmployment.be  This test is not yet approved or cleared by the [place] FDA and has been authorized for detection and/or diagnosis of SARS-CoV-2 by FDA under an Emergency Use Authorization (EUA). This EUA will remain in effect (meaning this test can be used) for  the duration of the COVID-19 declaration under Section 564(b)(1) of the Act, 21 U.S.C. section 360bbb-3(b)(1), unless the authorization is terminated or revoked.  Performed at [facility], [place] [address], [place], [place] [address]          Radiology Studies: CT Angio Head W or Wo Contrast  Result Date: 06/28/2020 CLINICAL DATA:  Neuro deficit, acute. Stroke suspected. EXAM: CT ANGIOGRAPHY HEAD AND NECK TECHNIQUE: Multidetector CT imaging of the head and neck was performed using the standard protocol during bolus administration of intravenous contrast. Multiplanar CT image reconstructions and MIPs were obtained to evaluate the vascular anatomy. Carotid stenosis measurements (when applicable) are obtained utilizing NASCET criteria, using the distal internal carotid diameter as the  denominator. CONTRAST:  180mL OMNIPAQUE IOHEXOL 350 MG/ML SOLN COMPARISON:  None. FINDINGS: CTA NECK FINDINGS Aortic arch: 4 vessel aortic arch with the left vertebral artery originating directly from the arch. Imaged portion shows no evidence of aneurysm or dissection. Atherosclerotic changes with calcified plaques with severe stenosis at the origin of the left vertebral artery. Right carotid system: Atherosclerotic changes of the right carotid bifurcation with mixed density plaque results approximately 50% stenosis. Left carotid system: Atherosclerotic changes of the left carotid bifurcation without hemodynamically significant stenosis. Vertebral arteries: The right vertebral artery is dominant noting atherosclerotic changes at its origin from the subclavian artery without hemodynamically significant stenosis. Normal course and caliber of the remainder of the right vertebral artery. The left vertebral artery originates directly from the aortic arch noting atherosclerotic changes with calcified plaque at its origin with severe stenosis. The left vertebral artery has diminutive caliber throughout its entire course. Skeleton: Degenerative changes of the cervical spine. No aggressive bone lesion identified. Other neck: Negative Upper chest: Biapical fibrosis. Review of the MIP images confirms the above findings CTA HEAD FINDINGS Anterior circulation: No significant stenosis, proximal occlusion, aneurysm, or vascular malformation. Posterior circulation: Diminutive left vertebral artery. 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 No aneurysm or vascular malformation. Venous sinuses: As permitted by contrast timing, patent. Anatomic variants: Bilateral fetal PCA. Right persistent trigeminal artery. Review of the MIP images confirms the above findings IMPRESSION: 1. No large vessel occlusion or high-grade  stenosis of the intracranial vasculature. 2. Atherosclerotic changes of the right carotid bifurcation with approximately 50% stenosis. 3. Atherosclerotic changes of the left carotid bifurcation without hemodynamically significant stenosis. 4. 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 Aortic Atherosclerosis (ICD10-I70.0). Electronically Signed   By: Pedro Earls M.D.   On: 06/28/2020 23:13   CT HEAD WO CONTRAST  Result Date: 06/28/2020 CLINICAL DATA:  Dizziness EXAM: CT HEAD WITHOUT CONTRAST TECHNIQUE: Contiguous axial images were obtained from the base of the skull through the vertex without intravenous contrast. COMPARISON:  None. FINDINGS: Brain: There is atrophy and chronic small vessel disease changes. No acute intracranial abnormality. Specifically, no hemorrhage, hydrocephalus, mass lesion, acute infarction, or significant intracranial injury. Vascular: No hyperdense vessel or unexpected calcification. Skull: No acute calvarial abnormality. Sinuses/Orbits: Visualized paranasal sinuses and mastoids clear. Orbital soft tissues unremarkable. Other: None IMPRESSION: Atrophy, chronic microvascular disease. No acute intracranial abnormality. Electronically Signed   By: Rolm Baptise M.D.   On: 06/28/2020 16:52   CT Angio Neck W and/or Wo Contrast  Result Date: 06/28/2020 CLINICAL DATA:  Neuro deficit,  acute. Stroke suspected. EXAM: CT ANGIOGRAPHY HEAD AND NECK TECHNIQUE: Multidetector CT imaging of the head and neck was performed using the standard protocol during bolus administration of intravenous contrast. Multiplanar CT image reconstructions and MIPs were obtained to evaluate the vascular anatomy. Carotid stenosis measurements (when applicable) are obtained utilizing NASCET criteria, using the distal internal carotid diameter as the denominator. CONTRAST:  168mL OMNIPAQUE  IOHEXOL 350 MG/ML SOLN COMPARISON:  None. FINDINGS: CTA NECK FINDINGS Aortic arch: 4 vessel aortic arch with the left vertebral artery originating directly from the arch. Imaged portion shows no evidence of aneurysm or dissection. Atherosclerotic changes with calcified plaques with severe stenosis at the origin of the left vertebral artery. Right carotid system: Atherosclerotic changes of the right carotid bifurcation with mixed density plaque results approximately 50% stenosis. Left carotid system: Atherosclerotic changes of the left carotid bifurcation without hemodynamically significant stenosis. Vertebral arteries: The right vertebral artery is dominant noting atherosclerotic changes at its origin from the subclavian artery without hemodynamically significant stenosis. Normal course and caliber of the remainder of the right vertebral artery. The left vertebral artery originates directly from the aortic arch noting atherosclerotic changes with calcified plaque at its origin with severe stenosis. The left vertebral artery has diminutive caliber throughout its entire course. Skeleton: Degenerative changes of the cervical spine. No aggressive bone lesion identified. Other neck: Negative Upper chest: Biapical fibrosis. Review of the MIP images confirms the above findings CTA HEAD FINDINGS Anterior circulation: No significant stenosis, proximal occlusion, aneurysm, or vascular malformation. Posterior circulation: Diminutive left vertebral artery. 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 No aneurysm or vascular malformation. Venous sinuses: As permitted by contrast timing, patent. Anatomic variants: Bilateral fetal PCA. Right persistent trigeminal artery. Review of the MIP images confirms the above findings IMPRESSION: 1. No large vessel occlusion or high-grade stenosis of the intracranial vasculature. 2.  Atherosclerotic changes of the right carotid bifurcation with approximately 50% stenosis. 3. Atherosclerotic changes of the left carotid bifurcation without hemodynamically significant stenosis. 4. 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 Aortic Atherosclerosis (ICD10-I70.0). Electronically Signed   By: Pedro Earls M.D.   On: 06/28/2020 23:13   MR BRAIN WO CONTRAST  Result Date: 06/28/2020 CLINICAL DATA:  Dizziness EXAM: MRI HEAD WITHOUT CONTRAST TECHNIQUE: Multiplanar, multiecho pulse sequences of the brain and surrounding structures were obtained without intravenous contrast. COMPARISON:  None. FINDINGS: Brain: Questionable small focus of mildly reduced diffusion along the right parasagittal dorsal pons/floor of the fourth ventricle just below the level of the trigeminal nerves (series 5, image 63). Punctate focus of susceptibility hypointensity in the central right pons is compatible with chronic microhemorrhage or mineralization. There is no intracranial mass or mass effect. There is no hydrocephalus or extra-axial fluid collection. Prominence of the ventricles and sulci reflects generalized parenchymal volume loss. Patchy and confluent areas of T2 hyperintensity in the supratentorial greater than pontine white matter are nonspecific but may reflect moderate chronic microvascular ischemic changes. Vascular: Major vessel flow voids at the skull base are preserved. Skull and upper cervical spine: Normal marrow signal is preserved. Sinuses/Orbits: Paranasal sinuses are aerated. Bilateral lens replacements. Other: Sella is unremarkable.  Mastoid air cells are clear. IMPRESSION: Questionable punctate acute infarct of the dorsal right parasagittal pons along the floor of the fourth ventricle just below  the level of the trigeminal nerves. This could be artifactual. Moderate chronic  microvascular ischemic changes. Electronically Signed   By: Macy Mis M.D.   On: 06/28/2020 19:40   DG Chest Portable 1 View  Result Date: 06/28/2020 CLINICAL DATA:  Chest pain. EXAM: PORTABLE CHEST 1 VIEW COMPARISON:  May 04, 2006 FINDINGS: No pneumothorax. The cardiomediastinal silhouette is stable. The lungs are clear. IMPRESSION: No active disease. Electronically Signed   By: Dorise Bullion III M.D   On: 06/28/2020 16:50   ECHOCARDIOGRAM COMPLETE  Result Date: 06/29/2020    ECHOCARDIOGRAM REPORT   Patient Name:   Angel [name] Date of Exam: 06/29/2020 Medical Rec #:  [id number]       Height:       64.0 in Accession #:    [id number]      Weight:       194.8 lb Date of Birth:  [birth year]       BSA:          1.935 m Patient Age:    66 years        BP:           137/73 mmHg Patient Gender: F               HR:           93 bpm. Exam Location:  Inpatient Procedure: 2D Echo, Color Doppler and Cardiac Doppler Indications:    Stroke i163.9  History:        Patient has prior history of Echocardiogram examinations, most                 recent 02/04/2019. CAD; Risk Factors:Hypertension and                 Dyslipidemia.  Sonographer:    Raquel Sarna Senior RDCS Referring Phys: [id number] [place]  Sonographer Comments: Technically difficult study due to poor echo windows. Apical window too off axis for wall motion, obtained wall mation from short axis. IMPRESSIONS  1. Left ventricular ejection fraction, by estimation, is 60 to 65%. The left ventricle has normal function. The left ventricle has no regional wall motion abnormalities. There is mild left ventricular hypertrophy. Left ventricular diastolic parameters are consistent with Grade I diastolic dysfunction (impaired relaxation).  2. Right ventricular systolic function is normal. The right ventricular size is normal.  3. Left atrial size was moderately dilated.  4. The mitral valve is grossly normal. Trivial mitral valve regurgitation.  5. The aortic  valve was not well visualized. Aortic valve regurgitation is not visualized. Moderate aortic valve stenosis. Aortic valve area, by VTI measures 1.20 cm. Aortic valve mean gradient measures 14.0 mmHg. Aortic valve Vmax measures 2.48 m/s. DI is 0.35.  6. Aortic dilatation noted. There is mild dilatation of the ascending aorta, measuring 38 mm.  7. The inferior vena cava is normal in size with <50% respiratory variability, suggesting right atrial pressure of 8 mmHg. Comparison(s): Prior images reviewed side by side. Changes from prior study are noted. 02/04/2019: LVEF 60-65%, mild AS - mean gradient 13 mmHg, aorta measures 38 mm. FINDINGS  Left Ventricle: Left ventricular ejection fraction, by estimation, is 60 to 65%. The left ventricle has normal function. The left ventricle has no regional wall motion abnormalities. The left ventricular internal cavity size was normal in size. There is  mild left ventricular hypertrophy. Left ventricular diastolic parameters are consistent with Grade I diastolic dysfunction (impaired relaxation). Indeterminate filling pressures. Right Ventricle: The  right ventricular size is normal. No increase in right ventricular wall thickness. Right ventricular systolic function is normal. Left Atrium: Left atrial size was moderately dilated. Right Atrium: Right atrial size was normal in size. Pericardium: There is no evidence of pericardial effusion. Mitral Valve: The mitral valve is grossly normal. Trivial mitral valve regurgitation. Tricuspid Valve: The tricuspid valve is grossly normal. Tricuspid valve regurgitation is trivial. Aortic Valve: The aortic valve was not well visualized. There is moderate calcification of the aortic valve. Aortic valve regurgitation is not visualized. Moderate aortic stenosis is present. Aortic valve mean gradient measures 14.0 mmHg. Aortic valve peak gradient measures 24.6 mmHg. Aortic valve area, by VTI measures 1.20 cm. Pulmonic Valve: The pulmonic valve was  normal in structure. Pulmonic valve regurgitation is not visualized. Aorta: Aortic dilatation noted. There is mild dilatation of the ascending aorta, measuring 38 mm. Venous: The inferior vena cava is normal in size with less than 50% respiratory variability, suggesting right atrial pressure of 8 mmHg. IAS/Shunts: No atrial level shunt detected by color flow Doppler.  LEFT VENTRICLE PLAX 2D LVIDd:         2.90 cm  Diastology LVIDs:         1.90 cm  LV e' medial:    4.13 cm/s LV PW:         1.50 cm  LV E/e' medial:  10.9 LV IVS:        1.10 cm  LV e' lateral:   6.96 cm/s LVOT diam:     2.10 cm  LV E/e' lateral: 6.5 LV SV:         59 LV SV Index:   30 LVOT Area:     3.46 cm  RIGHT VENTRICLE RV S prime:     13.90 cm/s TAPSE (M-mode): 1.7 cm LEFT ATRIUM             Index       RIGHT ATRIUM           Index LA diam:        3.50 cm 1.81 cm/m  RA Area:     17.10 cm LA Vol (A2C):   53.7 ml 27.76 ml/m RA Volume:   43.20 ml  22.33 ml/m LA Vol (A4C):   84.2 ml 43.52 ml/m LA Biplane Vol: 71.8 ml 37.11 ml/m  AORTIC VALVE AV Area (Vmax):    1.14 cm AV Area (Vmean):   1.24 cm AV Area (VTI):     1.20 cm AV Vmax:           248.00 cm/s AV Vmean:          172.000 cm/s AV VTI:            0.491 m AV Peak Grad:      24.6 mmHg AV Mean Grad:      14.0 mmHg LVOT Vmax:         81.90 cm/s LVOT Vmean:        61.750 cm/s LVOT VTI:          0.170 m LVOT/AV VTI ratio: 0.35  AORTA Ao Root diam: 3.10 cm Ao Asc diam:  3.80 cm MITRAL VALVE MV Area (PHT): 3.31 cm    SHUNTS MV Decel Time: 229 msec    Systemic VTI:  0.17 m MV E velocity: 45.00 cm/s  Systemic Diam: 2.10 cm MV A velocity: 87.80 cm/s MV E/A ratio:  0.51 Lyman Bishop MD Electronically signed by Lyman Bishop MD Signature Date/Time: 06/29/2020/2:13:16 PM  Final         Scheduled Meds: . aspirin  300 mg Rectal Daily   Or  . aspirin  325 mg Oral Daily  . atorvastatin  40 mg Oral QHS  . enoxaparin (LOVENOX) injection  40 mg Subcutaneous Q24H   Continuous  Infusions:   LOS: 0 days        Hosie Poisson, MD [facility]   To contact the attending provider between 7A-7P or the covering provider during after hours 7P-7A, please log into the web site [email or web address] and access using universal [place] password for that web site. If you do not have the password, please call the hospital operator.  06/29/2020, 2:53 PM

## 2020-06-29 NOTE — ED Notes (Signed)
Hospitalist paged regarding bed for possible admission here.

## 2020-06-29 NOTE — ED Notes (Signed)
Pt ambulated to restroom and back to room with minimal assistance.

## 2020-06-29 NOTE — ED Notes (Signed)
Pt awake alert, pleasant and cooperative. Denies any pain, dizziness,or discomfort. Ambulatory to restroom without difficulty.

## 2020-06-29 NOTE — ED Notes (Signed)
Hospitalist returned page and spoke with pt's daughter in law [place]. Pt will be going to [facility] for admission.

## 2020-06-29 NOTE — Evaluation (Signed)
Physical Therapy Evaluation Patient Details Name: Angel [name] MRN: [id number] DOB: 01-20-1931 Today's Date: 06/29/2020   History of Present Illness  Angel [name] is a 86 y.o. female with medical history significant for hypertension, hyperlipidemia, presumed CAD, and osteoarthritis who presents to the ED for evaluation of dizziness and unsteady gait--fell twice at home right before admission.Marland KitchenMRI brain without contrast shows questionable punctate acute infarct of the dorsal right parasagittal pons along the floor of the fourth ventricle just below the level of the trigeminal nerves.  Clinical Impression  The patient presents with decreased balance at times, tendency to veer to Left. Patient does endorse " ARTHRITIS IN LEGS AND feet" > Patient ambulated in her shoes. Patient ambulated with Rw with slight improved stability, still slight left veering. Patient will benefit from  24/7  To ensure safety in her home and assist with ADL's  For safety and balance. Patient does not complain of dizziness, Reports her vision is still Off,does not explain in what regards" It's a lot better than yesterday".  patient's daughter in law present for part of evaluation.  Pt admitted with above diagnosis.  Pt currently with functional limitations due to the deficits listed below (see PT Problem List). Pt will benefit from skilled PT to increase their independence and safety with mobility to allow discharge to the venue listed below.       Follow Up Recommendations Home health PT    Equipment Recommendations  Rolling walker with 5" wheels    Recommendations for Other Services       Precautions / Restrictions Precautions Precautions: Fall Restrictions Weight Bearing Restrictions: No      Mobility  Bed Mobility Overal bed mobility: Independent             General bed mobility comments: Tends to flop back into bed--"I know I shouldn't do that"    Transfers Overall transfer level: Modified  independent Equipment used: None             General transfer comment: Takes her time, leans  forward to stand"that's how i do it."  Ambulation/Gait Ambulation/Gait assistance: Min assist;Min guard   Assistive device: Rolling walker (2 wheeled);None Gait Pattern/deviations: Step-through pattern;Drifts right/left Gait velocity: decr Gait velocity interpretation: <1.31 ft/sec, indicative of household ambulator General Gait Details: Ambulated x  50' withoput AD, left sway/veer, amb x 50' with Rw with noted still left sway tendency and RW wheels off ground on right. ambulated again x 125' W/O Rw with veering to ledft intermittently.  Stairs            Wheelchair Mobility    Modified Rankin (Stroke Patients Only)       Balance Overall balance assessment: History of Falls;Needs assistance Sitting-balance support: Feet supported;No upper extremity supported Sitting balance-Leahy Scale: Normal     Standing balance support: During functional activity;No upper extremity supported Standing balance-Leahy Scale: Fair Standing balance comment: fair stati, at times poor when not supported, tending to hold to rail in BR.               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)     Hand Dominance   Dominant Hand: Right    Extremity/Trunk Assessment   Upper Extremity Assessment Upper Extremity Assessment: Defer to OT evaluation    Lower Extremity Assessment Lower Extremity Assessment: Generalized weakness    Cervical / Trunk Assessment Cervical / Trunk Assessment: Kyphotic  Communication   Communication: No difficulties  Cognition Arousal/Alertness: Awake/alert Behavior During Therapy: WFL for tasks assessed/performed Overall Cognitive Status: Within Functional Limits for tasks assessed                                        General Comments General comments (skin integrity, edema, etc.): When having pt try to walk down the hallway and stay in the center she had a difficult time and had to decrease her speed of walking, her tendency was to go left    Exercises     Assessment/Plan    PT Assessment Patient needs continued PT services  PT Problem List Decreased strength;Decreased knowledge of use of DME;Decreased activity tolerance;Decreased balance;Decreased safety awareness;Decreased mobility       PT Treatment Interventions DME instruction;Gait training;Functional mobility training;Therapeutic activities;Therapeutic exercise;Patient/family education;Stair training;Balance training    PT Goals (Current goals can be found in the Care Plan section)  Acute Rehab PT Goals Patient Stated Goal: to eat and go home PT Goal Formulation: With patient/family Time For Goal Achievement: 07/13/20 Potential to Achieve Goals: Good    Frequency Min 3X/week   Barriers to discharge        Co-evaluation               AM-PAC PT "6 Clicks" Mobility  Outcome Measure Help needed turning from your back to your side while in a flat bed without using bedrails?: None Help needed moving from lying on your back to sitting on the side of a flat bed without using bedrails?: None Help needed moving to and  from a bed to a chair (including a wheelchair)?: A Little Help needed standing up from a chair using your arms (e.g., wheelchair or bedside chair)?: A Little Help needed to walk in hospital room?: A Little Help needed climbing 3-5 steps with a railing? : A Lot 6 Click Score: 19    End of Session Equipment Utilized During Treatment: Gait belt Activity Tolerance: Patient tolerated treatment well Patient left: in bed;with call bell/phone within reach;with nursing/sitter in room Nurse Communication: Mobility status PT Visit Diagnosis: Unsteadiness on feet (R26.81);Difficulty in walking, not elsewhere classified (R26.2);Repeated falls (R29.6)    Time: 0930-1000 PT Time Calculation (min) (ACUTE ONLY): 30 min   Charges:   PT Evaluation $PT Eval Low Complexity: 1 Low PT Treatments $Gait Training: 8-22 mins        Tresa Endo PT Acute Rehabilitation Services Pager [phone] Office [phone]  Claretha Cooper 06/29/2020, 10:22 AM

## 2020-06-29 NOTE — Progress Notes (Signed)
Pt has been admitted to the unit via [facility]. Pt has all belongings at bedside. All IV are intact. Pt denies any pain and has all call lights and telephone within reach.   06/29/20 1558  Vitals  Temp 98.7 F (37.1 C)  Temp Source Oral  BP (!) 135/57  MAP (mmHg) 80  BP Location Right Arm  BP Method Automatic  Patient Position (if appropriate) Lying  Pulse Rate 90  Pulse Rate Source Dinamap  Resp (!) 21  Level of Consciousness  Level of Consciousness Alert  MEWS COLOR  MEWS Score Color Green  Oxygen Therapy  SpO2 94 %  O2 Device Nasal Cannula  O2 Flow Rate (L/min) 2 L/min  Pain Assessment  Pain Scale 0-10  Pain Score 0  MEWS Score  MEWS Temp 0  MEWS Systolic 0  MEWS Pulse 0  MEWS RR 1  MEWS LOC 0  MEWS Score 1

## 2020-06-29 NOTE — ED Notes (Signed)
Report given to Scientist, product/process development at [facility]

## 2020-06-29 NOTE — ED Notes (Signed)
Attempted report to [facility].

## 2020-06-29 NOTE — ED Notes (Signed)
Meal tray at bedside.  

## 2020-06-29 NOTE — Progress Notes (Signed)
Patient arrived to [facility].  Hemodynamically stable.  Seen briefly at bedside.  Neurology to be made aware of her arrival by bedside nurse.  Please see hospitalist note from this morning.  [place] rounding team 6 will resume care in a.m. continue neurochecks.

## 2020-06-29 NOTE — ED Notes (Signed)
[facility] here for transport. Bedside report given. 

## 2020-06-29 NOTE — ED Notes (Signed)
Pt's husband is at bedside.

## 2020-06-29 NOTE — Progress Notes (Signed)
Echocardiogram 2D Echocardiogram has been performed.  Oneal Deputy Meron Bocchino 06/29/2020, 1:06 PM

## 2020-06-30 ENCOUNTER — Encounter (HOSPITAL_COMMUNITY): Payer: Self-pay | Admitting: Internal Medicine

## 2020-06-30 DIAGNOSIS — I639 Cerebral infarction, unspecified: Secondary | ICD-10-CM | POA: Diagnosis not present

## 2020-06-30 MED ORDER — ASPIRIN EC 81 MG PO TBEC: 81.0000 mg | DELAYED_RELEASE_TABLET | Freq: Every day | ORAL | Status: DC

## 2020-06-30 MED ORDER — ATORVASTATIN CALCIUM 80 MG PO TABS: 80.0000 mg | ORAL_TABLET | Freq: Every day | ORAL | 0 refills | Status: DC

## 2020-06-30 MED ORDER — CLOPIDOGREL BISULFATE 75 MG PO TABS
75.0000 mg | ORAL_TABLET | Freq: Every day | ORAL | 0 refills | Status: DC
Start: 2020-07-01 — End: 2020-07-28

## 2020-06-30 MED ORDER — LISINOPRIL 40 MG PO TABS
40.0000 mg | ORAL_TABLET | Freq: Every day | ORAL | Status: DC
Start: 2020-07-05 — End: 2021-03-19

## 2020-06-30 MED ORDER — ASPIRIN 81 MG PO TBEC
81.0000 mg | DELAYED_RELEASE_TABLET | Freq: Every day | ORAL | 11 refills | Status: DC
Start: 2020-07-01 — End: 2024-06-06

## 2020-06-30 NOTE — Progress Notes (Signed)
Physical Therapy Treatment Patient Details Name: Angel [name] MRN: [id number] DOB: 06/23/1931 Today's Date: 06/30/2020    History of Present Illness Angel [name] is a 86 y.o. female with medical history significant for hypertension, hyperlipidemia, presumed CAD, and osteoarthritis who presents to the ED for evaluation of dizziness and unsteady gait--fell twice at home right before admission.Marland KitchenMRI brain without contrast shows questionable punctate acute infarct of the dorsal right parasagittal pons along the floor of the fourth ventricle just below the level of the trigeminal nerves.    PT Comments    Pt with marked improvement in mobility from eval yesterday. Pt independent with bed mobility as well as sit>stand. Min-guard given for stand>sit as pt tends to sit without control. Pt ambulated 350' in hallway with min-guard A without LOB. She also ascended 5 steps with B rails and supervision. Pt reliant on rails but does have them at home. Pt reports that her visitor today can stay with her as needed when she first goes home. Given a gentle ROM program to perform in the mornings for her arthritis. Recommend HHPT to return to full independence and be safe on the large property that she owns. PT will continue to follow.    Follow Up Recommendations  Home health PT     Equipment Recommendations  None recommended by PT (pt has cane)    Recommendations for Other Services       Precautions / Restrictions Precautions Precautions: Fall Restrictions Weight Bearing Restrictions: No    Mobility  Bed Mobility Overal bed mobility: Independent             General bed mobility comments: pt able to sit straight up in bed as well as slide LE's off bed to come to edge  Transfers Overall transfer level: Needs assistance Equipment used: None Transfers: Sit to/from Stand Sit to Stand: Supervision         General transfer comment: no assist needed for sit>stand, supervision given with  stand > sit along with vc's for safety as pt tends to sit without control to toilet and chair  Ambulation/Gait Ambulation/Gait assistance: Min guard Gait Distance (Feet): 350 Feet Assistive device: None Gait Pattern/deviations: Step-through pattern Gait velocity: decr Gait velocity interpretation: <1.8 ft/sec, indicate of risk for recurrent falls General Gait Details: pt ambulated without AD without LOB and denied dizziness and visual deficits. Pt performed changes if direction without LOB. She is still moving at decreased pace and is guarded. She is also concerned about ambulating in woods on uneven ground on her property   Stairs Stairs: Yes Stairs assistance: Supervision Stair Management: Two rails;Step to pattern;Forwards Number of Stairs: 5 General stair comments: step to pattern due to R hip OA. Safe with use of rail but heavily reliant on rail. Discussed this for home and she does have rails at her steps   Wheelchair Mobility    Modified Rankin (Stroke Patients Only) Modified Rankin (Stroke Patients Only) Pre-Morbid Rankin Score: No symptoms Modified Rankin: Moderately severe disability     Balance Overall balance assessment: History of Falls;Needs assistance Sitting-balance support: Feet supported;No upper extremity supported Sitting balance-Leahy Scale: Normal     Standing balance support: During functional activity;No upper extremity supported Standing balance-Leahy Scale: Good Standing balance comment: pt able to safely maintain static balance, limitations evident with dynamic balance without support                            Cognition  Arousal/Alertness: Awake/alert Behavior During Therapy: WFL for tasks assessed/performed Overall Cognitive Status: Within Functional Limits for tasks assessed                                        Exercises      General Comments General comments (skin integrity, edema, etc.): talked through  morning ROM program as pt relays being stiff in the mornings and having a hard time getting going. Pt's boyfriend here from [place] and can stay with her as needed      Pertinent Vitals/Pain Pain Assessment: No/denies pain    Home Living                      Prior Function            PT Goals (current goals can now be found in the care plan section) Acute Rehab PT Goals Patient Stated Goal: to eat and go home PT Goal Formulation: With patient/family Time For Goal Achievement: 07/13/20 Potential to Achieve Goals: Good Progress towards PT goals: Progressing toward goals    Frequency    Min 4X/week      PT Plan Current plan remains appropriate    Co-evaluation              AM-PAC PT "6 Clicks" Mobility   Outcome Measure  Help needed turning from your back to your side while in a flat bed without using bedrails?: None Help needed moving from lying on your back to sitting on the side of a flat bed without using bedrails?: None Help needed moving to and from a bed to a chair (including a wheelchair)?: None Help needed standing up from a chair using your arms (e.g., wheelchair or bedside chair)?: A Little Help needed to walk in hospital room?: A Little Help needed climbing 3-5 steps with a railing? : A Little 6 Click Score: 21    End of Session Equipment Utilized During Treatment: Gait belt Activity Tolerance: Patient tolerated treatment well Patient left: with call bell/phone within reach;in chair;with chair alarm set;with family/visitor present Nurse Communication: Mobility status PT Visit Diagnosis: Unsteadiness on feet (R26.81);Difficulty in walking, not elsewhere classified (R26.2);Repeated falls (R29.6)     Time: 9892-1194 PT Time Calculation (min) (ACUTE ONLY): 45 min  Charges:  $Gait Training: 23-37 mins $Therapeutic Activity: 8-22 mins                     Leighton Roach, [place]  Pager [phone] Office  [place] 06/30/2020, 2:05 PM

## 2020-06-30 NOTE — Progress Notes (Signed)
SLP Cancellation Note  Patient Details Name: Angel [name] MRN: [id number] DOB: [birth year]   Cancelled treatment:       Reason Eval/Treat Not Completed: SLP screened, no needs identified, will sign off.    Caven Perine, Katherene Ponto 06/30/2020, 2:42 PM

## 2020-06-30 NOTE — Progress Notes (Signed)
STROKE TEAM PROGRESS NOTE   INTERVAL HISTORY I personally reviewed history of presenting illness, electronic medical records and imaging films PACS.  Patient is doing fine this morning.  She is sitting up in bed.  She has no complaints.  MRI scan shows tiny punctate left pontine lacunar infarct.  Echocardiogram is unremarkable.  LDL cholesterol is 93 mg percent.  Hemoglobin A1c 6.2.  Vital signs are stable.  Vitals:   06/29/20 2040 06/29/20 2330 06/30/20 0331 06/30/20 0804  BP: 134/63 (!) 148/74 (!) 144/79 (!) 162/79  Pulse: 76 90 95 95  Resp: 17 18 18 19   Temp: 98.2 F (36.8 C) 98.1 F (36.7 C) 98 F (36.7 C) 97.7 F (36.5 C)  TempSrc: Oral Oral Oral Oral  SpO2: 93% 94% 93% 92%   CBC:  Recent Labs  Lab 06/28/20 1635 06/28/20 1635 06/28/20 1647 06/29/20 0500  WBC 8.1  --   --  6.2  NEUTROABS 5.9  --   --   --   HGB 13.9   < > 13.6 12.0  HCT 42.7   < > 40.0 36.9  MCV 99.3  --   --  99.7  PLT 270  --   --  226   < > = values in this interval not displayed.   Basic Metabolic Panel:  Recent Labs  Lab 06/28/20 1613 06/28/20 1613 06/28/20 1647 06/29/20 0500  NA 143   < > 144 140  K 4.1   < > 3.8 3.8  CL 108   < > 107 105  CO2 25  --   --  24  GLUCOSE 107*   < > 93 113*  BUN 29*   < > 30* 24*  CREATININE 0.71   < > 0.60 0.73  CALCIUM 9.4  --   --  8.5*   < > = values in this interval not displayed.   Lipid Panel:  Recent Labs  Lab 06/29/20 0500  CHOL 160  TRIG 133  HDL 40*  CHOLHDL 4.0  VLDL 27  LDLCALC 93   HgbA1c:  Recent Labs  Lab 06/29/20 0500  HGBA1C 6.2*   Urine Drug Screen:  Recent Labs  Lab 06/28/20 1620  LABOPIA NONE DETECTED  COCAINSCRNUR NONE DETECTED  LABBENZ NONE DETECTED  AMPHETMU NONE DETECTED  THCU NONE DETECTED  LABBARB NONE DETECTED    Alcohol Level  Recent Labs  Lab 06/28/20 1635  ETH <10    IMAGING past 24 hours ECHOCARDIOGRAM COMPLETE  Result Date: 06/29/2020    ECHOCARDIOGRAM REPORT   Patient Name:   Angel [name] Date of Exam: 06/29/2020 Medical Rec #:  [id number]       Height:       64.0 in Accession #:    [id number]      Weight:       194.8 lb Date of Birth:  January 22, 1931       BSA:          1.935 m Patient Age:    86 years        BP:           137/73 mmHg Patient Gender: F               HR:           93 bpm. Exam Location:  Inpatient Procedure: 2D Echo, Color Doppler and Cardiac Doppler Indications:    Stroke i163.9  History:        Patient has prior history  of Echocardiogram examinations, most                 recent 02/04/2019. CAD; Risk Factors:Hypertension and                 Dyslipidemia.  Sonographer:    Raquel Sarna Senior RDCS Referring Phys: [id number] [place]  Sonographer Comments: Technically difficult study due to poor echo windows. Apical window too off axis for wall motion, obtained wall mation from short axis. IMPRESSIONS  1. Left ventricular ejection fraction, by estimation, is 60 to 65%. The left ventricle has normal function. The left ventricle has no regional wall motion abnormalities. There is mild left ventricular hypertrophy. Left ventricular diastolic parameters are consistent with Grade I diastolic dysfunction (impaired relaxation).  2. Right ventricular systolic function is normal. The right ventricular size is normal.  3. Left atrial size was moderately dilated.  4. The mitral valve is grossly normal. Trivial mitral valve regurgitation.  5. The aortic valve was not well visualized. Aortic valve regurgitation is not visualized. Moderate aortic valve stenosis. Aortic valve area, by VTI measures 1.20 cm. Aortic valve mean gradient measures 14.0 mmHg. Aortic valve Vmax measures 2.48 m/s. DI is 0.35.  6. Aortic dilatation noted. There is mild dilatation of the ascending aorta, measuring 38 mm.  7. The inferior vena cava is normal in size with <50% respiratory variability, suggesting right atrial pressure of 8 mmHg. Comparison(s): Prior images reviewed side by side. Changes from prior study are noted.  02/04/2019: LVEF 60-65%, mild AS - mean gradient 13 mmHg, aorta measures 38 mm. FINDINGS  Left Ventricle: Left ventricular ejection fraction, by estimation, is 60 to 65%. The left ventricle has normal function. The left ventricle has no regional wall motion abnormalities. The left ventricular internal cavity size was normal in size. There is  mild left ventricular hypertrophy. Left ventricular diastolic parameters are consistent with Grade I diastolic dysfunction (impaired relaxation). Indeterminate filling pressures. Right Ventricle: The right ventricular size is normal. No increase in right ventricular wall thickness. Right ventricular systolic function is normal. Left Atrium: Left atrial size was moderately dilated. Right Atrium: Right atrial size was normal in size. Pericardium: There is no evidence of pericardial effusion. Mitral Valve: The mitral valve is grossly normal. Trivial mitral valve regurgitation. Tricuspid Valve: The tricuspid valve is grossly normal. Tricuspid valve regurgitation is trivial. Aortic Valve: The aortic valve was not well visualized. There is moderate calcification of the aortic valve. Aortic valve regurgitation is not visualized. Moderate aortic stenosis is present. Aortic valve mean gradient measures 14.0 mmHg. Aortic valve peak gradient measures 24.6 mmHg. Aortic valve area, by VTI measures 1.20 cm. Pulmonic Valve: The pulmonic valve was normal in structure. Pulmonic valve regurgitation is not visualized. Aorta: Aortic dilatation noted. There is mild dilatation of the ascending aorta, measuring 38 mm. Venous: The inferior vena cava is normal in size with less than 50% respiratory variability, suggesting right atrial pressure of 8 mmHg. IAS/Shunts: No atrial level shunt detected by color flow Doppler.  LEFT VENTRICLE PLAX 2D LVIDd:         2.90 cm  Diastology LVIDs:         1.90 cm  LV e' medial:    4.13 cm/s LV PW:         1.50 cm  LV E/e' medial:  10.9 LV IVS:        1.10 cm  LV e'  lateral:   6.96 cm/s LVOT diam:     2.10 cm  LV E/e' lateral: 6.5 LV SV:         59 LV SV Index:   30 LVOT Area:     3.46 cm  RIGHT VENTRICLE RV S prime:     13.90 cm/s TAPSE (M-mode): 1.7 cm LEFT ATRIUM             Index       RIGHT ATRIUM           Index LA diam:        3.50 cm 1.81 cm/m  RA Area:     17.10 cm LA Vol (A2C):   53.7 ml 27.76 ml/m RA Volume:   43.20 ml  22.33 ml/m LA Vol (A4C):   84.2 ml 43.52 ml/m LA Biplane Vol: 71.8 ml 37.11 ml/m  AORTIC VALVE AV Area (Vmax):    1.14 cm AV Area (Vmean):   1.24 cm AV Area (VTI):     1.20 cm AV Vmax:           248.00 cm/s AV Vmean:          172.000 cm/s AV VTI:            0.491 m AV Peak Grad:      24.6 mmHg AV Mean Grad:      14.0 mmHg LVOT Vmax:         81.90 cm/s LVOT Vmean:        61.750 cm/s LVOT VTI:          0.170 m LVOT/AV VTI ratio: 0.35  AORTA Ao Root diam: 3.10 cm Ao Asc diam:  3.80 cm MITRAL VALVE MV Area (PHT): 3.31 cm    SHUNTS MV Decel Time: 229 msec    Systemic VTI:  0.17 m MV E velocity: 45.00 cm/s  Systemic Diam: 2.10 cm MV A velocity: 87.80 cm/s MV E/A ratio:  0.51 Angel Bishop MD Electronically signed by Angel Bishop MD Signature Date/Time: 06/29/2020/2:13:16 PM    Final     PHYSICAL EXAM Pleasant elderly Caucasian lady not in distress. . Afebrile. Head is nontraumatic. Neck is supple without bruit.    Cardiac exam no murmur or gallop. Lungs are clear to auscultation. Distal pulses are well felt. Neurological Exam ;  Awake  Alert oriented x 3. Normal speech and language.eye movements full without nystagmus.fundi were not visualized. Vision acuity and fields appear normal. Hearing is normal. Palatal movements are normal. Face symmetric. Tongue midline. Normal strength, tone, reflexes and coordination. Normal sensation. Gait deferred.  ASSESSMENT/PLAN Angel [name] is a 86 y.o. female with history of HTN, HLD, CAD presenting with unsteady gait w/ dizziness.    Stroke:   Small R pontine infarct secondary to small  vessel disease  CT head No acute abnormality. Small vessel disease. Atrophy.   MRI  ? Dorsal R parasagittal pontine infarct. Small vessel disease.   CTA head & neck no LVO. R ICA w/ 50% stenosis. L ICA atherosclerosis. 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  2D Echo EF 60-65%. No source of embolus. LA moderately dilated  LDL 93  HgbA1c 6.2  VTE prophylaxis - Lovenox 40 mg sq daily   No antithrombotic prior to admission, now on aspirin 325 mg daily and clopidogrel 75 mg daily following plavix load. Will decrease aspirin to 81 and continue DAPT x 3 weeks then aspirin alone  Therapy recommendations:  HH PT  Disposition:  Return home w/ HH  Hypertension  Stable .  Permissive hypertension (OK if < 220/120) but gradually normalize in 5-7 days . Long-term BP goal normotensive  Hyperlipidemia  Home meds:  lipitor 40, fish oild  Now on lipitor 80  LDL 93, goal < 70  Continue statin at discharge  Other Stroke Risk Factors  Advanced Age >/= 6   ETOH use, alcohol level <10, advised to drink no more than 1 drink(s) a day  Obesity, There is no height or weight on file to calculate BMI., BMI >/= 30 associated with increased stroke risk, recommend weight loss, diet and exercise as appropriate   Family hx stroke (mother)  Coronary artery disease  Other Active Problems  AAA  Hospital day # 1  She presented with sudden onset of unsteadiness due to small paramedian right pontine lacunar infarct likely from small vessel disease.  Recommend aspirin Plavix for 3 weeks and then aspirin alone.  Continue ongoing stroke work-up and aggressive risk factor modification.  Discussed with Dr. Alfonse Spruce.  Follow-up as an outpatient stroke clinic in 6 weeks.  Greater than 50% time during this 25-minute visit was spent in counseling and coordination of care about her lacunar stroke and answering questions. Antony Contras, MD To contact Stroke Continuity  provider, please refer to [email or web address] After hours, contact General Neurology

## 2020-06-30 NOTE — Discharge Summary (Signed)
Physician Discharge Summary  Angel [name] XTG:[id number] DOB: 1930-10-04 DOA: 06/28/2020  PCP: Delsa Grana, PA-C  Admit date: 06/28/2020 Discharge date: 06/30/2020  Admitted From: home Discharge disposition: home   Recommendations for Outpatient Follow-Up:   1. Home health 2. ASA Plus plavix x 3 weeks then ASA alone   Discharge Diagnosis:   Principal Problem:   Acute CVA (cerebrovascular accident) ([place]) Active Problems:   Hypertension goal BP (blood pressure) < 140/90   Hyperlipidemia LDL goal <70   Asymptomatic bacteriuria   Chest pain    Discharge Condition: Improved.  Diet recommendation: Low sodium, heart healthy  Wound care: None.  Code status: Full.   History of Present Illness:   Angel [name] is a 86 y.o. female with medical history significant for hypertension, hyperlipidemia, presumed CAD, and osteoarthritis who presents to the ED for evaluation of dizziness and unsteady gait.  Patient states she was in her usual state of health whenever she went to bed night of 11/20.  She woke up at 7 AM today (06/28/2020) and noticed new onset of dizziness and gait imbalance.  Dizziness is described as a room spinning sensation and occurred when she attempted to walk.  She says the dizziness would then resolve after resting.  She felt as if her balance was off and that her left leg movement was uncoordinated.  She says she did fall twice onto the carpet at home due to her dizziness/imbalance.  She denied any significant injury or hitting her head.  She did not lose consciousness.  She did not have any weakness in her upper or lower extremities.  She did not notice any new change in sensation.  She also noticed mid central sternal chest pressure that occurred alongside her dizziness.  Chest pressure did not radiate to her arms, neck, jaw, or back.  She did not have associated diaphoresis, nausea, vomiting, dyspnea, dysuria, or abdominal pain.  She says the  chest pressure has completely resolved after receiving sublingual nitroglycerin and oral Ativan.  She says she does have similar symptoms on occasion which she says can be caused by emotional stress which she has been experiencing recently.    Hospital Course by Problem:   Stroke:   Small R pontine infarct secondary to small vessel disease  CT head No acute abnormality. Small vessel disease. Atrophy.  -MRI  ? Dorsal R parasagittal pontine infarct. Small vessel disease.  -CTA head & neck no LVO. R ICA w/ 50% stenosis. L ICA atherosclerosis. 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 -2D Echo EF 60-65%. No source of embolus. LA moderately dilated -LDL 93 -HgbA1c 6.2 - aspirin to 81 and continue DAPT x 3 weeks then aspirin alone   Hypertension -Stable -Permissive hypertension (OK if < 220/120) but gradually normalize in 5-7 days   Hyperlipidemia - on lipitor 80 - LDL 93, goal < 70     Medical Consultants:   neurology   Discharge Exam:   Vitals:   06/30/20 0804 06/30/20 1217  BP: (!) 162/79 126/71  Pulse: 95 90  Resp: 19 15  Temp: 97.7 F (36.5 C) 98 F (36.7 C)  SpO2: 92% 92%   Vitals:   06/29/20 2330 06/30/20 0331 06/30/20 0804 06/30/20 1217  BP: (!) 148/74 (!) 144/79 (!) 162/79 126/71  Pulse: 90 95 95 90  Resp: 18 18 19 15   Temp: 98.1 F (36.7 C) 98 F (36.7 C) 97.7 F (36.5 C)  98 F (36.7 C)  TempSrc: Oral Oral Oral Oral  SpO2: 94% 93% 92% 92%    General exam: Appears calm and comfortable.    The results of significant diagnostics from this hospitalization (including imaging, microbiology, ancillary and laboratory) are listed below for reference.     Procedures and Diagnostic Studies:   CT Angio Head W or Wo Contrast  Result Date: 06/28/2020 CLINICAL DATA:  Neuro deficit, acute. Stroke suspected. EXAM: CT ANGIOGRAPHY HEAD AND NECK TECHNIQUE: Multidetector CT imaging of the head and neck was performed using the  standard protocol during bolus administration of intravenous contrast. Multiplanar CT image reconstructions and MIPs were obtained to evaluate the vascular anatomy. Carotid stenosis measurements (when applicable) are obtained utilizing NASCET criteria, using the distal internal carotid diameter as the denominator. CONTRAST:  159mL OMNIPAQUE IOHEXOL 350 MG/ML SOLN COMPARISON:  None. FINDINGS: CTA NECK FINDINGS Aortic arch: 4 vessel aortic arch with the left vertebral artery originating directly from the arch. Imaged portion shows no evidence of aneurysm or dissection. Atherosclerotic changes with calcified plaques with severe stenosis at the origin of the left vertebral artery. Right carotid system: Atherosclerotic changes of the right carotid bifurcation with mixed density plaque results approximately 50% stenosis. Left carotid system: Atherosclerotic changes of the left carotid bifurcation without hemodynamically significant stenosis. Vertebral arteries: The right vertebral artery is dominant noting atherosclerotic changes at its origin from the subclavian artery without hemodynamically significant stenosis. Normal course and caliber of the remainder of the right vertebral artery. The left vertebral artery originates directly from the aortic arch noting atherosclerotic changes with calcified plaque at its origin with severe stenosis. The left vertebral artery has diminutive caliber throughout its entire course. Skeleton: Degenerative changes of the cervical spine. No aggressive bone lesion identified. Other neck: Negative Upper chest: Biapical fibrosis. Review of the MIP images confirms the above findings CTA HEAD FINDINGS Anterior circulation: No significant stenosis, proximal occlusion, aneurysm, or vascular malformation. Posterior circulation: Diminutive left vertebral artery. Diminutive caliber of the proximal and mid basilar artery likely related to the presence of a right persistent trigeminal artery and  bilateral fetal PCAs. No aneurysm or vascular malformation. Venous sinuses: As permitted by contrast timing, patent. Anatomic variants: Bilateral fetal PCA. Right persistent trigeminal artery. Review of the MIP images confirms the above findings IMPRESSION: 1. No large vessel occlusion or high-grade stenosis of the intracranial vasculature. 2. Atherosclerotic changes of the right carotid bifurcation with approximately 50% stenosis. 3. Atherosclerotic changes of the left carotid bifurcation without hemodynamically significant stenosis. 4. 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 Aortic Atherosclerosis (ICD10-I70.0). Electronically Signed   By: Pedro Earls M.D.   On: 06/28/2020 23:13   CT HEAD WO CONTRAST  Result Date: 06/28/2020 CLINICAL DATA:  Dizziness EXAM: CT HEAD WITHOUT CONTRAST TECHNIQUE: Contiguous axial images were obtained from the base of the skull through the vertex without intravenous contrast. COMPARISON:  None. FINDINGS: Brain: There is atrophy and chronic small vessel disease changes. No acute intracranial abnormality. Specifically, no hemorrhage, hydrocephalus, mass lesion, acute infarction, or significant intracranial injury. Vascular: No hyperdense vessel or unexpected calcification. Skull: No acute calvarial abnormality. Sinuses/Orbits: Visualized paranasal sinuses and mastoids clear. Orbital soft tissues unremarkable. Other: None IMPRESSION: Atrophy, chronic microvascular disease. No acute intracranial abnormality. Electronically Signed   By: Rolm Baptise M.D.   On: 06/28/2020 16:52   CT Angio Neck W and/or Wo Contrast  Result Date: 06/28/2020 CLINICAL DATA:  Neuro deficit, acute. Stroke suspected. EXAM: CT ANGIOGRAPHY HEAD AND NECK TECHNIQUE: Multidetector CT imaging of the head and neck was performed using the standard protocol during bolus administration  of intravenous contrast. Multiplanar CT image reconstructions and MIPs were obtained to evaluate the vascular anatomy. Carotid stenosis measurements (when applicable) are obtained utilizing NASCET criteria, using the distal internal carotid diameter as the denominator. CONTRAST:  140mL OMNIPAQUE IOHEXOL 350 MG/ML SOLN COMPARISON:  None. FINDINGS: CTA NECK FINDINGS Aortic arch: 4 vessel aortic arch with the left vertebral artery originating directly from the arch. Imaged portion shows no evidence of aneurysm or dissection. Atherosclerotic changes with calcified plaques with severe stenosis at the origin of the left vertebral artery. Right carotid system: Atherosclerotic changes of the right carotid bifurcation with mixed density plaque results approximately 50% stenosis. Left carotid system: Atherosclerotic changes of the left carotid bifurcation without hemodynamically significant stenosis. Vertebral arteries: The right vertebral artery is dominant noting atherosclerotic changes at its origin from the subclavian artery without hemodynamically significant stenosis. Normal course and caliber of the remainder of the right vertebral artery. The left vertebral artery originates directly from the aortic arch noting atherosclerotic changes with calcified plaque at its origin with severe stenosis. The left vertebral artery has diminutive caliber throughout its entire course. Skeleton: Degenerative changes of the cervical spine. No aggressive bone lesion identified. Other neck: Negative Upper chest: Biapical fibrosis. Review of the MIP images confirms the above findings CTA HEAD FINDINGS Anterior circulation: No significant stenosis, proximal occlusion, aneurysm, or vascular malformation. Posterior circulation: Diminutive left vertebral artery. 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 No aneurysm or vascular  malformation. Venous sinuses: As permitted by contrast timing, patent. Anatomic variants: Bilateral fetal PCA. Right persistent trigeminal artery. Review of the MIP images confirms the above findings IMPRESSION: 1. No large vessel occlusion or high-grade stenosis of the intracranial vasculature. 2. Atherosclerotic changes of the right carotid bifurcation with approximately 50% stenosis. 3. Atherosclerotic changes of the left carotid bifurcation without hemodynamically significant stenosis. 4. 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 Aortic Atherosclerosis (ICD10-I70.0). Electronically Signed   By: Pedro Earls M.D.   On: 06/28/2020 23:13   MR BRAIN WO CONTRAST  Result Date: 06/28/2020 CLINICAL DATA:  Dizziness EXAM: MRI HEAD WITHOUT CONTRAST TECHNIQUE: Multiplanar, multiecho pulse sequences of the brain and surrounding structures were obtained without intravenous contrast. COMPARISON:  None. FINDINGS: Brain: Questionable small focus of mildly reduced diffusion along the right parasagittal dorsal pons/floor of the fourth ventricle just below the level of the trigeminal nerves (series 5, image 63). Punctate focus of susceptibility hypointensity in the central right pons is compatible with chronic microhemorrhage or mineralization. There is no intracranial mass or mass effect. There is no hydrocephalus or extra-axial fluid collection. Prominence of the ventricles and sulci reflects generalized parenchymal volume loss. Patchy and confluent areas of T2 hyperintensity in the supratentorial greater than pontine white matter are nonspecific but may reflect moderate chronic microvascular ischemic changes. Vascular: Major vessel flow voids at the skull base are preserved. Skull and upper cervical spine: Normal marrow signal is preserved. Sinuses/Orbits: Paranasal sinuses are aerated.  Bilateral lens replacements. Other: Sella is unremarkable.  Mastoid air cells are clear. IMPRESSION: Questionable punctate acute infarct of the dorsal right parasagittal pons  along the floor of the fourth ventricle just below the level of the trigeminal nerves. This could be artifactual. Moderate chronic microvascular ischemic changes. Electronically Signed   By: Macy Mis M.D.   On: 06/28/2020 19:40   DG Chest Portable 1 View  Result Date: 06/28/2020 CLINICAL DATA:  Chest pain. EXAM: PORTABLE CHEST 1 VIEW COMPARISON:  May 04, 2006 FINDINGS: No pneumothorax. The cardiomediastinal silhouette is stable. The lungs are clear. IMPRESSION: No active disease. Electronically Signed   By: Dorise Bullion III M.D   On: 06/28/2020 16:50   ECHOCARDIOGRAM COMPLETE  Result Date: 06/29/2020    ECHOCARDIOGRAM REPORT   Patient Name:   Angel [name] Date of Exam: 06/29/2020 Medical Rec #:  [id number]       Height:       64.0 in Accession #:    [id number]      Weight:       194.8 lb Date of Birth:  [birth year]       BSA:          1.935 m Patient Age:    45 years        BP:           137/73 mmHg Patient Gender: F               HR:           93 bpm. Exam Location:  Inpatient Procedure: 2D Echo, Color Doppler and Cardiac Doppler Indications:    Stroke i163.9  History:        Patient has prior history of Echocardiogram examinations, most                 recent 02/04/2019. CAD; Risk Factors:Hypertension and                 Dyslipidemia.  Sonographer:    Raquel Sarna Senior RDCS Referring Phys: [id number] [place]  Sonographer Comments: Technically difficult study due to poor echo windows. Apical window too off axis for wall motion, obtained wall mation from short axis. IMPRESSIONS  1. Left ventricular ejection fraction, by estimation, is 60 to 65%. The left ventricle has normal function. The left ventricle has no regional wall motion abnormalities. There is mild left ventricular hypertrophy. Left ventricular diastolic  parameters are consistent with Grade I diastolic dysfunction (impaired relaxation).  2. Right ventricular systolic function is normal. The right ventricular size is normal.  3. Left atrial size was moderately dilated.  4. The mitral valve is grossly normal. Trivial mitral valve regurgitation.  5. The aortic valve was not well visualized. Aortic valve regurgitation is not visualized. Moderate aortic valve stenosis. Aortic valve area, by VTI measures 1.20 cm. Aortic valve mean gradient measures 14.0 mmHg. Aortic valve Vmax measures 2.48 m/s. DI is 0.35.  6. Aortic dilatation noted. There is mild dilatation of the ascending aorta, measuring 38 mm.  7. The inferior vena cava is normal in size with <50% respiratory variability, suggesting right atrial pressure of 8 mmHg. Comparison(s): Prior images reviewed side by side. Changes from prior study are noted. 02/04/2019: LVEF 60-65%, mild AS - mean gradient 13 mmHg, aorta measures 38 mm. FINDINGS  Left Ventricle: Left ventricular ejection fraction, by estimation, is 60 to 65%. The left ventricle has normal function. The left ventricle has no regional wall motion abnormalities. The left ventricular internal cavity size was normal in size. There is  mild left ventricular hypertrophy. Left ventricular diastolic parameters are consistent with Grade I diastolic dysfunction (  impaired relaxation). Indeterminate filling pressures. Right Ventricle: The right ventricular size is normal. No increase in right ventricular wall thickness. Right ventricular systolic function is normal. Left Atrium: Left atrial size was moderately dilated. Right Atrium: Right atrial size was normal in size. Pericardium: There is no evidence of pericardial effusion. Mitral Valve: The mitral valve is grossly normal. Trivial mitral valve regurgitation. Tricuspid Valve: The tricuspid valve is grossly normal. Tricuspid valve regurgitation is trivial. Aortic Valve: The aortic valve was not well visualized. There  is moderate calcification of the aortic valve. Aortic valve regurgitation is not visualized. Moderate aortic stenosis is present. Aortic valve mean gradient measures 14.0 mmHg. Aortic valve peak gradient measures 24.6 mmHg. Aortic valve area, by VTI measures 1.20 cm. Pulmonic Valve: The pulmonic valve was normal in structure. Pulmonic valve regurgitation is not visualized. Aorta: Aortic dilatation noted. There is mild dilatation of the ascending aorta, measuring 38 mm. Venous: The inferior vena cava is normal in size with less than 50% respiratory variability, suggesting right atrial pressure of 8 mmHg. IAS/Shunts: No atrial level shunt detected by color flow Doppler.  LEFT VENTRICLE PLAX 2D LVIDd:         2.90 cm  Diastology LVIDs:         1.90 cm  LV e' medial:    4.13 cm/s LV PW:         1.50 cm  LV E/e' medial:  10.9 LV IVS:        1.10 cm  LV e' lateral:   6.96 cm/s LVOT diam:     2.10 cm  LV E/e' lateral: 6.5 LV SV:         59 LV SV Index:   30 LVOT Area:     3.46 cm  RIGHT VENTRICLE RV S prime:     13.90 cm/s TAPSE (M-mode): 1.7 cm LEFT ATRIUM             Index       RIGHT ATRIUM           Index LA diam:        3.50 cm 1.81 cm/m  RA Area:     17.10 cm LA Vol (A2C):   53.7 ml 27.76 ml/m RA Volume:   43.20 ml  22.33 ml/m LA Vol (A4C):   84.2 ml 43.52 ml/m LA Biplane Vol: 71.8 ml 37.11 ml/m  AORTIC VALVE AV Area (Vmax):    1.14 cm AV Area (Vmean):   1.24 cm AV Area (VTI):     1.20 cm AV Vmax:           248.00 cm/s AV Vmean:          172.000 cm/s AV VTI:            0.491 m AV Peak Grad:      24.6 mmHg AV Mean Grad:      14.0 mmHg LVOT Vmax:         81.90 cm/s LVOT Vmean:        61.750 cm/s LVOT VTI:          0.170 m LVOT/AV VTI ratio: 0.35  AORTA Ao Root diam: 3.10 cm Ao Asc diam:  3.80 cm MITRAL VALVE MV Area (PHT): 3.31 cm    SHUNTS MV Decel Time: 229 msec    Systemic VTI:  0.17 m MV E velocity: 45.00 cm/s  Systemic Diam: 2.10 cm MV A velocity: 87.80 cm/s MV E/A ratio:  0.51 Lyman Bishop MD  Electronically signed  by Lyman Bishop MD Signature Date/Time: 06/29/2020/2:13:16 PM    Final      Labs:   Basic Metabolic Panel: Recent Labs  Lab 06/28/20 1613 06/28/20 1613 06/28/20 1647 06/29/20 0500  NA 143  --  144 140  K 4.1   < > 3.8 3.8  CL 108  --  107 105  CO2 25  --   --  24  GLUCOSE 107*  --  93 113*  BUN 29*  --  30* 24*  CREATININE 0.71  --  0.60 0.73  CALCIUM 9.4  --   --  8.5*   < > = values in this interval not displayed.   GFR CrCl cannot be calculated (Unknown ideal weight.). Liver Function Tests: Recent Labs  Lab 06/28/20 1613  AST 26  ALT 28  ALKPHOS 48  BILITOT 0.7  PROT 7.5  ALBUMIN 4.0   No results for input(s): LIPASE, AMYLASE in the last 168 hours. No results for input(s): AMMONIA in the last 168 hours. Coagulation profile Recent Labs  Lab 06/28/20 1635  INR 1.0    CBC: Recent Labs  Lab 06/28/20 1635 06/28/20 1647 06/29/20 0500  WBC 8.1  --  6.2  NEUTROABS 5.9  --   --   HGB 13.9 13.6 12.0  HCT 42.7 40.0 36.9  MCV 99.3  --  99.7  PLT 270  --  226   Cardiac Enzymes: No results for input(s): CKTOTAL, CKMB, CKMBINDEX, TROPONINI in the last 168 hours. BNP: Invalid input(s): POCBNP CBG: Recent Labs  Lab 06/28/20 1618  GLUCAP 98   D-Dimer No results for input(s): DDIMER in the last 72 hours. Hgb A1c Recent Labs    06/29/20 0500  HGBA1C 6.2*   Lipid Profile Recent Labs    06/29/20 0500  CHOL 160  HDL 40*  LDLCALC 93  TRIG 133  CHOLHDL 4.0   Thyroid function studies No results for input(s): TSH, T4TOTAL, T3FREE, THYROIDAB in the last 72 hours.  Invalid input(s): FREET3 Anemia work up No results for input(s): VITAMINB12, FOLATE, FERRITIN, TIBC, IRON, RETICCTPCT in the last 72 hours. Microbiology Recent Results (from the past 240 hour(s))  Resp Panel by RT-PCR (Flu A&B, Covid) Nasopharyngeal Swab     Status: None   Collection Time: 06/28/20  9:37 PM   Specimen: Nasopharyngeal Swab; Nasopharyngeal(NP) swabs  in vial transport medium  Result Value Ref Range Status   SARS Coronavirus 2 by RT PCR NEGATIVE NEGATIVE Final    Comment: (NOTE) SARS-CoV-2 target nucleic acids are NOT DETECTED.  The SARS-CoV-2 RNA is generally detectable in upper respiratory specimens during the acute phase of infection. The lowest concentration of SARS-CoV-2 viral copies this assay can detect is 138 copies/mL. A negative result does not preclude SARS-Cov-2 infection and should not be used as the sole basis for treatment or other patient management decisions. A negative result may occur with  improper specimen collection/handling, submission of specimen other than nasopharyngeal swab, presence of viral mutation(s) within the areas targeted by this assay, and inadequate number of viral copies(<138 copies/mL). A negative result must be combined with clinical observations, patient history, and epidemiological information. The expected result is Negative.  Fact Sheet for Patients:  EntrepreneurPulse.com.au  Fact Sheet for Healthcare Providers:  IncredibleEmployment.be  This test is no t yet approved or cleared by the [place] FDA and  has been authorized for detection and/or diagnosis of SARS-CoV-2 by FDA under an Emergency Use Authorization (EUA). This EUA will remain  in effect (  meaning this test can be used) for the duration of the COVID-19 declaration under Section 564(b)(1) of the Act, 21 U.S.C.section 360bbb-3(b)(1), unless the authorization is terminated  or revoked sooner.       Influenza A by PCR NEGATIVE NEGATIVE Final   Influenza B by PCR NEGATIVE NEGATIVE Final    Comment: (NOTE) The Xpert Xpress SARS-CoV-2/FLU/RSV plus assay is intended as an aid in the diagnosis of influenza from Nasopharyngeal swab specimens and should not be used as a sole basis for treatment. Nasal washings and aspirates are unacceptable for Xpert Xpress  SARS-CoV-2/FLU/RSV testing.  Fact Sheet for Patients: EntrepreneurPulse.com.au  Fact Sheet for Healthcare Providers: IncredibleEmployment.be  This test is not yet approved or cleared by the [place] FDA and has been authorized for detection and/or diagnosis of SARS-CoV-2 by FDA under an Emergency Use Authorization (EUA). This EUA will remain in effect (meaning this test can be used) for the duration of the COVID-19 declaration under Section 564(b)(1) of the Act, 21 U.S.C. section 360bbb-3(b)(1), unless the authorization is terminated or revoked.  Performed at [facility], [place] [address] [place], [place] [address]      Discharge Instructions:   Discharge Instructions    Ambulatory referral to Neurology   Complete by: As directed    Follow up in stroke clinic at [facility] with Frann Rider, NP in about 4 weeks. If not available, consider Dr. Antony Contras, Dr. Bess Harvest, or Dr. Sarina Ill.   Diet - low sodium heart healthy   Complete by: As directed    Discharge instructions   Complete by: As directed    Asa plus plavix x 3 weeks then ASA alone Goal LDL: <70 (yours was in the 90s) so statin increased   Increase activity slowly   Complete by: As directed      Allergies as of 06/30/2020      Reactions   Codeine Other (See Comments)   "zoned out"   Levofloxacin Other (See Comments)   AMS   Myrbetriq [mirabegron] Other (See Comments)   Severe depression   Prednisone Other (See Comments)   AMS   Sulfa Antibiotics Other (See Comments)   AMS      Medication List    STOP taking these medications   amLODipine 5 MG tablet Commonly known as: NORVASC   ibuprofen 200 MG tablet Commonly known as: ADVIL     TAKE these medications   aspirin 81 MG EC tablet Take 1 tablet (81 mg total) by mouth daily. Swallow whole. Start taking on: July 01, 2020   atorvastatin 80 MG  tablet Commonly known as: Lipitor Take 1 tablet (80 mg total) by mouth daily. What changed:   medication strength  how much to take  when to take this   clopidogrel 75 MG tablet Commonly known as: PLAVIX Take 1 tablet (75 mg total) by mouth daily. Start taking on: July 01, 2020   Fish Oil 1000 MG Caps Take by mouth. Krill oil   glucosamine-chondroitin 500-400 MG tablet Take 1 tablet by mouth daily.   lisinopril 40 MG tablet Commonly known as: ZESTRIL Take 1 tablet (40 mg total) by mouth daily. Start taking on: July 05, 2020 What changed: These instructions start on July 05, 2020. If you are unsure what to do until then, ask your doctor or other care provider.   metoprolol succinate 50 MG 24 hr tablet Commonly known as: TOPROL-XL TAKE 1 & 1/2 TABLET BY MOUTH DAILY TAKE WITH  OR IMMEDIATELY FOLLOWING A MEAL What changed: See the new instructions.   multivitamin capsule Take 1 capsule by mouth daily.   nitroGLYCERIN 0.4 MG SL tablet Commonly known as: NITROSTAT Place 1 tablet (0.4 mg total) under the tongue every 5 (five) minutes as needed for chest pain.   vitamin B-12 100 MCG tablet Commonly known as: CYANOCOBALAMIN Take 1,000 mcg by mouth daily.   vitamin C 100 MG tablet Take 500 mg by mouth daily. Gummy 750mg    VITAMIN D (ERGOCALCIFEROL) PO Take 1,000 Units by mouth daily.            Durable Medical Equipment  (From admission, onward)         Start     Ordered   06/30/20 1136  For home use only DME Walker rolling  Once       Question Answer Comment  Walker: With [place]   Patient needs a walker to treat with the following condition Stroke [facility])      06/30/20 1135          Follow-up Information    [facility] [place] Follow up.   Specialty: Home Health Services Why: The home health agency will contact you for the first home visit. Contact information: [place] [address] [phone]         [facility] Follow up in 4 week(s).   Specialty: Neurology Why: stroke clinic. office will call with appt date and time.  Contact information: [place] [phone]       Delsa Grana, PA-C Follow up in 1 week(s).   Specialty: Family Medicine Contact information: [place] [address] [phone]        Belva Crome, MD .   Specialty: Cardiology Contact information: [phone] [place] [address] [phone]                Time coordinating discharge: 35 min  Signed:  Geradine Girt DO  [facility] 06/30/2020, 3:44 PM

## 2020-06-30 NOTE — Plan of Care (Signed)
  Problem: Education: Goal: Knowledge of General Education information will improve Description: Including pain rating scale, medication(s)/side effects and non-pharmacologic comfort measures Outcome: Adequate for Discharge   Problem: Health Behavior/Discharge Planning: Goal: Ability to manage health-related needs will improve Outcome: Adequate for Discharge   Problem: Clinical Measurements: Goal: Ability to maintain clinical measurements within normal limits will improve Outcome: Adequate for Discharge Goal: Will remain free from infection Outcome: Adequate for Discharge Goal: Diagnostic test results will improve Outcome: Adequate for Discharge Goal: Respiratory complications will improve Outcome: Adequate for Discharge Goal: Cardiovascular complication will be avoided Outcome: Adequate for Discharge   Problem: Activity: Goal: Risk for activity intolerance will decrease Outcome: Adequate for Discharge   Problem: Nutrition: Goal: Adequate nutrition will be maintained Outcome: Adequate for Discharge   Problem: Coping: Goal: Level of anxiety will decrease Outcome: Adequate for Discharge   Problem: Elimination: Goal: Will not experience complications related to bowel motility Outcome: Adequate for Discharge Goal: Will not experience complications related to urinary retention Outcome: Adequate for Discharge   Problem: Pain Managment: Goal: General experience of comfort will improve Outcome: Adequate for Discharge   Problem: Safety: Goal: Ability to remain free from injury will improve Outcome: Adequate for Discharge   Problem: Skin Integrity: Goal: Risk for impaired skin integrity will decrease Outcome: Adequate for Discharge   Problem: Health Behavior/Discharge Planning: Goal: Ability to manage health-related needs will improve Outcome: Adequate for Discharge   Problem: Self-Care: Goal: Ability to participate in self-care as condition permits will  improve Outcome: Adequate for Discharge   Problem: Ischemic Stroke/TIA Tissue Perfusion: Goal: Complications of ischemic stroke/TIA will be minimized Outcome: Adequate for Discharge

## 2020-06-30 NOTE — TOC Transition Note (Signed)
Transition of Care [facility]) - CM/SW Discharge Note   Patient Details  Name: [name] MRN: [id number] Date of Birth: Dec 25, 1930  Transition of Care [facility]) CM/SW Contact:  Pollie Friar, RN Phone Number: 06/30/2020, 11:44 AM   Clinical Narrative:    Pt set up with [facility] for [facility] services. Cory with [facility] accepted the referral.  Walker ordered through [place] and will be delivered to the room. Pt has transport home.   Final next level of care: Home w Home Health Services Barriers to Discharge: No Barriers Identified   Patient Goals and CMS Choice   CMS Medicare.gov Compare Post Acute Care list provided to:: Patient Choice offered to / list presented to : Patient  Discharge Placement                       Discharge Plan and Services                DME Arranged: Walker rolling DME Agency: [employer] Date DME Agency Contacted: 06/30/20   Representative spoke with at DME Agency: Freda Munro [facility] Arranged: PT [place]: [place] Date [place]: 06/30/20   Representative spoke with at [place]: [place] ([place]) Interventions     Readmission Risk Interventions No flowsheet data found.

## 2020-07-01 ENCOUNTER — Telehealth: Payer: Self-pay

## 2020-07-01 NOTE — Telephone Encounter (Signed)
Transition Care Management Unsuccessful Follow-up Telephone Call  Date of discharge and from where:  06/30/20 [facility]  Attempts:  1st Attempt  Reason for unsuccessful TCM follow-up call:  Left voice message. Pt may reach me at [phone]

## 2020-07-08 NOTE — Telephone Encounter (Signed)
Transition Care Management Follow-up Telephone Call  Date of discharge and from where: [facility]/30/[facility]   How have you been since you were released from the hospital? Pt states she is doing okay, still feels weak and wakes up dizzy at times. She has contacted her cardiologist and has not heard back from them. Patient c/o feeling anxious and fatigued easily and plans to request rx for ativan or xanax at hosp fu appt due to stress and anxiety.   Any questions or concerns? No  Items Reviewed:  Did the pt receive and understand the discharge instructions provided? Yes   Medications obtained and verified? Yes   Other? No   Any new allergies since your discharge? No   Dietary orders reviewed? Yes  Do you have support at home? Yes   Home Care and Equipment/Supplies: Were home health services ordered? Yes - physical therapy, nursing If so, what is the name of the agency? [facility]  Has the agency set up a time to come to the patient's home? yes Were any new equipment or medical supplies ordered?  No  Functional Questionnaire: (I = Independent and D = Dependent) ADLs: I  Bathing/Dressing- I  Meal Prep- I  Eating- I  Maintaining continence- I  Transferring/Ambulation- I  Managing Meds- I  Follow up appointments reviewed:   PCP Hospital f/u appt confirmed? Yes  Scheduled to see Dr. Roxan Hockey on 07/10/20 @ 11:20.  [facility] f/u appt confirmed? Yes  Scheduled to see neurology on 07/28/20.  Are transportation arrangements needed? No   If their condition worsens, is the pt aware to call PCP or go to the Emergency Dept.? Yes  Was the patient provided with contact information for the PCP's office or ED? Yes  Was to pt encouraged to call back with questions or concerns? Yes

## 2020-07-09 NOTE — Progress Notes (Signed)
Patient ID: Angel [name], female    DOB: 1930-12-02, 86 y.o.   MRN: [id number]  PCP: Delsa Grana, PA-C  Chief Complaint  Patient presents with  . Hospitalization Follow-up    Subjective:   Angel [name] is a 86 y.o. female, presents to clinic with CC of the following:  Chief Complaint  Patient presents with  . Hospitalization Follow-up    HPI:  Patient is an 86 year old female patient of Delsa Grana Last visit with her was in January 2021 Follows up today after hospital admission. With fiance, Scott  The discharge summary was as follows:  Admit date: 06/28/2020 Discharge date: 06/30/2020  Admitted From: home Discharge disposition: home   Recommendations for Outpatient Follow-Up:   1. Home health 2. ASA Plus plavix x 3 weeks then ASA alone   Discharge Diagnosis:   Principal Problem:   Acute CVA (cerebrovascular accident) ([place]) Active Problems:   Hypertension goal BP (blood pressure) < 140/90   Hyperlipidemia LDL goal <70   Asymptomatic bacteriuria   Chest pain   History of Present Illness:   Angel [name] a 86 y.o.femalewith medical history significant forhypertension, hyperlipidemia, presumed CAD, and osteoarthritis who presents to the ED for evaluation of dizziness and unsteady gait.  Patient states she was in her usual state of health whenever she went to bed night of 11/20. She woke up at 7 AM today (06/28/2020) and noticed new onset of dizziness and gait imbalance. Dizziness is described as a room spinning sensation and occurred when she attempted to walk. She says the dizziness would then resolve after resting. She felt as if her balance was off and that her left leg movement was uncoordinated. She says she did fall twice onto the carpet at home due to her dizziness/imbalance. She denied any significant injury or hitting her head. She did not lose consciousness. She did not have any weakness in her upper or lower  extremities. She did not notice any new change in sensation.  She also noticed mid central sternal chest pressure that occurred alongside her dizziness. Chest pressure did not radiate to her arms, neck, jaw, or back. She did not have associated diaphoresis, nausea, vomiting, dyspnea, dysuria, or abdominal pain. She says the chest pressure has completely resolved after receiving sublingual nitroglycerin and oral Ativan. She says she does have similar symptoms on occasion which she says can be caused by emotional stress which she has been experiencing recently.    Hospital Course by Problem:   Stroke:Small R pontineinfarct secondary to small vessel disease CT head No acute abnormality. Small vessel disease. Atrophy.  -MRI? Dorsal R parasagittal pontine infarct. Small vessel disease. -CTA head & neckno LVO. R ICA w/ 50% stenosis. L ICA atherosclerosis. 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 -2D EchoEF60-65%. No source of embolus. LA moderately dilated -LDL93 -HgbA1c6.2 - aspirin to 81 and continue DAPT x 3 weeks then aspirin alone   Hypertension -Stable -Permissive hypertension (OK if < 220/120) but gradually normalize in 5-7 days   Hyperlipidemia - on lipitor 80 - LDL 93, goal < 70   Medical Consultants:   neurology   Discharge Exam:       Vitals:   06/30/20 0804 06/30/20 1217  BP: (!) 162/79 126/71  Pulse: 95 90  Resp: 19 15  Temp: 97.7 F (36.5 C) 98 F (36.7 C)  SpO2: 92% 92%         Vitals:  06/29/20 2330 06/30/20 0331 06/30/20 0804 06/30/20 1217  BP: (!) 148/74 (!) 144/79 (!) 162/79 126/71  Pulse: 90 95 95 90  Resp: 18 18 19 15   Temp: 98.1 F (36.7 C) 98 F (36.7 C) 97.7 F (36.5 C) 98 F (36.7 C)  TempSrc: Oral Oral Oral Oral  SpO2: 94% 93% 92% 92%    General exam: Appears calm and comfortable.    The results of significant diagnostics from this hospitalization (including  imaging, microbiology, ancillary and laboratory) are listed below for reference.       Labs:   Basic Metabolic Panel: Last Labs         Recent Labs  Lab 06/28/20 1613 06/28/20 1613 06/28/20 1647 06/29/20 0500  NA 143  --  144 140  K 4.1   < > 3.8 3.8  CL 108  --  107 105  CO2 25  --   --  24  GLUCOSE 107*  --  93 113*  BUN 29*  --  30* 24*  CREATININE 0.71  --  0.60 0.73  CALCIUM 9.4  --   --  8.5*   < > = values in this interval not displayed.     GFR CrCl cannot be calculated (Unknown ideal weight.). Liver Function Tests: Last Labs      Recent Labs  Lab 06/28/20 1613  AST 26  ALT 28  ALKPHOS 48  BILITOT 0.7  PROT 7.5  ALBUMIN 4.0       Discharge Instructions:       Discharge Instructions    Ambulatory referral to Neurology   Complete by: As directed    Follow up in stroke clinic at [facility] with Frann Rider, NP in about 4 weeks. If not available, consider Dr. Antony Contras, Dr. Bess Harvest, or Dr. Sarina Ill.   Diet - low sodium heart healthy   Complete by: As directed    Discharge instructions   Complete by: As directed    Asa plus plavix x 3 weeks then ASA alone Goal LDL: <70 (yours was in the 90s) so statin increased   Increase activity slowly   Complete by: As directed           Allergies as of 06/30/2020      Reactions   Codeine Other (See Comments)   "zoned out"   Levofloxacin Other (See Comments)   AMS   Myrbetriq [mirabegron] Other (See Comments)   Severe depression   Prednisone Other (See Comments)   AMS   Sulfa Antibiotics Other (See Comments)   AMS               Medication List        STOP taking these medications       amLODipine 5 MG tablet Commonly known as: NORVASC   ibuprofen 200 MG tablet Commonly known as: ADVIL             TAKE these medications       aspirin 81 MG EC tablet Take 1 tablet (81 mg total) by mouth daily. Swallow  whole. Start taking on: July 01, 2020   atorvastatin 80 MG tablet Commonly known as: Lipitor Take 1 tablet (80 mg total) by mouth daily. What changed:   medication strength  how much to take  when to take this   clopidogrel 75 MG tablet Commonly known as: PLAVIX Take 1 tablet (75 mg total) by mouth daily. Start taking on: July 01, 2020   Fish  Oil 1000 MG Caps Take by mouth. Krill oil   glucosamine-chondroitin 500-400 MG tablet Take 1 tablet by mouth daily.   lisinopril 40 MG tablet Commonly known as: ZESTRIL Take 1 tablet (40 mg total) by mouth daily. Start taking on: July 05, 2020 What changed: These instructions start on July 05, 2020. If you are unsure what to do until then, ask your doctor or other care provider.   metoprolol succinate 50 MG 24 hr tablet Commonly known as: TOPROL-XL TAKE 1 & 1/2 TABLET BY MOUTH DAILY TAKE WITH OR IMMEDIATELY FOLLOWING A MEAL What changed: See the new instructions.   multivitamin capsule Take 1 capsule by mouth daily.   nitroGLYCERIN 0.4 MG SL tablet Commonly known as: NITROSTAT Place 1 tablet (0.4 mg total) under the tongue every 5 (five) minutes as needed for chest pain.   vitamin B-12 100 MCG tablet Commonly known as: CYANOCOBALAMIN Take 1,000 mcg by mouth daily.   vitamin C 100 MG tablet Take 500 mg by mouth daily. Gummy 750mg    VITAMIN D (ERGOCALCIFEROL) PO Take 1,000 Units by mouth daily.                        Durable Medical Equipment  (From admission, onward)                        Start     Ordered    06/30/20 1136  For home use only DME Walker rolling  Once       Question Answer Comment  Walker: With [place]   Patient needs a walker to treat with the following condition Stroke ([place])      06/30/20 1135           Since discharge, pt states she is doing okay, mainly in the last couple days, no dizzy episodes yesterday nor today, was a little  weaker initially, and has been getting stronger, with her getting home physical therapy presently.  She had a session yesterday.  Notes in the last couple days she has felt better. Was still feeling weak and feeling dizzy at times when first left the hospital. Has a BP cuff at home, not checking BP's in recent past  Patient c/o feeling anxious intermittently.  Notes increased stresses since discharge and when is stressed, HR increases, no SOB, no CP, occas chest pressure and takes NTG when she has the chest pressure and does relieve the symptoms when takes. Has used that twice since leaving the hospital and both times stopped the chest pressure and she is aware if the chest pressure continues, to go to ER and emphasized that today. When has the anxiety episodes, stops and does deep breathing, also does meditation.  She notes that has been helpful.  At one point had Xanax in her past, which was helpful, and she notes she was given Ativan in the hospital for increased stress and anxiety. Patient was adamant about not wanting to take a medicine daily when discussed these medicines and noted the concerns with the benzodiazepines like Ativan and Xanax.  She noted she just wanted a medicine to help prn. Notes has a difficult family situation that often prompts the anxiety, and hoping will be much resolved soon. Now noting having stress/anxiety episodes not all that frequently, 1-2 times since left hospital.  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  Has f/u with cardiology in about 2 weeks, and noted the importance of keeping that appointment. Has follow-up with neurology planned in about 3 weeks, and also noted the importance of keeping that appointment.  She denied any new or symptoms of concern with no one-sided symptoms of concern, no vision changes, no increased headaches, no facial droop or numbness or tingling.  She  notes the physical therapy at home has been helpful, with last session yesterday, and also has a nutrition person that comes to her house to help.  Patient Active Problem List   Diagnosis Date Noted  . Acute CVA (cerebrovascular accident) ([place]) 06/28/2020  . Asymptomatic bacteriuria 06/28/2020  . Chest pain 06/28/2020  . Arthritis of knee, degenerative 05/22/2018  . Cataract, right eye 05/17/2018  . Aortic stenosis, mild 02/22/2018  . Obesity (BMI 30.0-34.9) 01/18/2018  . Hip pain, chronic 04/02/2015  . Hearing loss of both ears 04/02/2015  . Osteopenia of the elderly 04/02/2015  . Hormone replacement therapy (postmenopausal) 04/02/2015  . Aortic aneurysm without rupture ([place]) 04/02/2015  . Situational anxiety 04/02/2015  . Hyperlipidemia LDL goal <70 11/18/2014  . Mixed stress and urge urinary incontinence 06/29/2014  . Hypertension goal BP (blood pressure) < 140/90 11/06/2013  . CAD (coronary artery disease) 11/06/2013      Current Outpatient Medications:  .  Ascorbic Acid (VITAMIN C) 100 MG tablet, Take 500 mg by mouth daily. Gummy 750mg , Disp: , Rfl:  .  aspirin EC 81 MG EC tablet, Take 1 tablet (81 mg total) by mouth daily. Swallow whole., Disp: 30 tablet, Rfl: 11 .  atorvastatin (LIPITOR) 80 MG tablet, Take 1 tablet (80 mg total) by mouth daily., Disp: 30 tablet, Rfl: 0 .  clopidogrel (PLAVIX) 75 MG tablet, Take 1 tablet (75 mg total) by mouth daily., Disp: 21 tablet, Rfl: 0 .  glucosamine-chondroitin 500-400 MG tablet, Take 1 tablet by mouth daily., Disp: , Rfl:  .  lisinopril (ZESTRIL) 40 MG tablet, Take 1 tablet (40 mg total) by mouth daily., Disp: , Rfl:  .  metoprolol succinate (TOPROL-XL) 50 MG 24 hr tablet, TAKE 1 & 1/2 TABLET BY MOUTH DAILY TAKE WITH OR IMMEDIATELY FOLLOWING A MEAL (Patient taking differently: Take 75 mg by mouth daily. ), Disp: 135 tablet, Rfl: 3 .  Multiple Vitamin (MULTIVITAMIN) capsule, Take 1 capsule by mouth daily., Disp: , Rfl:  .  Omega-3 Fatty  Acids (FISH OIL) 1000 MG CAPS, Take by mouth. Krill oil, Disp: , Rfl:  .  vitamin B-12 (CYANOCOBALAMIN) 100 MCG tablet, Take 1,000 mcg by mouth daily. , Disp: , Rfl:  .  VITAMIN D, ERGOCALCIFEROL, PO, Take 1,000 Units by mouth daily., Disp: , Rfl:  .  nitroGLYCERIN (NITROSTAT) 0.4 MG SL tablet, Place 1 tablet (0.4 mg total) under the tongue every 5 (five) minutes as needed for chest pain., Disp: 25 tablet, Rfl: 3   Allergies  Allergen Reactions  . Codeine Other (See Comments)    "zoned out"  . Levofloxacin Other (See Comments)    AMS  . Myrbetriq [Mirabegron] Other (See Comments)    Severe depression  . Prednisone Other (See Comments)    AMS  . Sulfa Antibiotics Other (See Comments)    AMS     Past Surgical History:  Procedure Laterality Date  . ABDOMINAL HYSTERECTOMY  1976   total  . APPENDECTOMY    . BREAST EXCISIONAL BIOPSY Left 1987   benign  . CATARACT EXTRACTION, BILATERAL  08/2018   Dr.  Manuella Ghazi with [facility]     Family History  Problem Relation Age of Onset  . Stroke Mother   . Breast cancer Maternal Grandmother 60  . Heart disease Maternal Grandmother   . Cancer Brother        skin     Social History   Tobacco Use  . Smoking status: Never Smoker  . Smokeless tobacco: Never Used  Substance Use Topics  . Alcohol use: Yes    Alcohol/week: 1.0 standard drink    Types: 1 Cans of beer per week    Comment: rare    With staff assistance, above reviewed with the patient today.  ROS: As per HPI, otherwise no specific complaints on a limited and focused system review   No results found for this or any previous visit (from the past 72 hour(s)).   PHQ2/9: Depression screen [facility] 2/9 07/10/2020 08/16/2019 08/13/2019 04/18/2019 04/18/2019  Decreased Interest 0 0 0 0 0  Down, Depressed, Hopeless 0 0 0 0 0  PHQ - 2 Score 0 0 0 0 0  Altered sleeping - 0 - 0 0  Tired, decreased energy - 0 - 0 0  Change in appetite - 0 - 0 0  Feeling bad or failure about  yourself  - 0 - 0 0  Trouble concentrating - 0 - 0 0  Moving slowly or fidgety/restless - 0 - 0 0  Suicidal thoughts - 0 - 0 0  PHQ-9 Score - 0 - 0 0  Difficult doing work/chores - Not difficult at all - Not difficult at all -   PHQ-2/9 Result is neg  Fall Risk: Fall Risk  07/10/2020 08/16/2019 08/13/2019 04/18/2019 02/27/2019  Falls in the past year? 1 0 0 0 0  Number falls in past yr: 1 0 0 0 0  Injury with Fall? 0 0 0 0 0  Risk for fall due to : - - Orthopedic patient - -  Follow up - - Falls prevention discussed - -      Objective:   Vitals:   07/10/20 1118  BP: 124/74  Pulse: 78  Resp: 16  Temp: 98 F (36.7 C)  TempSrc: Oral  SpO2: 94%  Weight: 190 lb 11.2 oz (86.5 kg)  Height: 5\' 4"  (1.626 m)    Body mass index is 32.73 kg/m.  Physical Exam   NAD, masked, very pleasant, notes comfortable resting in the chair.  Struggles to get up and out of the chair, and was not comfortable trying to get up the step onto the exam table.  Exam done in the chair today. HEENT - [place]/AT, sclera anicteric, PERRL, EOMI, conj - non-inj'ed, mild right ptosis of the upper lid noted (she notes that is not new), pharynx clear Neck - supple, no adenopathy, carotids 2+ and = without bruits bilat Car - RRR without m/g/r, not tachycardic Pulm- RR and effort normal at rest, CTA without wheeze or rales Abd - soft, NT diffusely, obese, ND,  Back - no CVA tenderness Ext - no marked LE edema,  Neuro/psychiatric - affect was not flat, appropriate with conversation  Alert and oriented, speech was not rapid  Cranial nerves II through XII intact with acuity not tested today in the office, had no facial droop, no tongue deviation, sensation intact in the face with light touch, facial muscle strength adequate, hearing grossly intact  Grossly non-focal -adequate strength in extremities with good grip strength bilaterally, sensation intact to LT in distal extremities  Results for orders placed or performed  during the hospital encounter of 06/28/20  Resp Panel by RT-PCR (Flu A&B, Covid) Nasopharyngeal Swab   Specimen: Nasopharyngeal Swab; Nasopharyngeal(NP) swabs in vial transport medium  Result Value Ref Range   SARS Coronavirus 2 by RT PCR NEGATIVE NEGATIVE   Influenza A by PCR NEGATIVE NEGATIVE   Influenza B by PCR NEGATIVE NEGATIVE  APTT  Result Value Ref Range   aPTT 27 24 - 36 seconds  Protime-INR  Result Value Ref Range   Prothrombin Time 12.9 11.4 - 15.2 seconds   INR 1.0 0.8 - 1.2  CBC with Differential/Platelet  Result Value Ref Range   WBC 8.1 4.0 - 10.5 K/uL   RBC 4.30 3.87 - 5.11 MIL/uL   Hemoglobin 13.9 12.0 - 15.0 g/dL   HCT 42.7 36 - 46 %   MCV 99.3 80.0 - 100.0 fL   MCH 32.3 26.0 - 34.0 pg   MCHC 32.6 30.0 - 36.0 g/dL   RDW 12.8 11.5 - 15.5 %   Platelets 270 150 - 400 K/uL   nRBC 0.0 0.0 - 0.2 %   Neutrophils Relative % 74 %   Neutro Abs 5.9 1.7 - 7.7 K/uL   Lymphocytes Relative 15 %   Lymphs Abs 1.2 0.7 - 4.0 K/uL   Monocytes Relative 9 %   Monocytes Absolute 0.8 0.1 - 1.0 K/uL   Eosinophils Relative 2 %   Eosinophils Absolute 0.1 0.0 - 0.5 K/uL   Basophils Relative 0 %   Basophils Absolute 0.0 0.0 - 0.1 K/uL   Immature Granulocytes 0 %   Abs Immature Granulocytes 0.02 0.00 - 0.07 K/uL  Ethanol  Result Value Ref Range   Alcohol, Ethyl (B) <10 <10 mg/dL  Comprehensive metabolic panel  Result Value Ref Range   Sodium 143 135 - 145 mmol/L   Potassium 4.1 3.5 - 5.1 mmol/L   Chloride 108 98 - 111 mmol/L   CO2 25 22 - 32 mmol/L   Glucose, Bld 107 (H) 70 - 99 mg/dL   BUN 29 (H) 8 - 23 mg/dL   Creatinine, Ser 0.71 0.44 - 1.00 mg/dL   Calcium 9.4 8.9 - 10.3 mg/dL   Total Protein 7.5 6.5 - 8.1 g/dL   Albumin 4.0 3.5 - 5.0 g/dL   AST 26 15 - 41 U/L   ALT 28 0 - 44 U/L   Alkaline Phosphatase 48 38 - 126 U/L   Total Bilirubin 0.7 0.3 - 1.2 mg/dL   GFR, Estimated >60 >60 mL/min   Anion gap 10 5 - 15  Urine rapid drug screen (hosp performed)  Result Value  Ref Range   Opiates NONE DETECTED NONE DETECTED   Cocaine NONE DETECTED NONE DETECTED   Benzodiazepines NONE DETECTED NONE DETECTED   Amphetamines NONE DETECTED NONE DETECTED   Tetrahydrocannabinol NONE DETECTED NONE DETECTED   Barbiturates NONE DETECTED NONE DETECTED  Urinalysis, Routine w reflex microscopic  Result Value Ref Range   Color, Urine YELLOW YELLOW   APPearance CLEAR CLEAR   Specific Gravity, Urine 1.011 1.005 - 1.030   pH 5.0 5.0 - 8.0   Glucose, UA NEGATIVE NEGATIVE mg/dL   Hgb urine dipstick NEGATIVE NEGATIVE   Bilirubin Urine NEGATIVE NEGATIVE   Ketones, ur NEGATIVE NEGATIVE mg/dL   Protein, ur NEGATIVE NEGATIVE mg/dL   Nitrite POSITIVE (A) NEGATIVE   Leukocytes,Ua MODERATE (A) NEGATIVE   RBC / HPF 0-5 0 - 5 RBC/hpf   WBC, UA >50 (H) 0 - 5  WBC/hpf   Bacteria, UA MANY (A) NONE SEEN   Squamous Epithelial / LPF 0-5 0 - 5   Mucus PRESENT   Hemoglobin A1c  Result Value Ref Range   Hgb A1c MFr Bld 6.2 (H) 4.8 - 5.6 %   Mean Plasma Glucose 131.24 mg/dL  Lipid panel  Result Value Ref Range   Cholesterol 160 0 - 200 mg/dL   Triglycerides 133 <150 mg/dL   HDL 40 (L) >40 mg/dL   Total CHOL/HDL Ratio 4.0 RATIO   VLDL 27 0 - 40 mg/dL   LDL Cholesterol 93 0 - 99 mg/dL  CBC  Result Value Ref Range   WBC 6.2 4.0 - 10.5 K/uL   RBC 3.70 (L) 3.87 - 5.11 MIL/uL   Hemoglobin 12.0 12.0 - 15.0 g/dL   HCT 36.9 36 - 46 %   MCV 99.7 80.0 - 100.0 fL   MCH 32.4 26.0 - 34.0 pg   MCHC 32.5 30.0 - 36.0 g/dL   RDW 12.9 11.5 - 15.5 %   Platelets 226 150 - 400 K/uL   nRBC 0.0 0.0 - 0.2 %  Basic metabolic panel  Result Value Ref Range   Sodium 140 135 - 145 mmol/L   Potassium 3.8 3.5 - 5.1 mmol/L   Chloride 105 98 - 111 mmol/L   CO2 24 22 - 32 mmol/L   Glucose, Bld 113 (H) 70 - 99 mg/dL   BUN 24 (H) 8 - 23 mg/dL   Creatinine, Ser 0.73 0.44 - 1.00 mg/dL   Calcium 8.5 (L) 8.9 - 10.3 mg/dL   GFR, Estimated >60 >60 mL/min   Anion gap 11 5 - 15  CBG monitoring, ED  Result Value  Ref Range   Glucose-Capillary 98 70 - 99 mg/dL  I-stat chem 8, ED  Result Value Ref Range   Sodium 144 135 - 145 mmol/L   Potassium 3.8 3.5 - 5.1 mmol/L   Chloride 107 98 - 111 mmol/L   BUN 30 (H) 8 - 23 mg/dL   Creatinine, Ser 0.60 0.44 - 1.00 mg/dL   Glucose, Bld 93 70 - 99 mg/dL   Calcium, Ion 1.18 1.15 - 1.40 mmol/L   TCO2 25 22 - 32 mmol/L   Hemoglobin 13.6 12.0 - 15.0 g/dL   HCT 40.0 36 - 46 %  ECHOCARDIOGRAM COMPLETE  Result Value Ref Range   BP 137/73 mmHg   S' Lateral 1.90 cm   AR max vel 1.14 cm2   AV Area VTI 1.20 cm2   AV Mean grad 14.0 mmHg   AV Peak grad 24.6 mmHg   Ao pk vel 2.48 m/s   Area-P 1/2 3.31 cm2   AV Area mean vel 1.24 cm2  Troponin I (High Sensitivity)  Result Value Ref Range   Troponin I (High Sensitivity) 5 <18 ng/L  Troponin I (High Sensitivity)  Result Value Ref Range   Troponin I (High Sensitivity) 6 <18 ng/L       Assessment & Plan:    1. Encounter for examination following treatment at hospital   2. Cerebrovascular accident (CVA), unspecified mechanism ([place]) Patient noted she was informed by the neurologist in the hospital that she had a TIA concern, with her symptoms transient and no persistent deficits noted.  Her imaging studies were consistent with small vessel disease. She is aware of the instructions to stop the Plavix and to take soon and continue on the low-dose aspirin. Emphasized the importance of keeping her blood pressure well controlled, staying on  the statin product as well. Also has a planned follow-up with neurology in about 3 weeks.  3. Primary hypertension Her blood pressure was good today, and emphasized the importance of keeping this well controlled in the future.  4. Hyperlipidemia LDL goal <70 She currently is on a statin and will continue, and noted the statins benefit goes beyond lowering cholesterol in her case.  5. Stage 3 chronic kidney disease, unspecified whether stage 3a or 3b CKD ([place]) We will  continue to monitor over time.  6. Anxiety/7.  Stable angina Her main concern today was these episodes of stress and anxiety, with heart racing noted when these occur, and has had a couple episodes of chest pressure that was relieved quickly with the nitroglycerin product.  Noted this is concerning for a stable angina picture, and the importance of following up with cardiology in a couple weeks for that. Do feel having a medicine to use to help with anxiety is important, and reviewed the longer acting daily medicines that are often best to use in the situations.  She very much did not want to have a medicine to take every day, and after discussions on the concerns with long-term use of benzodiazepines, agreed to a short term use of these entities with close monitoring. We will prescribe a Xanax product-0.25 mg to use up to twice a day as needed and emphasized using very sparingly, not to use daily, with plans to continue these only in the short-term.  If she is needing these more frequently, do feel a daily medicine will be needed and she was understanding of that.  She notes she has had some increased stresses from this recent hospitalization, as well as family stresses, and does think they will significantly improve over time.  I also emphasized if she has any episodes of chest pressure occurring that are not relieved quickly with the nitroglycerin product, the importance of getting to the emergency room immediately, and she was understanding of that.  Felt best to have a follow-up with Kristeen Miss, her PCP and an approximate 4-week timeframe, as I will be leaving this practice in the very near future, and important to have follow-up with her PCP in the future noting the above. Can follow-up sooner as needed.  Towanda Malkin, MD 07/10/20 11:38 AM

## 2020-07-10 ENCOUNTER — Other Ambulatory Visit: Payer: Self-pay

## 2020-07-10 ENCOUNTER — Ambulatory Visit (INDEPENDENT_AMBULATORY_CARE_PROVIDER_SITE_OTHER): Payer: Medicare Other | Admitting: Internal Medicine

## 2020-07-10 ENCOUNTER — Encounter: Payer: Self-pay | Admitting: Internal Medicine

## 2020-07-10 VITALS — BP 124/74 | HR 78 | Temp 98.0°F | Resp 16 | Ht 64.0 in | Wt 190.7 lb

## 2020-07-10 DIAGNOSIS — F419 Anxiety disorder, unspecified: Secondary | ICD-10-CM

## 2020-07-10 DIAGNOSIS — I639 Cerebral infarction, unspecified: Secondary | ICD-10-CM

## 2020-07-10 DIAGNOSIS — I10 Essential (primary) hypertension: Secondary | ICD-10-CM | POA: Diagnosis not present

## 2020-07-10 DIAGNOSIS — E785 Hyperlipidemia, unspecified: Secondary | ICD-10-CM

## 2020-07-10 DIAGNOSIS — N183 Chronic kidney disease, stage 3 unspecified: Secondary | ICD-10-CM

## 2020-07-10 DIAGNOSIS — I2089 Other forms of angina pectoris: Secondary | ICD-10-CM

## 2020-07-10 DIAGNOSIS — I208 Other forms of angina pectoris: Secondary | ICD-10-CM

## 2020-07-10 DIAGNOSIS — Z09 Encounter for follow-up examination after completed treatment for conditions other than malignant neoplasm: Secondary | ICD-10-CM | POA: Diagnosis not present

## 2020-07-10 MED ORDER — ALPRAZOLAM 0.25 MG PO TABS: ORAL_TABLET | ORAL | 1 refills | Status: DC

## 2020-07-14 ENCOUNTER — Telehealth: Payer: Self-pay

## 2020-07-14 NOTE — Telephone Encounter (Signed)
FYI

## 2020-07-14 NOTE — Telephone Encounter (Signed)
Copied from [place] [id number]. Topic: General - Inquiry >> Jul 14, 2020  9:56 AM Greggory Keen D wrote: Reason for CRM: Diane with [facility] called to say they are doing a nuring eval with pt this week instead of last week

## 2020-07-18 ENCOUNTER — Emergency Department (HOSPITAL_COMMUNITY)
Admission: EM | Admit: 2020-07-18 | Discharge: 2020-07-18 | Disposition: A | Discharge status: Home / Self Care | Payer: Medicare Other | Attending: Emergency Medicine | Admitting: Emergency Medicine

## 2020-07-18 ENCOUNTER — Encounter (HOSPITAL_COMMUNITY): Payer: Self-pay | Admitting: *Deleted

## 2020-07-18 ENCOUNTER — Emergency Department (HOSPITAL_COMMUNITY): Payer: Medicare Other

## 2020-07-18 ENCOUNTER — Other Ambulatory Visit: Payer: Self-pay

## 2020-07-18 DIAGNOSIS — R6884 Jaw pain: Secondary | ICD-10-CM | POA: Diagnosis not present

## 2020-07-18 DIAGNOSIS — Z79899 Other long term (current) drug therapy: Secondary | ICD-10-CM | POA: Insufficient documentation

## 2020-07-18 DIAGNOSIS — R000 Tachycardia, unspecified: Secondary | ICD-10-CM | POA: Insufficient documentation

## 2020-07-18 DIAGNOSIS — Z7982 Long term (current) use of aspirin: Secondary | ICD-10-CM | POA: Diagnosis not present

## 2020-07-18 DIAGNOSIS — Z8673 Personal history of transient ischemic attack (TIA), and cerebral infarction without residual deficits: Secondary | ICD-10-CM | POA: Insufficient documentation

## 2020-07-18 DIAGNOSIS — I10 Essential (primary) hypertension: Secondary | ICD-10-CM | POA: Diagnosis not present

## 2020-07-18 DIAGNOSIS — I2510 Atherosclerotic heart disease of native coronary artery without angina pectoris: Secondary | ICD-10-CM | POA: Insufficient documentation

## 2020-07-18 DIAGNOSIS — R030 Elevated blood-pressure reading, without diagnosis of hypertension: Secondary | ICD-10-CM | POA: Diagnosis present

## 2020-07-18 LAB — CBC
HCT: 43.4 % (ref 36.0–46.0)
Hemoglobin: 14.2 g/dL (ref 12.0–15.0)
MCH: 32.2 pg (ref 26.0–34.0)
MCHC: 32.7 g/dL (ref 30.0–36.0)
MCV: 98.4 fL (ref 80.0–100.0)
Platelets: 262 10*3/uL (ref 150–400)
RBC: 4.41 MIL/uL (ref 3.87–5.11)
RDW: 13.2 % (ref 11.5–15.5)
WBC: 7.7 10*3/uL (ref 4.0–10.5)
nRBC: 0 % (ref 0.0–0.2)

## 2020-07-18 LAB — URINALYSIS, ROUTINE W REFLEX MICROSCOPIC
Bilirubin Urine: NEGATIVE
Glucose, UA: NEGATIVE mg/dL
Hgb urine dipstick: NEGATIVE
Ketones, ur: NEGATIVE mg/dL
Nitrite: NEGATIVE
Protein, ur: NEGATIVE mg/dL
Specific Gravity, Urine: 1.006 (ref 1.005–1.030)
pH: 7 (ref 5.0–8.0)

## 2020-07-18 LAB — BASIC METABOLIC PANEL
Anion gap: 12 (ref 5–15)
BUN: 20 mg/dL (ref 8–23)
CO2: 24 mmol/L (ref 22–32)
Calcium: 9.3 mg/dL (ref 8.9–10.3)
Chloride: 106 mmol/L (ref 98–111)
Creatinine, Ser: 0.6 mg/dL (ref 0.44–1.00)
GFR, Estimated: >60 mL/min (ref >60)
Glucose, Bld: 96 mg/dL (ref 70–99)
Potassium: 3.7 mmol/L (ref 3.5–5.1)
Sodium: 142 mmol/L (ref 135–145)

## 2020-07-18 LAB — TROPONIN I (HIGH SENSITIVITY)
Troponin I (High Sensitivity): 6 ng/L (ref <18)
Troponin I (High Sensitivity): 7 ng/L (ref <18)

## 2020-07-18 MED ORDER — LORAZEPAM 2 MG/ML IJ SOLN
0.5000 mg | Freq: Once | INTRAMUSCULAR | Status: AC
  Administered 2020-07-18: 18:00:00 0.5 mg via INTRAVENOUS
  Filled 2020-07-18: qty 1

## 2020-07-18 MED ORDER — HYDRALAZINE HCL 20 MG/ML IJ SOLN
10.0000 mg | Freq: Once | INTRAMUSCULAR | Status: AC
  Administered 2020-07-18: 17:00:00 10 mg via INTRAVENOUS
  Filled 2020-07-18: qty 1

## 2020-07-18 MED ORDER — AMLODIPINE BESYLATE 5 MG PO TABS: 5.0000 mg | ORAL_TABLET | Freq: Every day | ORAL | 0 refills | Status: DC

## 2020-07-18 MED ORDER — LABETALOL HCL 5 MG/ML IV SOLN
20.0000 mg | Freq: Once | INTRAVENOUS | Status: AC
  Administered 2020-07-18: 16:00:00 20 mg via INTRAVENOUS
  Filled 2020-07-18: qty 4

## 2020-07-18 MED ORDER — AMLODIPINE BESYLATE 5 MG PO TABS
5.0000 mg | ORAL_TABLET | Freq: Once | ORAL | Status: AC
  Administered 2020-07-18: 19:00:00 5 mg via ORAL
  Filled 2020-07-18: qty 1

## 2020-07-18 NOTE — ED Triage Notes (Signed)
Pt present with HTN, chest tightness and jaw pain started earlier today.

## 2020-07-18 NOTE — Discharge Instructions (Addendum)
You were given the first dose of your amlodipine here.  Please restart this as you were taking prior to your hospitalization.  A prescription has been called into the [facility] in [place]. Return to the emergency department if you are worse at any time especially worsening pain, fever, or weakness. Please follow-up with your doctor on Tuesday as scheduled.

## 2020-07-18 NOTE — ED Provider Notes (Signed)
[facility] DEPT Provider Note   CSN: [id number] Arrival date & time: 07/18/20  1415     History Chief Complaint  Patient presents with  . Hypertension  . Jaw Pain  . Chest Pain    Angel [name] is a 86 y.o. female.  HPI 86 yo female history of recent stroke, cad, hypertension presents today with jaw pain and hypertension.  She was recently admitted for a stroke.  Amlodipine was discontinued at that time.  She has been home and improving with home health coming out and therapy.  Today she had some pain in her jaw.  Her blood pressure was elevated.  A new home health aide came in and she took her blood pressure each time she had a bowel movement.  She had several bowel movements today.  She reports that this is not unusual for her she does not feel that she has been sick from this.  However, her blood pressures were high and it was felt that she should come to the hospital to be evaluated for this.  She denies any headache, neck pain, chest pain, dyspnea, abdominal pain, nausea, vomiting, or diarrhea.   She has had her Covid vaccine and does not have any signs or symptoms of Covid and no known exposures  Past Medical History:  Diagnosis Date  . Anxiety   . Aortic aneurysm without rupture ([place])   . CAD (coronary artery disease)   . Chronic hip pain   . Endometriosis   . Hearing loss   . HTN, goal below 150/90   . Hyperlipidemia LDL goal <70 11/18/2014  . Osteopenia of the elderly   . Urge and stress incontinence     Patient Active Problem List   Diagnosis Date Noted  . Stable angina ([place]) 07/10/2020  . Acute CVA (cerebrovascular accident) ([place]) 06/28/2020  . Asymptomatic bacteriuria 06/28/2020  . Chest pain 06/28/2020  . Arthritis of knee, degenerative 05/22/2018  . Cataract, right eye 05/17/2018  . Aortic stenosis, mild 02/22/2018  . Obesity (BMI 30.0-34.9) 01/18/2018  . Hip pain, chronic 04/02/2015  . Hearing loss of both ears 04/02/2015   . Osteopenia of the elderly 04/02/2015  . Hormone replacement therapy (postmenopausal) 04/02/2015  . Aortic aneurysm without rupture ([place]) 04/02/2015  . Situational anxiety 04/02/2015  . Hyperlipidemia LDL goal <70 11/18/2014  . Mixed stress and urge urinary incontinence 06/29/2014  . Hypertension goal BP (blood pressure) < 140/90 11/06/2013  . CAD (coronary artery disease) 11/06/2013    Past Surgical History:  Procedure Laterality Date  . ABDOMINAL HYSTERECTOMY  1976   total  . APPENDECTOMY    . BREAST EXCISIONAL BIOPSY Left 1987   benign  . CATARACT EXTRACTION, BILATERAL  08/2018   Dr. Manuella Ghazi with [facility]     OB History   No obstetric history on file.     Family History  Problem Relation Age of Onset  . Stroke Mother   . Breast cancer Maternal Grandmother 60  . Heart disease Maternal Grandmother   . Cancer Brother        skin    Social History   Tobacco Use  . Smoking status: Never Smoker  . Smokeless tobacco: Never Used  Vaping Use  . Vaping Use: Never used  Substance Use Topics  . Alcohol use: Yes    Alcohol/week: 1.0 standard drink    Types: 1 Cans of beer per week    Comment: rare  . Drug use: No  Home Medications Prior to Admission medications   Medication Sig Start Date End Date Taking? Authorizing Provider  ALPRAZolam Duanne Moron) 0.25 MG tablet Take 1 tablet up to twice daily as needed for anxiety episodes.  Use sparingly and not to take it every day. 07/10/20   Towanda Malkin, MD  Ascorbic Acid (VITAMIN C) 100 MG tablet Take 500 mg by mouth daily. Gummy 750mg     [provider]  aspirin EC 81 MG EC tablet Take 1 tablet (81 mg total) by mouth daily. Swallow whole. 07/01/20   Geradine Girt, DO  atorvastatin (LIPITOR) 80 MG tablet Take 1 tablet (80 mg total) by mouth daily. 06/30/20 06/30/21  Geradine Girt, DO  clopidogrel (PLAVIX) 75 MG tablet Take 1 tablet (75 mg total) by mouth daily. 07/01/20   Geradine Girt, DO   glucosamine-chondroitin 500-400 MG tablet Take 1 tablet by mouth daily.    [provider]  lisinopril (ZESTRIL) 40 MG tablet Take 1 tablet (40 mg total) by mouth daily. 07/05/20   Geradine Girt, DO  metoprolol succinate (TOPROL-XL) 50 MG 24 hr tablet TAKE 1 & 1/2 TABLET BY MOUTH DAILY TAKE WITH OR IMMEDIATELY FOLLOWING A MEAL Patient taking differently: Take 75 mg by mouth daily.  03/24/20   Belva Crome, MD  Multiple Vitamin (MULTIVITAMIN) capsule Take 1 capsule by mouth daily.    [provider]  nitroGLYCERIN (NITROSTAT) 0.4 MG SL tablet Place 1 tablet (0.4 mg total) under the tongue every 5 (five) minutes as needed for chest pain. 11/20/19 02/18/20  Richardson Dopp T, PA-C  Omega-3 Fatty Acids (FISH OIL) 1000 MG CAPS Take by mouth. Krill oil    [provider]  vitamin B-12 (CYANOCOBALAMIN) 100 MCG tablet Take 1,000 mcg by mouth daily.     [provider]  VITAMIN D, ERGOCALCIFEROL, PO Take 1,000 Units by mouth daily.    [provider]    Allergies    Codeine, Levofloxacin, Myrbetriq [mirabegron], Prednisone, and Sulfa antibiotics  Review of Systems   Review of Systems  All other systems reviewed and are negative.   Physical Exam Updated Vital Signs BP (!) 205/109   Pulse 84   Temp 97.8 F (36.6 C) (Oral)   Resp 15   Ht 1.626 m (5\' 4" )   Wt 86.2 kg   LMP  (LMP Unknown)   SpO2 95%   BMI 32.61 kg/m   Physical Exam Vitals and nursing note reviewed.  Constitutional:      Appearance: She is well-developed.  HENT:     Head: Normocephalic.  Eyes:     Pupils: Pupils are equal, round, and reactive to light.  Cardiovascular:     Rate and Rhythm: Regular rhythm. Tachycardia present.     Heart sounds: Normal heart sounds.  Pulmonary:     Effort: Pulmonary effort is normal.     Breath sounds: Normal breath sounds.  Abdominal:     General: Bowel sounds are normal.     Palpations: Abdomen is soft.  Musculoskeletal:         General: Normal range of motion.     Cervical back: Normal range of motion.  Skin:    General: Skin is warm and dry.     Capillary Refill: Capillary refill takes less than 2 seconds.  Neurological:     General: No focal deficit present.     Mental Status: She is alert and oriented to person, place, and time.     Cranial  Nerves: Cranial nerves are intact. No cranial nerve deficit.     Sensory: Sensation is intact.     Motor: Motor function is intact. No weakness.     Coordination: Coordination is intact.     Gait: Gait is intact.     Deep Tendon Reflexes: Reflexes are normal and symmetric.     Comments: Extraocular movements in tact and no visual field deficits noted     ED Results / Procedures / Treatments   Labs (all labs ordered are listed, but only abnormal results are displayed) Labs Reviewed  BASIC METABOLIC PANEL  CBC  TROPONIN I (HIGH SENSITIVITY)    EKG EKG Interpretation  Date/Time:  Saturday July 18 2020 17:21:22 EST Ventricular Rate:  89 PR Interval:    QRS Duration: 93 QT Interval:  416 QTC Calculation: 507 R Axis:   11 Text Interpretation: Sinus rhythm Atrial premature complex Probable left atrial enlargement Low voltage, precordial leads Prolonged QT interval Confirmed by Pattricia Boss [phone]) on 07/18/2020 5:37:17 PM   Radiology DG Chest 2 View  Result Date: 07/18/2020 CLINICAL DATA:  Chest pain EXAM: CHEST - 2 VIEW COMPARISON:  Chest x-Caila Cirelli 06/27/2020 FINDINGS: The heart size and mediastinal contours are unchanged. Aortic arch and descending aorta calcifications. No focal consolidation. No pulmonary edema. No pleural effusion. No pneumothorax. No acute osseous abnormality. IMPRESSION: No active cardiopulmonary disease. Electronically Signed   By: Iven Finn M.D.   On: 07/18/2020 15:17    Procedures Procedures (including critical care time)  Medications Ordered in ED Medications - No data to display  ED Course  I have reviewed the triage  vital signs and the nursing notes.  Pertinent labs & imaging results that were available during my care of the patient were reviewed by me and considered in my medical decision making (see chart for details).    MDM Rules/Calculators/A&P                          5:22 PM Patient received 20 labetalol approximately 1 hour ago and then hydralazine 20 minutes ago. Blood pressure 198/98. Heart rate in the 90s. Patient states she is having some pressure type discomfort in her submandibular area. She feels like this is consistent with anxiety. Plan Ativan 0.5 mg and will reevaluate. Repeat ekg with chin/neck tightness without acute ischemic changes. 6:50 PM BP 186/98 Restarted amlodipine- stopped on last d/c Plan restart amlodipine Will d/c if delta trop negative  86 year old female with recent stroke presents today with some jaw pain earlier and high blood pressure.  She had her amlodipine stopped prior to discharge.  She was given blood pressure medicine here in the ED and her amlodipine was restarted.  Blood pressure has been decreasing.  EKG on arrival and repeat were without any evidence of acute ischemia.  Troponin and repeat troponin are normal.  I discussed the results with the patient, her significant other, her daughter-in-law and son. We have discussed return precautions and need for follow-up and they voiced understanding. Final Clinical Impression(s) / ED Diagnoses Final diagnoses:  Hypertension, unspecified type    Rx / DC Orders ED Discharge Orders    None    You were given the first dose of your amlodipine here.  Please restart this as you were taking prior to your hospitalization.  A prescription has been called into the [facility] in [place]. Return to the emergency department if you are worse at any time especially worsening pain, fever, or  weakness. Please follow-up with your doctor on Tuesday as scheduled.    Pattricia Boss, MD 07/18/20 [phone]

## 2020-07-20 ENCOUNTER — Telehealth: Payer: Self-pay

## 2020-07-20 NOTE — Progress Notes (Signed)
Cardiology Office Note:    Date:  07/21/2020   ID:  Angel [name], Angel [name] 08-31-1930, MRN [id number]  PCP:  Danelle Berry, PA-C  [facility] Cardiologist:  Lesleigh Noe, MD   [facility] Electrophysiologist:  None   Referring MD: Danelle Berry, PA-C   Chief Complaint:  Follow-up (Hx of CVA, HTN, CAD)    Patient Profile:    Angel [name] is a 86 y.o. female with:   Presumed CAD ? Hx abnormal nuclear study (according to OV notes) ? ?  Small apical infarct versus diverticulum by cardiac MRI (according to OV notes)  Hypertension  Hyperlipidemia  Aortic stenosis ? Echo 01/2019: EF 60-65, mild aortic stenosis (mean 13 mmHg) ? Echocardiogram 11/21: EF 60-65, Gr 1 DD, mod AS (mean 14 mHg)   Mild dilation of ascending aorta  Hx of CVA 06/2020   Aortic atherosclerosis  Chronic kidney disease   Prior CV studies: Echocardiogram 06/29/2020 EF 60-65, no RWMA, mild LVH, Gr 1 DD, mod LAE, trivial MR, mod AS (mean 14 mmHg, Vmax 248 cm/s, DI 0.35), mild dilation of ascending aorta (38 mm)  Head CTA 06/28/20 IMPRESSION: 1. No large vessel occlusion or high-grade stenosis of the intracranial vasculature. 2. Atherosclerotic changes of the right carotid bifurcation with approximately 50% stenosis. 3. Atherosclerotic changes of the left carotid bifurcation without hemodynamically significant stenosis. 4. 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 Aortic Atherosclerosis (ICD10-I70.0).  Echocardiogram 02/04/2019 EF 60-65, mild LVH, GR 1 DD, mild MAC, mild dilation of ascending aorta (38 mm), mild aortic stenosis (mean 13 mmHg)  Echocardiogram 02/02/2017 EF 60-65, no RWMA, GR 1 DD, mild-moderate aortic stenosis (mean 16 mmHg, peak 23 mmHg), trivial MR, mild LAE, normal RV SF, mild TR  History of Present Illness:    Ms. Asay was last seen by Dr. Katrinka Blazing in 6/21.   She was admitted in 11/21 with a small R pontine infarct 2/2 small vessel disease.  Echocardiogram showed normal EF and mod aortic stenosis.  Head and neck CTA demonstrated approx 50% stenosis in the R common carotid bifurcation and no hemodynamically significant stenosis on the L.  Neurology recommended DAPT for 3 weeks, then ASA alone.  She was then seen in the ED 07/18/20.  Her BP was 205/109.  She complained of jaw pain as well.  Her hs-Troponin levels were normal. She received Labetalol and Hydralazine.  She was started back on Amlodipine.   She returns for f/u.  She is here with her fianc, Angel [name].  She continues no issues with her blood pressure.  She also notes difficulty with anxiety.  Her PCP recently placed her on alprazolam.  This has helped somewhat with anxiety.  She typically feels heaviness in her chest when her blood pressure goes up.  She has not really had exertional symptoms.  She has not had exertional shortness of breath.  She has not had orthopnea, lower extremity swelling or syncope.      Past Medical History:  Diagnosis Date  . Anxiety   . Aortic aneurysm without rupture (HCC)   . CAD (coronary artery disease)   . Chronic hip pain   . Endometriosis   . Hearing loss   . HTN, goal below 150/90   . Hyperlipidemia LDL goal <70 11/18/2014  . Osteopenia of the elderly   . Urge and stress incontinence     Current Medications: Current Meds  Medication Sig  . ALPRAZolam (XANAX) 0.25 MG tablet Take 1 tablet up to twice daily as needed for anxiety episodes.  Use sparingly and not to take it every day.  [name] amLODipine (NORVASC) 5 MG tablet Take 1 tablet (5 mg total) by mouth daily.  . Ascorbic Acid (VITAMIN C) 100 MG tablet Take 500 mg by mouth daily. Gummy 750mg   . aspirin EC 81 MG EC tablet Take 1 tablet (81 mg total) by mouth daily. Swallow whole.  [name] atorvastatin (LIPITOR) 80 MG tablet Take 1 tablet (80 mg total) by mouth daily.  . clopidogrel (PLAVIX) 75 MG tablet Take 1 tablet  (75 mg total) by mouth daily.  [name] glucosamine-chondroitin 500-400 MG tablet Take 1 tablet by mouth daily.  [name] lisinopril (ZESTRIL) 40 MG tablet Take 1 tablet (40 mg total) by mouth daily.  . Multiple Vitamin (MULTIVITAMIN) capsule Take 1 capsule by mouth daily.  . nitroGLYCERIN (NITROSTAT) 0.4 MG SL tablet Place 1 tablet (0.4 mg total) under the tongue every 5 (five) minutes as needed for chest pain.  . Omega-3 Fatty Acids (FISH OIL) 1000 MG CAPS Take by mouth. Krill oil  . vitamin B-12 (CYANOCOBALAMIN) 100 MCG tablet Take 1,000 mcg by mouth daily.   [name] VITAMIN D, ERGOCALCIFEROL, PO Take 1,000 Units by mouth daily.  . [DISCONTINUED] metoprolol succinate (TOPROL-XL) 50 MG 24 hr tablet TAKE 1 & 1/2 TABLET BY MOUTH DAILY TAKE WITH OR IMMEDIATELY FOLLOWING A MEAL (Patient taking differently: Take 75 mg by mouth daily.)     Allergies:   Codeine, Levofloxacin, Myrbetriq [mirabegron], Prednisone, and Sulfa antibiotics   Social History   Tobacco Use  . Smoking status: Never Smoker  . Smokeless tobacco: Never Used  Vaping Use  . Vaping Use: Never used  Substance Use Topics  . Alcohol use: Yes    Alcohol/week: 1.0 standard drink    Types: 1 Cans of beer per week    Comment: rare  . Drug use: No     Family Hx: The patient's family history includes Breast cancer (age of onset: 55) in her maternal grandmother; Cancer in her brother; Heart disease in her maternal grandmother; Stroke in her mother.  Review of Systems  Gastrointestinal: Negative for hematochezia and melena.  Genitourinary: Negative for hematuria.     EKGs/Labs/Other Test Reviewed:    EKG:  EKG is  not ordered today.  The ekg ordered today demonstrates n/a  Recent Labs: 06/28/2020: ALT 28 07/18/2020: BUN 20; Creatinine, Ser 0.60; Hemoglobin 14.2; Platelets 262; Potassium 3.7; Sodium 142   Recent Lipid Panel Lab Results  Component Value Date/Time   CHOL 160 06/29/2020 05:00 AM   CHOL 179 02/13/2019 04:26 PM   TRIG 133  06/29/2020 05:00 AM   HDL 40 (L) 06/29/2020 05:00 AM   HDL 42 02/13/2019 04:26 PM   CHOLHDL 4.0 06/29/2020 05:00 AM   LDLCALC 93 06/29/2020 05:00 AM   LDLCALC 74 02/13/2019 04:26 PM   LDLCALC 82 01/18/2018 02:16 PM      Risk Assessment/Calculations:      Physical Exam:    VS:  BP (!) 144/80   Pulse 92   Ht 5\' 4"  (1.626 m)   Wt 192 lb 12.8 oz (87.5 kg)   LMP  (LMP Unknown)   SpO2 96%   BMI 33.09 kg/m     Wt Readings from Last 3 Encounters:  07/21/20 192 lb 12.8 oz (87.5 kg)  07/18/20 190 lb (86.2 kg)  07/10/20 190 lb 11.2 oz (86.5 kg)  Constitutional:      Appearance: Healthy appearance. Not in distress.  Neck:     Thyroid: No thyromegaly.     Vascular: JVD normal.  Pulmonary:     Effort: Pulmonary effort is normal.     Breath sounds: No wheezing. No rales.  Cardiovascular:     Normal rate. Regular rhythm. Normal S1. Normal S2.     Murmurs: There is a grade 2/6 systolic murmur at the URSB.  Edema:    Peripheral edema absent.  Abdominal:     Palpations: Abdomen is soft.  Skin:    General: Skin is warm and dry.  Neurological:     Mental Status: Alert and oriented to person, place and time.     Cranial Nerves: Cranial nerves are intact.       ASSESSMENT & PLAN:    1. Coronary artery disease involving native coronary artery of native heart with angina pectoris (HCC) History of presumed coronary artery disease.  She had an abnormal Myoview years ago.  She also had an MRI in the past that demonstrated a small apical infarct versus diverticulum.  She has opted for medical management over the years.  She continues to have episodes of chest heaviness.  This typically occurs when her blood pressure is uncontrolled.  We discussed the indications for further testing and what an abnormal test would mean.  She continues to prefer medical management.  Her blood pressure is uncontrolled.  I have recommended that we increase her metoprolol succinate to 100 mg daily.   Continue current dose of amlodipine, aspirin, atorvastatin.  Follow-up 6 weeks.  2. Essential hypertension Blood pressure uncontrolled.  She notes blood pressure readings of 159/104 at home this morning.  Her pressure was as high as 205/109 in the emergency room last week.  Increase metoprolol succinate to 100 mg daily.  Change amlodipine to morning dosing.  She can continue taking lisinopril and metoprolol succinate at night.  I have asked her to monitor her blood pressure and send me readings after 1-2 weeks via MyChart.  If needed, we can further increase metoprolol or amlodipine.  We could also add isosorbide given her anginal symptoms.  3. Hyperlipidemia LDL goal <70 Continue high intensity statin therapy.  4. Nonrheumatic aortic valve stenosis Moderate by recent echocardiogram.  Consider repeat echocardiogram 1 year  5. History of CVA (cerebrovascular accident) She has follow-up with neurology next week.  She remains on clopidogrel and aspirin for now.  She knows that she is to stop Clopidogrel after 3 weeks.          Dispo:  Return in about 6 weeks (around 09/01/2020) for Routine Follow Up w/ Dr. Katrinka Blazing, or Tereso Newcomer, PA-C, in person.   Medication Adjustments/Labs and Tests Ordered: Current medicines are reviewed at length with the patient today.  Concerns regarding medicines are outlined above.  Tests Ordered: No orders of the defined types were placed in this encounter.  Medication Changes: Meds ordered this encounter  Medications  . metoprolol succinate (TOPROL-XL) 100 MG 24 hr tablet    Sig: Take 1 tablet (100 mg total) by mouth daily. Take with or immediately following a meal.    Dispense:  90 tablet    Refill:  3    Signed, Tereso Newcomer, PA-C  07/21/2020 4:58 PM    [facility] [address] [place], [place], [place]  [address] Phone: [phone]; Fax: [phone]

## 2020-07-20 NOTE — Telephone Encounter (Signed)
Copied from [place] [id number]. Topic: Quick Communication - See Telephone Encounter >> Jul 20, 2020 11:06 AM [name] wrote: CRM for notification. See Telephone encounter for: 07/20/20. Nurse w/th  Mallie Mussel request a 1 time a wk for 4 wk nursing visits with pt Her CB # is [phone] leave message as nurse on vacation. Also wanted to update pt was sent to [facility] this weekend by nurse for Hypertension, severe, treated and released. >> Jul 20, 2020  2:03 PM [place], Tawanna Solo, CMA wrote: This was sent to the wrong office

## 2020-07-21 ENCOUNTER — Ambulatory Visit (INDEPENDENT_AMBULATORY_CARE_PROVIDER_SITE_OTHER): Payer: Medicare Other | Admitting: Physician Assistant

## 2020-07-21 ENCOUNTER — Ambulatory Visit: Payer: Medicare Other | Admitting: Physician Assistant

## 2020-07-21 ENCOUNTER — Other Ambulatory Visit: Payer: Self-pay

## 2020-07-21 ENCOUNTER — Encounter: Payer: Self-pay | Admitting: Physician Assistant

## 2020-07-21 VITALS — BP 144/80 | HR 92 | Ht 64.0 in | Wt 192.8 lb

## 2020-07-21 DIAGNOSIS — I10 Essential (primary) hypertension: Secondary | ICD-10-CM

## 2020-07-21 DIAGNOSIS — I350 Nonrheumatic aortic (valve) stenosis: Secondary | ICD-10-CM | POA: Diagnosis not present

## 2020-07-21 DIAGNOSIS — Z8673 Personal history of transient ischemic attack (TIA), and cerebral infarction without residual deficits: Secondary | ICD-10-CM

## 2020-07-21 DIAGNOSIS — I25119 Atherosclerotic heart disease of native coronary artery with unspecified angina pectoris: Secondary | ICD-10-CM

## 2020-07-21 DIAGNOSIS — E785 Hyperlipidemia, unspecified: Secondary | ICD-10-CM | POA: Diagnosis not present

## 2020-07-21 DIAGNOSIS — I208 Other forms of angina pectoris: Secondary | ICD-10-CM

## 2020-07-21 MED ORDER — METOPROLOL SUCCINATE ER 100 MG PO TB24: 100.0000 mg | ORAL_TABLET | Freq: Every day | ORAL | 3 refills | Status: DC

## 2020-07-21 NOTE — Patient Instructions (Addendum)
Medication Instructions:  Your physician has recommended you make the following change in your medication:   1) Increase Metoprolol to 100 mg, 1 tablet by mouth once a day  *If you need a refill on your cardiac medications before your next appointment, please call your pharmacy*  Lab Work: None ordered today  If you have labs (blood work) drawn today and your tests are completely normal, you will receive your results only by: [name] MyChart Message (if you have MyChart) OR . A paper copy in the mail If you have any lab test that is abnormal or we need to change your treatment, we will call you to review the results.  Testing/Procedures: None ordered today  Follow-Up: On 09/02/20 at 11:40PM with Daneen Schick, MD  Other Instructions Check blood pressure 1-2 times a day for 2 weeks and send readings through my chart.

## 2020-07-26 ENCOUNTER — Encounter: Payer: Self-pay | Admitting: Family Medicine

## 2020-07-27 ENCOUNTER — Telehealth: Payer: Self-pay

## 2020-07-27 MED ORDER — AMLODIPINE BESYLATE 10 MG PO TABS: 10.0000 mg | ORAL_TABLET | Freq: Every day | ORAL | 1 refills | Status: DC

## 2020-07-27 NOTE — Telephone Encounter (Signed)
Can This be called and authorized

## 2020-07-27 NOTE — Telephone Encounter (Signed)
Yes

## 2020-07-27 NOTE — Telephone Encounter (Signed)
Increase Amlodipine to 10 mg once daily. She will need to discuss anxiolytic medications with her PCP. Arrange visit with HTN clinic in 2-3 weeks. Richardson Dopp, PA-C    07/27/2020 5:14 PM

## 2020-07-27 NOTE — Telephone Encounter (Signed)
I called and left Trenda Moots a detailed message with Trinidad Curet instructions. I also sent a my chart message as well and advised to call with any questions.

## 2020-07-28 ENCOUNTER — Ambulatory Visit (INDEPENDENT_AMBULATORY_CARE_PROVIDER_SITE_OTHER): Payer: Medicare Other | Admitting: Adult Health

## 2020-07-28 ENCOUNTER — Encounter: Payer: Self-pay | Admitting: Adult Health

## 2020-07-28 VITALS — BP 166/80 | HR 83 | Ht 64.0 in | Wt 193.0 lb

## 2020-07-28 DIAGNOSIS — I10 Essential (primary) hypertension: Secondary | ICD-10-CM | POA: Diagnosis not present

## 2020-07-28 DIAGNOSIS — I6350 Cerebral infarction due to unspecified occlusion or stenosis of unspecified cerebral artery: Secondary | ICD-10-CM

## 2020-07-28 DIAGNOSIS — F418 Other specified anxiety disorders: Secondary | ICD-10-CM

## 2020-07-28 DIAGNOSIS — E785 Hyperlipidemia, unspecified: Secondary | ICD-10-CM | POA: Diagnosis not present

## 2020-07-28 NOTE — Progress Notes (Signed)
[facility] [address] [place]. [place] [address] [phone]       [place]  Angel [name] Date of Birth:  1931-03-11 Medical Record Number:  [id number]   Reason for Referral:  hospital stroke follow up    SUBJECTIVE:   CHIEF COMPLAINT:  Chief Complaint  Patient presents with  . Hospitalization Follow-up    Rm 9, with husband, concerned about BP     HPI:   Angel [name] is a 86 y.o. female with history of HTN, HLD, and CAD who presented on 06/28/2020 with unsteady gait w/ dizziness.  Personally reviewed hospitalization pertinent progress notes, lab work and imaging with summary provided.  Evaluated by Dr. Leonie Man with stroke work-up revealing small right pontine infarct secondary to small vessel disease.  Recommended DAPT for 3 weeks and aspirin alone.  HTN stable.  LDL 93 and increased home dose atorvastatin from 40 mg to 80 mg daily.  Other stroke risk factors include advanced age, EtOH use, obesity, family history of stroke and CAD.  No prior stroke history.  Other active problems include AAA.  Stroke:   Small R pontine infarct secondary to small vessel disease  CT head No acute abnormality. Small vessel disease. Atrophy.   MRI  ? Dorsal R parasagittal pontine infarct. Small vessel disease.   CTA head & neck no LVO. R ICA w/ 50% stenosis. L ICA atherosclerosis. 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  2D Echo EF 60-65%. No source of embolus. LA moderately dilated  LDL 93  HgbA1c 6.2  VTE prophylaxis - Lovenox 40 mg sq daily   No antithrombotic prior to admission, now on aspirin 325 mg daily and clopidogrel 75 mg daily following plavix load. Will decrease aspirin to 81 and continue DAPT x 3 weeks then aspirin alone  Therapy recommendations:  HH PT  Disposition:  Return home w/ [place]  Today, 07/28/2020, [name] is being seen for hospital follow-up accompanied by her husband. She has  recovered well from a stroke standpoint without residual deficits and denies new or recurring stroke/TIA symptoms. She does report increased anxiety since discharge more in regards to her blood pressure. PCP prescribed xanax which she reports has provided some benefit.  Per patient, anxiety triggered by checking blood pressure at home as she is fearful that he will be increased.  Cardiology recently made adjustments to BP med regimen but she just started yesterday.  She was evaluated in the ED on 07/18/2020 for elevated BP at 205/109 treated with labetalol and hydralazine and restarted amlodipine as this was discontinued during stroke admission.  She has completed 3 months DAPT and remains on aspirin alone without bleeding or bruising.  Remains on atorvastatin daily without myalgias.  Blood pressure today 166/80.  No further concerns.    ROS:   14 system review of systems performed and negative with exception of spinning sensation, easy bruising, increased thirst, joint pain, aching muscles, incontinence, anxiety and decreased energy  PMH:  Past Medical History:  Diagnosis Date  . Anxiety   . Aortic aneurysm without rupture ([place])   . CAD (coronary artery disease)   . Chronic hip pain   . Endometriosis   . Hearing loss   . HTN, goal below 150/90   . Hyperlipidemia LDL goal <70 11/18/2014  . Osteopenia of the elderly   . Urge and stress incontinence     PSH:  Past Surgical History:  Procedure Laterality Date  . ABDOMINAL HYSTERECTOMY  1976   total  . APPENDECTOMY    . BREAST EXCISIONAL BIOPSY Left 1987   benign  . CATARACT EXTRACTION, BILATERAL  08/2018   Dr. Manuella Ghazi with [facility] History:  Social History   Socioeconomic History  . Marital status: Widowed    Spouse name: Not on file  . Number of children: 3  . Years of education: Not on file  . Highest education level: Not on file  Occupational History  . Not on file  Tobacco Use  . Smoking status: Never  Smoker  . Smokeless tobacco: Never Used  Vaping Use  . Vaping Use: Never used  Substance and Sexual Activity  . Alcohol use: Yes    Alcohol/week: 1.0 standard drink    Types: 1 Cans of beer per week    Comment: rare  . Drug use: No  . Sexual activity: Not on file  Other Topics Concern  . Not on file  Social History Narrative  . Not on file   Social Determinants of Health   Financial Resource Strain: Low Risk   . Difficulty of Paying Living Expenses: Not hard at all  Food Insecurity: No Food Insecurity  . Worried About Charity fundraiser in the Last Year: Never true  . Ran Out of Food in the Last Year: Never true  Transportation Needs: No Transportation Needs  . Lack of Transportation (Medical): No  . Lack of Transportation (Non-Medical): No  Physical Activity: Insufficiently Active  . Days of Exercise per Week: 7 days  . Minutes of Exercise per Session: 20 min  Stress: No Stress Concern Present  . Feeling of Stress : Only a little  Social Connections: Unknown  . Frequency of Communication with Friends and Family: Patient refused  . Frequency of Social Gatherings with Friends and Family: Patient refused  . Attends Religious Services: Patient refused  . Active Member of Clubs or Organizations: Patient refused  . Attends Archivist Meetings: Patient refused  . Marital Status: Widowed  Intimate Partner Violence: Not At Risk  . Fear of Current or Ex-Partner: No  . Emotionally Abused: No  . Physically Abused: No  . Sexually Abused: No    Family History:  Family History  Problem Relation Age of Onset  . Stroke Mother   . Breast cancer Maternal Grandmother 60  . Heart disease Maternal Grandmother   . Cancer Brother        skin    Medications:   Current Outpatient Medications on File Prior to Visit  Medication Sig Dispense Refill  . ALPRAZolam (XANAX) 0.25 MG tablet Take 1 tablet up to twice daily as needed for anxiety episodes.  Use sparingly and not to  take it every day. 20 tablet 1  . amLODipine (NORVASC) 10 MG tablet Take 1 tablet (10 mg total) by mouth daily. 90 tablet 1  . Ascorbic Acid (VITAMIN C) 100 MG tablet Take 500 mg by mouth daily. Gummy 750mg     . aspirin EC 81 MG EC tablet Take 1 tablet (81 mg total) by mouth daily. Swallow whole. 30 tablet 11  . atorvastatin (LIPITOR) 80 MG tablet Take 1 tablet (80 mg total) by mouth daily. 30 tablet 0  . glucosamine-chondroitin 500-400 MG tablet Take 1 tablet by mouth daily.    [name] lisinopril (ZESTRIL) 40 MG tablet Take 1 tablet (40 mg total) by mouth daily.    . metoprolol succinate (TOPROL-XL)  100 MG 24 hr tablet Take 1 tablet (100 mg total) by mouth daily. Take with or immediately following a meal. (Patient taking differently: Take 100 mg by mouth in the morning and at bedtime. Take with or immediately following a meal.) 90 tablet 3  . Multiple Vitamin (MULTIVITAMIN) capsule Take 1 capsule by mouth daily.    . Omega-3 Fatty Acids (FISH OIL) 1000 MG CAPS Take by mouth. Krill oil    . vitamin B-12 (CYANOCOBALAMIN) 100 MCG tablet Take 1,000 mcg by mouth daily.     [name] VITAMIN D, ERGOCALCIFEROL, PO Take 1,000 Units by mouth daily.    . nitroGLYCERIN (NITROSTAT) 0.4 MG SL tablet Place 1 tablet (0.4 mg total) under the tongue every 5 (five) minutes as needed for chest pain. 25 tablet 3   No current facility-administered medications on file prior to visit.    Allergies:   Allergies  Allergen Reactions  . Codeine Other (See Comments)    "zoned out"  . Levofloxacin Other (See Comments)    AMS  . Myrbetriq [Mirabegron] Other (See Comments)    Severe depression  . Prednisone Other (See Comments)    AMS  . Sulfa Antibiotics Other (See Comments)    AMS      OBJECTIVE:  Physical Exam  Vitals:   07/28/20 1411  BP: (!) 166/80  Pulse: 83  Weight: 193 lb (87.5 kg)  Height: 5\' 4"  (1.626 m)   Body mass index is 33.13 kg/m. No exam data present  General: well developed, well nourished,   pleasant elderly Caucasian female, seated, in no evident distress Head: head normocephalic and atraumatic.   Neck: supple with no carotid or supraclavicular bruits Cardiovascular: regular rate and rhythm, no murmurs Musculoskeletal: no deformity Skin:  no rash/petichiae Vascular:  Normal pulses all extremities   Neurologic Exam Mental Status: Awake and fully alert.   Fluent speech and language.  Oriented to place and time. Recent and remote memory intact. Attention span, concentration and fund of knowledge appropriate. Mood and affect appropriate.  Cranial Nerves: Fundoscopic exam reveals sharp disc margins. Pupils equal, briskly reactive to light. Extraocular movements full without nystagmus. Visual fields full to confrontation. Hearing intact. Facial sensation intact. Face, tongue, palate moves normally and symmetrically.  Motor: Normal bulk and tone. Normal strength in all tested extremity muscles Sensory.: intact to touch , pinprick , position and vibratory sensation.  Coordination: Rapid alternating movements normal in all extremities. Finger-to-nose and heel-to-shin performed accurately bilaterally. Gait and Station: Arises from chair without difficulty. Stance is normal. Gait demonstrates  wide-based gait without evidence of imbalance or unsteadiness. Reflexes: 1+ and symmetric. Toes downgoing.     NIHSS  0 Modified Rankin  0      ASSESSMENT: Angel [name] is a 86 y.o. year old female presented with unsteady gait and dizziness on 06/28/2020 with stroke work-up revealing small right pontine infarct secondary to small vessel disease. Vascular risk factors include HTN, HLD, carotid stenosis, advanced age, EtOH use, family history of stroke and CAD.      PLAN:  1. R pontine stroke:  a. Recovered well without residual deficit.  She has been having difficulties with anxiety but more so related to blood pressure concerns.   b. Continue aspirin 81 mg daily  and atorvastatin 80 mg  daily for secondary stroke prevention.  c.  Discussed secondary stroke prevention measures and importance of close PCP follow up for aggressive stroke risk factor management  2. HTN: BP goal <130/90. [place] blood  pressure readings with recent adjustments made by cardiology.  Advised to continue to monitor at home and routine follow-up with cardiology 3. HLD: LDL goal <70. Recent LDL 93 on atorvastatin 40 mg daily -increase to 80 mg daily during recent stroke admission 4. Anxiety: Triggered when checking blood pressure which is likely contributing to difficulty to control blood pressure.  Discussed ongoing use of Xanax and she may do better with a different type of medication such as SSRI which was deferred to PCP.  She is well-known to meditation and encouraged doing this frequently as well as performing prior to checking blood pressure    Follow up in 4 months or call earlier if needed   CC:  [place] provider: Dr. Jolaine Artist, Kristeen Miss, PA-C    I spent 50 minutes of face-to-face and non-face-to-face time with patient and husband.  This included previsit chart review including hospitalization pertinent progress notes, lab work and imaging, lab review, study review, order entry, electronic health record documentation, patient and husband discussion/education regarding recent stroke, anxiety difficulties, importance of managing stroke risk factors and answered all other questions to patient and husband satisfaction   Frann Rider, AGNP-BC  [facility] [address] [place], [place] [address]  Phone [phone] Fax [phone] Note: This document was prepared with digital dictation and possible smart phrase technology. Any transcriptional errors that result from this process are unintentional.

## 2020-07-28 NOTE — Patient Instructions (Signed)
Continue aspirin 81 mg daily  and atorvastatin  for secondary stroke prevention  Continue to follow with cardiology for blood pressure management and monitoring  Continue to follow up with PCP regarding cholesterol and blood pressure management  Maintain strict control of hypertension with blood pressure goal below 130/90 and cholesterol with LDL cholesterol (bad cholesterol) goal below 70 mg/dL.      Followup in the future with me in 4 months or call earlier if needed      Thank you for coming to see [place] at [facility]. I hope we have been able to provide you high quality care today.  You may receive a patient satisfaction survey over the next few weeks. We would appreciate your feedback and comments so that we may continue to improve ourselves and the health of our patients.

## 2020-07-28 NOTE — Telephone Encounter (Signed)
Tried calling nurse Diane with [facility] to give verbal authorization for patient to have nursing visits 1 time per week for 4 weeks, but no answer and voicemail box full.

## 2020-08-04 ENCOUNTER — Telehealth: Payer: Self-pay | Admitting: *Deleted

## 2020-08-04 ENCOUNTER — Institutional Professional Consult (permissible substitution): Payer: Medicare Other | Admitting: Family Medicine

## 2020-08-04 NOTE — Chronic Care Management (AMB) (Signed)
  Care Management   Note  08/04/2020 Name: [name] MRN: [id number] DOB: Jun 02, 1931  Angel [name] is a 86 y.o. year old female who is a primary care patient of Sander [facility]on and is actively engaged with the care management team. I reached out to [name] by phone today to assist with re-scheduling a follow up visit with the Pharmacist  Follow up plan: Unsuccessful telephone outreach attempt made. A HIPAA compliant phone message was left for the patient providing contact information and requesting a return call. The care management team will reach out to the patient again over the next 7 days. If patient returns call to provider office, please advise to call Embedded Care Management Care Guide Gwenevere Ghazi at [phone].  Gwenevere Ghazi  Care Guide, Embedded Care Coordination [facility] Management  Direct Dial: [phone]

## 2020-08-05 ENCOUNTER — Telehealth: Payer: Self-pay | Admitting: Adult Health

## 2020-08-05 NOTE — Telephone Encounter (Signed)
Pt. Called wanting to know if she was able to drive yet.  Best call back is [phone].

## 2020-08-05 NOTE — Telephone Encounter (Signed)
LMVM for pt to return call.  No mention of driving in last ofv note.

## 2020-08-06 DIAGNOSIS — M19072 Primary osteoarthritis, left ankle and foot: Secondary | ICD-10-CM | POA: Diagnosis not present

## 2020-08-06 DIAGNOSIS — I700 Atherosclerosis of aorta: Secondary | ICD-10-CM | POA: Diagnosis not present

## 2020-08-06 DIAGNOSIS — M19071 Primary osteoarthritis, right ankle and foot: Secondary | ICD-10-CM | POA: Diagnosis not present

## 2020-08-06 DIAGNOSIS — Z7982 Long term (current) use of aspirin: Secondary | ICD-10-CM | POA: Diagnosis not present

## 2020-08-06 DIAGNOSIS — R2689 Other abnormalities of gait and mobility: Secondary | ICD-10-CM | POA: Diagnosis not present

## 2020-08-06 DIAGNOSIS — I2510 Atherosclerotic heart disease of native coronary artery without angina pectoris: Secondary | ICD-10-CM | POA: Diagnosis not present

## 2020-08-06 DIAGNOSIS — E785 Hyperlipidemia, unspecified: Secondary | ICD-10-CM | POA: Diagnosis not present

## 2020-08-06 DIAGNOSIS — M170 Bilateral primary osteoarthritis of knee: Secondary | ICD-10-CM | POA: Diagnosis not present

## 2020-08-06 DIAGNOSIS — M47812 Spondylosis without myelopathy or radiculopathy, cervical region: Secondary | ICD-10-CM | POA: Diagnosis not present

## 2020-08-06 DIAGNOSIS — I083 Combined rheumatic disorders of mitral, aortic and tricuspid valves: Secondary | ICD-10-CM | POA: Diagnosis not present

## 2020-08-06 DIAGNOSIS — G319 Degenerative disease of nervous system, unspecified: Secondary | ICD-10-CM | POA: Diagnosis not present

## 2020-08-06 DIAGNOSIS — Z9181 History of falling: Secondary | ICD-10-CM | POA: Diagnosis not present

## 2020-08-06 DIAGNOSIS — Z7902 Long term (current) use of antithrombotics/antiplatelets: Secondary | ICD-10-CM | POA: Diagnosis not present

## 2020-08-06 DIAGNOSIS — I77819 Aortic ectasia, unspecified site: Secondary | ICD-10-CM | POA: Diagnosis not present

## 2020-08-06 DIAGNOSIS — I69398 Other sequelae of cerebral infarction: Secondary | ICD-10-CM | POA: Diagnosis not present

## 2020-08-06 DIAGNOSIS — I119 Hypertensive heart disease without heart failure: Secondary | ICD-10-CM | POA: Diagnosis not present

## 2020-08-06 NOTE — Telephone Encounter (Signed)
I called pt.  She states that she has not been driving of her own accord for 7-8- wks.  She has been getting PT from [facility], which she says she is doing well and from there prespective she can drive.  She is satisfying her caregiver in asking this from [place].  This was not addressed when she was in the office with you recently (forget to mention).  Please advise your thoughts.  She is aware you are out of office this week and will be addressed next week.

## 2020-08-07 DIAGNOSIS — I2510 Atherosclerotic heart disease of native coronary artery without angina pectoris: Secondary | ICD-10-CM | POA: Diagnosis not present

## 2020-08-07 DIAGNOSIS — M19071 Primary osteoarthritis, right ankle and foot: Secondary | ICD-10-CM | POA: Diagnosis not present

## 2020-08-07 DIAGNOSIS — M170 Bilateral primary osteoarthritis of knee: Secondary | ICD-10-CM | POA: Diagnosis not present

## 2020-08-07 DIAGNOSIS — I69398 Other sequelae of cerebral infarction: Secondary | ICD-10-CM | POA: Diagnosis not present

## 2020-08-07 DIAGNOSIS — R2689 Other abnormalities of gait and mobility: Secondary | ICD-10-CM | POA: Diagnosis not present

## 2020-08-07 DIAGNOSIS — M19072 Primary osteoarthritis, left ankle and foot: Secondary | ICD-10-CM | POA: Diagnosis not present

## 2020-08-10 ENCOUNTER — Telehealth: Payer: Self-pay | Admitting: Family Medicine

## 2020-08-10 NOTE — Telephone Encounter (Signed)
She was seen in office, she did not have any residual stroke deficits.  No concern of her returning to drive from a neurological standpoint

## 2020-08-10 NOTE — Telephone Encounter (Signed)
Diane rn with  [facility] is calling  and would like to request one more skill nursing visit on 08-27-2020

## 2020-08-10 NOTE — Telephone Encounter (Signed)
I let pt know by mychart as her phone busy.

## 2020-08-11 ENCOUNTER — Ambulatory Visit: Payer: Medicare Other | Admitting: Family Medicine

## 2020-08-11 NOTE — Chronic Care Management (AMB) (Signed)
  Care Management   Note  08/11/2020 Name: Angel [name] MRN: [id number] DOB: May 04, 1931  [name] is a 87 y.o. year old female who is a primary care patient of No primary care provider on file. and is actively engaged with the care management team. I reached out to [name] by phone today to assist with re-scheduling a follow up visit with the Pharmacist  Follow up plan: Patient declines further follow up and engagement by the care management team. Appropriate care team members and provider have been notified via electronic communication.  Patient has changed providers to [facility] family practice Hetty Blend, NP.  Gwenevere Ghazi  Care Guide, Embedded Care Coordination [facility] Management  Direct Dial: [phone]

## 2020-08-12 ENCOUNTER — Telehealth: Payer: Self-pay

## 2020-08-12 NOTE — Telephone Encounter (Signed)
Copied from CRM [id number]. Topic: General - Other >> Aug 12, 2020 12:12 PM Dalphine Handing A wrote:  Frances Furbish called for approval on Verbal orders for  pt 1w4. Best contact [phone]

## 2020-08-13 ENCOUNTER — Ambulatory Visit: Payer: Medicare Other

## 2020-08-13 NOTE — Telephone Encounter (Signed)
Left message for Diane at [facility] for 1 more visit on 08/28/19

## 2020-08-14 ENCOUNTER — Encounter: Payer: Self-pay | Admitting: Family Medicine

## 2020-08-14 ENCOUNTER — Ambulatory Visit (INDEPENDENT_AMBULATORY_CARE_PROVIDER_SITE_OTHER): Payer: Medicare Other | Admitting: Family Medicine

## 2020-08-14 ENCOUNTER — Other Ambulatory Visit: Payer: Self-pay

## 2020-08-14 VITALS — BP 138/90 | HR 66 | Ht 63.0 in | Wt 197.6 lb

## 2020-08-14 DIAGNOSIS — I719 Aortic aneurysm of unspecified site, without rupture: Secondary | ICD-10-CM | POA: Diagnosis not present

## 2020-08-14 DIAGNOSIS — R7303 Prediabetes: Secondary | ICD-10-CM

## 2020-08-14 DIAGNOSIS — G4700 Insomnia, unspecified: Secondary | ICD-10-CM

## 2020-08-14 DIAGNOSIS — E785 Hyperlipidemia, unspecified: Secondary | ICD-10-CM | POA: Diagnosis not present

## 2020-08-14 DIAGNOSIS — F418 Other specified anxiety disorders: Secondary | ICD-10-CM | POA: Diagnosis not present

## 2020-08-14 DIAGNOSIS — I25119 Atherosclerotic heart disease of native coronary artery with unspecified angina pectoris: Secondary | ICD-10-CM

## 2020-08-14 DIAGNOSIS — I10 Essential (primary) hypertension: Secondary | ICD-10-CM

## 2020-08-14 DIAGNOSIS — M8580 Other specified disorders of bone density and structure, unspecified site: Secondary | ICD-10-CM | POA: Diagnosis not present

## 2020-08-14 NOTE — Patient Instructions (Addendum)
It was a pleasure meeting you today.   For sleep: Try Melatonin 3 mg (30-60 minutes before going to bed) for 1 week. If this does not help, you may increase the dose to 6 mg.   Continue on your current medications.  I will be in touch regarding a substitute for alprazolam.   I recommend having a conversation with your orthopedist or podiatrist regarding knee, ankle and foot pain.   Follow up in 1-2 months as needed.

## 2020-08-14 NOTE — Progress Notes (Signed)
Subjective:    Patient ID: Angel [name], female    DOB: Nov 23, 1930, 87 y.o.   MRN: [id number]  HPI Chief Complaint  Patient presents with  . new pt     New pt get established. Recent stroke, after effects of HTN, use of xanax, concern for mobility   She is new to the practice and here to establish care. Her significant other is with her.  Previous medical care: PCPs Delsa Grana and Dr. Emilie Rutter   Other providers: Neurologist- Frann Rider, NP Cardiologist- Dr. Tamala Julian and Dr. Kathlen Mody recently  Orthopedist- Dr. Meridee Score  Recent CVA without residual effects.  On aspirin alone now. Doing well.   Anxiety- states she was prescribed alprazolam to take sparingly and has been taking 1-2 tablets per week. She is open to taking a different medication for anxiety as needed but prefers to not take a daily medication.  She is using meditation and other non pharmacological methods for her anxiety and stress.  Stress includes running a wedding venue.   Concerns regarding insomnia. Reports she has issues staying asleep. Only taking 20-30 minutes naps in the afternoon.  No recent alcohol.    HTN- states BP has leveled out now that she is back on amlodipine 10 mg Walking more  Dong PT with [place]  States she was having dizziness with the spike in BP but no recent dizziness since BP under better control.    HL and CAD- taking high intensity atorvastatin  Followed by cardiologist and has upcoming visit scheduled.   Prediabetes with Hgb A1c 6.2% on 06/29/20  Osteopenia per record. She is aware.   Vitamin D - she is taking a supplement, appears to be taking 6,000 IUs daily   Obesity-states she is considering using Noom  Is interested in starting aquatic workouts  History of osteoarthritis - states she has been using Voltaren gel on her feet and ankles and knees.   Pes planus - uses orthotics   Denies fever, chills, dizziness, chest pain, palpitations, orthopnea,  shortness of breath, cough, abdominal pain, N/V/D, urinary symptoms.     Reviewed allergies, medications, past medical, surgical, family, and social history.     Review of Systems Pertinent positives and negatives in the history of present illness.     Objective:   Physical Exam BP 138/90   Pulse 66   Ht 5\' 3"  (1.6 m)   Wt 197 lb 9.6 oz (89.6 kg)   LMP  (LMP Unknown)   SpO2 98%   BMI 35.00 kg/m   Alert and oriented and in no distress.  Cardiac exam shows a regular rhythm without murmurs or gallops. Lungs are clear to auscultation. LE with trace edema bilaterally. Skin is warm and dry. Normal speech, mood and memory.        Assessment & Plan:  Hypertension goal BP (blood pressure) < 140/90 -she is a pleasant 87 year old female who is new to me today.  BP is at goal. Continue current medications and low sodium diet. Follow with cardiology as scheduled.   Hyperlipidemia LDL goal <70 -continue statin therapy and follow up with cardiology.   Situational anxiety -she is taking alprazolam sparingly and aware this medication is not for long term use. We discussed switching her to a different class of prn anxiety medication. Continue with meditation and other forms of relaxation.   Coronary artery disease involving native coronary artery of native heart with angina pectoris ([place]) -continue high intensity statin. Follow up  with cardiology  Aortic aneurysm without rupture, unspecified portion of aorta [facility]) Reviewed records.   Osteopenia, unspecified location -continue with adequate calcium, vitamin D and get weight bearing exercise. She is interested in aquatic therapy.   Insomnia, unspecified type -counseling on good sleep hygiene. Recommend trying melatonin 3 mg and may increase to 6 mg after a few nights if not getting better sleep. Consider Remeron in future.   Prediabetes -recommend low carbohydrate diet and increasing physical activity as tolerated. We will monitor.

## 2020-08-16 DIAGNOSIS — R7303 Prediabetes: Secondary | ICD-10-CM | POA: Insufficient documentation

## 2020-08-17 ENCOUNTER — Telehealth: Payer: Self-pay | Admitting: Internal Medicine

## 2020-08-17 DIAGNOSIS — M9903 Segmental and somatic dysfunction of lumbar region: Secondary | ICD-10-CM | POA: Diagnosis not present

## 2020-08-17 DIAGNOSIS — M5136 Other intervertebral disc degeneration, lumbar region: Secondary | ICD-10-CM | POA: Diagnosis not present

## 2020-08-17 DIAGNOSIS — M9901 Segmental and somatic dysfunction of cervical region: Secondary | ICD-10-CM | POA: Diagnosis not present

## 2020-08-17 DIAGNOSIS — M62830 Muscle spasm of back: Secondary | ICD-10-CM | POA: Diagnosis not present

## 2020-08-17 NOTE — Telephone Encounter (Signed)
Left message for pt to call me back  Per vickie she is ok to continue xanax if only taking 1-2 times a week as needed. Other option would be too sedating and not as effective. If taking xanax more than 1-2 times a week, need to discuss with vickie

## 2020-08-20 DIAGNOSIS — M170 Bilateral primary osteoarthritis of knee: Secondary | ICD-10-CM | POA: Diagnosis not present

## 2020-08-20 DIAGNOSIS — M19072 Primary osteoarthritis, left ankle and foot: Secondary | ICD-10-CM | POA: Diagnosis not present

## 2020-08-20 DIAGNOSIS — I2510 Atherosclerotic heart disease of native coronary artery without angina pectoris: Secondary | ICD-10-CM | POA: Diagnosis not present

## 2020-08-20 DIAGNOSIS — I69398 Other sequelae of cerebral infarction: Secondary | ICD-10-CM | POA: Diagnosis not present

## 2020-08-20 DIAGNOSIS — R2689 Other abnormalities of gait and mobility: Secondary | ICD-10-CM | POA: Diagnosis not present

## 2020-08-20 DIAGNOSIS — M19071 Primary osteoarthritis, right ankle and foot: Secondary | ICD-10-CM | POA: Diagnosis not present

## 2020-08-21 DIAGNOSIS — R2689 Other abnormalities of gait and mobility: Secondary | ICD-10-CM | POA: Diagnosis not present

## 2020-08-21 DIAGNOSIS — I2510 Atherosclerotic heart disease of native coronary artery without angina pectoris: Secondary | ICD-10-CM | POA: Diagnosis not present

## 2020-08-21 DIAGNOSIS — I69398 Other sequelae of cerebral infarction: Secondary | ICD-10-CM | POA: Diagnosis not present

## 2020-08-21 DIAGNOSIS — M170 Bilateral primary osteoarthritis of knee: Secondary | ICD-10-CM | POA: Diagnosis not present

## 2020-08-21 DIAGNOSIS — M19072 Primary osteoarthritis, left ankle and foot: Secondary | ICD-10-CM | POA: Diagnosis not present

## 2020-08-21 DIAGNOSIS — M19071 Primary osteoarthritis, right ankle and foot: Secondary | ICD-10-CM | POA: Diagnosis not present

## 2020-08-25 ENCOUNTER — Telehealth: Payer: Self-pay

## 2020-08-26 ENCOUNTER — Ambulatory Visit: Payer: Medicare Other | Admitting: Family Medicine

## 2020-08-27 DIAGNOSIS — M170 Bilateral primary osteoarthritis of knee: Secondary | ICD-10-CM | POA: Diagnosis not present

## 2020-08-27 DIAGNOSIS — I69398 Other sequelae of cerebral infarction: Secondary | ICD-10-CM | POA: Diagnosis not present

## 2020-08-27 DIAGNOSIS — M19071 Primary osteoarthritis, right ankle and foot: Secondary | ICD-10-CM | POA: Diagnosis not present

## 2020-08-27 DIAGNOSIS — R2689 Other abnormalities of gait and mobility: Secondary | ICD-10-CM | POA: Diagnosis not present

## 2020-08-27 DIAGNOSIS — I2510 Atherosclerotic heart disease of native coronary artery without angina pectoris: Secondary | ICD-10-CM | POA: Diagnosis not present

## 2020-08-27 DIAGNOSIS — M19072 Primary osteoarthritis, left ankle and foot: Secondary | ICD-10-CM | POA: Diagnosis not present

## 2020-08-31 DIAGNOSIS — M9903 Segmental and somatic dysfunction of lumbar region: Secondary | ICD-10-CM | POA: Diagnosis not present

## 2020-08-31 DIAGNOSIS — M62830 Muscle spasm of back: Secondary | ICD-10-CM | POA: Diagnosis not present

## 2020-08-31 DIAGNOSIS — M9901 Segmental and somatic dysfunction of cervical region: Secondary | ICD-10-CM | POA: Diagnosis not present

## 2020-08-31 DIAGNOSIS — M5136 Other intervertebral disc degeneration, lumbar region: Secondary | ICD-10-CM | POA: Diagnosis not present

## 2020-09-01 DIAGNOSIS — M6281 Muscle weakness (generalized): Secondary | ICD-10-CM | POA: Diagnosis not present

## 2020-09-01 DIAGNOSIS — M170 Bilateral primary osteoarthritis of knee: Secondary | ICD-10-CM | POA: Diagnosis not present

## 2020-09-01 NOTE — Progress Notes (Signed)
Cardiology Office Note:    Date:  09/02/2020   ID:  Angel [name] [birth year], MRN [id number]  PCP:  Girtha Rm, NP-C  Cardiologist:  Sinclair Grooms, MD   Referring MD: Delsa Grana, PA-C   Chief Complaint  Patient presents with  . Hypertension  . Cerebrovascular Accident    History of Present Illness:    Angel [name] is a 87 y.o. female with a hx of poor R-wave progression on EKG, prior history of abnormal myocardial perfusion study, question of small apical infarct or diverticulum by cardiac MRI,essentialhypertension, hyperlipidemia, aortic stenosisandaortic enlargement. CVA November/21 2021 left pontine small vessel disease  In November, developed difficulty with gait involving her left leg.  Also simultaneously having some tightness in her chest.  Evaluation identified an acute pontine stroke related to microvascular disease.  Her blood pressure was extremely high and seem to correlate with the chest pressure.  Once blood pressure was better controlled, chest pressure resolved.  She was still having elevated blood pressure when seen by Richardson Dopp July 21, 2020.  At that time additional antihypertensive therapy intensification was carried out with good result and improvement in blood pressure.  Metoprolol succinate was increased 100 mg/day.  Amlodipine was initially discontinued after hospital discharge, but she is now on 10 mg/day.  In the timeframe since her last office visit in mid December, bilateral lower extremity edema and erythema of her lower extremities has occurred.  No recurrence of neurological symptoms.  Past Medical History:  Diagnosis Date  . Anxiety   . Aortic aneurysm without rupture ([place])   . CAD (coronary artery disease)   . Chronic hip pain   . Endometriosis   . Hearing loss   . HTN, goal below 150/90   . Hyperlipidemia LDL goal <70 11/18/2014  . Osteopenia of the elderly   . Urge and stress incontinence     Past Surgical History:   Procedure Laterality Date  . ABDOMINAL HYSTERECTOMY  1976   total  . APPENDECTOMY    . BREAST EXCISIONAL BIOPSY Left 1987   benign  . CATARACT EXTRACTION, BILATERAL  08/2018   Dr. Manuella Ghazi with [facility]    Current Medications: Current Meds  Medication Sig  . ALPRAZolam (XANAX) 0.25 MG tablet Take 1 tablet up to twice daily as needed for anxiety episodes.  Use sparingly and not to take it every day.  [name] amLODipine (NORVASC) 5 MG tablet Take 1 tablet (5 mg total) by mouth daily.  . Ascorbic Acid (VITAMIN C) 100 MG tablet Take 500 mg by mouth daily. Gummy 750mg   . aspirin EC 81 MG EC tablet Take 1 tablet (81 mg total) by mouth daily. Swallow whole.  [name] atorvastatin (LIPITOR) 80 MG tablet Take 1 tablet (80 mg total) by mouth daily.  . chlorthalidone (HYGROTON) 25 MG tablet Take 0.5 tablets (12.5 mg total) by mouth daily.  [name] glucosamine-chondroitin 500-400 MG tablet Take 1 tablet by mouth daily.  [name] lisinopril (ZESTRIL) 40 MG tablet Take 1 tablet (40 mg total) by mouth daily.  . metoprolol succinate (TOPROL-XL) 100 MG 24 hr tablet Take 1 tablet (100 mg total) by mouth daily. Take with or immediately following a meal.  . Multiple Vitamin (MULTIVITAMIN) capsule Take 1 capsule by mouth daily.  . nitroGLYCERIN (NITROSTAT) 0.4 MG SL tablet Place 1 tablet (0.4 mg total) under the tongue every 5 (five) minutes as needed for chest pain.  . Omega-3 Fatty Acids (FISH OIL) 1000 MG CAPS  Take by mouth. Krill oil  . Probiotic Product (PROBIOTIC DAILY PO) Take by mouth.  . vitamin B-12 (CYANOCOBALAMIN) 100 MCG tablet Take 1,000 mcg by mouth daily.   [name] VITAMIN D, ERGOCALCIFEROL, PO Take 1,000 Units by mouth daily.  . [DISCONTINUED] amLODipine (NORVASC) 10 MG tablet Take 1 tablet (10 mg total) by mouth daily.     Allergies:   Codeine, Levofloxacin, Myrbetriq [mirabegron], Prednisone, and Sulfa antibiotics   Social History   Socioeconomic History  . Marital status: Significant Other    Spouse  name: Not on file  . Number of children: 3  . Years of education: Not on file  . Highest education level: Not on file  Occupational History  . Not on file  Tobacco Use  . Smoking status: Never Smoker  . Smokeless tobacco: Never Used  Vaping Use  . Vaping Use: Never used  Substance and Sexual Activity  . Alcohol use: Yes    Alcohol/week: 1.0 standard drink    Types: 1 Cans of beer per week    Comment: rare  . Drug use: No  . Sexual activity: Not on file  Other Topics Concern  . Not on file  Social History Narrative  . Not on file   Social Determinants of Health   Financial Resource Strain: Not on file  Food Insecurity: Not on file  Transportation Needs: Not on file  Physical Activity: Not on file  Stress: Not on file  Social Connections: Not on file     Family History: The patient's family history includes Breast cancer (age of onset: 23) in her maternal grandmother; Cancer in her brother; Heart disease in her maternal grandmother; Stroke in her mother.  ROS:   Please see the history of present illness.    She has had some constipation.  She denies headache.  Bilateral lower extremity swelling and erythema.  Stiffness in her joints.  All other systems reviewed and are negative.  EKGs/Labs/Other Studies Reviewed:    The following studies were reviewed today:  2D Doppler echocardiogram November 2021:  IMPRESSIONS    1. Left ventricular ejection fraction, by estimation, is 60 to 65%. The  left ventricle has normal function. The left ventricle has no regional  wall motion abnormalities. There is mild left ventricular hypertrophy.  Left ventricular diastolic parameters  are consistent with Grade I diastolic dysfunction (impaired relaxation).  2. Right ventricular systolic function is normal. The right ventricular  size is normal.  3. Left atrial size was moderately dilated.  4. The mitral valve is grossly normal. Trivial mitral valve  regurgitation.  5. The  aortic valve was not well visualized. Aortic valve regurgitation  is not visualized. Moderate aortic valve stenosis. Aortic valve area, by  VTI measures 1.20 cm. Aortic valve mean gradient measures 14.0 mmHg.  Aortic valve Vmax measures 2.48 m/s.  DI is 0.35.  6. Aortic dilatation noted. There is mild dilatation of the ascending  aorta, measuring 38 mm.  7. The inferior vena cava is normal in size with <50% respiratory  variability, suggesting right atrial pressure of 8 mmHg.   EKG:  EKG July 20, 2020 demonstrated tachycardia, PACs, poor R wave progression, left atrial abnormality, but no acute ST-T wave change.  Recent Labs: 06/28/2020: ALT 28 07/18/2020: BUN 20; Creatinine, Ser 0.60; Hemoglobin 14.2; Platelets 262; Potassium 3.7; Sodium 142  Recent Lipid Panel    Component Value Date/Time   CHOL 160 06/29/2020 0500   CHOL 179 02/13/2019 1626   TRIG 133  06/29/2020 0500   HDL 40 (L) 06/29/2020 0500   HDL 42 02/13/2019 1626   CHOLHDL 4.0 06/29/2020 0500   VLDL 27 06/29/2020 0500   LDLCALC 93 06/29/2020 0500   LDLCALC 74 02/13/2019 1626   LDLCALC 82 01/18/2018 1416    Physical Exam:    VS:  BP 130/72 (BP Location: Left Arm, Patient Position: Sitting, Cuff Size: Normal)   Pulse 80   Ht 5\' 3"  (1.6 m)   Wt 195 lb (88.5 kg)   LMP  (LMP Unknown)   SpO2 94%   BMI 34.54 kg/m     Wt Readings from Last 3 Encounters:  09/02/20 195 lb (88.5 kg)  08/14/20 197 lb 9.6 oz (89.6 kg)  07/28/20 193 lb (87.5 kg)     GEN: Obese with BMI 35.. No acute distress HEENT: Normal NECK: No JVD. LYMPHATICS: No lymphadenopathy CARDIAC: 3/6 crescendo decrescendo systolic murmur. RRR no gallop, but bilateral 2-3+ ankle to knee edema with erythema of skin.   VASCULAR:  Normal Pulses. No bruits. RESPIRATORY:  Clear to auscultation without rales, wheezing or rhonchi  ABDOMEN: Soft, non-tender, non-distended, No pulsatile mass, MUSCULOSKELETAL: No deformity  SKIN: Warm and dry NEUROLOGIC:   Alert and oriented x 3 PSYCHIATRIC:  Normal affect   ASSESSMENT:    1. Coronary artery disease involving native coronary artery of native heart with angina pectoris ([place])   2. Essential hypertension   3. Hyperlipidemia LDL goal <70   4. Nonrheumatic aortic valve stenosis   5. History of CVA (cerebrovascular accident)   6. Aortic aneurysm without rupture, unspecified portion of aorta ([place])   7. Educated about COVID-19 virus infection    PLAN:    In order of problems listed above:  1. No recurrence of chest discomfort with increasing Toprol-XL to 100 mg/day and better blood pressure control.  Secondary prevention for both coronary disease and cerebrovascular disease as outlined. 2. Blood pressure today is adequate running 130/70 -140/80  mmHg on 2 different occasions during the visit.  Lower extremity edema and skin erythema would not allow 10 mg amlodipine dosing.  Decrease to 5 mg/day.  Start chlorthalidone 12.5 mg/day.  10 to 14-day follow-up with basic metabolic panel.  We will not add potassium supplements since she is on 40 mg of Zestril per day.  Blood pressure target is 140/80 mmHg.  Systolic blood pressures less than 115 mmHg will be detrimental.  Sweet spot blood pressure ranges 130 to 564 mmHg systolic.  Basic metabolic panel will be obtained on return. 3. High intensity statin therapy with Lipitor 80 mg/day.  Most recent recorded LDL in November was 93. 4. Moderate aortic stenosis is noted 5. No residual neurological deficit that the patient complains of at this time. 6. Aortic diameter 3.8 cm on the most recently performed echo. 7. Vaccinated and boosted.  She is practicing medication.     Medication Adjustments/Labs and Tests Ordered: Current medicines are reviewed at length with the patient today.  Concerns regarding medicines are outlined above.  Orders Placed This Encounter  Procedures  . Basic metabolic panel  . AMB Referral to [facility] Pharm-D   Meds ordered this  encounter  Medications  . amLODipine (NORVASC) 5 MG tablet    Sig: Take 1 tablet (5 mg total) by mouth daily.    Dispense:  90 tablet    Refill:  3    Dose change  . chlorthalidone (HYGROTON) 25 MG tablet    Sig: Take 0.5 tablets (12.5 mg total) by  mouth daily.    Dispense:  45 tablet    Refill:  3    Patient Instructions  Medication Instructions:  1) DECREASE Amlodipine to 5mg  once daily 2) START Chlorthalidone 12.5mg  once daily  *If you need a refill on your cardiac medications before your next appointment, please call your pharmacy*   Lab Work: BMET in 10-14 days  If you have labs (blood work) drawn today and your tests are completely normal, you will receive your results only by: [name] MyChart Message (if you have MyChart) OR . A paper copy in the mail If you have any lab test that is abnormal or we need to change your treatment, we will call you to review the results.   Testing/Procedures: None   Follow-Up:  Your physician recommends that you schedule a follow-up appointment in: 10-14 days with the Hypertension Clinic (same day as labs).  At [facility], you and your health needs are our priority.  As part of our continuing mission to provide you with exceptional heart care, we have created designated Provider Care Teams.  These Care Teams include your primary Cardiologist (physician) and Advanced Practice Providers (APPs -  Physician Assistants and Nurse Practitioners) who all work together to provide you with the care you need, when you need it.  We recommend signing up for the patient portal called "MyChart".  Sign up information is provided on this After Visit Summary.  MyChart is used to connect with patients for Virtual Visits (Telemedicine).  Patients are able to view lab/test results, encounter notes, upcoming appointments, etc.  Non-urgent messages can be sent to your provider as well.   To learn more about what you can do with MyChart, go to  [email or web address].    Your next appointment:   6-8 month(s)  The format for your next appointment:   In Person  Provider:   You may see Sinclair Grooms, MD or one of the following Advanced Practice Providers on your designated Care Team:    Kathyrn Drown, NP    Other Instructions      Signed, Sinclair Grooms, MD  09/02/2020 12:51 PM    [place]

## 2020-09-02 ENCOUNTER — Ambulatory Visit (INDEPENDENT_AMBULATORY_CARE_PROVIDER_SITE_OTHER): Payer: Medicare Other | Admitting: Interventional Cardiology

## 2020-09-02 ENCOUNTER — Encounter: Payer: Self-pay | Admitting: Interventional Cardiology

## 2020-09-02 ENCOUNTER — Other Ambulatory Visit: Payer: Self-pay

## 2020-09-02 VITALS — BP 130/72 | HR 80 | Ht 63.0 in | Wt 195.0 lb

## 2020-09-02 DIAGNOSIS — I10 Essential (primary) hypertension: Secondary | ICD-10-CM | POA: Diagnosis not present

## 2020-09-02 DIAGNOSIS — E785 Hyperlipidemia, unspecified: Secondary | ICD-10-CM | POA: Diagnosis not present

## 2020-09-02 DIAGNOSIS — Z7189 Other specified counseling: Secondary | ICD-10-CM

## 2020-09-02 DIAGNOSIS — I350 Nonrheumatic aortic (valve) stenosis: Secondary | ICD-10-CM

## 2020-09-02 DIAGNOSIS — I25119 Atherosclerotic heart disease of native coronary artery with unspecified angina pectoris: Secondary | ICD-10-CM | POA: Diagnosis not present

## 2020-09-02 DIAGNOSIS — I719 Aortic aneurysm of unspecified site, without rupture: Secondary | ICD-10-CM | POA: Diagnosis not present

## 2020-09-02 DIAGNOSIS — Z8673 Personal history of transient ischemic attack (TIA), and cerebral infarction without residual deficits: Secondary | ICD-10-CM

## 2020-09-02 MED ORDER — CHLORTHALIDONE 25 MG PO TABS: 12.5000 mg | ORAL_TABLET | Freq: Every day | ORAL | 3 refills | Status: DC

## 2020-09-02 MED ORDER — AMLODIPINE BESYLATE 5 MG PO TABS: 5.0000 mg | ORAL_TABLET | Freq: Every day | ORAL | 3 refills | Status: DC

## 2020-09-02 NOTE — Patient Instructions (Signed)
Medication Instructions:  1) DECREASE Amlodipine to 5mg  once daily 2) START Chlorthalidone 12.5mg  once daily  *If you need a refill on your cardiac medications before your next appointment, please call your pharmacy*   Lab Work: BMET in 10-14 days  If you have labs (blood work) drawn today and your tests are completely normal, you will receive your results only by: [name] MyChart Message (if you have MyChart) OR . A paper copy in the mail If you have any lab test that is abnormal or we need to change your treatment, we will call you to review the results.   Testing/Procedures: None   Follow-Up:  Your physician recommends that you schedule a follow-up appointment in: 10-14 days with the Hypertension Clinic (same day as labs).  At [facility], you and your health needs are our priority.  As part of our continuing mission to provide you with exceptional heart care, we have created designated Provider Care Teams.  These Care Teams include your primary Cardiologist (physician) and Advanced Practice Providers (APPs -  Physician Assistants and Nurse Practitioners) who all work together to provide you with the care you need, when you need it.  We recommend signing up for the patient portal called "MyChart".  Sign up information is provided on this After Visit Summary.  MyChart is used to connect with patients for Virtual Visits (Telemedicine).  Patients are able to view lab/test results, encounter notes, upcoming appointments, etc.  Non-urgent messages can be sent to your provider as well.   To learn more about what you can do with MyChart, go to [email or web address].    Your next appointment:   6-8 month(s)  The format for your next appointment:   In Person  Provider:   You may see Sinclair Grooms, MD or one of the following Advanced Practice Providers on your designated Care Team:    Kathyrn Drown, NP    Other Instructions

## 2020-09-08 ENCOUNTER — Ambulatory Visit (INDEPENDENT_AMBULATORY_CARE_PROVIDER_SITE_OTHER): Payer: Medicare Other | Admitting: Podiatry

## 2020-09-08 ENCOUNTER — Other Ambulatory Visit: Payer: Self-pay

## 2020-09-08 DIAGNOSIS — M76822 Posterior tibial tendinitis, left leg: Secondary | ICD-10-CM

## 2020-09-08 DIAGNOSIS — M659 Synovitis and tenosynovitis, unspecified: Secondary | ICD-10-CM | POA: Diagnosis not present

## 2020-09-08 DIAGNOSIS — M2042 Other hammer toe(s) (acquired), left foot: Secondary | ICD-10-CM

## 2020-09-08 DIAGNOSIS — M21612 Bunion of left foot: Secondary | ICD-10-CM | POA: Diagnosis not present

## 2020-09-08 MED ORDER — BETAMETHASONE SOD PHOS & ACET 6 (3-3) MG/ML IJ SUSP
3.0000 mg | Freq: Once | INTRAMUSCULAR | Status: AC
  Administered 2020-09-08: 3 mg via INTRA_ARTICULAR

## 2020-09-08 NOTE — Progress Notes (Signed)
   Subjective: 87 y.o. female presenting today for follow up evaluation of bilateral foot pain.  Patient states that the pain to her bilateral feet and ankles has recurred.  She states that the injections helped significantly with the pain.  She is very active 87 year old female and wears Ortho feet shoes patient states that most recently she has been developing some right ankle pain.  She denies a history of injury.  She does state that recently in November she had a TIA.  She presents for further treatment and evaluation  Past Medical History:  Diagnosis Date  . Anxiety   . Aortic aneurysm without rupture ([place])   . CAD (coronary artery disease)   . Chronic hip pain   . Endometriosis   . Hearing loss   . HTN, goal below 150/90   . Hyperlipidemia LDL goal <70 11/18/2014  . Osteopenia of the elderly   . Urge and stress incontinence      Objective: Physical Exam General: The patient is alert and oriented x3 in no acute distress.  Dermatology: Skin is cool, dry and supple bilateral lower extremities. Negative for open lesions or macerations.  Vascular: Palpable pedal pulses bilaterally. No edema or erythema noted. Capillary refill within normal limits.  Neurological: Epicritic and protective threshold grossly intact bilaterally.   Musculoskeletal Exam: Clinical evidence of bunion deformity noted to the respective foot. There is moderate pain on palpation range of motion of the first MPJ. Lateral deviation of the hallux noted consistent with hallux abductovalgus. Hammertoe contracture also noted on clinical exam to the 2nd digit of the left foot. Symptomatic pain on palpation and range of motion also noted to the metatarsal phalangeal joints of the respective hammertoe digits.   Pain on palpation noted to the posterior tibial tendon of the bilateral foot with medial longitudinal arch collapse noted.  Assessment: 1. HAV w/ bunion deformity left 2. Hammertoe deformity 2nd digit left 3.  PTTD bilateral 4.  Posterior tibial tendinitis left 5.  Plantar fasciitis left-resolved 6.  Synovitis of ankle right   Plan of Care:  1. Patient was evaluated.  2. Injection of 0.5 mLs Celestone Soluspan injected into the posterior tibial tendon sheath of the left foot and right ankle lateral aspect.  3.  Continue OTC Tylenol as needed 4. Continue wearing Orthofeet shoes.  5.  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     Edrick Kins, DPM [facility]  Dr. Edrick Kins, DPM    [address], [place] [address]                Office [phone]  Fax [phone]

## 2020-09-10 DIAGNOSIS — M170 Bilateral primary osteoarthritis of knee: Secondary | ICD-10-CM | POA: Diagnosis not present

## 2020-09-10 DIAGNOSIS — M6281 Muscle weakness (generalized): Secondary | ICD-10-CM | POA: Diagnosis not present

## 2020-09-14 NOTE — Progress Notes (Signed)
Patient ID: Angel [name]                 DOB: [birth year]                      MRN: [id number]     HPI: Angel [name] is a 87 y.o. female referred by Dr. Tamala Julian to HTN clinic. PMH is significant for HTN, CVA (2021), CAD, hyperlipidemia, moderate aortic stenosisandaortic enlargement, questionable small apical infarct or diverticulum by cardiac MRI, and hx of falls.  Pt last seen by Dr. Tamala Julian on 09/02/20 and BP 130/72 was very close to goal. Pt reported lower extremity edema and skin erythema with amlodipine 10 mg daily. Therefore, amlodipine was decreased back to 5 mg daily and chlorthalidone 12.5 mg daily was started. Per Dr. Tamala Julian, SBP <115 mmHg will be detrimental. Sweet spot blood pressure ranges 130 to 948 mmHg systolic. Additionally, pt with hx of repeated falls due to dizziness/imbalance.  Patient presents today in good spirits accompanied with significant other. Reports medication adherence with all hypertension medications. Reports accidentally taking an extra dose of metoprolol succinate 100 mg last weekend and felt terrible - reports no reoccurrences. Reports LE swelling and rash resolved since reducing back to amlodipine 5 mg daily and starting chlorthalidone 12.5 mg daily. Denies headaches, dizziness, and blurred vision. Pt did not bring BP log, however reports BP readings <140/90 and no SBP readings in the 110s. Reports joining [facility] that focuses on weight loss and healthy eating. Reports 1300 calorie restriction per day and has lost 6 lbs in the last month since joining this program. Goal is to lose 20 lbs by the end of May. Reports participating in Osteoarthritic PT program in [place] 1-2x/week.   Current HTN meds: amlodipine 5 mg daily (AM), chlorthalidone 12.5 mg daily (AM) (0.5 tab of 25 mg), lisinopril 40 mg daily (PM), metoprolol succinate 100 mg daily (PM) Previously tried: amlodipine 10 mg daily (LE swelling and skin erythema) BP goal: <140/90  mmHg  Family History: Breast cancer (age of onset: 30) in her maternal grandmother; Cancer in her brother; Heart disease in her maternal grandmother; Stroke in her mother.  Social History: denies tobacco use  Diet: Joined [facility] - 1300 calorie restriction a day Breakfast: oatmeal with fruits and nuts; single egg and toast; egg sandwich and an orange Lunch: limiting bread, eating tuna salads, low sodium [place] with lettuce wrap, smoothie  Dinner: lean meat, potatoes, vegetable, salad Snacks: fresh fruits and roasted vegetables Drinks: water daily, 0 sugar gingerale  Exercise: Osteoarthritic PT program in [place] 1-2x/week  Home BP readings: <140/90  Wt Readings from Last 3 Encounters:  09/02/20 195 lb (88.5 kg)  08/14/20 197 lb 9.6 oz (89.6 kg)  07/28/20 193 lb (87.5 kg)   BP Readings from Last 3 Encounters:  09/15/20 130/82  09/02/20 130/72  08/14/20 138/90   Pulse Readings from Last 3 Encounters:  09/15/20 69  09/02/20 80  08/14/20 66    Renal function: CrCl cannot be calculated (Patient's most recent lab result is older than the maximum 21 days allowed.).  Past Medical History:  Diagnosis Date  . Anxiety   . Aortic aneurysm without rupture ([place])   . CAD (coronary artery disease)   . Chronic hip pain   . Endometriosis   . Hearing loss   . HTN, goal below 150/90   . Hyperlipidemia LDL goal <70 11/18/2014  . Osteopenia of the elderly   .  Urge and stress incontinence     Current Outpatient Medications on File Prior to Visit  Medication Sig Dispense Refill  . amLODipine (NORVASC) 5 MG tablet Take 1 tablet (5 mg total) by mouth daily. 90 tablet 3  . Ascorbic Acid (VITAMIN C) 100 MG tablet Take 500 mg by mouth daily. Gummy 750mg     . aspirin EC 81 MG EC tablet Take 1 tablet (81 mg total) by mouth daily. Swallow whole. 30 tablet 11  . atorvastatin (LIPITOR) 80 MG tablet Take 1 tablet (80 mg total) by mouth daily. 30 tablet 0  . chlorthalidone (HYGROTON) 25 MG tablet  Take 0.5 tablets (12.5 mg total) by mouth daily. 45 tablet 3  . glucosamine-chondroitin 500-400 MG tablet Take 1 tablet by mouth daily.    [name] lisinopril (ZESTRIL) 40 MG tablet Take 1 tablet (40 mg total) by mouth daily.    . melatonin 3 MG TABS tablet Take 3 mg by mouth at bedtime.    . metoprolol succinate (TOPROL-XL) 100 MG 24 hr tablet Take 1 tablet (100 mg total) by mouth daily. Take with or immediately following a meal. 90 tablet 3  . Multiple Vitamin (MULTIVITAMIN) capsule Take 1 capsule by mouth daily.    . Omega-3 Fatty Acids (FISH OIL) 1000 MG CAPS Take by mouth. Krill oil    . Probiotic Product (PROBIOTIC DAILY PO) Take by mouth.    . vitamin B-12 (CYANOCOBALAMIN) 100 MCG tablet Take 1,000 mcg by mouth daily.     [name] VITAMIN D, ERGOCALCIFEROL, PO Take 1,000 Units by mouth daily.    [name] ALPRAZolam (XANAX) 0.25 MG tablet Take 1 tablet up to twice daily as needed for anxiety episodes.  Use sparingly and not to take it every day. (Patient not taking: Reported on 09/15/2020) 20 tablet 1  . nitroGLYCERIN (NITROSTAT) 0.4 MG SL tablet Place 1 tablet (0.4 mg total) under the tongue every 5 (five) minutes as needed for chest pain. (Patient not taking: Reported on 09/15/2020) 25 tablet 3   No current facility-administered medications on file prior to visit.    Allergies  Allergen Reactions  . Codeine Other (See Comments)    "zoned out"  . Levofloxacin Other (See Comments)    AMS  . Myrbetriq [Mirabegron] Other (See Comments)    Severe depression  . Prednisone Other (See Comments)    AMS  . Sulfa Antibiotics Other (See Comments)    AMS     Assessment/Plan:  1. Hypertension - BP at goal <140/90. Dr. Tamala Julian requested ideal SBP between 130 to 145 mmHg. Medication adherence appears optimal. Continued amlodipine 5 mg daily, chlorthalidone 12.5 mg daily (0.5 tab of 25 mg), lisinopril 40 mg daily, and metoprolol succinate 100 mg daily. Encouraged patient to use a pill box to help with organization of  medications. Patient verbalized understanding. Congratulated patient on weight loss achievement and healthy diet, and to continue NO[facility] program. Follow-up appointment scheduled in 6 weeks to assess continued BP control.  Lorel Monaco, PharmD, [facility] [address], [place], [place] [address] Phone: [phone]; Fax: [phone]

## 2020-09-15 ENCOUNTER — Ambulatory Visit (INDEPENDENT_AMBULATORY_CARE_PROVIDER_SITE_OTHER): Payer: Medicare Other | Admitting: Pharmacist

## 2020-09-15 ENCOUNTER — Encounter: Payer: Self-pay | Admitting: Pharmacist

## 2020-09-15 ENCOUNTER — Other Ambulatory Visit: Payer: Self-pay

## 2020-09-15 ENCOUNTER — Other Ambulatory Visit: Payer: Medicare Other

## 2020-09-15 VITALS — BP 130/82 | HR 69

## 2020-09-15 DIAGNOSIS — I25119 Atherosclerotic heart disease of native coronary artery with unspecified angina pectoris: Secondary | ICD-10-CM | POA: Diagnosis not present

## 2020-09-15 DIAGNOSIS — E785 Hyperlipidemia, unspecified: Secondary | ICD-10-CM | POA: Diagnosis not present

## 2020-09-15 DIAGNOSIS — M6281 Muscle weakness (generalized): Secondary | ICD-10-CM | POA: Diagnosis not present

## 2020-09-15 DIAGNOSIS — I10 Essential (primary) hypertension: Secondary | ICD-10-CM | POA: Diagnosis not present

## 2020-09-15 DIAGNOSIS — M170 Bilateral primary osteoarthritis of knee: Secondary | ICD-10-CM | POA: Diagnosis not present

## 2020-09-15 LAB — BASIC METABOLIC PANEL
BUN/Creatinine Ratio: 36 — ABNORMAL HIGH (ref 12–28)
BUN: 27 mg/dL (ref 8–27)
CO2: 24 mmol/L (ref 20–29)
Calcium: 9.6 mg/dL (ref 8.7–10.3)
Chloride: 101 mmol/L (ref 96–106)
Creatinine, Ser: 0.75 mg/dL (ref 0.57–1.00)
GFR calc Af Amer: 83 mL/min/{1.73_m2} (ref >59)
GFR calc non Af Amer: 72 mL/min/{1.73_m2} (ref >59)
Glucose: 133 mg/dL — ABNORMAL HIGH (ref 65–99)
Potassium: 4.3 mmol/L (ref 3.5–5.2)
Sodium: 141 mmol/L (ref 134–144)

## 2020-09-15 MED ORDER — ATORVASTATIN CALCIUM 80 MG PO TABS: 80.0000 mg | ORAL_TABLET | Freq: Every day | ORAL | 3 refills | Status: DC

## 2020-09-15 NOTE — Patient Instructions (Addendum)
It was nice to see you today!  Your goal blood pressure is <140/90 mmHg. In clinic, your blood pressure was 130/82 mmHg.  Medication Changes: Continue amlodipine 5 mg daily (AM)  Continue chlorthalidone 12.5 mg daily (0.5 tab of 25 mg) (AM)  Continue lisinopril 40 mg daily (PM)  Continue metoprolol succinate 100 mg daily (PM)  Monitor blood pressure at home daily and keep a log (on your phone or piece of paper) to bring with you to your next visit. Write down date, time, blood pressure and pulse.  Keep up the good work with diet and exercise. Aim for a diet full of vegetables, fruit and lean meats (chicken, [place], fish). Try to limit salt intake by eating fresh or frozen vegetables (instead of canned), rinse canned vegetables prior to cooking and do not add any additional salt to meals.   Please give [place] a call at [phone] with any questions or concerns.

## 2020-09-21 DIAGNOSIS — M9901 Segmental and somatic dysfunction of cervical region: Secondary | ICD-10-CM | POA: Diagnosis not present

## 2020-09-21 DIAGNOSIS — M9903 Segmental and somatic dysfunction of lumbar region: Secondary | ICD-10-CM | POA: Diagnosis not present

## 2020-09-21 DIAGNOSIS — M5136 Other intervertebral disc degeneration, lumbar region: Secondary | ICD-10-CM | POA: Diagnosis not present

## 2020-09-21 DIAGNOSIS — M62830 Muscle spasm of back: Secondary | ICD-10-CM | POA: Diagnosis not present

## 2020-09-22 ENCOUNTER — Encounter: Payer: Self-pay | Admitting: *Deleted

## 2020-09-25 DIAGNOSIS — M170 Bilateral primary osteoarthritis of knee: Secondary | ICD-10-CM | POA: Diagnosis not present

## 2020-10-01 ENCOUNTER — Telehealth: Payer: Self-pay | Admitting: Interventional Cardiology

## 2020-10-01 DIAGNOSIS — M170 Bilateral primary osteoarthritis of knee: Secondary | ICD-10-CM | POA: Diagnosis not present

## 2020-10-01 NOTE — Telephone Encounter (Signed)
     Pt would like to let Angel [name] know that her swelling are all gone now and that she's doing fine, she wanted to thank Angel [name] for everything she does for her. She also said she been trying to call but unable to contact us due to long wait

## 2020-10-06 DIAGNOSIS — M170 Bilateral primary osteoarthritis of knee: Secondary | ICD-10-CM | POA: Diagnosis not present

## 2020-10-06 DIAGNOSIS — M6281 Muscle weakness (generalized): Secondary | ICD-10-CM | POA: Diagnosis not present

## 2020-10-08 DIAGNOSIS — H25013 Cortical age-related cataract, bilateral: Secondary | ICD-10-CM | POA: Diagnosis not present

## 2020-10-12 DIAGNOSIS — M170 Bilateral primary osteoarthritis of knee: Secondary | ICD-10-CM | POA: Diagnosis not present

## 2020-10-12 DIAGNOSIS — M6281 Muscle weakness (generalized): Secondary | ICD-10-CM | POA: Diagnosis not present

## 2020-10-12 NOTE — Progress Notes (Unsigned)
Angel [name] is a 87 y.o. female who presents for annual wellness visit and follow-up on chronic medical conditions.  She has the following concerns:  Constipation issues since her stroke. States bowel movements are "erratic" and "miserable" at certain times. Normal bowel movements at times as well.  States she is not overly concerned. Reports in the past she was able to have a bowel movement every morning.  She is drinking a small can of prune juice. Occasionally it causes diarrhea.   Reports doing Noom diet for weight loss.  Cutting back on carbohydrates. Getting plenty of fiber.  Drinking 4 -16 ounces water.    No blood in her stool.   Reports cardiologist started her on diuretic to help with LE edema which it has. States she has noticed a slight increase of urinary frequency but no other urinary symptoms   HTN- managed by cardiologist. Reports good daily compliance.   Reports being hard of hearing and is using hearing aids bilaterally   Sleep issues- melatonin did not help. Only having issues with insomnia once every now and then.  Takes a 30 minute nap in the afternoon.  States her dog wakes her up occasionally and then she has difficulty going back to sleep.   Right hand is asleep when she wakes up once every 4-5 nights. She "shakes it out and goes back to normal".   Right foot with foot arch pain. She has orthotics.  Wearing good orthopedic shoes. Sees podiatrist for this. Injections periodically for pain.   States she is going to PT clinic in [place] for post stroke rehab.   She has an upcoming appt with dermatologist.   Reports being "grateful" for her care and for her health.   She is still working. Has a wedding venue business.  She has a significant other.   Enjoys time with her children and grandchildren and takes time to enjoy family.     Immunization History  Administered Date(s) Administered  . Fluad Quad(high Dose 65+) 04/18/2019  . Influenza, High  Dose Seasonal PF 08/04/2016, 06/01/2017, 05/17/2018  . Influenza-Unspecified 04/09/2015, 05/09/2015  . PFIZER(Purple Top)SARS-COV-2 Vaccination 08/23/2019, 09/13/2019, 05/12/2020  . Tdap 01/18/2018  . Zoster Recombinat (Shingrix) 01/25/2018   Last Pap smear: Aged out  Last mammogram: 10/05/17 Last colonoscopy: aged out last known year 2010 Last DEXA: 2012 T score -2 Dentist: Q three month Ophtho: Q Year Exercise: walk Qd for about 25 min  Other doctors caring for patient include: Dr. Tamala Julian Cardio. , Dr. Darden Dates neurology, Dr. Amalia Hailey podiatry, Dr. Marry Guan orth surg.   Depression screen:  See questionnaire below.  Depression screen [facility] 2/9 10/13/2020 07/10/2020 08/16/2019 08/13/2019 04/18/2019  Decreased Interest 0 0 0 0 0  Down, Depressed, Hopeless 0 0 0 0 0  PHQ - 2 Score 0 0 0 0 0  Altered sleeping - - 0 - 0  Tired, decreased energy - - 0 - 0  Change in appetite - - 0 - 0  Feeling bad or failure about yourself  - - 0 - 0  Trouble concentrating - - 0 - 0  Moving slowly or fidgety/restless - - 0 - 0  Suicidal thoughts - - 0 - 0  PHQ-9 Score - - 0 - 0  Difficult doing work/chores - - Not difficult at all - Not difficult at all    Fall Risk Screen: see questionnaire below. Fall Risk  10/13/2020 07/10/2020 08/16/2019 08/13/2019 04/18/2019  Falls in the past year? 1 1 0 0  0  Number falls in past yr: 0 1 0 0 0  Injury with Fall? 1 0 0 0 0  Risk for fall due to : Impaired balance/gait - - Orthopedic patient -  Follow up Falls evaluation completed;Education provided - - Falls prevention discussed -    ADL screen:  See questionnaire below Functional Status Survey: Is the patient deaf or have difficulty hearing?: Yes Does the patient have difficulty seeing, even when wearing glasses/contacts?: No Does the patient have difficulty concentrating, remembering, or making decisions?: No Does the patient have difficulty walking or climbing stairs?: Yes Does the patient have difficulty dressing or bathing?:  No Does the patient have difficulty doing errands alone such as visiting a doctor's office or shopping?: No   End of Life Discussion:  Patient has a living will and medical power of attorney. States daughter in law Carinne Brandenburger is her HCPOA. Discussed MOST form today. She is not sure what her wishes are at this time so she will take this home to discuss with [employer].   Review of Systems Constitutional: -fever, -chills, -sweats, -unexpected weight change, -anorexia, -fatigue Allergy: -sneezing, -itching, -congestion Dermatology: denies changing moles, rash, lumps, new worrisome lesions ENT: -runny nose, -ear pain, -sore throat, -hoarseness, -sinus pain, -teeth pain, -tinnitus, -hearing loss, -epistaxis Cardiology:  -chest pain, -palpitations, -edema, -orthopnea, -paroxysmal nocturnal dyspnea Respiratory: -cough, -shortness of breath, -dyspnea on exertion, -wheezing, -hemoptysis Gastroenterology: -abdominal pain, -nausea, -vomiting, -diarrhea, -constipation, -blood in stool, -changes in bowel movement, -dysphagia Hematology: -bleeding or bruising problems Musculoskeletal: -arthralgias, -myalgias, -joint swelling, -back pain, -neck pain, -cramping, -gait changes Ophthalmology: -vision changes, -eye redness, -itching, -discharge Urology: -dysuria, -difficulty urinating, -hematuria, +urinary frequency, -urgency, -incontinence Neurology: -headache, -weakness, -tingling, -numbness, -speech abnormality, -memory loss, -falls, -dizziness Psychology:  -depressed mood, -agitation, +sleep problems    PHYSICAL EXAM:  BP 130/84   Pulse 68   Temp 97.8 F (36.6 C)   Ht 5' 2.5" (1.588 m)   Wt 192 lb 6.4 oz (87.3 kg)   LMP  (LMP Unknown)   SpO2 94%   BMI 34.63 kg/m   General Appearance: Alert, cooperative, no distress, appears stated age Head: Normocephalic, without obvious abnormality, atraumatic Eyes: PERRL, conjunctiva/corneas clear, EOM's intact Ears: Normal TM's and external ear canals Nose:  mask on  Throat: mask on  Neck: Supple, no lymphadenopathy; thyroid: no enlargement/tenderness/nodules Back: Spine nontender Lungs: Clear to auscultation bilaterally without wheezes, rales or ronchi; respirations unlabored Chest Wall: No tenderness or deformity Heart: Regular rate and rhythm. Murmur present  Breast Exam: No tenderness, masses, or nipple discharge or inversion. No axillary lymphadenopathy Abdomen: Soft, non-tender, nondistended, normoactive bowel sounds Genitalia: declines  Extremities: No clubbing, cyanosis or edema Pulses: 2+ and symmetric all extremities Skin: Skin color, texture, turgor normal, no rashes or lesions Lymph nodes: Cervical and supraclavicular nodes normal  Neurologic: CNII-XII intact, normal strength, sensation and gait Psych: Normal mood, affect, hygiene and grooming.  ASSESSMENT/PLAN: Medicare annual wellness visit, subsequent -She is here today for a Medicare wellness visit.  No recent falls, memory changes, difficulties with ADLs or depression.  She meditates.  She is in good spirits.  She has several specialists.  Reviewed medications.  Reviewed labs from her previous visit.  Counseling on advanced directives and immunizations.  Hypertension goal BP (blood pressure) < 140/90 -Blood pressure in goal range today.  Follow-up with cardiology as recommended.  Hyperlipidemia LDL goal <70 -Continue statin therapy.  Reviewed recent labs  Coronary artery disease involving native coronary artery of  native heart with angina pectoris ([place]) -Continue statin therapy and continue to follow with cardiology  Prediabetes -Continue low carbohydrate diet  Osteopenia, unspecified location -Declines DEXA scan.  Continue getting adequate calcium in her diet, vitamin D and weightbearing exercises.  Advance directive discussed with patient -Counseling on advanced directives.  She has HC POA and living will at home.  MOST form reviewed and discussed.  She will let me  know if she decides to fill this out if she has any questions.  Constipation, unspecified constipation type -Discussed multiple options for treating constipation including stool softeners, FiberCon, adequate hydration and activity.  Discussed trying peppermint oil such as IBgard for spasms.  Follow-up if symptoms are worsening.  Obesity (BMI 30.0-34.9) -Congratulated her on doing new.  She seems to be quite optimistic about this approach for weight loss.    Discussed monthly self breast exams and yearly mammograms; at least 30 minutes of aerobic activity at least 5 days/week and weight-bearing exercise 2x/week; proper sunscreen use reviewed; healthy diet, including goals of calcium and vitamin D intake and alcohol recommendations (less than or equal to 1 drink/day) reviewed; regular seatbelt use; changing batteries in smoke detectors.  Immunization recommendations discussed.  Colonoscopy recommendations reviewed   Medicare Attestation I have personally reviewed: The patient's medical and social history Their use of alcohol, tobacco or illicit drugs Their current medications and supplements The patient's functional ability including ADLs,fall risks, home safety risks, cognitive, and hearing and visual impairment Diet and physical activities Evidence for depression or mood disorders  The patient's weight, height, and BMI have been recorded in the chart.  I have made referrals, counseling, and provided education to the patient based on review of the above and I have provided the patient with a written personalized care plan for preventive services.     Harland Dingwall, NP-C   10/13/2020

## 2020-10-13 ENCOUNTER — Ambulatory Visit (INDEPENDENT_AMBULATORY_CARE_PROVIDER_SITE_OTHER): Payer: Medicare Other | Admitting: Family Medicine

## 2020-10-13 ENCOUNTER — Encounter: Payer: Self-pay | Admitting: Family Medicine

## 2020-10-13 ENCOUNTER — Other Ambulatory Visit: Payer: Self-pay

## 2020-10-13 VITALS — BP 130/84 | HR 68 | Temp 97.8°F | Ht 62.5 in | Wt 192.4 lb

## 2020-10-13 DIAGNOSIS — Z7189 Other specified counseling: Secondary | ICD-10-CM | POA: Diagnosis not present

## 2020-10-13 DIAGNOSIS — M8580 Other specified disorders of bone density and structure, unspecified site: Secondary | ICD-10-CM

## 2020-10-13 DIAGNOSIS — R7303 Prediabetes: Secondary | ICD-10-CM | POA: Diagnosis not present

## 2020-10-13 DIAGNOSIS — E669 Obesity, unspecified: Secondary | ICD-10-CM

## 2020-10-13 DIAGNOSIS — I10 Essential (primary) hypertension: Secondary | ICD-10-CM

## 2020-10-13 DIAGNOSIS — I25119 Atherosclerotic heart disease of native coronary artery with unspecified angina pectoris: Secondary | ICD-10-CM

## 2020-10-13 DIAGNOSIS — K5900 Constipation, unspecified: Secondary | ICD-10-CM

## 2020-10-13 DIAGNOSIS — Z0000 Encounter for general adult medical examination without abnormal findings: Secondary | ICD-10-CM | POA: Diagnosis not present

## 2020-10-13 DIAGNOSIS — E785 Hyperlipidemia, unspecified: Secondary | ICD-10-CM | POA: Diagnosis not present

## 2020-10-13 NOTE — Patient Instructions (Signed)
  [name] , Thank you for taking time to come for your Medicare Wellness Visit. I appreciate your ongoing commitment to your health goals. Please review the following plan we discussed and let me know if I can assist you in the future.   These are the goals we discussed: Goals    . Chronic Care Management     CARE PLAN ENTRY  Current Barriers:  . Chronic Disease Management support, education, and care coordination needs related to Hypertension, Hyperlipidemia, and Osteoarthritis   Hypertension . Pharmacist Clinical Goal(s): o Over the next 90 days, patient will work with PharmD and providers to maintain BP goal <140/90 . Current regimen:  o Amlodipine 5mg  at bedtime o lisinopril 40mg  at bedtime o metoprolol 75mg  daily with meals . Interventions: o Please stop ibuprofen 200mg  2 tabs at bedtime . Patient self care activities - Over the next 90 days, patient will: o Check BP daily, document, and provide at future appointments o Ensure daily salt intake < 2300 mg/day  Hyperlipidemia . Pharmacist Clinical Goal(s): o Over the next 90 days, patient will work with PharmD and providers to achieve LDL goal < 70 . Current regimen:  o Atorvastatin 40mg  daily . Interventions: o None . Patient self care activities - Over the next 90 days, patient will: o Reduce triglycerides by eating less saturated fat, especially near bedtime  Chronic Pain . Pharmacist Clinical Goal(s) o Over the next 90 days, patient will work with PharmD and providers to optimize pain management . Current regimen:  o Glucosamine/chondroitin 500/400 mg daily,  o meloxicam 7.5mg  daily o Ibuprofen 400mg  at bedtime . Interventions: o Tylenol 500mg  2 tabs by mouth three times daily o Salonpas lidocaine patches 12 hours on, 12 hours off (at night) . Patient self care activities - Over the next 90 days, patient will: o Investigate TENS unit for pain relief potential   Medication management . Pharmacist Clinical  Goal(s): o Over the next 90 days, patient will work with PharmD and providers to maintain optimal medication adherence . [facility] . Interventions o Comprehensive medication review performed. o Patient stopped Premarin . Patient self care activities - Over the next 90 days, patient will: o Focus on medication adherence by providing feedback on transition to [facility] pharmacy o Take medications as prescribed o Report any questions or concerns to PharmD and/or provider(s)  Initial goal documentation     . DIET - INCREASE WATER INTAKE     Recommend drinking 6-8 glasses of water per day       This is a list of the screening recommended for you and due dates:  Health Maintenance  Topic Date Due  . Tetanus Vaccine  01/19/2028  . Flu Shot  Completed  . COVID-19 Vaccine  Completed  . DEXA scan (bone density measurement)  Addressed  . Pneumonia vaccines  Addressed  . HPV Vaccine  Aged Out

## 2020-10-22 DIAGNOSIS — M6281 Muscle weakness (generalized): Secondary | ICD-10-CM | POA: Diagnosis not present

## 2020-10-22 DIAGNOSIS — M170 Bilateral primary osteoarthritis of knee: Secondary | ICD-10-CM | POA: Diagnosis not present

## 2020-10-26 DIAGNOSIS — M5136 Other intervertebral disc degeneration, lumbar region: Secondary | ICD-10-CM | POA: Diagnosis not present

## 2020-10-26 DIAGNOSIS — M9903 Segmental and somatic dysfunction of lumbar region: Secondary | ICD-10-CM | POA: Diagnosis not present

## 2020-10-26 DIAGNOSIS — M62830 Muscle spasm of back: Secondary | ICD-10-CM | POA: Diagnosis not present

## 2020-10-26 DIAGNOSIS — M9901 Segmental and somatic dysfunction of cervical region: Secondary | ICD-10-CM | POA: Diagnosis not present

## 2020-10-27 ENCOUNTER — Ambulatory Visit: Payer: Medicare Other

## 2020-10-29 DIAGNOSIS — M6281 Muscle weakness (generalized): Secondary | ICD-10-CM | POA: Diagnosis not present

## 2020-10-29 DIAGNOSIS — M170 Bilateral primary osteoarthritis of knee: Secondary | ICD-10-CM | POA: Diagnosis not present

## 2020-10-30 ENCOUNTER — Ambulatory Visit (INDEPENDENT_AMBULATORY_CARE_PROVIDER_SITE_OTHER): Payer: Medicare Other | Admitting: Podiatry

## 2020-10-30 ENCOUNTER — Other Ambulatory Visit: Payer: Self-pay

## 2020-10-30 ENCOUNTER — Encounter: Payer: Self-pay | Admitting: Podiatry

## 2020-10-30 DIAGNOSIS — M76821 Posterior tibial tendinitis, right leg: Secondary | ICD-10-CM | POA: Diagnosis not present

## 2020-10-30 DIAGNOSIS — M76822 Posterior tibial tendinitis, left leg: Secondary | ICD-10-CM

## 2020-10-30 MED ORDER — BETAMETHASONE SOD PHOS & ACET 6 (3-3) MG/ML IJ SUSP
3.0000 mg | Freq: Once | INTRAMUSCULAR | Status: AC
  Administered 2020-10-30: 3 mg via INTRA_ARTICULAR

## 2020-10-30 NOTE — Progress Notes (Signed)
   Subjective: 87 y.o. female presenting today for follow up evaluation of bilateral foot pain.  Patient states that the pain to her bilateral feet and ankles has recurred.  She states that the injections helped significantly with the pain.  She is very active 87 year old female and wears Ortho feet shoes patient states that most recently she has been developing some right ankle pain.  She denies a history of injury.  She states that the injections helped significantly but they are only temporary. Past Medical History:  Diagnosis Date  . Anxiety   . Aortic aneurysm without rupture ([place])   . CAD (coronary artery disease)   . Chronic hip pain   . Endometriosis   . Hearing loss   . HTN, goal below 150/90   . Hyperlipidemia LDL goal <70 11/18/2014  . Osteopenia of the elderly   . Urge and stress incontinence      Objective: Physical Exam General: The patient is alert and oriented x3 in no acute distress.  Dermatology: Skin is cool, dry and supple bilateral lower extremities. Negative for open lesions or macerations.  Vascular: Palpable pedal pulses bilaterally. No edema or erythema noted. Capillary refill within normal limits.  Neurological: Epicritic and protective threshold grossly intact bilaterally.   Musculoskeletal Exam: Clinical evidence of bunion deformity noted to the respective foot. There is moderate pain on palpation range of motion of the first MPJ. Lateral deviation of the hallux noted consistent with hallux abductovalgus. Hammertoe contracture also noted on clinical exam to the 2nd digit of the left foot. Symptomatic pain on palpation and range of motion also noted to the metatarsal phalangeal joints of the respective hammertoe digits.   Pain on palpation noted to the posterior tibial tendon of the bilateral foot with medial longitudinal arch collapse noted.  Assessment: 1. HAV w/ bunion deformity left 2. Hammertoe deformity 2nd digit left 3. PTTD bilateral 4.  Posterior  tibial tendinitis bilateral 5.  Plantar fasciitis left-resolved 6.  Synovitis of ankle right   Plan of Care:  1. Patient was evaluated.  2. Injection of 0.5 mLs Celestone Soluspan injected into the posterior tibial tendon sheath bilateral  3.  Continue OTC Tylenol as needed 4. Continue wearing Orthofeet shoes.  5.  Continue OTC Voltaren topical gel 6.  OTC power step insoles were provided for the patient to apply to her Orthofeet shoes 7.  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     Edrick Kins, DPM [facility]  Dr. Edrick Kins, DPM    [address], [place] [address]                Office [phone]  Fax [phone]

## 2020-11-09 ENCOUNTER — Encounter: Payer: Self-pay | Admitting: Podiatry

## 2020-11-12 ENCOUNTER — Telehealth (INDEPENDENT_AMBULATORY_CARE_PROVIDER_SITE_OTHER): Payer: Medicare Other | Admitting: Family Medicine

## 2020-11-12 ENCOUNTER — Encounter: Payer: Self-pay | Admitting: Family Medicine

## 2020-11-12 ENCOUNTER — Other Ambulatory Visit: Payer: Self-pay

## 2020-11-12 VITALS — Wt 182.0 lb

## 2020-11-12 DIAGNOSIS — M76822 Posterior tibial tendinitis, left leg: Secondary | ICD-10-CM | POA: Diagnosis not present

## 2020-11-12 DIAGNOSIS — F418 Other specified anxiety disorders: Secondary | ICD-10-CM

## 2020-11-12 DIAGNOSIS — M76821 Posterior tibial tendinitis, right leg: Secondary | ICD-10-CM | POA: Insufficient documentation

## 2020-11-12 DIAGNOSIS — G8929 Other chronic pain: Secondary | ICD-10-CM | POA: Diagnosis not present

## 2020-11-12 DIAGNOSIS — I25119 Atherosclerotic heart disease of native coronary artery with unspecified angina pectoris: Secondary | ICD-10-CM | POA: Diagnosis not present

## 2020-11-12 DIAGNOSIS — M79671 Pain in right foot: Secondary | ICD-10-CM

## 2020-11-12 DIAGNOSIS — I10 Essential (primary) hypertension: Secondary | ICD-10-CM

## 2020-11-12 NOTE — Progress Notes (Signed)
   Subjective:  Documentation for virtual audio and video telecommunications through [place] encounter:  The patient was located at home. 2 patient identifiers used.  The provider was located in the office. The patient did consent to this visit and is aware of possible charges through their insurance for this visit.  The other persons participating in this telemedicine service were [place]. Time spent on call was 15 minutes and in review of previous records 20 minutes total.  This virtual service is not related to other E/M service within previous 7 days.   Patient ID: Angel [name], female    DOB: 07-May-1931, 87 y.o.   MRN: [id number]  HPI Chief Complaint  Patient presents with  . Tendonitis    Tendonitis in right foot and ankle. Would possibility want a referral somewhere else.    Complains of bilateral ankle and foot pain worse on right. States she has been told she has tendonitis.  States she has flat feet.  History of plantar fascitis and falling arches.  States she has been seeing podiatry, Dr. Amalia Hailey and recently had injections in both feet. These helped briefly.   She has tried meloxicam, tylenol, Voltaren gel and and ortho feet shoes.  She has seen her chiropractor.   States she is having worsening pain and this is causing her anxiety which is also increasing her BP. She has Xanax at home and uses this sparingly. Questions whether she should take a dose today.     Reviewed allergies, medications, past medical, surgical, family, and social history.  Review of Systems Pertinent positives and negatives in the history of present illness.     Objective:   Physical Exam Wt 182 lb (82.6 kg)   LMP  (LMP Unknown)   BMI 32.76 kg/m   Alert and oriented in no acute distress.  Respirations unlabored.      Assessment & Plan:  Posterior tibial tendinitis of both lower extremities - Plan: Ambulatory referral to Physical Therapy  Chronic foot pain, right  Situational  anxiety  Hypertension goal BP (blood pressure) < 140/90  Referral to [facility], [place] for evaluation and treatment including TENS if recommended.  If she is not getting adequate relief, we may consider referral to ortho.  Discussed that she may try the Xanax that she has at home due to anxiety provoked by pain which is also increasing her blood pressure.  She will use this sparingly and is aware of the sedating nature of the medication.

## 2020-11-24 DIAGNOSIS — M25471 Effusion, right ankle: Secondary | ICD-10-CM | POA: Diagnosis not present

## 2020-11-24 DIAGNOSIS — M25671 Stiffness of right ankle, not elsewhere classified: Secondary | ICD-10-CM | POA: Diagnosis not present

## 2020-11-24 DIAGNOSIS — M25571 Pain in right ankle and joints of right foot: Secondary | ICD-10-CM | POA: Diagnosis not present

## 2020-11-24 DIAGNOSIS — I10 Essential (primary) hypertension: Secondary | ICD-10-CM | POA: Diagnosis not present

## 2020-11-24 DIAGNOSIS — M62561 Muscle wasting and atrophy, not elsewhere classified, right lower leg: Secondary | ICD-10-CM | POA: Diagnosis not present

## 2020-11-24 DIAGNOSIS — M25572 Pain in left ankle and joints of left foot: Secondary | ICD-10-CM | POA: Diagnosis not present

## 2020-11-24 DIAGNOSIS — R269 Unspecified abnormalities of gait and mobility: Secondary | ICD-10-CM | POA: Diagnosis not present

## 2020-11-24 DIAGNOSIS — E669 Obesity, unspecified: Secondary | ICD-10-CM | POA: Diagnosis not present

## 2020-11-26 ENCOUNTER — Other Ambulatory Visit: Payer: Self-pay | Admitting: Interventional Cardiology

## 2020-12-01 ENCOUNTER — Telehealth: Payer: Self-pay | Admitting: Internal Medicine

## 2020-12-01 DIAGNOSIS — M25572 Pain in left ankle and joints of left foot: Secondary | ICD-10-CM | POA: Diagnosis not present

## 2020-12-01 DIAGNOSIS — I10 Essential (primary) hypertension: Secondary | ICD-10-CM | POA: Diagnosis not present

## 2020-12-01 DIAGNOSIS — M25671 Stiffness of right ankle, not elsewhere classified: Secondary | ICD-10-CM | POA: Diagnosis not present

## 2020-12-01 DIAGNOSIS — M25571 Pain in right ankle and joints of right foot: Secondary | ICD-10-CM | POA: Diagnosis not present

## 2020-12-01 DIAGNOSIS — M62561 Muscle wasting and atrophy, not elsewhere classified, right lower leg: Secondary | ICD-10-CM | POA: Diagnosis not present

## 2020-12-01 DIAGNOSIS — R269 Unspecified abnormalities of gait and mobility: Secondary | ICD-10-CM | POA: Diagnosis not present

## 2020-12-01 DIAGNOSIS — M25471 Effusion, right ankle: Secondary | ICD-10-CM | POA: Diagnosis not present

## 2020-12-01 DIAGNOSIS — E669 Obesity, unspecified: Secondary | ICD-10-CM | POA: Diagnosis not present

## 2020-12-01 NOTE — Telephone Encounter (Signed)
Pt has been seeing [facility] for PT and therapist told her to try voltaren gel and she wanted to let you know incase you had any issues with her using this. She would like [place] to reply to her as a Pharmacist, community message instead of calling her back.

## 2020-12-01 NOTE — Telephone Encounter (Signed)
I am fine with her using this.

## 2020-12-04 ENCOUNTER — Encounter: Payer: Self-pay | Admitting: Family Medicine

## 2020-12-04 ENCOUNTER — Telehealth (INDEPENDENT_AMBULATORY_CARE_PROVIDER_SITE_OTHER): Payer: Medicare Other | Admitting: Family Medicine

## 2020-12-04 ENCOUNTER — Other Ambulatory Visit: Payer: Self-pay

## 2020-12-04 VITALS — Wt 182.0 lb

## 2020-12-04 DIAGNOSIS — I10 Essential (primary) hypertension: Secondary | ICD-10-CM | POA: Diagnosis not present

## 2020-12-04 DIAGNOSIS — R262 Difficulty in walking, not elsewhere classified: Secondary | ICD-10-CM

## 2020-12-04 DIAGNOSIS — Z8673 Personal history of transient ischemic attack (TIA), and cerebral infarction without residual deficits: Secondary | ICD-10-CM | POA: Insufficient documentation

## 2020-12-04 DIAGNOSIS — M7750 Other enthesopathy of unspecified foot: Secondary | ICD-10-CM

## 2020-12-04 NOTE — Progress Notes (Signed)
   Subjective:  Documentation for virtual telephone encounter.  The patient was located at home. The provider was located in the office. The patient did consent to this visit and is aware of possible charges through their insurance for this visit.  The other persons participating in this telemedicine service were none. Time spent on call was 12 minutes and in review of previous records 15 minutes total.  This virtual service is not related to other E/M service within previous 7 days.  99441 (5-44min) 99442 (11-10min) 99443 (21-77min)   Patient ID: Angel [name], female    DOB: [birth year], 87 y.o.   MRN: [id number]  HPI Chief Complaint  Patient presents with  . discuss physical therapy    Discuss physical therapy. Dry needling done at [facility]. Having a lot of issues moving at home. Wants to see if PT can come to the house   She is currently having PT at [facility] for tendonitis in her right lower leg and into the foot. States the PT has been helping but then 2 days ago she had to sit in 2 long meetings for work and this has flared up her pain significantly.   States she has not been able to put any weight on her right leg for the past 2 days. She is unable to get to [facility] now due to increasing pain. States she is barely able to walk down her hallway to the bathroom.  No focal weakness or numbness.   She is using ice, elevating and using a compression. She is taking ibuprofen and Tylenol.    No other concerns.   No fever, chills, chest pain, palpitations, shortness of breath, N/V/D.    Review of Systems Pertinent positives and negatives in the history of present illness.     Objective:   Physical Exam Wt 182 lb (82.6 kg)   LMP  (LMP Unknown)   BMI 32.76 kg/m   And oriented and in no acute distress.  She is speaking in complete sentences without difficulty.  Normal speech, mood and thought process.      Assessment & Plan:  Tendonitis of ankle or foot - Plan:  Ambulatory referral to [facility]  Inability to walk - Plan: Ambulatory referral to Home Health  Hypertension goal BP (blood pressure) < 140/90  History of CVA (cerebrovascular accident) - Plan: Ambulatory referral to [facility]  She seems to have had a setback with the tendinitis in her lower leg and foot and currently is unable to go to physical therapy.  She would benefit from in-home physical therapy which I will order today.  She will continue with RICE therapy and ibuprofen alternating with Tylenol for pain management.  She is aware that if her pain is not improving that we may need to have her follow-up with her orthopedist.

## 2020-12-08 DIAGNOSIS — M65261 Calcific tendinitis, right lower leg: Secondary | ICD-10-CM | POA: Diagnosis not present

## 2020-12-08 DIAGNOSIS — Z8673 Personal history of transient ischemic attack (TIA), and cerebral infarction without residual deficits: Secondary | ICD-10-CM | POA: Diagnosis not present

## 2020-12-08 DIAGNOSIS — R262 Difficulty in walking, not elsewhere classified: Secondary | ICD-10-CM | POA: Diagnosis not present

## 2020-12-08 DIAGNOSIS — I10 Essential (primary) hypertension: Secondary | ICD-10-CM | POA: Diagnosis not present

## 2020-12-08 DIAGNOSIS — M65271 Calcific tendinitis, right ankle and foot: Secondary | ICD-10-CM | POA: Diagnosis not present

## 2020-12-08 DIAGNOSIS — Z741 Need for assistance with personal care: Secondary | ICD-10-CM | POA: Diagnosis not present

## 2020-12-08 DIAGNOSIS — Z9181 History of falling: Secondary | ICD-10-CM | POA: Diagnosis not present

## 2020-12-09 ENCOUNTER — Telehealth: Payer: Self-pay | Admitting: Internal Medicine

## 2020-12-09 DIAGNOSIS — Z9181 History of falling: Secondary | ICD-10-CM | POA: Diagnosis not present

## 2020-12-09 DIAGNOSIS — Z741 Need for assistance with personal care: Secondary | ICD-10-CM | POA: Diagnosis not present

## 2020-12-09 DIAGNOSIS — M65261 Calcific tendinitis, right lower leg: Secondary | ICD-10-CM | POA: Diagnosis not present

## 2020-12-09 DIAGNOSIS — R262 Difficulty in walking, not elsewhere classified: Secondary | ICD-10-CM | POA: Diagnosis not present

## 2020-12-09 DIAGNOSIS — I10 Essential (primary) hypertension: Secondary | ICD-10-CM | POA: Diagnosis not present

## 2020-12-09 DIAGNOSIS — M65271 Calcific tendinitis, right ankle and foot: Secondary | ICD-10-CM | POA: Diagnosis not present

## 2020-12-09 NOTE — Telephone Encounter (Signed)
Gave stacy verbal orders.  However pt is already taking ibuprofen and tylenol and has voltaren gel over the counter and been using it for about 7 days and no relief. Please advise

## 2020-12-09 NOTE — Telephone Encounter (Signed)
Ok to give the orders for therapy. If she is not taking an NSAID then which one has she taken in the past that helped? Options include Aleve 1 tablet daily,  ibuprofen 600 mg or I can send in a prescription for meloxicam or diclofenac, I am not sure if she has taken one of these in the past or not. Please ask. I am ok with her taking one short term only since these medications have the potential for side effects including GI issues, bleeding, etc.

## 2020-12-09 NOTE — Telephone Encounter (Signed)
If PT is not helping and she needs additional pain treatment, I would like for her to call her orthopedist. She and I did discuss that she might need to get them involved with pain management.

## 2020-12-09 NOTE — Telephone Encounter (Signed)
Pt was notified. She is going to try a couple weeks of PT to see if that helps before she does anything else.

## 2020-12-09 NOTE — Telephone Encounter (Signed)
Jobe Gibbon from [facility] needs verbal orders for PT for 2x for 4 weeks and then 1x for 4 weeks. She also wants to know if she would benefit from anti- inflammatory medicine for her tendinitis as she is in a deal of pain. Please advise

## 2020-12-10 ENCOUNTER — Telehealth: Payer: Self-pay

## 2020-12-10 ENCOUNTER — Telehealth: Payer: Self-pay | Admitting: Interventional Cardiology

## 2020-12-10 NOTE — Telephone Encounter (Signed)
Pt c/o medication issue:  1. Name of Medication: atorvastatin (LIPITOR) 80 MG tablet  2. How are you currently taking this medication (dosage and times per day)? Not sure if she needs to take 40 mg or 80 mg  3. Are you having a reaction (difficulty breathing--STAT)? no  4. What is your medication issue? Patient states she is out of medication. She states she was in the hospital and the prescription was changed. She states she is not sure if she needs to be taking 40 mg or 80 mg. She states she would like a call back to know and also would [facility] [place].

## 2020-12-10 NOTE — Telephone Encounter (Signed)
Spoke with pt and explained that she should be taking Atorvastatin 80mg  QD.  We discussed why her dose was changed.  Pt agreeable to continue the 80mg  dose.  Pt appreciative for call.

## 2020-12-10 NOTE — Telephone Encounter (Signed)
See other phone note

## 2020-12-10 NOTE — Telephone Encounter (Signed)
Pt calling stating that her pharmacy needs clarification for medication atorvastatin. Does pt supposed to be taking atorvastatin 40 mg or 80 mg tablets. Pt would like a call back concerning this matter. Please address

## 2020-12-11 ENCOUNTER — Telehealth: Payer: Self-pay

## 2020-12-11 ENCOUNTER — Other Ambulatory Visit: Payer: Self-pay

## 2020-12-11 ENCOUNTER — Ambulatory Visit
Admission: RE | Admit: 2020-12-11 | Discharge: 2020-12-11 | Disposition: A | Discharge status: Home / Self Care | Payer: Medicare Other | Source: Ambulatory Visit | Attending: Family Medicine | Admitting: Family Medicine

## 2020-12-11 DIAGNOSIS — M25571 Pain in right ankle and joints of right foot: Secondary | ICD-10-CM | POA: Diagnosis not present

## 2020-12-11 DIAGNOSIS — M7750 Other enthesopathy of unspecified foot: Secondary | ICD-10-CM

## 2020-12-11 NOTE — Telephone Encounter (Signed)
Angel [name] was advised to go get xray.   [name]  I have put in order for ankle and foot since she was dx was ankle foot

## 2020-12-11 NOTE — Telephone Encounter (Signed)
Pt. significant other called stating she wanted to know if she could get an x-ray of her rt. Ankle. She has been doing her PT for her ankle but the PT stated she thought she needed to get her rt. Ankle x-rayed. Please call Trenda Moots pt. Emergency contact [phone].

## 2020-12-11 NOTE — Telephone Encounter (Signed)
We can order the XR but since she is not making any progress I think it is time to contact her orthopedist or podiatrist (her preference) to help [place] get her pain under better control.

## 2020-12-14 ENCOUNTER — Encounter: Payer: Self-pay | Admitting: Family Medicine

## 2020-12-14 ENCOUNTER — Telehealth: Payer: Self-pay

## 2020-12-14 DIAGNOSIS — M7750 Other enthesopathy of unspecified foot: Secondary | ICD-10-CM

## 2020-12-14 NOTE — Telephone Encounter (Signed)
I have put the referral in ---  

## 2020-12-14 NOTE — Addendum Note (Signed)
Addended by: Minette Headland A on: 12/14/2020 11:15 AM   Modules accepted: Orders

## 2020-12-14 NOTE — Telephone Encounter (Signed)
Ok to put in referral.  Thanks.    

## 2020-12-14 NOTE — Progress Notes (Signed)
Please let her know that her X rays are unremarkable and nothing is showing up here to explain her symptoms. I encourage her to call her orthopedist or podiatrist or both today to see if they need to do any further testing or treatment.

## 2020-12-14 NOTE — Telephone Encounter (Signed)
Pt. significant other called stated she needs a referral to podiatry for her foot her ortho doctor does not work on feet.

## 2020-12-15 ENCOUNTER — Encounter: Payer: Self-pay | Admitting: Family Medicine

## 2020-12-15 ENCOUNTER — Ambulatory Visit: Payer: Medicare Other | Admitting: Adult Health

## 2020-12-15 DIAGNOSIS — S93491A Sprain of other ligament of right ankle, initial encounter: Secondary | ICD-10-CM | POA: Diagnosis not present

## 2020-12-15 DIAGNOSIS — M810 Age-related osteoporosis without current pathological fracture: Secondary | ICD-10-CM | POA: Diagnosis not present

## 2020-12-15 DIAGNOSIS — E559 Vitamin D deficiency, unspecified: Secondary | ICD-10-CM | POA: Diagnosis not present

## 2020-12-15 DIAGNOSIS — M76821 Posterior tibial tendinitis, right leg: Secondary | ICD-10-CM | POA: Diagnosis not present

## 2020-12-15 DIAGNOSIS — R5383 Other fatigue: Secondary | ICD-10-CM | POA: Diagnosis not present

## 2020-12-17 DIAGNOSIS — M65271 Calcific tendinitis, right ankle and foot: Secondary | ICD-10-CM | POA: Diagnosis not present

## 2020-12-17 DIAGNOSIS — R262 Difficulty in walking, not elsewhere classified: Secondary | ICD-10-CM | POA: Diagnosis not present

## 2020-12-17 DIAGNOSIS — I10 Essential (primary) hypertension: Secondary | ICD-10-CM | POA: Diagnosis not present

## 2020-12-17 DIAGNOSIS — Z741 Need for assistance with personal care: Secondary | ICD-10-CM | POA: Diagnosis not present

## 2020-12-17 DIAGNOSIS — M65261 Calcific tendinitis, right lower leg: Secondary | ICD-10-CM | POA: Diagnosis not present

## 2020-12-17 DIAGNOSIS — Z9181 History of falling: Secondary | ICD-10-CM | POA: Diagnosis not present

## 2020-12-29 DIAGNOSIS — M76821 Posterior tibial tendinitis, right leg: Secondary | ICD-10-CM | POA: Diagnosis not present

## 2021-01-01 DIAGNOSIS — M65271 Calcific tendinitis, right ankle and foot: Secondary | ICD-10-CM | POA: Diagnosis not present

## 2021-01-01 DIAGNOSIS — R262 Difficulty in walking, not elsewhere classified: Secondary | ICD-10-CM | POA: Diagnosis not present

## 2021-01-01 DIAGNOSIS — M65261 Calcific tendinitis, right lower leg: Secondary | ICD-10-CM | POA: Diagnosis not present

## 2021-01-01 DIAGNOSIS — Z741 Need for assistance with personal care: Secondary | ICD-10-CM | POA: Diagnosis not present

## 2021-01-01 DIAGNOSIS — I10 Essential (primary) hypertension: Secondary | ICD-10-CM | POA: Diagnosis not present

## 2021-01-01 DIAGNOSIS — Z9181 History of falling: Secondary | ICD-10-CM | POA: Diagnosis not present

## 2021-01-07 DIAGNOSIS — Z741 Need for assistance with personal care: Secondary | ICD-10-CM | POA: Diagnosis not present

## 2021-01-07 DIAGNOSIS — R262 Difficulty in walking, not elsewhere classified: Secondary | ICD-10-CM | POA: Diagnosis not present

## 2021-01-07 DIAGNOSIS — M65261 Calcific tendinitis, right lower leg: Secondary | ICD-10-CM | POA: Diagnosis not present

## 2021-01-07 DIAGNOSIS — Z9181 History of falling: Secondary | ICD-10-CM | POA: Diagnosis not present

## 2021-01-07 DIAGNOSIS — Z8673 Personal history of transient ischemic attack (TIA), and cerebral infarction without residual deficits: Secondary | ICD-10-CM | POA: Diagnosis not present

## 2021-01-07 DIAGNOSIS — M65271 Calcific tendinitis, right ankle and foot: Secondary | ICD-10-CM | POA: Diagnosis not present

## 2021-01-07 DIAGNOSIS — I10 Essential (primary) hypertension: Secondary | ICD-10-CM | POA: Diagnosis not present

## 2021-01-11 DIAGNOSIS — I10 Essential (primary) hypertension: Secondary | ICD-10-CM | POA: Diagnosis not present

## 2021-01-11 DIAGNOSIS — Z9181 History of falling: Secondary | ICD-10-CM | POA: Diagnosis not present

## 2021-01-11 DIAGNOSIS — Z741 Need for assistance with personal care: Secondary | ICD-10-CM | POA: Diagnosis not present

## 2021-01-11 DIAGNOSIS — M65271 Calcific tendinitis, right ankle and foot: Secondary | ICD-10-CM | POA: Diagnosis not present

## 2021-01-11 DIAGNOSIS — M65261 Calcific tendinitis, right lower leg: Secondary | ICD-10-CM | POA: Diagnosis not present

## 2021-01-11 DIAGNOSIS — R262 Difficulty in walking, not elsewhere classified: Secondary | ICD-10-CM | POA: Diagnosis not present

## 2021-01-14 DIAGNOSIS — R262 Difficulty in walking, not elsewhere classified: Secondary | ICD-10-CM | POA: Diagnosis not present

## 2021-01-14 DIAGNOSIS — Z741 Need for assistance with personal care: Secondary | ICD-10-CM | POA: Diagnosis not present

## 2021-01-14 DIAGNOSIS — I10 Essential (primary) hypertension: Secondary | ICD-10-CM | POA: Diagnosis not present

## 2021-01-14 DIAGNOSIS — M65271 Calcific tendinitis, right ankle and foot: Secondary | ICD-10-CM | POA: Diagnosis not present

## 2021-01-14 DIAGNOSIS — Z9181 History of falling: Secondary | ICD-10-CM | POA: Diagnosis not present

## 2021-01-14 DIAGNOSIS — M65261 Calcific tendinitis, right lower leg: Secondary | ICD-10-CM | POA: Diagnosis not present

## 2021-01-18 DIAGNOSIS — Z23 Encounter for immunization: Secondary | ICD-10-CM | POA: Diagnosis not present

## 2021-01-20 DIAGNOSIS — R262 Difficulty in walking, not elsewhere classified: Secondary | ICD-10-CM | POA: Diagnosis not present

## 2021-01-20 DIAGNOSIS — Z741 Need for assistance with personal care: Secondary | ICD-10-CM | POA: Diagnosis not present

## 2021-01-20 DIAGNOSIS — Z9181 History of falling: Secondary | ICD-10-CM | POA: Diagnosis not present

## 2021-01-20 DIAGNOSIS — M65261 Calcific tendinitis, right lower leg: Secondary | ICD-10-CM | POA: Diagnosis not present

## 2021-01-20 DIAGNOSIS — M65271 Calcific tendinitis, right ankle and foot: Secondary | ICD-10-CM | POA: Diagnosis not present

## 2021-01-20 DIAGNOSIS — I10 Essential (primary) hypertension: Secondary | ICD-10-CM | POA: Diagnosis not present

## 2021-01-22 DIAGNOSIS — H6123 Impacted cerumen, bilateral: Secondary | ICD-10-CM | POA: Diagnosis not present

## 2021-01-25 DIAGNOSIS — M65271 Calcific tendinitis, right ankle and foot: Secondary | ICD-10-CM | POA: Diagnosis not present

## 2021-01-25 DIAGNOSIS — Z9181 History of falling: Secondary | ICD-10-CM | POA: Diagnosis not present

## 2021-01-25 DIAGNOSIS — R262 Difficulty in walking, not elsewhere classified: Secondary | ICD-10-CM | POA: Diagnosis not present

## 2021-01-25 DIAGNOSIS — Z741 Need for assistance with personal care: Secondary | ICD-10-CM | POA: Diagnosis not present

## 2021-01-25 DIAGNOSIS — M65261 Calcific tendinitis, right lower leg: Secondary | ICD-10-CM | POA: Diagnosis not present

## 2021-01-25 DIAGNOSIS — I10 Essential (primary) hypertension: Secondary | ICD-10-CM | POA: Diagnosis not present

## 2021-01-26 DIAGNOSIS — S93491D Sprain of other ligament of right ankle, subsequent encounter: Secondary | ICD-10-CM | POA: Diagnosis not present

## 2021-01-26 DIAGNOSIS — M76821 Posterior tibial tendinitis, right leg: Secondary | ICD-10-CM | POA: Diagnosis not present

## 2021-02-05 DIAGNOSIS — Z9181 History of falling: Secondary | ICD-10-CM | POA: Diagnosis not present

## 2021-02-05 DIAGNOSIS — R262 Difficulty in walking, not elsewhere classified: Secondary | ICD-10-CM | POA: Diagnosis not present

## 2021-02-05 DIAGNOSIS — I10 Essential (primary) hypertension: Secondary | ICD-10-CM | POA: Diagnosis not present

## 2021-02-05 DIAGNOSIS — M65261 Calcific tendinitis, right lower leg: Secondary | ICD-10-CM | POA: Diagnosis not present

## 2021-02-05 DIAGNOSIS — M65271 Calcific tendinitis, right ankle and foot: Secondary | ICD-10-CM | POA: Diagnosis not present

## 2021-02-05 DIAGNOSIS — Z741 Need for assistance with personal care: Secondary | ICD-10-CM | POA: Diagnosis not present

## 2021-02-10 ENCOUNTER — Encounter: Payer: Self-pay | Admitting: Internal Medicine

## 2021-03-01 ENCOUNTER — Other Ambulatory Visit: Payer: Self-pay | Admitting: Interventional Cardiology

## 2021-03-03 ENCOUNTER — Other Ambulatory Visit: Payer: Self-pay | Admitting: Interventional Cardiology

## 2021-03-08 DIAGNOSIS — M9903 Segmental and somatic dysfunction of lumbar region: Secondary | ICD-10-CM | POA: Diagnosis not present

## 2021-03-08 DIAGNOSIS — M62830 Muscle spasm of back: Secondary | ICD-10-CM | POA: Diagnosis not present

## 2021-03-08 DIAGNOSIS — M5136 Other intervertebral disc degeneration, lumbar region: Secondary | ICD-10-CM | POA: Diagnosis not present

## 2021-03-08 DIAGNOSIS — M9901 Segmental and somatic dysfunction of cervical region: Secondary | ICD-10-CM | POA: Diagnosis not present

## 2021-03-09 ENCOUNTER — Other Ambulatory Visit: Payer: Self-pay

## 2021-03-09 ENCOUNTER — Telehealth (INDEPENDENT_AMBULATORY_CARE_PROVIDER_SITE_OTHER): Payer: Medicare Other | Admitting: Medical

## 2021-03-09 VITALS — BP 139/90 | Temp 96.2°F | Wt 186.0 lb

## 2021-03-09 DIAGNOSIS — R059 Cough, unspecified: Secondary | ICD-10-CM | POA: Diagnosis not present

## 2021-03-09 DIAGNOSIS — U071 COVID-19: Secondary | ICD-10-CM | POA: Diagnosis not present

## 2021-03-09 DIAGNOSIS — I25119 Atherosclerotic heart disease of native coronary artery with unspecified angina pectoris: Secondary | ICD-10-CM

## 2021-03-09 MED ORDER — BENZONATATE 200 MG PO CAPS
200.0000 mg | ORAL_CAPSULE | Freq: Three times a day (TID) | ORAL | 0 refills | Status: DC | PRN
Start: 2021-03-09 — End: 2021-11-10

## 2021-03-09 MED ORDER — MOLNUPIRAVIR EUA 200MG CAPSULE: 4.0000 | ORAL_CAPSULE | Freq: Two times a day (BID) | ORAL | 0 refills | Status: AC

## 2021-03-09 NOTE — Progress Notes (Signed)
Subjective:     Patient ID: Angel [name], female   DOB: Jan 03, 1931, 87 y.o.   MRN: [id number]  This visit type was conducted due to national recommendations for restrictions regarding the COVID-19 Pandemic (e.g. social distancing) in an effort to limit this patient's exposure and mitigate transmission in our community.  Due to their co-morbid illnesses, this patient is at least at moderate risk for complications without adequate follow up.  This format is felt to be most appropriate for this patient at this time.    Documentation for virtual audio and video telecommunications through [place] encounter:  The patient was located at home. The provider was located in the office. The patient did consent to this visit and is aware of possible charges through their insurance for this visit.  The other persons participating in this telemedicine service were none. Time spent on call was 20 minutes and in review of previous records 20 minutes total.  This virtual service is not related to other E/M service within previous 7 days.   HPI Chief Complaint  Patient presents with   Covid Positive    Covid positive since yesterday 8/1. Symptoms- stuffy nose, scratchy throat, cough, some dizziness yesterday, chest discomfort   Virtual consult for covid illness.  She started feeling bad 4 days ago with exhaustion but then the next day started getting cold symptoms.  She has not developed a fever.  No sob, no wheezing.   No NVD.  Has some belly discomfort.  No chills or body aches.  Overall feels like she has a cold.  She tested positive for COVID yesterday.  No prior COVID illness.  She has had shots and boosters  She lives alone.  No sick contacts.  She normally fights off infections pretty well.  She does not get sick that often.  Otherwise in normal state of health prior to the symptoms  No other aggravating or relieving factors. No other complaint.    Past Medical History:  Diagnosis  Date   Anxiety    Aortic aneurysm without rupture (HCC)    CAD (coronary artery disease)    Chronic hip pain    Endometriosis    Hearing loss    HTN, goal below 150/90    Hyperlipidemia LDL goal <70 11/18/2014   Osteopenia of the elderly    Urge and stress incontinence    Current Outpatient Medications on File Prior to Visit  Medication Sig Dispense Refill   amLODipine (NORVASC) 5 MG tablet Take 1 tablet (5 mg total) by mouth daily. 90 tablet 3   Ascorbic Acid (VITAMIN C) 100 MG tablet Take 500 mg by mouth daily. Gummy '750mg'$      aspirin EC 81 MG EC tablet Take 1 tablet (81 mg total) by mouth daily. Swallow whole. 30 tablet 11   atorvastatin (LIPITOR) 80 MG tablet Take 1 tablet (80 mg total) by mouth daily. 90 tablet 3   chlorthalidone (HYGROTON) 25 MG tablet Take 0.5 tablets (12.5 mg total) by mouth daily. 45 tablet 3   lisinopril (ZESTRIL) 40 MG tablet Take 1 tablet (40 mg total) by mouth daily.     metoprolol succinate (TOPROL-XL) 100 MG 24 hr tablet Take 1 tablet (100 mg total) by mouth daily. Take with or immediately following a meal. 90 tablet 3   Multiple Vitamin (MULTIVITAMIN) capsule Take 1 capsule by mouth daily.     vitamin B-12 (CYANOCOBALAMIN) 100 MCG tablet Take 1,000 mcg by mouth daily.      VITAMIN  D, ERGOCALCIFEROL, PO Take 1,000 Units by mouth daily.     ALPRAZolam (XANAX) 0.25 MG tablet Take 1 tablet up to twice daily as needed for anxiety episodes.  Use sparingly and not to take it every day. (Patient not taking: Reported on 03/09/2021) 20 tablet 1   nitroGLYCERIN (NITROSTAT) 0.4 MG SL tablet Place 1 tablet (0.4 mg total) under the tongue every 5 (five) minutes as needed for chest pain. (Patient not taking: No sig reported) 25 tablet 3   Omega-3 Fatty Acids (FISH OIL) 1000 MG CAPS Take by mouth. Krill oil (Patient not taking: Reported on 03/09/2021)     No current facility-administered medications on file prior to visit.    Review of Systems As in subjective     Objective:   Physical Exam Due to coronavirus pandemic stay at home measures, patient visit was virtual and they were not examined in person.   BP 139/90   Temp (!) 96.2 F (35.7 C)   Wt 186 lb (84.4 kg)   LMP  (LMP Unknown)   SpO2 96%   BMI 33.48 kg/m   Gen: nad No labored breathign or wheezing Answers questions appropriately      Assessment:     Encounter Diagnoses  Name Primary?   COVID-19 Yes   Cough        Plan:     We discussed the COVID-positive, symptoms and usual timeframe for symptoms to improve.  Due to her higher risk for severe disease, I did recommend Molnupiravir.  We discussed that medication along with Tessalon Perles.  We discussed proper use and goals of medication.  We discussed hydration and rest.  If worsening or new symptoms over the next few days then call back.  If much worse go to the emergency dept.   General recommendations: I recommend you rest, hydrate well with water and clear fluids throughout the day.   You can use Tylenol for pain or fever You can use over the counter Emetrol for nausea.     If you are having trouble breathing, if you are very weak, have high fever 103 or higher consistently despite Tylenol, or uncontrollable nausea and vomiting, then call or go to the emergency department.    If you have other questions or have other symptoms or questions you are concerned about then please make a virtual visit  Covid symptoms such as fatigue and cough can linger over 2 weeks, even after the initial fever, aches, chills, and other initial symptoms.   Self Quarantine: The CDC, Centers for Disease Control has recommended a self quarantine of 5 days from the start of your illness until you are symptom-free including at least 24 hours of no symptoms including no fever, no shortness of breath, and no body aches and chills, by day 5 before returning to work or general contact with the public.  What does self quarantine mean: avoiding  contact with people as much as possible.   Particularly in your house, isolate your self from others in a separate room, wear a mask when possible in the room, particularly if coughing a lot.   Have others bring food, water, medications, etc., to your door, but avoid direct contact with your household contacts during this time to avoid spreading the infection to them.   If you have a separate bathroom and living quarters during the next 2 weeks away from others, that would be preferable.    If you can't completely isolate, then wear a mask, wash  hands frequently with soap and water for at least 15 seconds, minimize close contact with others, and have a friend or family member check regularly from a distance to make sure you are not getting seriously worse.     You should not be going out in public, should not be going to stores, to work or other public places until all your symptoms have resolved and at least 5 days + 24 hours of no symptoms at all have transpired.   Ideally you should avoid contact with others for a full 5 days if possible.  One of the goals is to limit spread to high risk people; people that are older and elderly, people with multiple health issues like diabetes, heart disease, lung disease, and anybody that has weakened immune systems such as people with cancer or on immunosuppressive therapy.     Drayah was seen today for covid positive.  Diagnoses and all orders for this visit:  COVID-19  Cough  Other orders -     molnupiravir EUA 200 mg CAPS; Take 4 capsules (800 mg total) by mouth 2 (two) times daily for 5 days. -     benzonatate (TESSALON) 200 MG capsule; Take 1 capsule (200 mg total) by mouth 3 (three) times daily as needed for cough.  F/u prn

## 2021-03-19 ENCOUNTER — Other Ambulatory Visit: Payer: Self-pay | Admitting: Interventional Cardiology

## 2021-03-23 DIAGNOSIS — M5136 Other intervertebral disc degeneration, lumbar region: Secondary | ICD-10-CM | POA: Diagnosis not present

## 2021-03-23 DIAGNOSIS — M62830 Muscle spasm of back: Secondary | ICD-10-CM | POA: Diagnosis not present

## 2021-03-23 DIAGNOSIS — M9901 Segmental and somatic dysfunction of cervical region: Secondary | ICD-10-CM | POA: Diagnosis not present

## 2021-03-23 DIAGNOSIS — M9903 Segmental and somatic dysfunction of lumbar region: Secondary | ICD-10-CM | POA: Diagnosis not present

## 2021-04-07 DIAGNOSIS — M5136 Other intervertebral disc degeneration, lumbar region: Secondary | ICD-10-CM | POA: Diagnosis not present

## 2021-04-07 DIAGNOSIS — M9903 Segmental and somatic dysfunction of lumbar region: Secondary | ICD-10-CM | POA: Diagnosis not present

## 2021-04-07 DIAGNOSIS — M62830 Muscle spasm of back: Secondary | ICD-10-CM | POA: Diagnosis not present

## 2021-04-07 DIAGNOSIS — M9901 Segmental and somatic dysfunction of cervical region: Secondary | ICD-10-CM | POA: Diagnosis not present

## 2021-04-21 ENCOUNTER — Encounter: Payer: Self-pay | Admitting: Interventional Cardiology

## 2021-04-21 ENCOUNTER — Ambulatory Visit (INDEPENDENT_AMBULATORY_CARE_PROVIDER_SITE_OTHER): Payer: Medicare Other | Admitting: Interventional Cardiology

## 2021-04-21 ENCOUNTER — Other Ambulatory Visit: Payer: Self-pay

## 2021-04-21 VITALS — BP 112/80 | HR 87 | Ht 62.5 in | Wt 195.6 lb

## 2021-04-21 DIAGNOSIS — I25119 Atherosclerotic heart disease of native coronary artery with unspecified angina pectoris: Secondary | ICD-10-CM

## 2021-04-21 DIAGNOSIS — E785 Hyperlipidemia, unspecified: Secondary | ICD-10-CM | POA: Diagnosis not present

## 2021-04-21 DIAGNOSIS — I10 Essential (primary) hypertension: Secondary | ICD-10-CM

## 2021-04-21 DIAGNOSIS — I350 Nonrheumatic aortic (valve) stenosis: Secondary | ICD-10-CM | POA: Diagnosis not present

## 2021-04-21 NOTE — Progress Notes (Signed)
Cardiology Office Note:    Date:  04/21/2021   ID:  Angel [name], [place] Dec 19, 1930, MRN [id number]  PCP:  Girtha Rm, NP-C  Cardiologist:  Sinclair Grooms, MD   Referring MD: Girtha Rm, NP-C   Chief Complaint  Patient presents with   Coronary Artery Disease    History of Present Illness:    Angel [name] is a 87 y.o. female with a hx of  poor R-wave progression on EKG, prior history of abnormal myocardial perfusion study, question of small apical infarct or diverticulum by cardiac MRI, essential hypertension, hyperlipidemia, aortic stenosis and aortic enlargement. 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  Has not had dyspnea, chest pain, syncope, orthopnea, PND, or lower extremity swelling.  She stays active.  Because of tendinitis in the right foot, she has had some difficulty with ambulation and balance.  She still works.  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  She has a great family support system.  She is happy with her lifestyle.  9 grandchildren and 2 great-grandchildren.  Past Medical History:  Diagnosis Date   Anxiety    Aortic aneurysm without rupture (HCC)    CAD (coronary artery disease)    Chronic hip pain    Endometriosis    Hearing loss    HTN, goal below 150/90    Hyperlipidemia LDL goal <70 11/18/2014   Osteopenia of the elderly    Urge and stress incontinence     Past Surgical History:  Procedure Laterality Date   ABDOMINAL HYSTERECTOMY  1976   total   APPENDECTOMY     BREAST EXCISIONAL BIOPSY Left 1987   benign   CATARACT EXTRACTION, BILATERAL  08/2018   Dr. Manuella Ghazi with [facility]    Current Medications: Current Meds  Medication Sig   ALPRAZolam (XANAX) 0.25 MG tablet Take 1 tablet up to twice daily as needed for anxiety episodes.  Use sparingly and not to take it every day. (Patient taking differently: Take 1 tablet up to twice daily as needed for anxiety episodes.  Use sparingly and not to take it every  day.)   amLODipine (NORVASC) 5 MG tablet Take 1 tablet (5 mg total) by mouth daily.   Ascorbic Acid (VITAMIN C) 100 MG tablet Take 500 mg by mouth daily. Gummy '750mg'$    aspirin EC 81 MG EC tablet Take 1 tablet (81 mg total) by mouth daily. Swallow whole.   atorvastatin (LIPITOR) 80 MG tablet Take 1 tablet (80 mg total) by mouth daily.   benzonatate (TESSALON) 200 MG capsule Take 1 capsule (200 mg total) by mouth 3 (three) times daily as needed for cough.   chlorthalidone (HYGROTON) 25 MG tablet Take 0.5 tablets (12.5 mg total) by mouth daily.   lisinopril (ZESTRIL) 40 MG tablet Take 1 tablet (40 mg total) by mouth daily. Please schedule appointment for future refills. Thank you   metoprolol succinate (TOPROL-XL) 100 MG 24 hr tablet Take 1 tablet (100 mg total) by mouth daily. Take with or immediately following a meal.   Multiple Vitamin (MULTIVITAMIN) capsule Take 1 capsule by mouth daily.   Omega-3 Fatty Acids (FISH OIL) 1000 MG CAPS Take by mouth. Krill oil   vitamin B-12 (CYANOCOBALAMIN) 100 MCG tablet Take 1,000 mcg by mouth daily.    VITAMIN D, ERGOCALCIFEROL, PO Take 1,000 Units by mouth daily.     Allergies:   Codeine, Levofloxacin, Myrbetriq [mirabegron], Prednisone, and Sulfa antibiotics   Social History  Socioeconomic History   Marital status: Significant Other    Spouse name: Not on file   Number of children: 3   Years of education: Not on file   Highest education level: Not on file  Occupational History   Not on file  Tobacco Use   Smoking status: Never   Smokeless tobacco: Never  Vaping Use   Vaping Use: Never used  Substance and Sexual Activity   Alcohol use: Yes    Alcohol/week: 1.0 standard drink    Types: 1 Cans of beer per week    Comment: rare   Drug use: No   Sexual activity: Not on file  Other Topics Concern   Not on file  Social History Narrative   Not on file   Social Determinants of Health   Financial Resource Strain: Not on file  Food  Insecurity: Not on file  Transportation Needs: Not on file  Physical Activity: Not on file  Stress: Not on file  Social Connections: Not on file     Family History: The patient's family history includes Breast cancer (age of onset: 36) in her maternal grandmother; Cancer in her brother; Heart disease in her maternal grandmother; Stroke in her mother.  ROS:   Please see the history of present illness.    Denies weight gain, loss of appetite.  All other systems reviewed and are negative.  EKGs/Labs/Other Studies Reviewed:    The following studies were reviewed today:  2D Doppler echocardiogram 2021: IMPRESSIONS     1. Left ventricular ejection fraction, by estimation, is 60 to 65%. The  left ventricle has normal function. The left ventricle has no regional  wall motion abnormalities. There is mild left ventricular hypertrophy.  Left ventricular diastolic parameters  are consistent with Grade I diastolic dysfunction (impaired relaxation).   2. Right ventricular systolic function is normal. The right ventricular  size is normal.   3. Left atrial size was moderately dilated.   4. The mitral valve is grossly normal. Trivial mitral valve  regurgitation.   5. The aortic valve was not well visualized. Aortic valve regurgitation  is not visualized. Moderate aortic valve stenosis. Aortic valve area, by  VTI measures 1.20 cm. Aortic valve mean gradient measures 14.0 mmHg.  Aortic valve Vmax measures 2.48 m/s.  DI is 0.35.   6. Aortic dilatation noted. There is mild dilatation of the ascending  aorta, measuring 38 mm.   7. The inferior vena cava is normal in size with <50% respiratory  variability, suggesting right atrial pressure of 8 mmHg.   Comparison(s): Prior images reviewed side by side. Changes from prior  study are noted. 02/04/2019: LVEF 60-65%, mild AS - mean gradient 13 mmHg,  aorta measures 38 mm.   EKG:  EKG normal sinus rhythm, poor R wave progression V1 through V4, and  when compared with prior tracing Performed in December, no significant changes noted.  Recent Labs: 06/28/2020: ALT 28 07/18/2020: Hemoglobin 14.2; Platelets 262 09/15/2020: BUN 27; Creatinine, Ser 0.75; Potassium 4.3; Sodium 141  Recent Lipid Panel    Component Value Date/Time   CHOL 160 06/29/2020 0500   CHOL 179 02/13/2019 1626   TRIG 133 06/29/2020 0500   HDL 40 (L) 06/29/2020 0500   HDL 42 02/13/2019 1626   CHOLHDL 4.0 06/29/2020 0500   VLDL 27 06/29/2020 0500   LDLCALC 93 06/29/2020 0500   LDLCALC 74 02/13/2019 1626   LDLCALC 82 01/18/2018 1416    Physical Exam:    VS:  BP  112/80   Pulse 87   Ht 5' 2.5" (1.588 m)   Wt 195 lb 9.6 oz (88.7 kg)   LMP  (LMP Unknown)   BMI 35.21 kg/m     Wt Readings from Last 3 Encounters:  04/21/21 195 lb 9.6 oz (88.7 kg)  03/09/21 186 lb (84.4 kg)  12/04/20 182 lb (82.6 kg)     GEN: Overweight. No acute distress HEENT: Normal NECK: No JVD. LYMPHATICS: No lymphadenopathy CARDIAC: 2/6 to 3/6 crescendo decrescendo systolic aortic stenosis murmur. RRR no gallop, or edema. VASCULAR:  Normal Pulses. No bruits. RESPIRATORY:  Clear to auscultation without rales, wheezing or rhonchi  ABDOMEN: Soft, non-tender, non-distended, No pulsatile mass, MUSCULOSKELETAL: No deformity  SKIN: Warm and dry NEUROLOGIC:  Alert and oriented x 3 PSYCHIATRIC:  Normal affect   ASSESSMENT:    1. Coronary artery disease involving native coronary artery of native heart with angina pectoris ([place])   2. Aortic valve stenosis, etiology of cardiac valve disease unspecified   3. Hypertension goal BP (blood pressure) < 140/90   4. Hyperlipidemia LDL goal <70   5. Nonrheumatic aortic valve stenosis   6. Aortic aneurysm without rupture, unspecified portion of aorta (HCC)    PLAN:    In order of problems listed above:  Reports symptoms.  Moderate physical activity.  Continue secondary preventive measures including high intensity statin therapy. 2D Doppler  echocardiogram prior to the next office visit in 1 year.  He has shortness of breath, swelling, syncope, near syncope, repeat echo more urgently.  We discussed the cardinal symptoms of severe aortic stenosis. Excellent blood pressure. Continue Lipitor 80 mg daily Did not discuss today.  Will reassess at time of echo.  Overall education and awareness concerning secondary risk prevention was discussed in detail: LDL less than 70, hemoglobin A1c less than 7, blood pressure target less than 130/80 mmHg, >150 minutes of moderate aerobic activity per week, avoidance of smoking, weight control (via diet and exercise), and continued surveillance/management of/for obstructive sleep apnea.    Medication Adjustments/Labs and Tests Ordered: Current medicines are reviewed at length with the patient today.  Concerns regarding medicines are outlined above.  Orders Placed This Encounter  Procedures   EKG 12-Lead   No orders of the defined types were placed in this encounter.   There are no Patient Instructions on file for this visit.   Signed, Sinclair Grooms, MD  04/21/2021 3:40 PM    [facility]

## 2021-04-21 NOTE — Patient Instructions (Signed)
Medication Instructions:  Your physician recommends that you continue on your current medications as directed. Please refer to the Current Medication list given to you today.  *If you need a refill on your cardiac medications before your next appointment, please call your pharmacy*   Lab Work: None If you have labs (blood work) drawn today and your tests are completely normal, you will receive your results only by: [place] (if you have MyChart) OR A paper copy in the mail If you have any lab test that is abnormal or we need to change your treatment, we will call you to review the results.   Testing/Procedures: Your physician has requested that you have an echocardiogram 1-2 weeks prior to seeing Dr. Tamala Julian back. Echocardiography is a painless test that uses sound waves to create images of your heart. It provides your doctor with information about the size and shape of your heart and how well your heart's chambers and valves are working. This procedure takes approximately one hour. There are no restrictions for this procedure.   Follow-Up: At [facility], you and your health needs are our priority.  As part of our continuing mission to provide you with exceptional heart care, we have created designated Provider Care Teams.  These Care Teams include your primary Cardiologist (physician) and Advanced Practice Providers (APPs -  Physician Assistants and Nurse Practitioners) who all work together to provide you with the care you need, when you need it.  We recommend signing up for the patient portal called "MyChart".  Sign up information is provided on this After Visit Summary.  MyChart is used to connect with patients for Virtual Visits (Telemedicine).  Patients are able to view lab/test results, encounter notes, upcoming appointments, etc.  Non-urgent messages can be sent to your provider as well.   To learn more about what you can do with MyChart, go to [email or web address].     Your next appointment:   1 year(s)  The format for your next appointment:   In Person  Provider:   You may see Sinclair Grooms, MD or one of the following Advanced Practice Providers on your designated Care Team:   Cecilie Kicks, NP   Other Instructions

## 2021-04-28 DIAGNOSIS — M9901 Segmental and somatic dysfunction of cervical region: Secondary | ICD-10-CM | POA: Diagnosis not present

## 2021-04-28 DIAGNOSIS — M62830 Muscle spasm of back: Secondary | ICD-10-CM | POA: Diagnosis not present

## 2021-04-28 DIAGNOSIS — M9903 Segmental and somatic dysfunction of lumbar region: Secondary | ICD-10-CM | POA: Diagnosis not present

## 2021-04-28 DIAGNOSIS — M5136 Other intervertebral disc degeneration, lumbar region: Secondary | ICD-10-CM | POA: Diagnosis not present

## 2021-05-17 DIAGNOSIS — M9905 Segmental and somatic dysfunction of pelvic region: Secondary | ICD-10-CM | POA: Diagnosis not present

## 2021-05-17 DIAGNOSIS — M9903 Segmental and somatic dysfunction of lumbar region: Secondary | ICD-10-CM | POA: Diagnosis not present

## 2021-05-17 DIAGNOSIS — M955 Acquired deformity of pelvis: Secondary | ICD-10-CM | POA: Diagnosis not present

## 2021-05-17 DIAGNOSIS — M9901 Segmental and somatic dysfunction of cervical region: Secondary | ICD-10-CM | POA: Diagnosis not present

## 2021-05-24 DIAGNOSIS — M9901 Segmental and somatic dysfunction of cervical region: Secondary | ICD-10-CM | POA: Diagnosis not present

## 2021-05-24 DIAGNOSIS — M955 Acquired deformity of pelvis: Secondary | ICD-10-CM | POA: Diagnosis not present

## 2021-05-24 DIAGNOSIS — M9903 Segmental and somatic dysfunction of lumbar region: Secondary | ICD-10-CM | POA: Diagnosis not present

## 2021-05-24 DIAGNOSIS — M9905 Segmental and somatic dysfunction of pelvic region: Secondary | ICD-10-CM | POA: Diagnosis not present

## 2021-06-01 DIAGNOSIS — M9903 Segmental and somatic dysfunction of lumbar region: Secondary | ICD-10-CM | POA: Diagnosis not present

## 2021-06-01 DIAGNOSIS — M9905 Segmental and somatic dysfunction of pelvic region: Secondary | ICD-10-CM | POA: Diagnosis not present

## 2021-06-01 DIAGNOSIS — M955 Acquired deformity of pelvis: Secondary | ICD-10-CM | POA: Diagnosis not present

## 2021-06-01 DIAGNOSIS — M9901 Segmental and somatic dysfunction of cervical region: Secondary | ICD-10-CM | POA: Diagnosis not present

## 2021-06-02 DIAGNOSIS — Z23 Encounter for immunization: Secondary | ICD-10-CM | POA: Diagnosis not present

## 2021-06-04 DIAGNOSIS — L570 Actinic keratosis: Secondary | ICD-10-CM | POA: Diagnosis not present

## 2021-06-04 DIAGNOSIS — X32XXXA Exposure to sunlight, initial encounter: Secondary | ICD-10-CM | POA: Diagnosis not present

## 2021-06-04 DIAGNOSIS — D2272 Melanocytic nevi of left lower limb, including hip: Secondary | ICD-10-CM | POA: Diagnosis not present

## 2021-06-04 DIAGNOSIS — D2261 Melanocytic nevi of right upper limb, including shoulder: Secondary | ICD-10-CM | POA: Diagnosis not present

## 2021-06-04 DIAGNOSIS — D2262 Melanocytic nevi of left upper limb, including shoulder: Secondary | ICD-10-CM | POA: Diagnosis not present

## 2021-06-04 DIAGNOSIS — Z85828 Personal history of other malignant neoplasm of skin: Secondary | ICD-10-CM | POA: Diagnosis not present

## 2021-06-09 DIAGNOSIS — M25571 Pain in right ankle and joints of right foot: Secondary | ICD-10-CM | POA: Diagnosis not present

## 2021-06-10 DIAGNOSIS — M25571 Pain in right ankle and joints of right foot: Secondary | ICD-10-CM | POA: Diagnosis not present

## 2021-06-15 DIAGNOSIS — M9901 Segmental and somatic dysfunction of cervical region: Secondary | ICD-10-CM | POA: Diagnosis not present

## 2021-06-15 DIAGNOSIS — M955 Acquired deformity of pelvis: Secondary | ICD-10-CM | POA: Diagnosis not present

## 2021-06-15 DIAGNOSIS — M9905 Segmental and somatic dysfunction of pelvic region: Secondary | ICD-10-CM | POA: Diagnosis not present

## 2021-06-15 DIAGNOSIS — M9903 Segmental and somatic dysfunction of lumbar region: Secondary | ICD-10-CM | POA: Diagnosis not present

## 2021-06-19 ENCOUNTER — Other Ambulatory Visit: Payer: Self-pay | Admitting: Interventional Cardiology

## 2021-06-23 DIAGNOSIS — Z23 Encounter for immunization: Secondary | ICD-10-CM | POA: Diagnosis not present

## 2021-06-28 DIAGNOSIS — M9901 Segmental and somatic dysfunction of cervical region: Secondary | ICD-10-CM | POA: Diagnosis not present

## 2021-06-28 DIAGNOSIS — M9903 Segmental and somatic dysfunction of lumbar region: Secondary | ICD-10-CM | POA: Diagnosis not present

## 2021-06-28 DIAGNOSIS — M9905 Segmental and somatic dysfunction of pelvic region: Secondary | ICD-10-CM | POA: Diagnosis not present

## 2021-06-28 DIAGNOSIS — M955 Acquired deformity of pelvis: Secondary | ICD-10-CM | POA: Diagnosis not present

## 2021-07-19 DIAGNOSIS — M9901 Segmental and somatic dysfunction of cervical region: Secondary | ICD-10-CM | POA: Diagnosis not present

## 2021-07-19 DIAGNOSIS — M9905 Segmental and somatic dysfunction of pelvic region: Secondary | ICD-10-CM | POA: Diagnosis not present

## 2021-07-19 DIAGNOSIS — M9903 Segmental and somatic dysfunction of lumbar region: Secondary | ICD-10-CM | POA: Diagnosis not present

## 2021-07-19 DIAGNOSIS — M955 Acquired deformity of pelvis: Secondary | ICD-10-CM | POA: Diagnosis not present

## 2021-07-20 ENCOUNTER — Telehealth: Payer: Self-pay | Admitting: Interventional Cardiology

## 2021-07-20 NOTE — Telephone Encounter (Signed)
Patient returned you call. She can be reached at [phone] or cell [phone]

## 2021-07-20 NOTE — Telephone Encounter (Signed)
Pt c/o of Chest Pain: STAT if CP now or developed within 24 hours  1. Are you having CP right now? Had chest pains last night- last longer than normal  2. Are you experiencing any other symptoms (ex. SOB, nausea, vomiting, sweating)? no  3. How long have you been experiencing CP? Started last night for about 3 or 4 minutes  4. Is your CP continuous or coming and going? Comes and goes   5. Have you taken Nitroglycerin? No- Patient said Dr Tamala Julian had said something about her seeing one of the doctors in our [place] office if she had problem, because it is closer to her . She wants to see a doctor in [place] and want you to help with that ?

## 2021-07-20 NOTE — Telephone Encounter (Signed)
Spoke with pt and she states she had an episode of CP last night that lasted longer than usual and was a little more intense.  She did her deep breathing exercises and it eventually passed.  Did not have vitals.   Has not had any further episodes since then.  Denies any current issues.  Pt has spoken with Dr. Tamala Julian previously about switching to the [facility] office to lessen her travels.  Pt would like to switch and be seen soon due to concerns with CP.  Scheduled her to see DOD in [place], Dr. Saunders Revel, on Thursday for evaluation.  Discussed when appropriate to proceed to ED.  Pt verbalized understanding and was in agreement with plan.

## 2021-07-20 NOTE — Telephone Encounter (Signed)
Attempted to contact pt.  VM picked up but was full.  Unable to leave message.

## 2021-07-22 ENCOUNTER — Ambulatory Visit (INDEPENDENT_AMBULATORY_CARE_PROVIDER_SITE_OTHER): Payer: Medicare Other | Admitting: Internal Medicine

## 2021-07-22 ENCOUNTER — Encounter: Payer: Self-pay | Admitting: Internal Medicine

## 2021-07-22 ENCOUNTER — Other Ambulatory Visit: Payer: Self-pay

## 2021-07-22 VITALS — BP 136/80 | HR 95 | Ht 63.0 in | Wt 195.0 lb

## 2021-07-22 DIAGNOSIS — I25119 Atherosclerotic heart disease of native coronary artery with unspecified angina pectoris: Secondary | ICD-10-CM

## 2021-07-22 DIAGNOSIS — E785 Hyperlipidemia, unspecified: Secondary | ICD-10-CM

## 2021-07-22 DIAGNOSIS — I350 Nonrheumatic aortic (valve) stenosis: Secondary | ICD-10-CM

## 2021-07-22 DIAGNOSIS — I10 Essential (primary) hypertension: Secondary | ICD-10-CM | POA: Diagnosis not present

## 2021-07-22 NOTE — Patient Instructions (Signed)
Medication Instructions:   Your physician recommends that you continue on your current medications as directed. Please refer to the Current Medication list given to you today.  *If you need a refill on your cardiac medications before your next appointment, please call your pharmacy*   Lab Work:  None ordered  Testing/Procedures:  None ordered   Follow-Up: At [facility], you and your health needs are our priority.  As part of our continuing mission to provide you with exceptional heart care, we have created designated Provider Care Teams.  These Care Teams include your primary Cardiologist (physician) and Advanced Practice Providers (APPs -  Physician Assistants and Nurse Practitioners) who all work together to provide you with the care you need, when you need it.  We recommend signing up for the patient portal called "MyChart".  Sign up information is provided on this After Visit Summary.  MyChart is used to connect with patients for Virtual Visits (Telemedicine).  Patients are able to view lab/test results, encounter notes, upcoming appointments, etc.  Non-urgent messages can be sent to your provider as well.   To learn more about what you can do with MyChart, go to [email or web address].    Your next appointment:   3 month(s)  The format for your next appointment:   In Person  Provider:   You may see Dr. Harrell Gave End or one of the following Advanced Practice Providers on your designated Care Team:   Murray Hodgkins, NP Christell Faith, PA-C Cadence Kathlen Mody, [place]

## 2021-07-22 NOTE — Progress Notes (Signed)
Follow-up Outpatient Visit Date: 07/22/2021  Primary Care Provider: Girtha Rm, PA-C No address on file  Chief Complaint: Chest pain  HPI:  Angel [name] is a 87 y.o. 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  She was previously followed in our [place] office by Dr. Tamala Julian, having last been seen in September.  She has asked to transition her care to the [place] office.  At the time of her last visit with Dr. Tamala Julian, she was doing okay other than tendinitis in the right foot that was limiting her mobility.  Most recent echo in 11/21 showed preserved LVEF with mild-moderate aortic stenosis.  Angel [name] presents today for evaluation of a recent episode of chest pain.  A few nights ago, she developed a soreness in the center of her chest, described as if she had been hit with a baseball.  She was getting ready for bed at the time and not doing anything strenuous.  The episode lasted 3-4 minutes and resolved spontaneously.  It is similar to prior episodes of chest discomfort that she has had for many years, though this 1 lasted a little bit longer than usual.  She did not take nitroglycerin.  She denies any shortness of breath, palpitations, lightheadedness, or edema.  She continues to have some difficulties with tendinitis in her right foot.  --------------------------------------------------------------------------------------------------  Past Medical History:  Diagnosis Date   Anxiety    Aortic aneurysm without rupture (HCC)    CAD (coronary artery disease)    Chronic hip pain    Endometriosis    Hearing loss    HTN, goal below 150/90    Hyperlipidemia LDL goal <70 11/18/2014   Osteopenia  of the elderly    Urge and stress incontinence    Past Surgical History:  Procedure Laterality Date   ABDOMINAL HYSTERECTOMY  1976   total   APPENDECTOMY     BREAST EXCISIONAL BIOPSY Left 1987   benign   CATARACT EXTRACTION, BILATERAL  08/2018   Dr. Manuella Ghazi with [facility]    Current Meds  Medication Sig   amLODipine (NORVASC) 5 MG tablet Take 1 tablet (5 mg total) by mouth daily.   Ascorbic Acid (VITAMIN C) 100 MG tablet Take 500 mg by mouth daily. Gummy 750mg    aspirin EC 81 MG EC tablet Take 1 tablet (81 mg total) by mouth daily. Swallow whole.   atorvastatin (LIPITOR) 80 MG tablet Take 1 tablet (80 mg total) by mouth daily.   benzonatate (TESSALON) 200 MG capsule Take 1 capsule (200 mg total) by mouth 3 (three) times daily as needed for cough.   chlorthalidone (HYGROTON) 25 MG tablet Take 0.5 tablets (12.5 mg total) by mouth daily.   lisinopril (ZESTRIL) 40 MG tablet TAKE 1 TABLET BY MOUTH EVERY DAY PLS SCHEDULE APPT FOR FUTURE REFILLS   metoprolol succinate (TOPROL-XL) 100 MG 24 hr tablet Take 1 tablet (100 mg total) by mouth daily. Take with or immediately following a meal.   Multiple Vitamin (MULTIVITAMIN) capsule Take 1 capsule by mouth daily.   nitroGLYCERIN (NITROSTAT) 0.4 MG SL tablet Place 1 tablet (0.4 mg total) under the tongue every 5 (five) minutes as needed for chest pain.   Omega-3 Fatty Acids (FISH OIL) 1000  MG CAPS Take by mouth daily. Krill oil   vitamin B-12 (CYANOCOBALAMIN) 100 MCG tablet Take 1,000 mcg by mouth daily.    VITAMIN D, ERGOCALCIFEROL, PO Take 1,000 Units by mouth daily.    Allergies: Codeine, Levofloxacin, Myrbetriq [mirabegron], Prednisone, and Sulfa antibiotics  Social History   Tobacco Use   Smoking status: Never   Smokeless tobacco: Never  Vaping Use   Vaping Use: Never used  Substance Use Topics   Alcohol use: Yes    Alcohol/week: 1.0 standard drink    Types: 1 Cans of beer per week    Comment: rare   Drug use: No     Family History  Problem Relation Age of Onset   Stroke Mother    Breast cancer Maternal Grandmother 75   Heart disease Maternal Grandmother    Cancer Brother        skin    Review of Systems: A 12-system review of systems was performed and was negative except as noted in the HPI.  --------------------------------------------------------------------------------------------------  Physical Exam: BP 136/80 (BP Location: Right Arm, Patient Position: Sitting, Cuff Size: Large)    Pulse 95    Ht 5\' 3"  (1.6 m)    Wt 195 lb (88.5 kg)    LMP  (LMP Unknown)    SpO2 95%    BMI 34.54 kg/m   General:  NAD. Neck: No JVD or HJR. Lungs: Clear to auscultation bilaterally without wheezes or crackles. Heart: Regular rate and rhythm with 3/6 systolic murmur. Abdomen: Soft, nontender, nondistended. Extremities: Trace pretibial edema bilaterally.  EKG: Normal sinus rhythm with left atrial enlargement, LVH, and poor R wave progression.  No significant change from prior tracing on 04/21/2021.  Lab Results  Component Value Date   WBC 7.7 07/18/2020   HGB 14.2 07/18/2020   HCT 43.4 07/18/2020   MCV 98.4 07/18/2020   PLT 262 07/18/2020    Lab Results  Component Value Date   NA 141 09/15/2020   K 4.3 09/15/2020   CL 101 09/15/2020   CO2 24 09/15/2020   BUN 27 09/15/2020   CREATININE 0.75 09/15/2020   GLUCOSE 133 (H) 09/15/2020   ALT 28 06/28/2020    Lab Results  Component Value Date   CHOL 160 06/29/2020   HDL 40 (L) 06/29/2020   LDLCALC 93 06/29/2020   TRIG 133 06/29/2020   CHOLHDL 4.0 06/29/2020    --------------------------------------------------------------------------------------------------  ASSESSMENT AND PLAN: Coronary artery disease with atypical chest pain: Recent episode of chest pain is similar to what Angel [name] has experienced for years and was self-limited without associated symptoms.  Prior myocardial perfusion stress test was abnormal with subsequent MRI showing  diverticulum versus small area of scarring near the apex.  Physical exam and EKG today are stable.  We discussed further evaluation with coronary CTA versus catheterization, but Angel [name] wishes to defer further testing at this time.  Petra [name] of her chest pain is also not consistent with worsening of her aortic stenosis, which was mild-moderate on last echocardiogram a year ago.  We discussed escalation of antianginal therapy, specifically metoprolol, but Angel [name] wished to defer this as well.  Aortic stenosis: Mild-moderate by echo in 06/2020.  3/6 systolic murmur noted on exam today with audible S2, suggesting that this is not critical aortic stenosis.  We discussed repeating an echocardiogram but have agreed to defer this for now at Angel [name]'s request.  Hypertension: Blood pressure upper normal.  Continue current medications.  Hyperlipidemia: LDL above goal on last  check in November, 2021 at 93.  Continue statin therapy with ongoing lifestyle modifications and follow-up through her PCP.  Follow-up: Return to clinic in 3 months.  Nelva Bush, MD 07/22/2021 1:59 PM

## 2021-07-24 ENCOUNTER — Encounter: Payer: Self-pay | Admitting: Internal Medicine

## 2021-08-10 ENCOUNTER — Ambulatory Visit (INDEPENDENT_AMBULATORY_CARE_PROVIDER_SITE_OTHER): Payer: Medicare Other | Admitting: Medical

## 2021-08-10 ENCOUNTER — Telehealth: Payer: Self-pay | Admitting: Family Medicine

## 2021-08-10 VITALS — BP 138/82 | HR 88 | Temp 98.4°F

## 2021-08-10 DIAGNOSIS — Z974 Presence of external hearing-aid: Secondary | ICD-10-CM | POA: Diagnosis not present

## 2021-08-10 DIAGNOSIS — R0989 Other specified symptoms and signs involving the circulatory and respiratory systems: Secondary | ICD-10-CM

## 2021-08-10 DIAGNOSIS — M271 Giant cell granuloma, central: Secondary | ICD-10-CM

## 2021-08-10 DIAGNOSIS — R0981 Nasal congestion: Secondary | ICD-10-CM | POA: Diagnosis not present

## 2021-08-10 MED ORDER — AMOXICILLIN 875 MG PO TABS: 875.0000 mg | ORAL_TABLET | Freq: Two times a day (BID) | ORAL | 0 refills | Status: AC

## 2021-08-10 NOTE — Patient Instructions (Addendum)
Recommendations  Blood tinged phlegm Begin amoxicillin antibiotic twice daily for 10 days given the blood-tinged mucus, drainage and sinus congestion.  I suspect sinus infection is causing the blood-tinged phlegm  Mouth lesion The lesion on the roof of the mouth will likely resolve in the next week or so.  If this is not going away within the next 2 to 3 weeks or if you get new areas of bleeding or similar lesions then recheck and we will check a blood count and other evaluation.  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  There are other possible diagnoses though.  If not resolving or if new lesions, then we would see you back, check labs and possible have you see oral surgery for biopsy  Amino acid supplement you inquired about You may begin the lysine amino acid if you wish.  I would recommend rechecking your electrolyte panel and calcium in 1 month after starting this just to make sure your calcium is okay.

## 2021-08-10 NOTE — Telephone Encounter (Signed)
Pt had left message on my phone during my time out of office.  I called her this morning.  She has blood in her throat, she is a [name] patient and will come in today to see Audelia Acton.  She states she did talk with Dr Redmond School.

## 2021-08-10 NOTE — Progress Notes (Signed)
Subjective:  Angel [name] is a 88 y.o. female who presents for Chief Complaint  Patient presents with   blood in mouth    States she has noticed blood in mouth for 1 week thought was a blood blister, yesterday felt blood coming from throat. Throat does not hurt, she mentions she feels fatigued     She notes about a week or so feeling a blister on her soft palate.  Researched this online, and talked to dentist by phone.   It was thought it was a blood blister.  She notes dentist didn't feel like it was a dental problem, advised it would likely go away in a week.  She has been using salt water gargles and hydrogen peroxide rinses.  She has been avoiding spicy foods, chocolate, coffee.    She notes yesterday feeling fatigued.   Had a busy hectic holiday time, so didn't think too much about the fatigue.  However, last 2 days felt phlegm in throat.   Saw blood in phlegm yesterday trying to get up phlegm.   This morning again had blood tinged mucous at 4:30am.  This morning had bitemporal headache.  Band like pain around head.  Used some tylenol.   Doesn't necessarily have sore throat.    Has some sinus pressure.  Has some some mild ear pressure.  Also wears hearing aids.   No cough.  No chest congestion.  No recent headaches otherwise leading up to these symptoms.    No fever, no night sweats, no weight changes, maybe slight appetite change.  No nosebleeds.   Pharmacist recommended she use oral L-lysine for skin.  She has questions about this.    No other aggravating or relieving factors.    No other c/o.   Past Medical History:  Diagnosis Date   Anxiety    Aortic aneurysm without rupture (HCC)    CAD (coronary artery disease)    Chronic hip pain    Endometriosis    Hearing loss    HTN, goal below 150/90    Hyperlipidemia LDL goal <70 11/18/2014   Osteopenia of the elderly    Urge and stress incontinence    Current Outpatient Medications on File Prior to Visit  Medication Sig  Dispense Refill   amLODipine (NORVASC) 5 MG tablet Take 1 tablet (5 mg total) by mouth daily. 90 tablet 3   Ascorbic Acid (VITAMIN C) 100 MG tablet Take 500 mg by mouth daily. Gummy 750mg      aspirin EC 81 MG EC tablet Take 1 tablet (81 mg total) by mouth daily. Swallow whole. 30 tablet 11   atorvastatin (LIPITOR) 80 MG tablet Take 1 tablet (80 mg total) by mouth daily. 90 tablet 3   chlorthalidone (HYGROTON) 25 MG tablet Take 0.5 tablets (12.5 mg total) by mouth daily. 45 tablet 3   lisinopril (ZESTRIL) 40 MG tablet TAKE 1 TABLET BY MOUTH EVERY DAY PLS SCHEDULE APPT FOR FUTURE REFILLS 90 tablet 0   metoprolol succinate (TOPROL-XL) 100 MG 24 hr tablet Take 1 tablet (100 mg total) by mouth daily. Take with or immediately following a meal. 90 tablet 3   Multiple Vitamin (MULTIVITAMIN) capsule Take 1 capsule by mouth daily.     Omega-3 Fatty Acids (FISH OIL) 1000 MG CAPS Take by mouth daily. Krill oil     vitamin B-12 (CYANOCOBALAMIN) 100 MCG tablet Take 1,000 mcg by mouth daily.      VITAMIN D, ERGOCALCIFEROL, PO Take 1,000 Units by mouth daily.  benzonatate (TESSALON) 200 MG capsule Take 1 capsule (200 mg total) by mouth 3 (three) times daily as needed for cough. (Patient not taking: Reported on 08/10/2021) 30 capsule 0   nitroGLYCERIN (NITROSTAT) 0.4 MG SL tablet Place 1 tablet (0.4 mg total) under the tongue every 5 (five) minutes as needed for chest pain. 25 tablet 3   No current facility-administered medications on file prior to visit.     The following portions of the patient's history were reviewed and updated as appropriate: allergies, current medications, past family history, past medical history, past social history, past surgical history and problem list.  ROS Otherwise as in subjective above  Objective: BP 138/82 (BP Location: Right Arm, Patient Position: Sitting)    Pulse 88    Temp 98.4 F (36.9 C) (Tympanic)    LMP  (LMP Unknown)    SpO2 97%   Wt Readings from Last 3  Encounters:  07/22/21 195 lb (88.5 kg)  04/21/21 195 lb 9.6 oz (88.7 kg)  03/09/21 186 lb (84.4 kg)    General appearance: alert, no distress, well developed, well nourished HEENT: normocephalic, sclerae anicteric, conjunctiva pink and moist, TMs pearly, nares with some mucoid discharge and erythema mostly on the right, pharynx normal Oral cavity: MMM, central hard palate with 89mm diameter likely resolving reddish lesion slightly raised, no other lesions Neck: supple, no lymphadenopathy, no thyromegaly, no masses Heart: RRR, normal S1, S2, no murmurs Lungs: CTA bilaterally, no wheezes, rhonchi, or rales Pulses: 2+ radial pulses, 2+ pedal pulses, normal cap refill Ext: no edema   Assessment: Encounter Diagnoses  Name Primary?   Phlegm in throat Yes   Sinus congestion    Giant cell granuloma    Wears hearing aid      Plan: We discussed concerns, differential diagnoses, possible treatment options.    Discussed the following recommendations and printed summary for her.  Patient Instructions  Recommendations  Blood tinged phlegm Begin amoxicillin antibiotic twice daily for 10 days given the blood-tinged mucus, drainage and sinus congestion.  I suspect sinus infection is causing the blood-tinged phlegm  Mouth lesion The lesion on the roof of the mouth will likely resolve in the next week or so.  If this is not going away within the next 2 to 3 weeks or if you get new areas of bleeding or similar lesions then recheck and we will check a blood count and other evaluation.  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  There are other possible diagnoses though.  If not resolving or if new lesions, then we would see you back, check labs and possible have you see oral surgery for biopsy  Amino acid supplement you inquired about You may begin the lysine amino acid if you wish.  I would recommend rechecking your electrolyte panel and calcium in 1 month after starting this just to make sure  your calcium is okay.   Roniqua was seen today for blood in mouth.  Diagnoses and all orders for this visit:  Phlegm in throat  Sinus congestion  Giant cell granuloma  Wears hearing aid  Other orders -     amoxicillin (AMOXIL) 875 MG tablet; Take 1 tablet (875 mg total) by mouth 2 (two) times daily for 10 days.    Follow up: prn

## 2021-08-16 DIAGNOSIS — M9905 Segmental and somatic dysfunction of pelvic region: Secondary | ICD-10-CM | POA: Diagnosis not present

## 2021-08-16 DIAGNOSIS — M9903 Segmental and somatic dysfunction of lumbar region: Secondary | ICD-10-CM | POA: Diagnosis not present

## 2021-08-16 DIAGNOSIS — M9902 Segmental and somatic dysfunction of thoracic region: Secondary | ICD-10-CM | POA: Diagnosis not present

## 2021-08-16 DIAGNOSIS — M5136 Other intervertebral disc degeneration, lumbar region: Secondary | ICD-10-CM | POA: Diagnosis not present

## 2021-08-24 ENCOUNTER — Other Ambulatory Visit: Payer: Self-pay | Admitting: Interventional Cardiology

## 2021-08-24 ENCOUNTER — Other Ambulatory Visit: Payer: Self-pay | Admitting: Physician Assistant

## 2021-08-25 DIAGNOSIS — K1370 Unspecified lesions of oral mucosa: Secondary | ICD-10-CM | POA: Diagnosis not present

## 2021-08-25 DIAGNOSIS — R042 Hemoptysis: Secondary | ICD-10-CM | POA: Diagnosis not present

## 2021-08-31 ENCOUNTER — Telehealth: Payer: Self-pay | Admitting: Internal Medicine

## 2021-08-31 NOTE — Telephone Encounter (Signed)
Pt c/o of Chest Pain: STAT if CP now or developed within 24 hours  1. Are you having CP right now? No   2. Are you experiencing any other symptoms (ex. SOB, nausea, vomiting, sweating)? Bleeding mouth unknown cause found by multiple providers   3. How long have you been experiencing CP? Not new issue had same issue at last visit   4. Is your CP continuous or coming and going? Intermittent   5. Have you taken Nitroglycerin? No takes otc and has seen ent and dentist but no known cause found  ? Patient wants asap appt declined to go to ED as she will surely die first

## 2021-08-31 NOTE — Telephone Encounter (Signed)
I spoke with the patient by phone. She called to report mouth bleeding that she has experienced x 1 month. She saw her dentist about a month ago and he identified a blood blister in the roof of her mouth and advised this should resolve in "a few days."  The patient reports no resolution of this, so she called her PCP office and saw Chana Bode, PA on 08/10/21. She was placed on an antibiotic x 10 days with no improvement in bleeding.  She saw Dr. Tami Ribas with ENT ~ 1 week ago. Per the patient, Dr. Tami Ribas looked up her nose with a device, but could find no source for her bleeding and cauterized the mouth blister.   The patient describes her bleeding as: - mouth bleeding, but with no source - she will wake up in the morning with blood in her mouth that will spill over to her night gown - she denies bleeding when she blows her nose - she does notice this almost daily and will note weakness/ discomfort in her chest when this occurs - bleeding is mostly seen in the mornings - no bleeding yesterday (1st time in ~ 25 days), but bleeding was noticed in the mouth this morning   The patient confirms use of a nasal saline spray.  I inquired if she was still taking ASA 81 mg once daily & she advised Dr. Tami Ribas told her to stop this, so she has been off ~ 1 week. I inquired if she noticed any bites to her tongue/ jaw when waking up and she advised she did not notice this.  She does feel some anxiety over her symptoms. She was advised to also see an oral surgeon, but does not feel this is appropriate as the bleeding source is unidentified.   The patient called our office today and she had recently heard from her son that a friend of his had the same issues and this was identified as CHF and treated.  I advised the patient I had not heard off bleeding in the mouth being associated with CHF, but will forward her message to Dr. Saunders Revel to review.  I also inquired if the patient had a PCP follow up- she  advised that her original PCP had left the practice and she was not able to get in her with new PCP yet, which is why she saw the PA.  I advised the patient that the PA had mentioned in his note at her 08/10/21 office visit about possibly checking blood work if symptoms persisted.   I advised the patient that I do think it is work checking a CBC on her, but I will need to review with Dr. Saunders Revel prior to ordering this for her. She is advised that once Dr. Saunders Revel reviews her message he may still advise her to follow up with her PCP for this issue/ lab work.   She is aware that Dr. Saunders Revel is out of the office until tomorrow, but if symptoms are stable, I do not think she needs to seek emergency care.  The patient is aware we will call her back once Dr. Saunders Revel reviews with further recommendations. She voices understanding and is agreeable.

## 2021-09-01 ENCOUNTER — Encounter: Payer: Self-pay | Admitting: Emergency Medicine

## 2021-09-01 ENCOUNTER — Emergency Department
Admission: EM | Admit: 2021-09-01 | Discharge: 2021-09-01 | Disposition: A | Discharge status: Home / Self Care | Payer: Medicare Other | Attending: Emergency Medicine | Admitting: Emergency Medicine

## 2021-09-01 ENCOUNTER — Other Ambulatory Visit: Payer: Self-pay

## 2021-09-01 ENCOUNTER — Emergency Department: Payer: Medicare Other

## 2021-09-01 DIAGNOSIS — N3000 Acute cystitis without hematuria: Secondary | ICD-10-CM | POA: Insufficient documentation

## 2021-09-01 DIAGNOSIS — R0789 Other chest pain: Secondary | ICD-10-CM

## 2021-09-01 DIAGNOSIS — R079 Chest pain, unspecified: Secondary | ICD-10-CM | POA: Diagnosis not present

## 2021-09-01 DIAGNOSIS — K1370 Unspecified lesions of oral mucosa: Secondary | ICD-10-CM | POA: Diagnosis not present

## 2021-09-01 DIAGNOSIS — I517 Cardiomegaly: Secondary | ICD-10-CM | POA: Diagnosis not present

## 2021-09-01 DIAGNOSIS — K068 Other specified disorders of gingiva and edentulous alveolar ridge: Secondary | ICD-10-CM | POA: Diagnosis not present

## 2021-09-01 LAB — CBC
HCT: 40.3 % (ref 36.0–46.0)
Hemoglobin: 13.1 g/dL (ref 12.0–15.0)
MCH: 31.3 pg (ref 26.0–34.0)
MCHC: 32.5 g/dL (ref 30.0–36.0)
MCV: 96.2 fL (ref 80.0–100.0)
Platelets: 285 10*3/uL (ref 150–400)
RBC: 4.19 MIL/uL (ref 3.87–5.11)
RDW: 13.2 % (ref 11.5–15.5)
WBC: 8.8 10*3/uL (ref 4.0–10.5)
nRBC: 0 % (ref 0.0–0.2)

## 2021-09-01 LAB — URINALYSIS, COMPLETE (UACMP) WITH MICROSCOPIC
Bilirubin Urine: NEGATIVE
Glucose, UA: NEGATIVE mg/dL
Hgb urine dipstick: NEGATIVE
Ketones, ur: NEGATIVE mg/dL
Nitrite: NEGATIVE
Protein, ur: NEGATIVE mg/dL
Specific Gravity, Urine: 1.01 (ref 1.005–1.030)
WBC, UA: >50 WBC/hpf (ref 0–5)
pH: 6.5 (ref 5.0–8.0)

## 2021-09-01 LAB — COMPREHENSIVE METABOLIC PANEL
ALT: 27 U/L (ref 0–44)
AST: 29 U/L (ref 15–41)
Albumin: 4 g/dL (ref 3.5–5.0)
Alkaline Phosphatase: 50 U/L (ref 38–126)
Anion gap: 10 (ref 5–15)
BUN: 26 mg/dL — ABNORMAL HIGH (ref 8–23)
CO2: 27 mmol/L (ref 22–32)
Calcium: 9.5 mg/dL (ref 8.9–10.3)
Chloride: 105 mmol/L (ref 98–111)
Creatinine, Ser: 0.77 mg/dL (ref 0.44–1.00)
GFR, Estimated: >60 mL/min (ref >60)
Glucose, Bld: 116 mg/dL — ABNORMAL HIGH (ref 70–99)
Potassium: 3.5 mmol/L (ref 3.5–5.1)
Sodium: 142 mmol/L (ref 135–145)
Total Bilirubin: 0.7 mg/dL (ref 0.3–1.2)
Total Protein: 7.6 g/dL (ref 6.5–8.1)

## 2021-09-01 LAB — TROPONIN I (HIGH SENSITIVITY): Troponin I (High Sensitivity): 8 ng/L (ref <18)

## 2021-09-01 MED ORDER — CEPHALEXIN 500 MG PO CAPS: 500.0000 mg | ORAL_CAPSULE | Freq: Two times a day (BID) | ORAL | 0 refills | Status: DC

## 2021-09-01 NOTE — Telephone Encounter (Signed)
Called and spoke with pt's son. Made aware of Dr. Darnelle Bos recc below.  Pt's son informed me that he is taking his mother to the ER at [facility] now d/t continued problems with bleeding.  Will make Dr. Saunders Revel aware.

## 2021-09-01 NOTE — ED Notes (Signed)
Pt c/o intermittently spitting up blood.  Sts she feels something running down the back of her throat and makes herself spit.  Pt has been seen by dentist and ENT for same.  Additionally, Pt reports intermittent heart palpitations and intermittent pressure on chest which Pt feels is related to anxiety from bleeding.

## 2021-09-01 NOTE — ED Triage Notes (Signed)
Pt states she is spitting up blood for the past month, pt c/o soreness in her mouth and jaws, states she has been seen by her PCP and dentist,  pt has been taken off her daily baby aspirin.[name]

## 2021-09-01 NOTE — ED Provider Notes (Addendum)
[facility] Provider Note    Event Date/Time   First MD Initiated Contact with Patient 09/01/21 [phone]     (approximate)   History   oral bleeding   HPI  Angel [name] is a 88 y.o. female who presents with complaints of oral bleeding as well as chest discomfort.  Patient describes nearly a month of intermittent spitting up blood.  Patient describes feeling a sensation of blood in her throat, "snorting "and then spitting out essentially blood, no mucus.  No coughing.  No shortness of breath.  She reports occasionally this makes her anxious and she feels pressure in her chest, this most recently happened last night.     Physical Exam   Triage Vital Signs: ED Triage Vitals  Enc Vitals Group     BP 09/01/21 0908 (!) 166/92     Pulse Rate 09/01/21 0908 92     Resp 09/01/21 0908 18     Temp 09/01/21 0908 98.4 F (36.9 C)     Temp Source 09/01/21 0908 Oral     SpO2 09/01/21 0908 90 %     Weight 09/01/21 0908 88.5 kg (195 lb 1.7 oz)     Height 09/01/21 0908 1.6 m (5\' 3" )     Head Circumference --      Peak Flow --      Pain Score 09/01/21 0908 7     Pain Loc --      Pain Edu? --      Excl. in [place]? --     Most recent vital signs: Vitals:   09/01/21 0908 09/01/21 1151  BP: (!) 166/92 (!) 150/66  Pulse: 92 65  Resp: 18 16  Temp: 98.4 F (36.9 C)   SpO2: 90% 91%     General: Awake, no distress.  CV:  Good peripheral perfusion.  Regular rate and rhythm, no murmur Resp:  Normal effort.  Abd:  No distention.  Other:  Roof of mouth, small blood blister appearing lesion, not bleeding initially however after having the patient "snort "she did spit out blood, reevaluated the lesion and noted it to be bleeding mildly, I suspect this is the source of her bleeding    ED Results / Procedures / Treatments   Labs (all labs ordered are listed, but only abnormal results are displayed) Labs Reviewed  COMPREHENSIVE METABOLIC PANEL - Abnormal; Notable for  the following components:      Result Value   Glucose, Bld 116 (*)    BUN 26 (*)    All other components within normal limits  URINALYSIS, COMPLETE (UACMP) WITH MICROSCOPIC - Abnormal; Notable for the following components:   Leukocytes,Ua MODERATE (*)    Bacteria, UA FEW (*)    All other components within normal limits  URINE CULTURE  CBC  TROPONIN I (HIGH SENSITIVITY)     EKG  ED ECG REPORT I, Lavonia Drafts, the attending physician, personally viewed and interpreted this ECG.  Date: 09/01/2021  Rhythm: normal sinus rhythm QRS Axis: normal Intervals: normal ST/T Wave abnormalities: normal Narrative Interpretation: no evidence of acute ischemia    RADIOLOGY Chest x-ray viewed by me, no acute abnormality.    PROCEDURES:  Critical Care performed:   Procedures   MEDICATIONS ORDERED IN ED: Medications - No data to display   IMPRESSION / MDM / [place] / ED COURSE  I reviewed the triage vital signs and the nursing notes.  Patient presents with bleeding as detailed above, does appear  to be a lesion at the roof of the mouth which is causing her symptoms intermittently over the last month.  She will need referral to OMFS to potentially biopsy, determine treatment for this  Given the chest discomfort she had last night, will check labs, EKG, chest x-ray  Lab work reviewed by me, reassuring CMP CBC and troponin  EKG is unremarkable.  Not consistent with ACS, she feels it was stress related and I am inclined to agree, no indication for admission, return precautions discussed  Urinalysis consistent with early urinary tract infection, will prescribe Keflex  Referral to OMFS            FINAL CLINICAL IMPRESSION(S) / ED DIAGNOSES   Final diagnoses:  Lesion of mouth  Chest discomfort  Acute cystitis without hematuria     Rx / DC Orders   ED Discharge Orders          Ordered    cephALEXin (KEFLEX) 500 MG capsule  2 times daily         09/01/21 1112             Note:  This document was prepared using Dragon voice recognition software and may include unintentional dictation errors.   Lavonia Drafts, MD 09/01/21 1204    Lavonia Drafts, MD 09/01/21 [phone]

## 2021-09-01 NOTE — Telephone Encounter (Signed)
Mouth bleeding is unlikely to be driven by primary cardiac problem.  I agree with recommendation to hold aspirin for now.  I think would be reasonable to check a CBC with differential, CMP, PT/INR, and PTT to evaluate for potential causes of her unusual bleeding.  Angel Bush, MD [facility]

## 2021-09-03 DIAGNOSIS — Z0489 Encounter for examination and observation for other specified reasons: Secondary | ICD-10-CM | POA: Diagnosis not present

## 2021-09-04 LAB — URINE CULTURE: Culture: >=100000 — AB

## 2021-09-13 DIAGNOSIS — M9905 Segmental and somatic dysfunction of pelvic region: Secondary | ICD-10-CM | POA: Diagnosis not present

## 2021-09-13 DIAGNOSIS — M5136 Other intervertebral disc degeneration, lumbar region: Secondary | ICD-10-CM | POA: Diagnosis not present

## 2021-09-13 DIAGNOSIS — M9902 Segmental and somatic dysfunction of thoracic region: Secondary | ICD-10-CM | POA: Diagnosis not present

## 2021-09-13 DIAGNOSIS — M9903 Segmental and somatic dysfunction of lumbar region: Secondary | ICD-10-CM | POA: Diagnosis not present

## 2021-09-14 ENCOUNTER — Other Ambulatory Visit: Payer: Self-pay

## 2021-09-14 ENCOUNTER — Ambulatory Visit (INDEPENDENT_AMBULATORY_CARE_PROVIDER_SITE_OTHER): Payer: Medicare Other | Admitting: Physician Assistant

## 2021-09-14 ENCOUNTER — Encounter: Payer: Self-pay | Admitting: Physician Assistant

## 2021-09-14 VITALS — BP 128/86 | HR 78 | Ht 61.5 in | Wt 191.2 lb

## 2021-09-14 DIAGNOSIS — I10 Essential (primary) hypertension: Secondary | ICD-10-CM | POA: Diagnosis not present

## 2021-09-14 DIAGNOSIS — R0683 Snoring: Secondary | ICD-10-CM

## 2021-09-14 DIAGNOSIS — R0981 Nasal congestion: Secondary | ICD-10-CM

## 2021-09-14 DIAGNOSIS — F418 Other specified anxiety disorders: Secondary | ICD-10-CM | POA: Diagnosis not present

## 2021-09-14 MED ORDER — BUSPIRONE HCL 5 MG PO TABS: 5.0000 mg | ORAL_TABLET | Freq: Two times a day (BID) | ORAL | 2 refills | Status: DC

## 2021-09-14 NOTE — Progress Notes (Signed)
Acute Office Visit  Subjective:    Patient ID: Angel [name], female    DOB: [birth year], 88 y.o.   MRN: [id number]  Chief Complaint  Patient presents with   Mouth Lesions    Follow up ( picture in previous ER note) Has improved since ER on 09/01/21, states was a blood blister in mouth. States she was advised to see an Chief Financial Officer, She has been seeing Dr. Larkin Ina Drab who has been cauterizing area in mouth. Mentions she has had throat drainage with some blood- has ENT apt 10/21/21 with Dr.Juenygal.   Anxiety    States used to take Xanax in the past she does not want this, but she would like to try medication milder for Anxiety to take when needed    HPI Patient is in today for a follow up appointment with her Husband. She has a follow up appointment scheduled with ENT for a mouth lesion; states she can eat and swallow without any problems; has been taking OTC Xyzal as recommended by her Oral Surgeon Dr. Michael Litter for some post nasal drainage.   Also requests to discuss medicine for situational anxiety that happens when she has health issues like her mouth lesion and family stressors; has taken Xanax in the past and doesn't necessarily want to take that again; often uses meditation to help her relax.   Also reports her snoring has returned; states she had a sleep study done several years ago and was not determined to need a CPAP machine at night to breath. Requests to have her snoring checked again. Past Medical History:  Diagnosis Date   Acute CVA (cerebrovascular accident) ([place]) 06/28/2020   Anxiety    Aortic aneurysm without rupture (HCC)    CAD (coronary artery disease)    Chronic hip pain    Endometriosis    Hearing loss    HTN, goal below 150/90    Hyperlipidemia LDL goal <70 11/18/2014   Osteopenia of the elderly    Urge and stress incontinence     Past Surgical History:  Procedure Laterality Date   ABDOMINAL HYSTERECTOMY  1976   total   APPENDECTOMY     BREAST  EXCISIONAL BIOPSY Left 1987   benign   CATARACT EXTRACTION, BILATERAL  08/2018   Dr. Manuella Ghazi with [facility]    Family History  Problem Relation Age of Onset   Stroke Mother    Breast cancer Maternal Grandmother 71   Heart disease Maternal Grandmother    Cancer Brother        skin    Social History   Socioeconomic History   Marital status: Significant Other    Spouse name: Not on file   Number of children: 3   Years of education: Not on file   Highest education level: Not on file  Occupational History   Not on file  Tobacco Use   Smoking status: Never   Smokeless tobacco: Never  Vaping Use   Vaping Use: Never used  Substance and Sexual Activity   Alcohol use: Yes    Alcohol/week: 1.0 standard drink    Types: 1 Cans of beer per week    Comment: rare   Drug use: No   Sexual activity: Not on file  Other Topics Concern   Not on file  Social History Narrative   Not on file   Social Determinants of Health   Financial Resource Strain: Not on file  Food Insecurity: Not on file  Transportation Needs:  Not on file  Physical Activity: Not on file  Stress: Not on file  Social Connections: Not on file  Intimate Partner Violence: Not on file    Outpatient Medications Prior to Visit  Medication Sig Dispense Refill   amLODipine (NORVASC) 5 MG tablet Take 1 tablet (5 mg total) by mouth daily. 90 tablet 3   Ascorbic Acid (VITAMIN C) 100 MG tablet Take 500 mg by mouth daily. Gummy 750mg      atorvastatin (LIPITOR) 80 MG tablet Take 1 tablet (80 mg total) by mouth daily. 90 tablet 3   chlorthalidone (HYGROTON) 25 MG tablet TAKE 1/2 TABLET BY MOUTH EVERY DAY 45 tablet 3   lisinopril (ZESTRIL) 40 MG tablet TAKE 1 TABLET BY MOUTH EVERY DAY PLS SCHEDULE APPT FOR FUTURE REFILLS 90 tablet 0   metoprolol succinate (TOPROL-XL) 100 MG 24 hr tablet TAKE 1 TABLET BY MOUTH DAILY. TAKE WITH OR IMMEDIATELY FOLLOWING A MEAL. 90 tablet 3   Multiple Vitamin (MULTIVITAMIN) capsule Take 1  capsule by mouth daily.     Omega-3 Fatty Acids (FISH OIL) 1000 MG CAPS Take by mouth daily. Krill oil     vitamin B-12 (CYANOCOBALAMIN) 100 MCG tablet Take 1,000 mcg by mouth daily.      VITAMIN D, ERGOCALCIFEROL, PO Take 1,000 Units by mouth daily.     aspirin EC 81 MG EC tablet Take 1 tablet (81 mg total) by mouth daily. Swallow whole. (Patient not taking: Reported on 09/14/2021) 30 tablet 11   benzonatate (TESSALON) 200 MG capsule Take 1 capsule (200 mg total) by mouth 3 (three) times daily as needed for cough. (Patient not taking: Reported on 08/10/2021) 30 capsule 0   cephALEXin (KEFLEX) 500 MG capsule Take 1 capsule (500 mg total) by mouth 2 (two) times daily. (Patient not taking: Reported on 09/14/2021) 14 capsule 0   nitroGLYCERIN (NITROSTAT) 0.4 MG SL tablet Place 1 tablet (0.4 mg total) under the tongue every 5 (five) minutes as needed for chest pain. 25 tablet 3   No facility-administered medications prior to visit.    Allergies  Allergen Reactions   Codeine Other (See Comments)    "zoned out"   Levofloxacin Other (See Comments)    AMS   Myrbetriq [Mirabegron] Other (See Comments)    Severe depression   Prednisone Other (See Comments)    AMS   Sulfa Antibiotics Other (See Comments)    AMS    Review of Systems  Constitutional:  Negative for activity change and chills.  HENT:  Positive for mouth sores and postnasal drip. Negative for congestion and voice change.   Eyes:  Negative for pain and redness.  Respiratory:  Negative for cough and wheezing.   Cardiovascular:  Negative for chest pain.  Gastrointestinal:  Negative for constipation, diarrhea, nausea and vomiting.  Endocrine: Negative for polyuria.  Genitourinary:  Negative for frequency.  Skin:  Negative for color change and rash.  Allergic/Immunologic: Negative for immunocompromised state.  Neurological:  Negative for dizziness.  Psychiatric/Behavioral:  Negative for agitation.       Objective:    Physical  Exam Constitutional:      General: She is not in acute distress.    Appearance: Normal appearance. She is not ill-appearing.  HENT:     Head: Normocephalic and atraumatic.     Right Ear: External ear normal.     Left Ear: External ear normal.  Eyes:     Extraocular Movements: Extraocular movements intact.     Conjunctiva/sclera: Conjunctivae normal.  Pupils: Pupils are equal, round, and reactive to light.  Cardiovascular:     Rate and Rhythm: Normal rate and regular rhythm.     Pulses: Normal pulses.     Heart sounds: Normal heart sounds.  Pulmonary:     Effort: Pulmonary effort is normal.     Breath sounds: Normal breath sounds. No wheezing.  Abdominal:     General: Bowel sounds are normal.     Palpations: Abdomen is soft.  Musculoskeletal:     Cervical back: Normal range of motion and neck supple.     Lumbar back: Normal. Negative right straight leg raise test and negative left straight leg raise test.     Right lower leg: No edema.     Left lower leg: No edema.  Skin:    General: Skin is warm and dry.     Findings: No bruising.  Neurological:     General: No focal deficit present.     Mental Status: She is alert and oriented to person, place, and time.  Psychiatric:        Mood and Affect: Mood normal.        Behavior: Behavior normal.        Thought Content: Thought content normal.    BP 128/86 (BP Location: Right Arm, Patient Position: Sitting)    Pulse 78    Ht 5' 1.5" (1.562 m)    Wt 191 lb 3.2 oz (86.7 kg)    LMP  (LMP Unknown)    SpO2 94%    BMI 35.54 kg/m  Wt Readings from Last 3 Encounters:  09/14/21 191 lb 3.2 oz (86.7 kg)  09/01/21 195 lb 1.7 oz (88.5 kg)  07/22/21 195 lb (88.5 kg)    Health Maintenance Due  Topic Date Due   Pneumonia Vaccine 34+ Years old (1 - PCV) Never done   Zoster Vaccines- Shingrix (2 of 2) 03/22/2018   COVID-19 Vaccine (5 - Booster for Pfizer series) 03/15/2021    There are no preventive care reminders to display for this  patient.   Lab Results  Component Value Date   TSH 1.44 01/18/2018   Lab Results  Component Value Date   WBC 8.8 09/01/2021   HGB 13.1 09/01/2021   HCT 40.3 09/01/2021   MCV 96.2 09/01/2021   PLT 285 09/01/2021   Lab Results  Component Value Date   NA 142 09/01/2021   K 3.5 09/01/2021   CO2 27 09/01/2021   GLUCOSE 116 (H) 09/01/2021   BUN 26 (H) 09/01/2021   CREATININE 0.77 09/01/2021   BILITOT 0.7 09/01/2021   ALKPHOS 50 09/01/2021   AST 29 09/01/2021   ALT 27 09/01/2021   PROT 7.6 09/01/2021   ALBUMIN 4.0 09/01/2021   CALCIUM 9.5 09/01/2021   ANIONGAP 10 09/01/2021   GFR 94.67 11/06/2013   Lab Results  Component Value Date   CHOL 160 06/29/2020   Lab Results  Component Value Date   HDL 40 (L) 06/29/2020   Lab Results  Component Value Date   LDLCALC 93 06/29/2020   Lab Results  Component Value Date   TRIG 133 06/29/2020   Lab Results  Component Value Date   CHOLHDL 4.0 06/29/2020   Lab Results  Component Value Date   HGBA1C 6.2 (H) 06/29/2020       Assessment & Plan:   Problem List Items Addressed This Visit       Cardiovascular and Mediastinum   Hypertension goal BP (blood pressure) < 140/90 -  Primary (Chronic)     Other   Situational anxiety   Relevant Medications   busPIRone (BUSPAR) 5 MG tablet   Chronic nasal congestion   Snoring   Relevant Orders   Ambulatory referral to Pulmonology     Meds ordered this encounter  Medications   busPIRone (BUSPAR) 5 MG tablet    Sig: Take 1 tablet (5 mg total) by mouth 2 (two) times daily.    Dispense:  60 tablet    Refill:  2    Order Specific Question:   Supervising Provider    Answer:   Denita Lung [phone]   Keep already scheduled appointment with ENT. Referral to Pulmonology made to follow up snoring/evaluate further with a sleep study. Buspar 5 mg bid for anxiety; continue meditating. Follow up here in 2 months for an annual exam.  Irene Pap, PA-C

## 2021-09-14 NOTE — Patient Instructions (Signed)
You can take an over the counter antihistamine to help with allergic rhinitis / itching / hives: NON-DROWSY Allegra (Fexofenadine) 180 mg daily or NON-Drowsy Claritin (Loratidine) 10 mg daily  or DROWSY Benadryl (Diphenhydramine) 25 mg as directed or Zyrtec (Cetirizine) 10 mg daily. You can go to a store with a pharmacy and ask them to help you find these medicines.

## 2021-09-15 DIAGNOSIS — K1379 Other lesions of oral mucosa: Secondary | ICD-10-CM | POA: Diagnosis not present

## 2021-09-17 ENCOUNTER — Other Ambulatory Visit: Payer: Self-pay | Admitting: Interventional Cardiology

## 2021-09-17 NOTE — Telephone Encounter (Signed)
Refill request

## 2021-09-23 DIAGNOSIS — R042 Hemoptysis: Secondary | ICD-10-CM | POA: Diagnosis not present

## 2021-09-23 DIAGNOSIS — H6123 Impacted cerumen, bilateral: Secondary | ICD-10-CM | POA: Diagnosis not present

## 2021-10-01 DIAGNOSIS — M25571 Pain in right ankle and joints of right foot: Secondary | ICD-10-CM | POA: Diagnosis not present

## 2021-10-11 DIAGNOSIS — M9905 Segmental and somatic dysfunction of pelvic region: Secondary | ICD-10-CM | POA: Diagnosis not present

## 2021-10-11 DIAGNOSIS — M9903 Segmental and somatic dysfunction of lumbar region: Secondary | ICD-10-CM | POA: Diagnosis not present

## 2021-10-11 DIAGNOSIS — M5136 Other intervertebral disc degeneration, lumbar region: Secondary | ICD-10-CM | POA: Diagnosis not present

## 2021-10-11 DIAGNOSIS — M9902 Segmental and somatic dysfunction of thoracic region: Secondary | ICD-10-CM | POA: Diagnosis not present

## 2021-10-19 DIAGNOSIS — M25571 Pain in right ankle and joints of right foot: Secondary | ICD-10-CM | POA: Diagnosis not present

## 2021-11-02 ENCOUNTER — Telehealth: Payer: Self-pay | Admitting: Physician Assistant

## 2021-11-02 DIAGNOSIS — H2513 Age-related nuclear cataract, bilateral: Secondary | ICD-10-CM | POA: Diagnosis not present

## 2021-11-02 NOTE — Telephone Encounter (Signed)
Spoke with patient to schedule Medicare Annual Wellness Visit (AWV) either virtually or in office. ? ?Pt didn't want to schedule right now  wcb to schedule  ? ?Last AWV 10/13/20 ?please schedule at anytime with health coach ? ? ?

## 2021-11-08 DIAGNOSIS — M9902 Segmental and somatic dysfunction of thoracic region: Secondary | ICD-10-CM | POA: Diagnosis not present

## 2021-11-08 DIAGNOSIS — M5136 Other intervertebral disc degeneration, lumbar region: Secondary | ICD-10-CM | POA: Diagnosis not present

## 2021-11-08 DIAGNOSIS — M9905 Segmental and somatic dysfunction of pelvic region: Secondary | ICD-10-CM | POA: Diagnosis not present

## 2021-11-08 DIAGNOSIS — M9903 Segmental and somatic dysfunction of lumbar region: Secondary | ICD-10-CM | POA: Diagnosis not present

## 2021-11-10 ENCOUNTER — Other Ambulatory Visit
Admission: RE | Admit: 2021-11-10 | Discharge: 2021-11-10 | Disposition: A | Discharge status: Home / Self Care | Payer: Medicare Other | Attending: Internal Medicine | Admitting: Internal Medicine

## 2021-11-10 ENCOUNTER — Ambulatory Visit (INDEPENDENT_AMBULATORY_CARE_PROVIDER_SITE_OTHER): Payer: Medicare Other | Admitting: Internal Medicine

## 2021-11-10 ENCOUNTER — Encounter: Payer: Self-pay | Admitting: Internal Medicine

## 2021-11-10 VITALS — BP 130/74 | HR 99 | Ht 63.0 in | Wt 194.0 lb

## 2021-11-10 DIAGNOSIS — I25119 Atherosclerotic heart disease of native coronary artery with unspecified angina pectoris: Secondary | ICD-10-CM | POA: Diagnosis not present

## 2021-11-10 DIAGNOSIS — I10 Essential (primary) hypertension: Secondary | ICD-10-CM | POA: Insufficient documentation

## 2021-11-10 DIAGNOSIS — I350 Nonrheumatic aortic (valve) stenosis: Secondary | ICD-10-CM | POA: Insufficient documentation

## 2021-11-10 DIAGNOSIS — I25118 Atherosclerotic heart disease of native coronary artery with other forms of angina pectoris: Secondary | ICD-10-CM | POA: Diagnosis not present

## 2021-11-10 DIAGNOSIS — E785 Hyperlipidemia, unspecified: Secondary | ICD-10-CM | POA: Insufficient documentation

## 2021-11-10 LAB — LIPID PANEL
Cholesterol: 169 mg/dL (ref 0–200)
HDL: 47 mg/dL (ref >40)
LDL Cholesterol: 79 mg/dL (ref 0–99)
Total CHOL/HDL Ratio: 3.6 RATIO
Triglycerides: 215 mg/dL — ABNORMAL HIGH (ref <150)
VLDL: 43 mg/dL — ABNORMAL HIGH (ref 0–40)

## 2021-11-10 LAB — COMPREHENSIVE METABOLIC PANEL
ALT: 25 U/L (ref 0–44)
AST: 24 U/L (ref 15–41)
Albumin: 3.7 g/dL (ref 3.5–5.0)
Alkaline Phosphatase: 42 U/L (ref 38–126)
Anion gap: 10 (ref 5–15)
BUN: 35 mg/dL — ABNORMAL HIGH (ref 8–23)
CO2: 23 mmol/L (ref 22–32)
Calcium: 8.8 mg/dL — ABNORMAL LOW (ref 8.9–10.3)
Chloride: 106 mmol/L (ref 98–111)
Creatinine, Ser: 0.93 mg/dL (ref 0.44–1.00)
GFR, Estimated: 59 mL/min — ABNORMAL LOW (ref >60)
Glucose, Bld: 214 mg/dL — ABNORMAL HIGH (ref 70–99)
Potassium: 3.2 mmol/L — ABNORMAL LOW (ref 3.5–5.1)
Sodium: 139 mmol/L (ref 135–145)
Total Bilirubin: 0.4 mg/dL (ref 0.3–1.2)
Total Protein: 6.9 g/dL (ref 6.5–8.1)

## 2021-11-10 NOTE — Patient Instructions (Signed)
Medication Instructions:  ? ?Your physician recommends that you continue on your current medications as directed. Please refer to the Current Medication list given to you today. ? ?*If you need a refill on your cardiac medications before your next appointment, please call your pharmacy* ? ? ?Lab Work: ? ?Today at the medical mall at [facility]: CMET, Lipid panel ? ?If you have labs (blood work) drawn today and your tests are completely normal, you will receive your results only by: ?MyChart Message (if you have MyChart) OR ?A paper copy in the mail ?If you have any lab test that is abnormal or we need to change your treatment, we will call you to review the results. ? ? ?Testing/Procedures: ? ?Your physician has requested that you have an echocardiogram in 6 months. Echocardiography is a painless test that uses sound waves to create images of your heart. It provides your doctor with information about the size and shape of your heart and how well your heart?s chambers and valves are working. This procedure takes approximately one hour. There are no restrictions for this procedure. ? ? ?Follow-Up: ?At [facility], you and your health needs are our priority.  As part of our continuing mission to provide you with exceptional heart care, we have created designated Provider Care Teams.  These Care Teams include your primary Cardiologist (physician) and Advanced Practice Providers (APPs -  Physician Assistants and Nurse Practitioners) who all work together to provide you with the care you need, when you need it. ? ?We recommend signing up for the patient portal called "MyChart".  Sign up information is provided on this After Visit Summary.  MyChart is used to connect with patients for Virtual Visits (Telemedicine).  Patients are able to view lab/test results, encounter notes, upcoming appointments, etc.  Non-urgent messages can be sent to your provider as well.   ?To learn more about what you can do with MyChart, go to  [email or web address].   ? ?Your next appointment:   ? ?6 month(s) shortly after echo ? ?The format for your next appointment:   ?In Person ? ?Provider:   ?You may see Dr. Harrell Gave End or one of the following Advanced Practice Providers on your designated Care Team:   ?Murray Hodgkins, NP ?Christell Faith, PA-C ?Cadence Kathlen Mody, PA-C ?

## 2021-11-10 NOTE — Progress Notes (Signed)
? ?Follow-up Outpatient Visit ?Date: 11/10/2021 ? ?Primary Care Provider: ?Irene Pap, PA-C ?[address] ?[place] [address] ? ?Chief Complaint: Follow-up angina and aortic stenosis ? ?HPI:  Angel [name] is a 88 y.o. 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  I met her in 07/2021 after she wished to transfer her care from our [place] office to [place].  At that time, she reported a an episode of soreness in her chest that occurred at night.  It resolved spontaneously after 3-4 minutes.  We discussed ischemia evaluation with coronary CTA versus catheterization but agreed to defer further testing.  Angel [name] also did not wish to pursue any medication changes. ? ?Today, Angel [name] reports that she has been doing fairly well from a heart standpoint, reporting stable DOE when walking a lot or going up an incline.  She has not had any chest pain, palpitations, lightheadedness, or edema.  Her primary concern over the last few months has been a sore along the roof of her mouth that has bled.  She has been seen in the ED as well as by ENT and oral surgery with multiple cauterizations.  It is finally starting to heal. ? ?-------------------------------------------------------------------------------------------------- ? ?Past Medical History:  ?Diagnosis Date  ? Acute CVA (cerebrovascular accident) ([place]) 06/28/2020  ? Anxiety   ? Aortic aneurysm without rupture ([place])   ? CAD (coronary artery disease)   ? Chronic hip pain   ? Endometriosis   ? Hearing loss   ? HTN, goal below 150/90   ? Hyperlipidemia LDL goal <70 11/18/2014  ? Osteopenia of the elderly   ? Urge and stress incontinence   ? ?Past Surgical  History:  ?Procedure Laterality Date  ? ABDOMINAL HYSTERECTOMY  1976  ? total  ? APPENDECTOMY    ? BREAST EXCISIONAL BIOPSY Left 1987  ? benign  ? CATARACT EXTRACTION, BILATERAL  08/2018  ? Dr. Manuella Ghazi with [place]  ? ? ?Current Meds  ?Medication Sig  ? amLODipine (NORVASC) 5 MG tablet Take 1 tablet (5 mg total) by mouth daily.  ? Ascorbic Acid (VITAMIN C) 100 MG tablet Take 500 mg by mouth daily. Gummy 776m  ? aspirin EC 81 MG EC tablet Take 1 tablet (81 mg total) by mouth daily. Swallow whole.  ? atorvastatin (LIPITOR) 80 MG tablet Take 1 tablet (80 mg total) by mouth daily.  ? busPIRone (BUSPAR) 5 MG tablet Take 1 tablet (5 mg total) by mouth 2 (two) times daily.  ? chlorthalidone (HYGROTON) 25 MG tablet TAKE 1/2 TABLET BY MOUTH EVERY DAY  ? lisinopril (ZESTRIL) 40 MG tablet Take 1 tablet (40 mg total) by mouth daily.  ? metoprolol succinate (TOPROL-XL) 100 MG 24 hr tablet TAKE 1 TABLET BY MOUTH DAILY. TAKE WITH OR IMMEDIATELY FOLLOWING A MEAL.  ? Multiple Vitamin (MULTIVITAMIN) capsule Take 1 capsule by mouth daily.  ? nitroGLYCERIN (NITROSTAT) 0.4 MG SL tablet Place 1 tablet (0.4 mg total) under the tongue every 5 (five) minutes as needed for chest pain.  ? Omega-3 Fatty Acids (FISH OIL) 1000 MG CAPS Take by mouth daily. Krill oil  ? vitamin B-12 (CYANOCOBALAMIN) 100 MCG tablet Take 1,000 mcg by mouth daily.   ? VITAMIN D, ERGOCALCIFEROL, PO Take  1,000 Units by mouth daily.  ? ? ?Allergies: Codeine, Levofloxacin, Myrbetriq [mirabegron], Prednisone, and Sulfa antibiotics ? ?Social History  ? ?Tobacco Use  ? Smoking status: Never  ? Smokeless tobacco: Never  ?Vaping Use  ? Vaping Use: Never used  ?Substance Use Topics  ? Alcohol use: Yes  ?  Alcohol/week: 1.0 standard drink  ?  Types: 1 Cans of beer per week  ?  Comment: once a month  ? Drug use: No  ? ? ?Family History  ?Problem Relation Age of Onset  ? Stroke Mother   ? Breast cancer Maternal Grandmother 60  ? Heart disease Maternal Grandmother   ?  Cancer Brother   ?     skin  ? ? ?Review of Systems: ?A 12-system review of systems was performed and was negative except as noted in the HPI. ? ?-------------------------------------------------------------------------------------------------- ? ?Physical Exam: ?BP 130/74 (BP Location: Left Arm, Patient Position: Sitting, Cuff Size: Large)   Pulse 99   Ht '5\' 3"'  (1.6 m)   Wt 194 lb (88 kg)   LMP  (LMP Unknown)   SpO2 97%   BMI 34.37 kg/m?  ? ?General:  NAD. ?Neck: No JVD or HJR. ?Lungs: Clear to auscultation bilaterally without wheezes or crackles. ?Heart: Regular rate and rhythm with 2/6 systolic murmur. ?Abdomen: Soft, nontender, nondistended. ?Extremities: 1+ pretibial edema bilaterally. ? ?Lab Results  ?Component Value Date  ? WBC 8.8 09/01/2021  ? HGB 13.1 09/01/2021  ? HCT 40.3 09/01/2021  ? MCV 96.2 09/01/2021  ? PLT 285 09/01/2021  ? ? ?Lab Results  ?Component Value Date  ? NA 142 09/01/2021  ? K 3.5 09/01/2021  ? CL 105 09/01/2021  ? CO2 27 09/01/2021  ? BUN 26 (H) 09/01/2021  ? CREATININE 0.77 09/01/2021  ? GLUCOSE 116 (H) 09/01/2021  ? ALT 27 09/01/2021  ? ? ?Lab Results  ?Component Value Date  ? CHOL 160 06/29/2020  ? HDL 40 (L) 06/29/2020  ? [place] 93 06/29/2020  ? TRIG 133 06/29/2020  ? CHOLHDL 4.0 06/29/2020  ? ? ?-------------------------------------------------------------------------------------------------- ? ?ASSESSMENT AND PLAN: ?Coronary artery disease with stable angina: ?No further chest pain reported since our last visit.  We will continue with current medical therapy consisting of metoprolol succinate, amlodipine, and aspirin. ? ?Aortic stenosis: ?Angel [name] does not have any symptoms to suggest severe aortic stenosis.  Last echo in 06/2020 showed mild-moderate AS.  We will plan to repeat an echocardiogram to assess for progression shortly before her follow-up visit with me in 6 months. ? ?Hypertension: ?Blood pressure upper normal today.  Continue current regimen of lisinopril,  metoprolol, chlorthalidone, and amlodipine.  I will check a CMP today to ensure stable electrolytes and renal function. ? ?Hyperlipidemia: ?Continue atorvastatin 80 mg daily for target LDL less than 70.  We will check a lipid panel and ALT today. ? ?Follow-up: Return to clinic in 6 months with echocardiogram shortly before visit. ? ?Nelva Bush, MD ?11/10/2021 ?3:29 PM ? ?

## 2021-11-11 ENCOUNTER — Encounter: Payer: Self-pay | Admitting: Internal Medicine

## 2021-11-20 ENCOUNTER — Other Ambulatory Visit: Payer: Self-pay | Admitting: Interventional Cardiology

## 2021-11-22 ENCOUNTER — Other Ambulatory Visit: Payer: Self-pay | Admitting: Interventional Cardiology

## 2021-11-22 DIAGNOSIS — E785 Hyperlipidemia, unspecified: Secondary | ICD-10-CM

## 2021-11-23 ENCOUNTER — Ambulatory Visit: Payer: Medicare Other | Admitting: Physician Assistant

## 2021-12-02 DIAGNOSIS — Z6836 Body mass index (BMI) 36.0-36.9, adult: Secondary | ICD-10-CM | POA: Diagnosis not present

## 2021-12-02 DIAGNOSIS — E785 Hyperlipidemia, unspecified: Secondary | ICD-10-CM | POA: Diagnosis not present

## 2021-12-02 DIAGNOSIS — R7303 Prediabetes: Secondary | ICD-10-CM | POA: Diagnosis not present

## 2021-12-02 DIAGNOSIS — I10 Essential (primary) hypertension: Secondary | ICD-10-CM | POA: Diagnosis not present

## 2021-12-02 DIAGNOSIS — I350 Nonrheumatic aortic (valve) stenosis: Secondary | ICD-10-CM | POA: Diagnosis not present

## 2021-12-02 DIAGNOSIS — Z8673 Personal history of transient ischemic attack (TIA), and cerebral infarction without residual deficits: Secondary | ICD-10-CM | POA: Diagnosis not present

## 2021-12-02 DIAGNOSIS — J302 Other seasonal allergic rhinitis: Secondary | ICD-10-CM | POA: Diagnosis not present

## 2021-12-02 DIAGNOSIS — F419 Anxiety disorder, unspecified: Secondary | ICD-10-CM | POA: Diagnosis not present

## 2021-12-02 DIAGNOSIS — I25118 Atherosclerotic heart disease of native coronary artery with other forms of angina pectoris: Secondary | ICD-10-CM | POA: Diagnosis not present

## 2021-12-02 DIAGNOSIS — I77810 Thoracic aortic ectasia: Secondary | ICD-10-CM | POA: Diagnosis not present

## 2021-12-09 DIAGNOSIS — M9903 Segmental and somatic dysfunction of lumbar region: Secondary | ICD-10-CM | POA: Diagnosis not present

## 2021-12-09 DIAGNOSIS — M9905 Segmental and somatic dysfunction of pelvic region: Secondary | ICD-10-CM | POA: Diagnosis not present

## 2021-12-09 DIAGNOSIS — M5136 Other intervertebral disc degeneration, lumbar region: Secondary | ICD-10-CM | POA: Diagnosis not present

## 2021-12-09 DIAGNOSIS — M9902 Segmental and somatic dysfunction of thoracic region: Secondary | ICD-10-CM | POA: Diagnosis not present

## 2021-12-14 DIAGNOSIS — F419 Anxiety disorder, unspecified: Secondary | ICD-10-CM | POA: Diagnosis not present

## 2021-12-14 DIAGNOSIS — G4700 Insomnia, unspecified: Secondary | ICD-10-CM | POA: Diagnosis not present

## 2021-12-15 ENCOUNTER — Other Ambulatory Visit: Payer: Self-pay | Admitting: Internal Medicine

## 2021-12-23 DIAGNOSIS — G4700 Insomnia, unspecified: Secondary | ICD-10-CM | POA: Diagnosis not present

## 2021-12-28 ENCOUNTER — Telehealth: Payer: Self-pay | Admitting: Physician Assistant

## 2021-12-28 NOTE — Telephone Encounter (Signed)
Spoke with patient to schedule her AWV.    Patient stated she has change providers to [facility]

## 2021-12-28 NOTE — Telephone Encounter (Signed)
Noted  

## 2022-01-11 DIAGNOSIS — M5136 Other intervertebral disc degeneration, lumbar region: Secondary | ICD-10-CM | POA: Diagnosis not present

## 2022-01-11 DIAGNOSIS — M9905 Segmental and somatic dysfunction of pelvic region: Secondary | ICD-10-CM | POA: Diagnosis not present

## 2022-01-11 DIAGNOSIS — M9903 Segmental and somatic dysfunction of lumbar region: Secondary | ICD-10-CM | POA: Diagnosis not present

## 2022-01-11 DIAGNOSIS — M9902 Segmental and somatic dysfunction of thoracic region: Secondary | ICD-10-CM | POA: Diagnosis not present

## 2022-02-10 DIAGNOSIS — M9902 Segmental and somatic dysfunction of thoracic region: Secondary | ICD-10-CM | POA: Diagnosis not present

## 2022-02-10 DIAGNOSIS — M9903 Segmental and somatic dysfunction of lumbar region: Secondary | ICD-10-CM | POA: Diagnosis not present

## 2022-02-10 DIAGNOSIS — M9905 Segmental and somatic dysfunction of pelvic region: Secondary | ICD-10-CM | POA: Diagnosis not present

## 2022-02-10 DIAGNOSIS — M5136 Other intervertebral disc degeneration, lumbar region: Secondary | ICD-10-CM | POA: Diagnosis not present

## 2022-02-22 DIAGNOSIS — Z23 Encounter for immunization: Secondary | ICD-10-CM | POA: Diagnosis not present

## 2022-02-28 DIAGNOSIS — Z8673 Personal history of transient ischemic attack (TIA), and cerebral infarction without residual deficits: Secondary | ICD-10-CM | POA: Diagnosis not present

## 2022-02-28 DIAGNOSIS — F419 Anxiety disorder, unspecified: Secondary | ICD-10-CM | POA: Diagnosis not present

## 2022-02-28 DIAGNOSIS — I25118 Atherosclerotic heart disease of native coronary artery with other forms of angina pectoris: Secondary | ICD-10-CM | POA: Diagnosis not present

## 2022-02-28 DIAGNOSIS — I10 Essential (primary) hypertension: Secondary | ICD-10-CM | POA: Diagnosis not present

## 2022-02-28 DIAGNOSIS — Z7982 Long term (current) use of aspirin: Secondary | ICD-10-CM | POA: Diagnosis not present

## 2022-02-28 DIAGNOSIS — Z7902 Long term (current) use of antithrombotics/antiplatelets: Secondary | ICD-10-CM | POA: Diagnosis not present

## 2022-03-02 DIAGNOSIS — H6123 Impacted cerumen, bilateral: Secondary | ICD-10-CM | POA: Diagnosis not present

## 2022-03-16 ENCOUNTER — Telehealth: Payer: Self-pay | Admitting: *Deleted

## 2022-03-16 NOTE — Telephone Encounter (Signed)
-----   Message from Rica Mast sent at 03/11/2022  2:02 PM EDT ----- Regarding: RE: f/u appt Patient will call back to schedule. ----- Message ----- From: Solmon Ice, RN Sent: 03/11/2022  12:26 PM EDT To: Solmon Ice, RN; Rica Mast Subject: FW: f/u appt                                   Please schedule pt follow up after echo that is 05/13/22. Thanks! ----- Message ----- From: Solmon Ice, RN Sent: 03/08/2022   8:00 AM EDT To: Solmon Ice, RN Subject: f/u appt                                       Pt scheduled for echo 05/13/22, needs follow up scheduled with Dr. Saunders Revel or APP shortly after.  Office visit 11/10/21, echo scheduled but recall placed for follow up.  Make sure follow up scheduled once schedule opens up.

## 2022-03-17 DIAGNOSIS — M5136 Other intervertebral disc degeneration, lumbar region: Secondary | ICD-10-CM | POA: Diagnosis not present

## 2022-03-17 DIAGNOSIS — M9902 Segmental and somatic dysfunction of thoracic region: Secondary | ICD-10-CM | POA: Diagnosis not present

## 2022-03-17 DIAGNOSIS — M9903 Segmental and somatic dysfunction of lumbar region: Secondary | ICD-10-CM | POA: Diagnosis not present

## 2022-03-17 DIAGNOSIS — M9905 Segmental and somatic dysfunction of pelvic region: Secondary | ICD-10-CM | POA: Diagnosis not present

## 2022-03-19 ENCOUNTER — Other Ambulatory Visit: Payer: Self-pay | Admitting: Internal Medicine

## 2022-04-12 DIAGNOSIS — M9903 Segmental and somatic dysfunction of lumbar region: Secondary | ICD-10-CM | POA: Diagnosis not present

## 2022-04-12 DIAGNOSIS — M9902 Segmental and somatic dysfunction of thoracic region: Secondary | ICD-10-CM | POA: Diagnosis not present

## 2022-04-12 DIAGNOSIS — M9905 Segmental and somatic dysfunction of pelvic region: Secondary | ICD-10-CM | POA: Diagnosis not present

## 2022-04-12 DIAGNOSIS — M5136 Other intervertebral disc degeneration, lumbar region: Secondary | ICD-10-CM | POA: Diagnosis not present

## 2022-04-20 DIAGNOSIS — M19071 Primary osteoarthritis, right ankle and foot: Secondary | ICD-10-CM | POA: Diagnosis not present

## 2022-05-04 ENCOUNTER — Ambulatory Visit: Payer: Self-pay

## 2022-05-04 ENCOUNTER — Ambulatory Visit (INDEPENDENT_AMBULATORY_CARE_PROVIDER_SITE_OTHER): Payer: Medicare Other | Admitting: Family Medicine

## 2022-05-04 ENCOUNTER — Encounter: Payer: Self-pay | Admitting: Family Medicine

## 2022-05-04 VITALS — BP 157/78 | Ht 63.0 in | Wt 191.0 lb

## 2022-05-04 DIAGNOSIS — M7750 Other enthesopathy of unspecified foot: Secondary | ICD-10-CM

## 2022-05-04 DIAGNOSIS — M25571 Pain in right ankle and joints of right foot: Secondary | ICD-10-CM

## 2022-05-04 DIAGNOSIS — G8929 Other chronic pain: Secondary | ICD-10-CM

## 2022-05-04 MED ORDER — METHYLPREDNISOLONE ACETATE 40 MG/ML IJ SUSP
40.0000 mg | Freq: Once | INTRAMUSCULAR | Status: AC
  Administered 2022-05-04: 40 mg via INTRA_ARTICULAR

## 2022-05-04 MED ORDER — KETOROLAC TROMETHAMINE 60 MG/2ML IM SOLN: 60.0000 mg | Freq: Once | INTRAMUSCULAR | Status: DC

## 2022-05-04 NOTE — Progress Notes (Signed)
PCP: No primary care provider on file.  Subjective:   HPI: Patient is a 88 y.o. female here for right subtalar joint injection.  Patient referred from [facility] for right subtalar joint injection. She had subtalar joint injection in November, sinus tarsi injection in February and both had helped with her pain. Radiographs brought with her today showing mod-severe subtalar arthropathy.  Past Medical History:  Diagnosis Date   Acute CVA (cerebrovascular accident) ([place]) 06/28/2020   Anxiety    Aortic aneurysm without rupture (HCC)    CAD (coronary artery disease)    Chronic hip pain    Endometriosis    Hearing loss    HTN, goal below 150/90    Hyperlipidemia LDL goal <70 11/18/2014   Osteopenia of the elderly    Urge and stress incontinence     Current Outpatient Medications on File Prior to Visit  Medication Sig Dispense Refill   amLODipine (NORVASC) 5 MG tablet TAKE 1 TABLET (5 MG TOTAL) BY MOUTH DAILY. 90 tablet 1   Ascorbic Acid (VITAMIN C) 100 MG tablet Take 500 mg by mouth daily. Gummy '750mg'$      aspirin EC 81 MG EC tablet Take 1 tablet (81 mg total) by mouth daily. Swallow whole. 30 tablet 11   atorvastatin (LIPITOR) 80 MG tablet TAKE 1 TABLET BY MOUTH EVERY DAY 90 tablet 3   busPIRone (BUSPAR) 5 MG tablet Take 1 tablet (5 mg total) by mouth 2 (two) times daily. 60 tablet 2   chlorthalidone (HYGROTON) 25 MG tablet TAKE 1/2 TABLET BY MOUTH EVERY DAY 45 tablet 3   lisinopril (ZESTRIL) 40 MG tablet TAKE 1 TABLET BY MOUTH EVERY DAY 90 tablet 0   metoprolol succinate (TOPROL-XL) 100 MG 24 hr tablet TAKE 1 TABLET BY MOUTH DAILY. TAKE WITH OR IMMEDIATELY FOLLOWING A MEAL. 90 tablet 3   Multiple Vitamin (MULTIVITAMIN) capsule Take 1 capsule by mouth daily.     nitroGLYCERIN (NITROSTAT) 0.4 MG SL tablet Place 1 tablet (0.4 mg total) under the tongue every 5 (five) minutes as needed for chest pain. 25 tablet 3   Omega-3 Fatty Acids (FISH OIL) 1000 MG CAPS Take by mouth daily. Krill  oil     vitamin B-12 (CYANOCOBALAMIN) 100 MCG tablet Take 1,000 mcg by mouth daily.      VITAMIN D, ERGOCALCIFEROL, PO Take 1,000 Units by mouth daily.     No current facility-administered medications on file prior to visit.    Past Surgical History:  Procedure Laterality Date   ABDOMINAL HYSTERECTOMY  1976   total   APPENDECTOMY     BREAST EXCISIONAL BIOPSY Left 1987   benign   CATARACT EXTRACTION, BILATERAL  08/2018   Dr. Manuella Ghazi with [facility]    Allergies  Allergen Reactions   Codeine Other (See Comments)    "zoned out"   Levofloxacin Other (See Comments)    AMS   Myrbetriq [Mirabegron] Other (See Comments)    Severe depression   Prednisone Other (See Comments)    AMS   Sulfa Antibiotics Other (See Comments)    AMS    BP (!) 157/78   Ht '5\' 3"'$  (1.6 m)   Wt 191 lb (86.6 kg)   LMP  (LMP Unknown)   BMI 33.83 kg/m       No data to display              No data to display              Objective:  Physical Exam:  Gen: NAD, comfortable in exam room   Assessment & Plan:  1. Right ankle pain - performed ultrasound-guided subtalar joint noted below.  After informed written consent timeout was performed.  Patient was lying on exam table in left lateral decubitus position.  Right subtalar joint identified with effusion noted.  Alcohol swabs used for prep then utilizing ultrasound guidance, right subtalar joint was injected in an out-of-plane approach with 1:1 lidocaine: depomedrol '40mg'$ .  Patient tolerated procedure well without immediate complications.

## 2022-05-13 ENCOUNTER — Other Ambulatory Visit: Payer: Medicare Other

## 2022-05-17 DIAGNOSIS — M5136 Other intervertebral disc degeneration, lumbar region: Secondary | ICD-10-CM | POA: Diagnosis not present

## 2022-05-17 DIAGNOSIS — M9905 Segmental and somatic dysfunction of pelvic region: Secondary | ICD-10-CM | POA: Diagnosis not present

## 2022-05-17 DIAGNOSIS — M9903 Segmental and somatic dysfunction of lumbar region: Secondary | ICD-10-CM | POA: Diagnosis not present

## 2022-05-17 DIAGNOSIS — M9902 Segmental and somatic dysfunction of thoracic region: Secondary | ICD-10-CM | POA: Diagnosis not present

## 2022-05-18 ENCOUNTER — Ambulatory Visit: Payer: Medicare Other | Attending: Internal Medicine

## 2022-05-18 DIAGNOSIS — I350 Nonrheumatic aortic (valve) stenosis: Secondary | ICD-10-CM | POA: Insufficient documentation

## 2022-05-18 LAB — ECHOCARDIOGRAM COMPLETE
AR max vel: 1.11 cm2
AV Area VTI: 1.12 cm2
AV Area mean vel: 1.03 cm2
AV Mean grad: 12 mmHg
AV Peak grad: 19 mmHg
Ao pk vel: 2.18 m/s
Area-P 1/2: 4.93 cm2
Calc EF: 68.6 %
S' Lateral: 2.3 cm
Single Plane A2C EF: 71.2 %
Single Plane A4C EF: 70.3 %

## 2022-05-20 ENCOUNTER — Ambulatory Visit: Payer: Medicare Other | Attending: Internal Medicine | Admitting: Internal Medicine

## 2022-05-20 ENCOUNTER — Encounter: Payer: Self-pay | Admitting: Internal Medicine

## 2022-05-20 VITALS — BP 120/70 | HR 76 | Ht 63.0 in

## 2022-05-20 DIAGNOSIS — E785 Hyperlipidemia, unspecified: Secondary | ICD-10-CM

## 2022-05-20 DIAGNOSIS — I10 Essential (primary) hypertension: Secondary | ICD-10-CM | POA: Diagnosis not present

## 2022-05-20 DIAGNOSIS — I25118 Atherosclerotic heart disease of native coronary artery with other forms of angina pectoris: Secondary | ICD-10-CM

## 2022-05-20 DIAGNOSIS — I350 Nonrheumatic aortic (valve) stenosis: Secondary | ICD-10-CM | POA: Diagnosis not present

## 2022-05-20 NOTE — Patient Instructions (Signed)
Medication Instructions:  Your physician recommends that you continue on your current medications as directed. Please refer to the Current Medication list given to you today.  *If you need a refill on your cardiac medications before your next appointment, please call your pharmacy*  Lab Work: NONE ordered at this time of appointment   If you have labs (blood work) drawn today and your tests are completely normal, you will receive your results only by: [facility] (if you have MyChart) OR A paper copy in the mail If you have any lab test that is abnormal or we need to change your treatment, we will call you to review the results.  Testing/Procedures: NONE ordered at this time of appointment   Follow-Up: At [facility], you and your health needs are our priority.  As part of our continuing mission to provide you with exceptional heart care, we have created designated Provider Care Teams.  These Care Teams include your primary Cardiologist (physician) and Advanced Practice Providers (APPs -  Physician Assistants and Nurse Practitioners) who all work together to provide you with the care you need, when you need it.   Your next appointment:   6 month(s)  The format for your next appointment:   In Person  Provider:   Nelva Bush, MD    Other Instructions   Important Information About Sugar      .

## 2022-05-20 NOTE — Progress Notes (Signed)
Follow-up Outpatient Visit Date: 05/20/2022  Primary Care Provider: Patient, No Pcp Per No address on file  Chief Complaint: Follow-up stable angina and aortic stenosis  HPI:  Angel [name] is a 88 y.o. female with history of  poor R wave progression by EKG, abnormal myocardial perfusion stress test, and question of small apical infarct versus diverticulum by cardiac MRI, as well as hypertension, hyperlipidemia, aortic stenosis with enlargement of the thoracic aorta, and left pontine stroke (11/21), who presents for follow-up of chest pain and aortic stenosis.  I last saw her in April, at which time she was feeling well other than stable exertional dyspnea when walking extended distances or up an incline.  Follow-up echocardiogram 2 days ago showed LVEF 60-65% with grade 1 diastolic dysfunction and mild-moderate AR stenosis (mean gradient 12 mmHg, AVA 1.1 cm).  Today, Angel [name] reports that she has been feeling fairly well.  Her biggest concern has been arthritis that predominantly involves her hips and knees.  This limits her mobility.  She reports a single episode of chest pain that woke her up at night.  It lasted about 15 to 20 minutes and resolved after she took two sublingual nitroglycerin.  She has not had any further chest pain, including when she is active.  She also denies shortness of breath, palpitations, and lightheadedness.  She has had some mild lower extremity edema.  Home blood pressures are typically around 140/90 in the morning but sometimes increase in the afternoon.  --------------------------------------------------------------------------------------------------  Past Medical History:  Diagnosis Date   Acute CVA (cerebrovascular accident) ([place]) 06/28/2020   Anxiety    Aortic aneurysm without rupture (HCC)    Aortic stenosis    mild-moderate by echo   CAD (coronary artery disease)    Chronic hip pain    Endometriosis    Hearing loss    HTN, goal below 150/90     Hyperlipidemia LDL goal <70 11/18/2014   Osteopenia of the elderly    Urge and stress incontinence    Past Surgical History:  Procedure Laterality Date   ABDOMINAL HYSTERECTOMY  1976   total   APPENDECTOMY     BREAST EXCISIONAL BIOPSY Left 1987   benign   CATARACT EXTRACTION, BILATERAL  08/2018   Dr. Manuella Ghazi with [facility]    Current Meds  Medication Sig   amLODipine (NORVASC) 5 MG tablet TAKE 1 TABLET (5 MG TOTAL) BY MOUTH DAILY.   Ascorbic Acid (VITAMIN C) 100 MG tablet Take 500 mg by mouth daily. Gummy '750mg'$    aspirin EC 81 MG EC tablet Take 1 tablet (81 mg total) by mouth daily. Swallow whole.   atorvastatin (LIPITOR) 80 MG tablet TAKE 1 TABLET BY MOUTH EVERY DAY   budesonide (RHINOCORT AQUA) 32 MCG/ACT nasal spray as needed.   busPIRone (BUSPAR) 5 MG tablet Take 1 tablet (5 mg total) by mouth 2 (two) times daily.   chlorthalidone (HYGROTON) 25 MG tablet TAKE 1/2 TABLET BY MOUTH EVERY DAY   lisinopril (ZESTRIL) 40 MG tablet TAKE 1 TABLET BY MOUTH EVERY DAY   loratadine (CLARITIN) 10 MG tablet Take 10 mg by mouth daily as needed for allergies.   metoprolol succinate (TOPROL-XL) 100 MG 24 hr tablet TAKE 1 TABLET BY MOUTH DAILY. TAKE WITH OR IMMEDIATELY FOLLOWING A MEAL.   Multiple Vitamin (MULTIVITAMIN) capsule Take 1 capsule by mouth daily.   nitroGLYCERIN (NITROSTAT) 0.4 MG SL tablet Place 1 tablet (0.4 mg total) under the tongue every 5 (five) minutes as needed  for chest pain.   Omega-3 Fatty Acids (FISH OIL) 1000 MG CAPS Take by mouth daily. Krill oil   vitamin B-12 (CYANOCOBALAMIN) 100 MCG tablet Take 1,000 mcg by mouth daily.    VITAMIN D PO Take 1 tablet by mouth daily.    Allergies: Codeine, Levofloxacin, Myrbetriq [mirabegron], Prednisone, and Sulfa antibiotics  Social History   Tobacco Use   Smoking status: Never   Smokeless tobacco: Never  Vaping Use   Vaping Use: Never used  Substance Use Topics   Alcohol use: Yes    Alcohol/week: 1.0 standard drink  of alcohol    Types: 1 Cans of beer per week    Comment: once a month   Drug use: No    Family History  Problem Relation Age of Onset   Stroke Mother    Cancer Brother        skin   Breast cancer Maternal Grandmother 64   Heart disease Maternal Grandmother     Review of Systems: A 12-system review of systems was performed and was negative except as noted in the HPI.  --------------------------------------------------------------------------------------------------  Physical Exam: BP 120/70 (BP Location: Right Arm, Patient Position: Sitting, Cuff Size: Large)   Pulse 76   Ht '5\' 3"'$  (1.6 m)   LMP  (LMP Unknown)   SpO2 94%   BMI 33.83 kg/m   General:  NAD. Neck: No JVD or HJR. Lungs: Clear to auscultation bilaterally without wheezes or crackles. Heart: Regular rate and rhythm with 2/6 systolic murmur.  No rubs or gallops. Abdomen: Soft, nontender, nondistended. Extremities: Trace pretibial edema bilaterally.  EKG: Normal sinus rhythm with possible left atrial enlargement, LVH, and possible lateral infarct.  No significant change from prior tracing on 07/22/2021.  Lab Results  Component Value Date   WBC 8.8 09/01/2021   HGB 13.1 09/01/2021   HCT 40.3 09/01/2021   MCV 96.2 09/01/2021   PLT 285 09/01/2021    Lab Results  Component Value Date   NA 139 11/10/2021   K 3.2 (L) 11/10/2021   CL 106 11/10/2021   CO2 23 11/10/2021   BUN 35 (H) 11/10/2021   CREATININE 0.93 11/10/2021   GLUCOSE 214 (H) 11/10/2021   ALT 25 11/10/2021    Lab Results  Component Value Date   CHOL 169 11/10/2021   HDL 47 11/10/2021   LDLCALC 79 11/10/2021   TRIG 215 (H) 11/10/2021   CHOLHDL 3.6 11/10/2021    --------------------------------------------------------------------------------------------------  ASSESSMENT AND PLAN: Coronary artery disease with stable angina: Angel [name] reports a single episode of chest pain that woke her up at night and resolved after sublingual  nitroglycerin x2.  She has not had any further chest pain including with activity.  We discussed further ischemia evaluation but have agreed to defer this given the isolated nature of her event.  We will continue current regimen of amlodipine and metoprolol succinate for antianginal therapy as well as secondary prevention with aspirin and atorvastatin.  Mild-moderate aortic stenosis: Angel [name] does not have any symptoms to suggest critical aortic stenosis.  Echocardiogram earlier this week again showed stable mild-moderate AS.  Continue clinical follow-up with repeat echocardiogram in 2 years (sooner if symptoms progress).  Hypertension: Blood pressure well controlled today.  No medication changes at this time.  Hyperlipidemia: LDL just above goal on last check in April.  We will continue atorvastatin 80 mg daily and work on lifestyle modifications to help improve her lipids.  Follow-up: Return to clinic in 6 months.  Darryn Kydd,  MD 05/20/2022 11:19 AM

## 2022-05-22 ENCOUNTER — Other Ambulatory Visit: Payer: Self-pay | Admitting: Internal Medicine

## 2022-05-26 DIAGNOSIS — Z23 Encounter for immunization: Secondary | ICD-10-CM | POA: Diagnosis not present

## 2022-06-16 DIAGNOSIS — M5136 Other intervertebral disc degeneration, lumbar region: Secondary | ICD-10-CM | POA: Diagnosis not present

## 2022-06-16 DIAGNOSIS — M9905 Segmental and somatic dysfunction of pelvic region: Secondary | ICD-10-CM | POA: Diagnosis not present

## 2022-06-16 DIAGNOSIS — M9903 Segmental and somatic dysfunction of lumbar region: Secondary | ICD-10-CM | POA: Diagnosis not present

## 2022-06-16 DIAGNOSIS — M9902 Segmental and somatic dysfunction of thoracic region: Secondary | ICD-10-CM | POA: Diagnosis not present

## 2022-06-19 ENCOUNTER — Other Ambulatory Visit: Payer: Self-pay | Admitting: Internal Medicine

## 2022-06-29 ENCOUNTER — Other Ambulatory Visit: Payer: Self-pay | Admitting: Adult Health

## 2022-06-29 DIAGNOSIS — E785 Hyperlipidemia, unspecified: Secondary | ICD-10-CM | POA: Diagnosis not present

## 2022-06-29 DIAGNOSIS — I77810 Thoracic aortic ectasia: Secondary | ICD-10-CM | POA: Diagnosis not present

## 2022-06-29 DIAGNOSIS — I10 Essential (primary) hypertension: Secondary | ICD-10-CM | POA: Diagnosis not present

## 2022-06-29 DIAGNOSIS — M810 Age-related osteoporosis without current pathological fracture: Secondary | ICD-10-CM

## 2022-06-29 DIAGNOSIS — Z9189 Other specified personal risk factors, not elsewhere classified: Secondary | ICD-10-CM | POA: Diagnosis not present

## 2022-06-29 DIAGNOSIS — Z79899 Other long term (current) drug therapy: Secondary | ICD-10-CM | POA: Diagnosis not present

## 2022-06-29 DIAGNOSIS — I25118 Atherosclerotic heart disease of native coronary artery with other forms of angina pectoris: Secondary | ICD-10-CM | POA: Diagnosis not present

## 2022-06-29 DIAGNOSIS — I110 Hypertensive heart disease with heart failure: Secondary | ICD-10-CM | POA: Diagnosis not present

## 2022-06-29 DIAGNOSIS — R7309 Other abnormal glucose: Secondary | ICD-10-CM | POA: Diagnosis not present

## 2022-06-29 DIAGNOSIS — I350 Nonrheumatic aortic (valve) stenosis: Secondary | ICD-10-CM | POA: Diagnosis not present

## 2022-06-29 DIAGNOSIS — I509 Heart failure, unspecified: Secondary | ICD-10-CM | POA: Diagnosis not present

## 2022-06-29 DIAGNOSIS — M545 Low back pain, unspecified: Secondary | ICD-10-CM | POA: Diagnosis not present

## 2022-06-29 DIAGNOSIS — R7303 Prediabetes: Secondary | ICD-10-CM | POA: Diagnosis not present

## 2022-07-03 IMAGING — CR DG FOOT COMPLETE 3+V*R*
3 series · 3 of 3 positions shown · non-contrast
Comparison: None.

CLINICAL DATA: 89-year-old female with right ankle and foot pain
after fall.

EXAM:
RIGHT FOOT COMPLETE - 3+ VIEW; RIGHT ANKLE - COMPLETE 3+ VIEW

[x foot ap right]
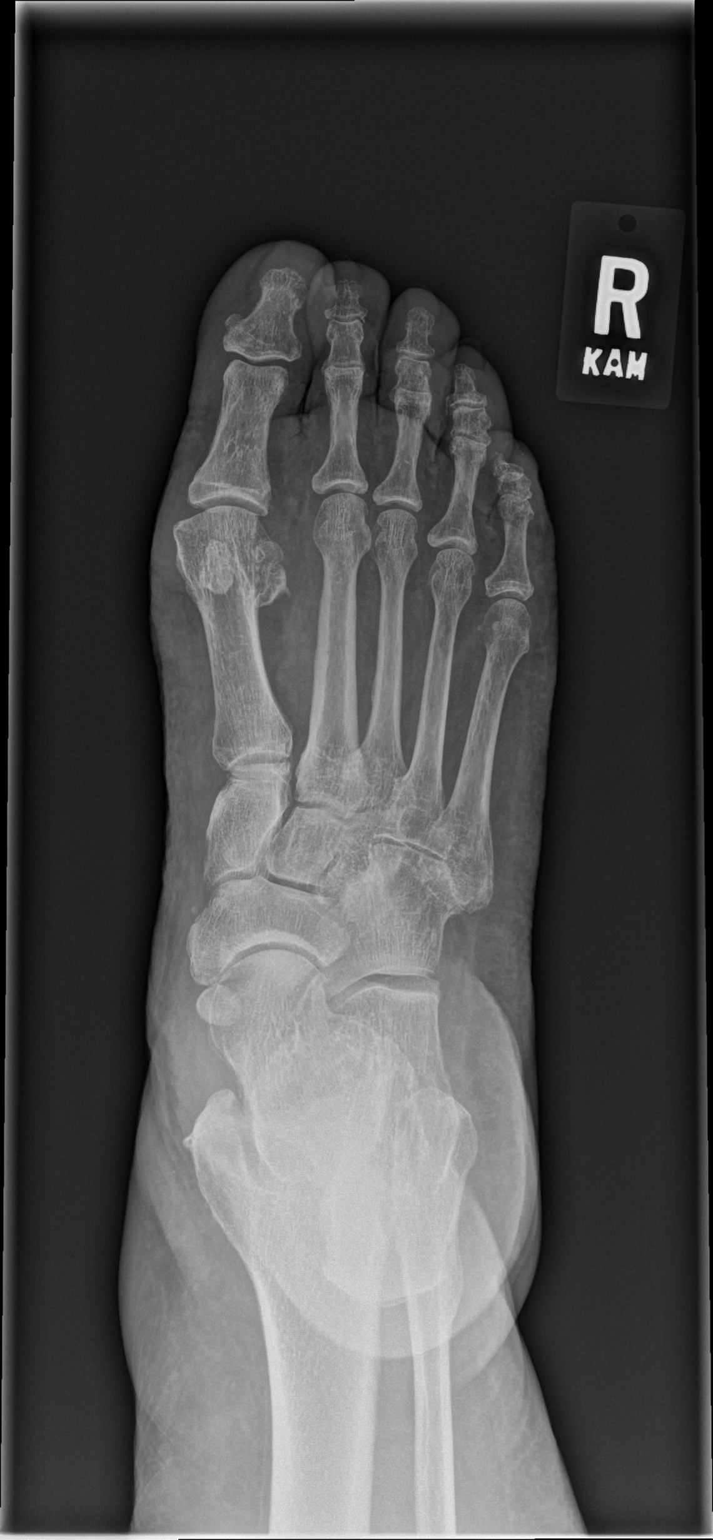

[x foot obl right]
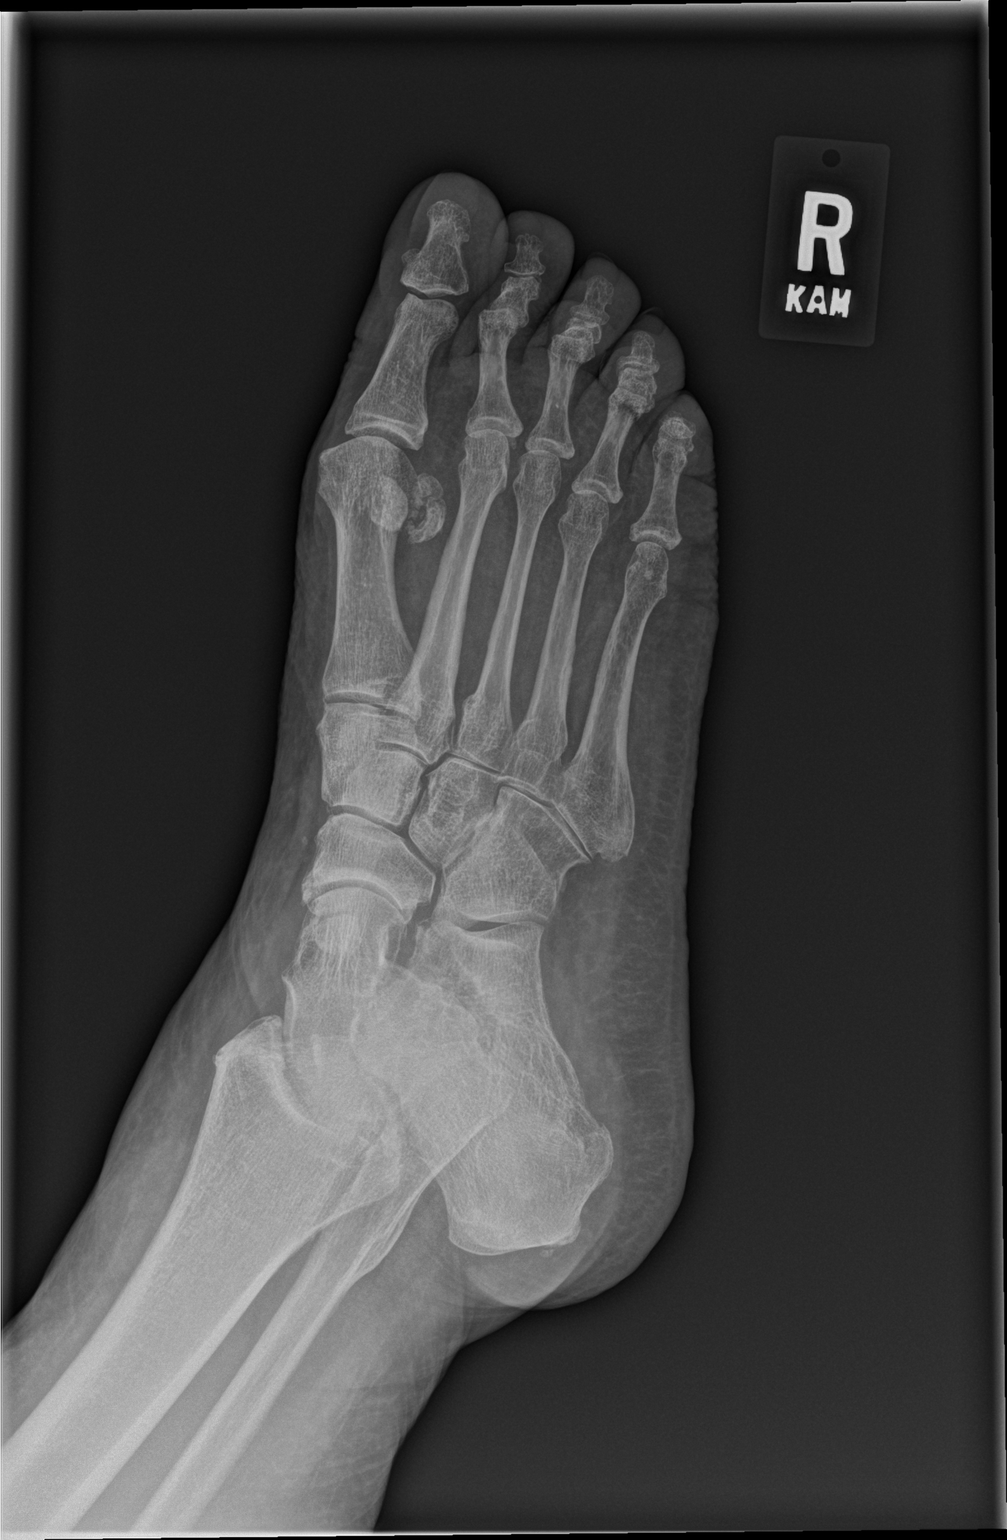

[x foot lat right]
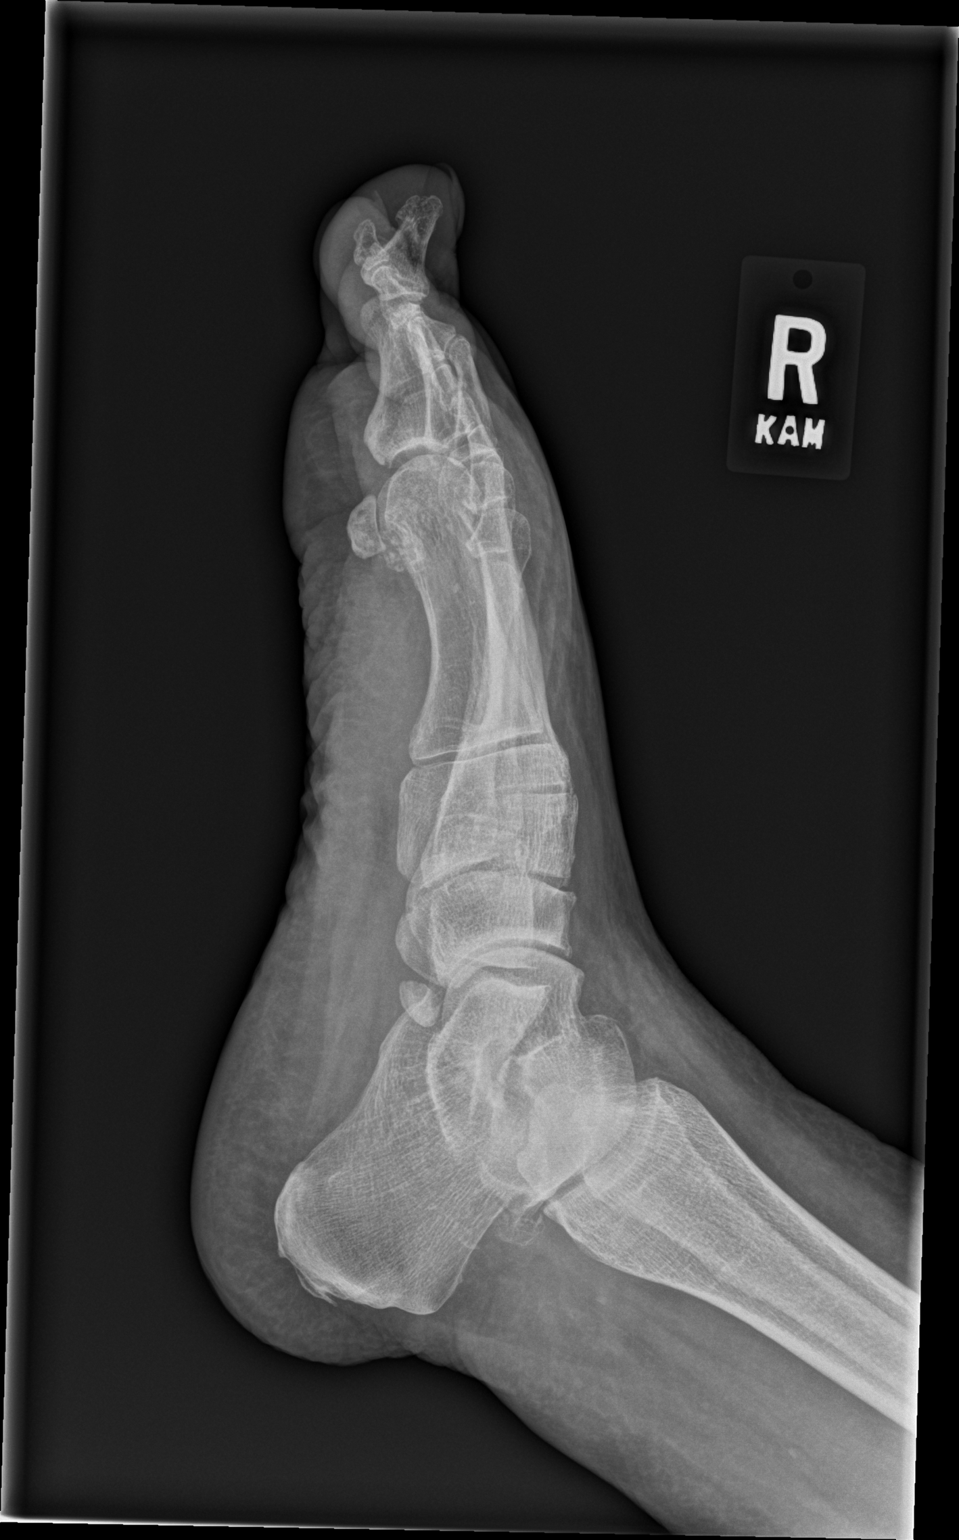

[3 of 3 positions shown; findings below may reference images not displayed]

FINDINGS: There is no evidence of fracture or dislocation. There is no
evidence of arthropathy or other focal bone abnormality. Soft
tissues are unremarkable.
IMPRESSION: No acute fracture or malalignment.

## 2022-07-03 IMAGING — CR DG ANKLE COMPLETE 3+V*R*
3 series · 3 of 3 positions shown · non-contrast
Comparison: None.

CLINICAL DATA: 89-year-old female with right ankle and foot pain
after fall.

EXAM:
RIGHT FOOT COMPLETE - 3+ VIEW; RIGHT ANKLE - COMPLETE 3+ VIEW

[x ankle ap right]
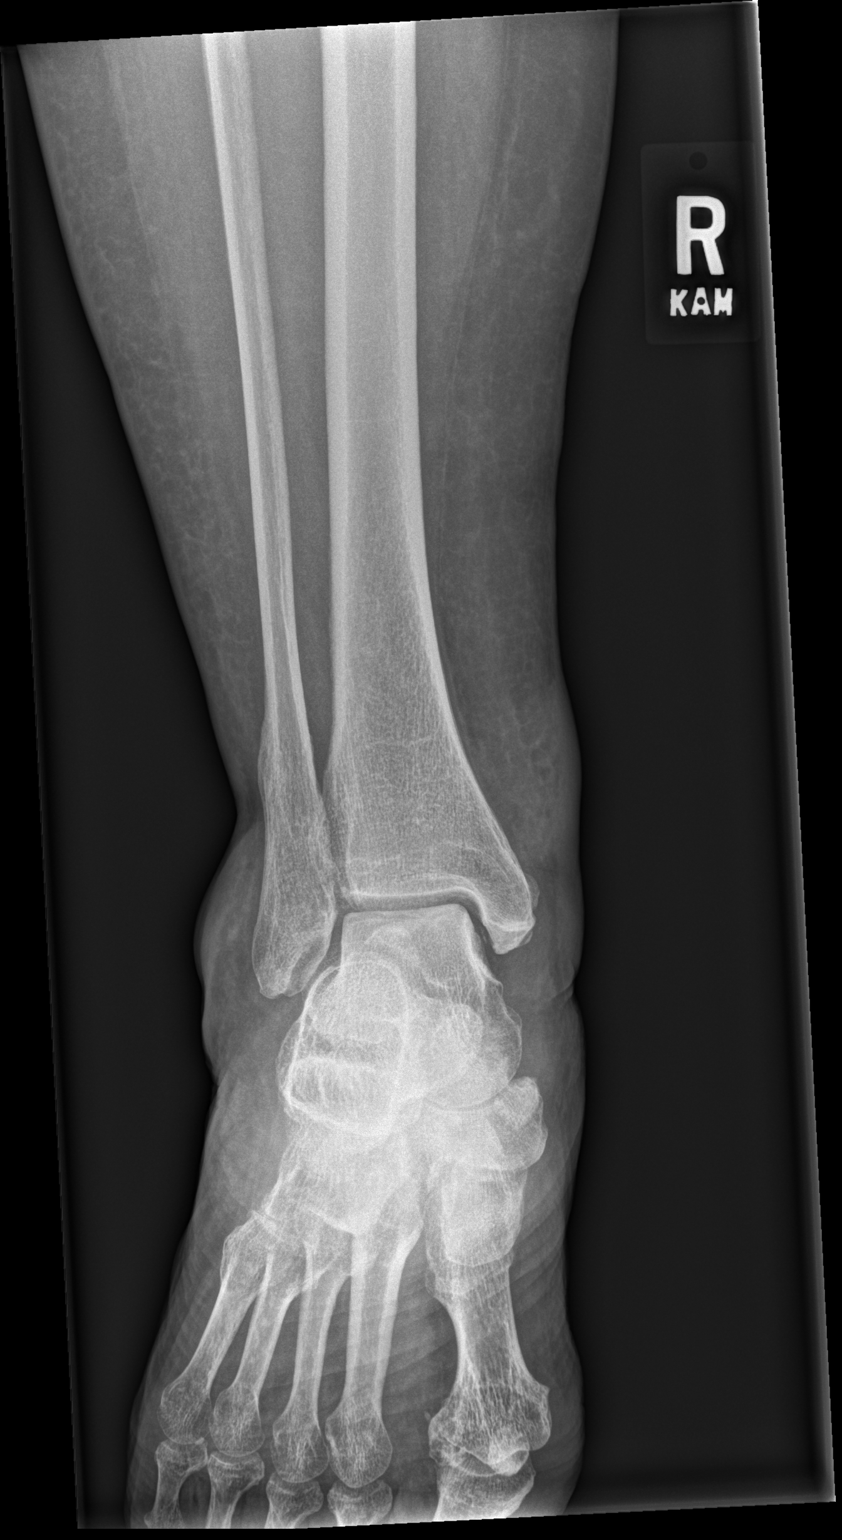

[x ankle obl right]
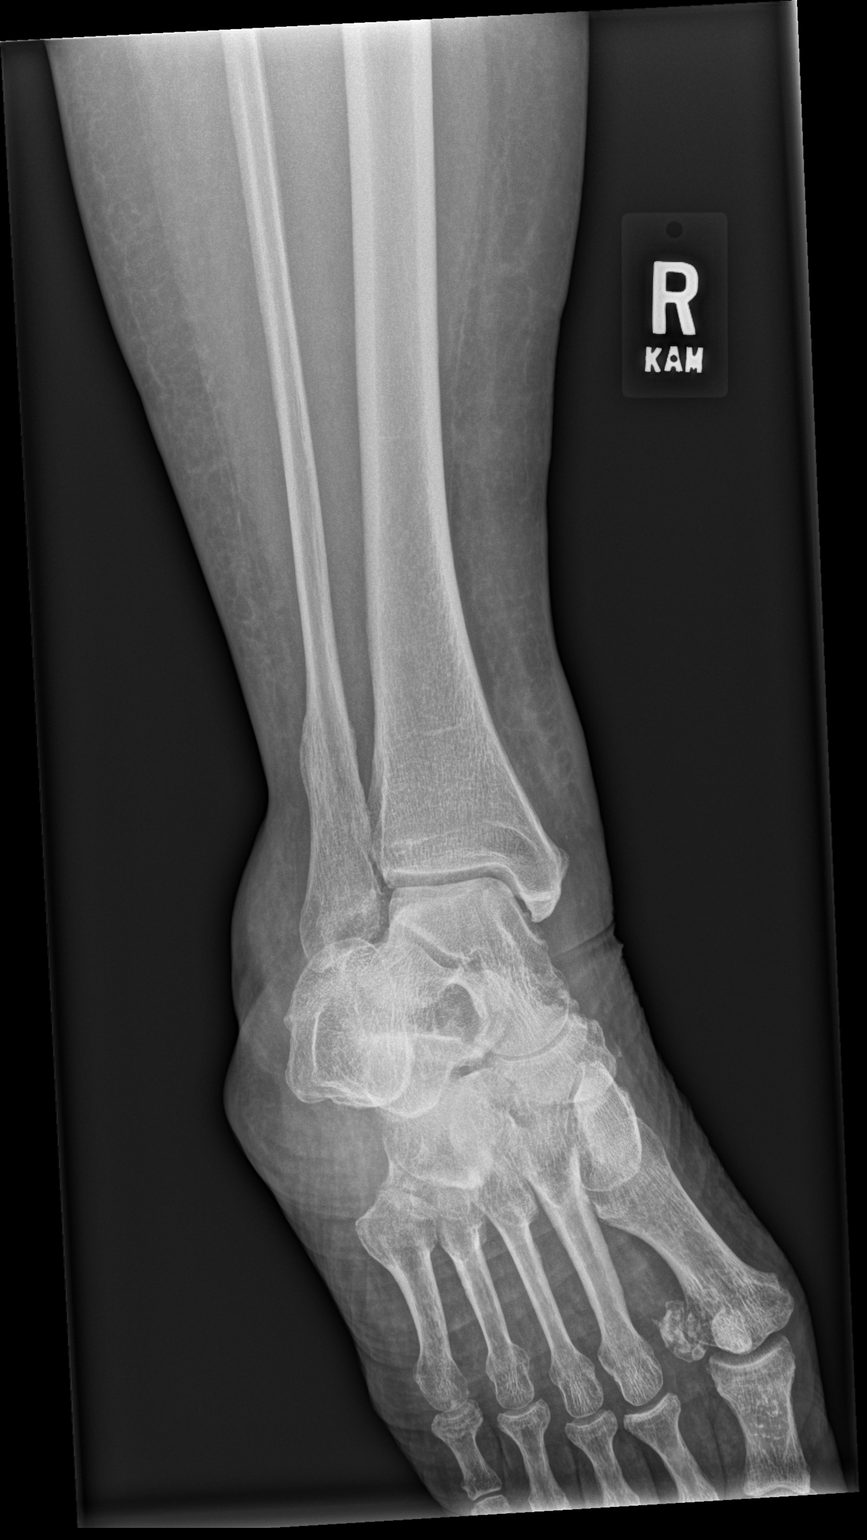

[x ankle lat right]
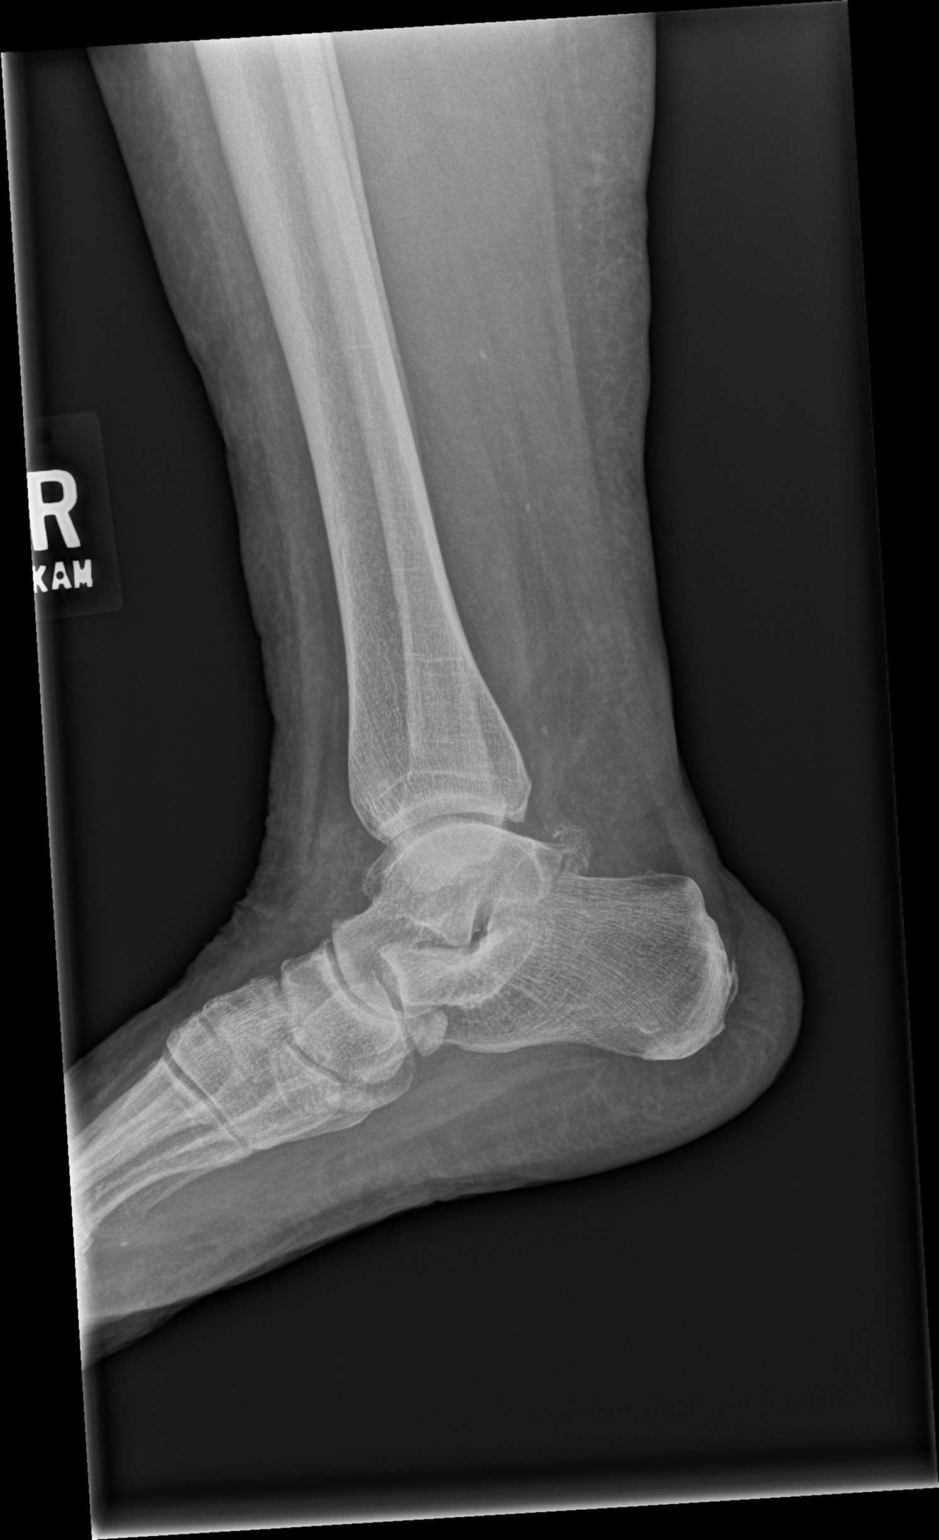

[3 of 3 positions shown; findings below may reference images not displayed]

FINDINGS: There is no evidence of fracture or dislocation. There is no
evidence of arthropathy or other focal bone abnormality. Soft
tissues are unremarkable.
IMPRESSION: No acute fracture or malalignment.

## 2022-07-14 DIAGNOSIS — M9903 Segmental and somatic dysfunction of lumbar region: Secondary | ICD-10-CM | POA: Diagnosis not present

## 2022-07-14 DIAGNOSIS — M5136 Other intervertebral disc degeneration, lumbar region: Secondary | ICD-10-CM | POA: Diagnosis not present

## 2022-07-14 DIAGNOSIS — M9905 Segmental and somatic dysfunction of pelvic region: Secondary | ICD-10-CM | POA: Diagnosis not present

## 2022-07-14 DIAGNOSIS — M9902 Segmental and somatic dysfunction of thoracic region: Secondary | ICD-10-CM | POA: Diagnosis not present

## 2022-08-16 DIAGNOSIS — M9902 Segmental and somatic dysfunction of thoracic region: Secondary | ICD-10-CM | POA: Diagnosis not present

## 2022-08-16 DIAGNOSIS — M9905 Segmental and somatic dysfunction of pelvic region: Secondary | ICD-10-CM | POA: Diagnosis not present

## 2022-08-16 DIAGNOSIS — M5136 Other intervertebral disc degeneration, lumbar region: Secondary | ICD-10-CM | POA: Diagnosis not present

## 2022-08-16 DIAGNOSIS — M9903 Segmental and somatic dysfunction of lumbar region: Secondary | ICD-10-CM | POA: Diagnosis not present

## 2022-08-25 ENCOUNTER — Other Ambulatory Visit: Payer: Self-pay | Admitting: Interventional Cardiology

## 2022-08-26 NOTE — Telephone Encounter (Signed)
Please advise if ok to refill last filled by Dr. Tamala Julian

## 2022-08-27 ENCOUNTER — Telehealth: Payer: Self-pay | Admitting: Physician Assistant

## 2022-08-27 ENCOUNTER — Other Ambulatory Visit: Payer: Self-pay | Admitting: Interventional Cardiology

## 2022-08-27 NOTE — Telephone Encounter (Signed)
Called [facility], they had not received the Toprol RX. I refilled prior Toprol XL 100 mg daily #90, RF 3. Patient aware.

## 2022-08-27 NOTE — Telephone Encounter (Signed)
Patient called needing a refill of Toprol XL. Review of chart shows this was sent [facility] to [facility] [place] yesterday. I confirmed with her that this is the correct pharmacy. She will call the pharmacy when they open to ensure they have the Rx. If they do not, she will call us back. Patient does not need an OV, she was just seen in 05/2022 with recommendation to follow up in 6 months.

## 2022-08-27 NOTE — Telephone Encounter (Signed)
Patient called back for refill of metoprolol. Line busy and could not reach her.

## 2022-10-25 DIAGNOSIS — Z23 Encounter for immunization: Secondary | ICD-10-CM | POA: Diagnosis not present

## 2022-10-26 DIAGNOSIS — M2012 Hallux valgus (acquired), left foot: Secondary | ICD-10-CM | POA: Diagnosis not present

## 2022-10-26 DIAGNOSIS — M2142 Flat foot [pes planus] (acquired), left foot: Secondary | ICD-10-CM | POA: Diagnosis not present

## 2022-10-26 DIAGNOSIS — M2141 Flat foot [pes planus] (acquired), right foot: Secondary | ICD-10-CM | POA: Diagnosis not present

## 2022-10-26 DIAGNOSIS — M25571 Pain in right ankle and joints of right foot: Secondary | ICD-10-CM | POA: Diagnosis not present

## 2022-10-26 DIAGNOSIS — M19071 Primary osteoarthritis, right ankle and foot: Secondary | ICD-10-CM | POA: Diagnosis not present

## 2022-10-26 DIAGNOSIS — M2042 Other hammer toe(s) (acquired), left foot: Secondary | ICD-10-CM | POA: Diagnosis not present

## 2022-10-26 DIAGNOSIS — M7671 Peroneal tendinitis, right leg: Secondary | ICD-10-CM | POA: Diagnosis not present

## 2022-11-20 ENCOUNTER — Other Ambulatory Visit: Payer: Self-pay | Admitting: Internal Medicine

## 2022-11-20 DIAGNOSIS — E785 Hyperlipidemia, unspecified: Secondary | ICD-10-CM

## 2022-11-21 NOTE — Telephone Encounter (Signed)
Left Voicemail to schedule appt 

## 2022-11-21 NOTE — Telephone Encounter (Signed)
Please contact pt for future appointment. Pt due for f/u.  

## 2022-12-08 DIAGNOSIS — M9902 Segmental and somatic dysfunction of thoracic region: Secondary | ICD-10-CM | POA: Diagnosis not present

## 2022-12-08 DIAGNOSIS — M9903 Segmental and somatic dysfunction of lumbar region: Secondary | ICD-10-CM | POA: Diagnosis not present

## 2022-12-08 DIAGNOSIS — M9905 Segmental and somatic dysfunction of pelvic region: Secondary | ICD-10-CM | POA: Diagnosis not present

## 2022-12-08 DIAGNOSIS — M5136 Other intervertebral disc degeneration, lumbar region: Secondary | ICD-10-CM | POA: Diagnosis not present

## 2022-12-14 DIAGNOSIS — H6123 Impacted cerumen, bilateral: Secondary | ICD-10-CM | POA: Diagnosis not present

## 2022-12-14 DIAGNOSIS — H903 Sensorineural hearing loss, bilateral: Secondary | ICD-10-CM | POA: Diagnosis not present

## 2022-12-16 ENCOUNTER — Other Ambulatory Visit: Payer: Self-pay | Admitting: Internal Medicine

## 2022-12-19 DIAGNOSIS — I77810 Thoracic aortic ectasia: Secondary | ICD-10-CM | POA: Diagnosis not present

## 2022-12-19 DIAGNOSIS — F419 Anxiety disorder, unspecified: Secondary | ICD-10-CM | POA: Diagnosis not present

## 2022-12-19 DIAGNOSIS — Z6835 Body mass index (BMI) 35.0-35.9, adult: Secondary | ICD-10-CM | POA: Diagnosis not present

## 2022-12-19 DIAGNOSIS — I25118 Atherosclerotic heart disease of native coronary artery with other forms of angina pectoris: Secondary | ICD-10-CM | POA: Diagnosis not present

## 2023-01-11 ENCOUNTER — Other Ambulatory Visit
Admission: RE | Admit: 2023-01-11 | Discharge: 2023-01-11 | Disposition: A | Discharge status: Home / Self Care | Payer: Medicare Other | Source: Ambulatory Visit | Attending: Internal Medicine | Admitting: Internal Medicine

## 2023-01-11 ENCOUNTER — Encounter: Payer: Self-pay | Admitting: Internal Medicine

## 2023-01-11 ENCOUNTER — Ambulatory Visit: Payer: Medicare Other | Attending: Internal Medicine | Admitting: Internal Medicine

## 2023-01-11 VITALS — BP 130/60 | HR 75 | Ht 63.0 in | Wt 188.0 lb

## 2023-01-11 DIAGNOSIS — Z79899 Other long term (current) drug therapy: Secondary | ICD-10-CM | POA: Insufficient documentation

## 2023-01-11 DIAGNOSIS — R9431 Abnormal electrocardiogram [ECG] [EKG]: Secondary | ICD-10-CM | POA: Diagnosis not present

## 2023-01-11 DIAGNOSIS — E785 Hyperlipidemia, unspecified: Secondary | ICD-10-CM | POA: Insufficient documentation

## 2023-01-11 DIAGNOSIS — I10 Essential (primary) hypertension: Secondary | ICD-10-CM | POA: Insufficient documentation

## 2023-01-11 DIAGNOSIS — I25118 Atherosclerotic heart disease of native coronary artery with other forms of angina pectoris: Secondary | ICD-10-CM

## 2023-01-11 DIAGNOSIS — I350 Nonrheumatic aortic (valve) stenosis: Secondary | ICD-10-CM | POA: Insufficient documentation

## 2023-01-11 LAB — CBC
HCT: 38.1 % (ref 36.0–46.0)
Hemoglobin: 12.4 g/dL (ref 12.0–15.0)
MCH: 31.4 pg (ref 26.0–34.0)
MCHC: 32.5 g/dL (ref 30.0–36.0)
MCV: 96.5 fL (ref 80.0–100.0)
Platelets: 267 10*3/uL (ref 150–400)
RBC: 3.95 MIL/uL (ref 3.87–5.11)
RDW: 13.2 % (ref 11.5–15.5)
WBC: 8.4 10*3/uL (ref 4.0–10.5)
nRBC: 0 % (ref 0.0–0.2)

## 2023-01-11 LAB — LIPID PANEL
Cholesterol: 193 mg/dL (ref 0–200)
HDL: 54 mg/dL (ref >40)
LDL Cholesterol: 96 mg/dL (ref 0–99)
Total CHOL/HDL Ratio: 3.6 RATIO
Triglycerides: 216 mg/dL — ABNORMAL HIGH (ref <150)
VLDL: 43 mg/dL — ABNORMAL HIGH (ref 0–40)

## 2023-01-11 LAB — COMPREHENSIVE METABOLIC PANEL
ALT: 23 U/L (ref 0–44)
AST: 24 U/L (ref 15–41)
Albumin: 4 g/dL (ref 3.5–5.0)
Alkaline Phosphatase: 48 U/L (ref 38–126)
Anion gap: 12 (ref 5–15)
BUN: 31 mg/dL — ABNORMAL HIGH (ref 8–23)
CO2: 25 mmol/L (ref 22–32)
Calcium: 9.3 mg/dL (ref 8.9–10.3)
Chloride: 103 mmol/L (ref 98–111)
Creatinine, Ser: 0.85 mg/dL (ref 0.44–1.00)
GFR, Estimated: >60 mL/min (ref >60)
Glucose, Bld: 104 mg/dL — ABNORMAL HIGH (ref 70–99)
Potassium: 3.6 mmol/L (ref 3.5–5.1)
Sodium: 140 mmol/L (ref 135–145)
Total Bilirubin: 0.8 mg/dL (ref 0.3–1.2)
Total Protein: 7.3 g/dL (ref 6.5–8.1)

## 2023-01-11 NOTE — Patient Instructions (Signed)
Medication Instructions:  Your Physician recommend you continue on your current medication as directed.     *If you need a refill on your cardiac medications before your next appointment, please call your pharmacy*   Lab Work: Your provider would like for you to have following labs drawn: (CBC, CMP, Lipid).   Please go to the [facility] entrance and check in at the front desk.  You do not need an appointment.  They are open from 7am-6 pm.     Testing/Procedures: None ordered today   Follow-Up: At [facility], you and your health needs are our priority.  As part of our continuing mission to provide you with exceptional heart care, we have created designated Provider Care Teams.  These Care Teams include your primary Cardiologist (physician) and Advanced Practice Providers (APPs -  Physician Assistants and Nurse Practitioners) who all work together to provide you with the care you need, when you need it.  We recommend signing up for the patient portal called "MyChart".  Sign up information is provided on this After Visit Summary.  MyChart is used to connect with patients for Virtual Visits (Telemedicine).  Patients are able to view lab/test results, encounter notes, upcoming appointments, etc.  Non-urgent messages can be sent to your provider as well.   To learn more about what you can do with MyChart, go to [email or web address].    Your next appointment:   6 month(s)  Provider:   You may see Yvonne Kendall, MD or one of the following Advanced Practice Providers on your designated Care Team:   Nicolasa Ducking, NP Eula Listen, PA-C Cadence Fransico Michael, PA-C Charlsie Quest, NP

## 2023-01-11 NOTE — Progress Notes (Unsigned)
Follow-up Outpatient Visit Date: 01/11/2023  Primary Care Provider: Patient, No Pcp Per No address on file  Chief Complaint: Follow-up chest pain and aortic stenosis  HPI:  Angel [name] is a 89 y.o. female with history of poor R wave progression by EKG, abnormal myocardial perfusion stress test, and question of small apical infarct versus diverticulum by cardiac MRI, as well as hypertension, hyperlipidemia, aortic stenosis with enlargement of the thoracic aorta, and left pontine stroke (11/21), who presents for follow-up of chest pain and aortic stenosis.  I last saw her in October, at which time she complained mostly of arthritic pain involving her knees and hips.  She noted a single episode of chest pain that resolved after taking SL NTG x 2.  She did not wish to purse further ischemia evaluation at that time.  Today, Angel [name] reports that she feels about the same as at our last visit.  She continues to have sporadic chest pain that seems to be brought on by emotional stress, particularly when dealing with one of her sons.  She describes it as a pressure-like sensation in the center of her chest without associated symptoms that usually lasts 15 to 20 minutes.  She does not have any exertional chest pain.  She has not taken any nitroglycerin for it.  She also has a prescription for alprazolam to help with anxiety, though she is reluctant to use this out of concern that she could become dependent upon it.  She denies palpitations, lightheadedness, and edema as well as significant dyspnea though she notes that she "paces herself" in order to avoid getting into a position of being out of breath.  Her primary limiting factor in regard to her mobility remains lower extremity arthritis.  --------------------------------------------------------------------------------------------------  Past Medical History:  Diagnosis Date   Acute CVA (cerebrovascular accident) (HCC) 06/28/2020   Anxiety    Aortic  aneurysm without rupture (HCC)    Aortic stenosis    mild-moderate by echo   CAD (coronary artery disease)    Chronic hip pain    Endometriosis    Hearing loss    HTN, goal below 150/90    Hyperlipidemia LDL goal <70 11/18/2014   Osteopenia of the elderly    Urge and stress incontinence    Past Surgical History:  Procedure Laterality Date   ABDOMINAL HYSTERECTOMY  1976   total   APPENDECTOMY     BREAST EXCISIONAL BIOPSY Left 1987   benign   CATARACT EXTRACTION, BILATERAL  08/2018   Dr. Sherryll Burger with [facility]    Current Meds  Medication Sig   amLODipine (NORVASC) 5 MG tablet TAKE 1 TABLET (5 MG TOTAL) BY MOUTH DAILY.   Ascorbic Acid (VITAMIN C) 100 MG tablet Take 500 mg by mouth daily. Gummy 750mg    aspirin EC 81 MG EC tablet Take 1 tablet (81 mg total) by mouth daily. Swallow whole.   atorvastatin (LIPITOR) 80 MG tablet TAKE 1 TABLET BY MOUTH EVERY DAY   budesonide (RHINOCORT AQUA) 32 MCG/ACT nasal spray as needed.   busPIRone (BUSPAR) 5 MG tablet Take 1 tablet (5 mg total) by mouth 2 (two) times daily.   chlorthalidone (HYGROTON) 25 MG tablet TAKE 1/2 TABLET BY MOUTH EVERY DAY   lisinopril (ZESTRIL) 40 MG tablet TAKE 1 TABLET BY MOUTH EVERY DAY   loratadine (CLARITIN) 10 MG tablet Take 10 mg by mouth daily as needed for allergies.   metoprolol succinate (TOPROL-XL) 100 MG 24 hr tablet TAKE 1 TABLET BY  MOUTH EVERY DAY WITH OR IMMEDIATELY FOLLOWING A MEAL   Multiple Vitamin (MULTIVITAMIN) capsule Take 1 capsule by mouth daily.   nitroGLYCERIN (NITROSTAT) 0.4 MG SL tablet Place 1 tablet (0.4 mg total) under the tongue every 5 (five) minutes as needed for chest pain.   Omega-3 Fatty Acids (FISH OIL) 1000 MG CAPS Take by mouth daily. Krill oil   vitamin B-12 (CYANOCOBALAMIN) 100 MCG tablet Take 1,000 mcg by mouth daily.    VITAMIN D PO Take 1 tablet by mouth daily.    Allergies: Codeine, Levofloxacin, Myrbetriq [mirabegron], Prednisone, and Sulfa antibiotics  Social  History   Tobacco Use   Smoking status: Never   Smokeless tobacco: Never  Vaping Use   Vaping Use: Never used  Substance Use Topics   Alcohol use: Yes    Alcohol/week: 1.0 standard drink of alcohol    Types: 1 Cans of beer per week    Comment: once a month   Drug use: No    Family History  Problem Relation Age of Onset   Stroke Mother    Cancer Brother        skin   Breast cancer Maternal Grandmother 60   Heart disease Maternal Grandmother     Review of Systems: A 12-system review of systems was performed and was negative except as noted in the HPI.  --------------------------------------------------------------------------------------------------  Physical Exam: BP 130/60 (BP Location: Left Arm, Patient Position: Sitting, Cuff Size: Large)   Pulse 75   Ht 5\' 3"  (1.6 m)   Wt 188 lb (85.3 kg)   LMP  (LMP Unknown)   SpO2 97%   BMI 33.30 kg/m   General:  NAD. Neck: No JVD or HJR. Lungs: Clear to auscultation bilaterally without wheezes or crackles. Heart: Regular rate and rhythm with 3/6 systolic murmur. Abdomen: Soft, nontender, nondistended. Extremities: Trace ankle edema bilaterally.  EKG: Normal sinus rhythm with borderline LVH and poor R wave progression.  No significant change since 05/20/2022.  Lab Results  Component Value Date   WBC 8.8 09/01/2021   HGB 13.1 09/01/2021   HCT 40.3 09/01/2021   MCV 96.2 09/01/2021   PLT 285 09/01/2021    Lab Results  Component Value Date   NA 139 11/10/2021   K 3.2 (L) 11/10/2021   CL 106 11/10/2021   CO2 23 11/10/2021   BUN 35 (H) 11/10/2021   CREATININE 0.93 11/10/2021   GLUCOSE 214 (H) 11/10/2021   ALT 25 11/10/2021    Lab Results  Component Value Date   CHOL 169 11/10/2021   HDL 47 11/10/2021   LDLCALC 79 11/10/2021   TRIG 215 (H) 11/10/2021   CHOLHDL 3.6 11/10/2021    --------------------------------------------------------------------------------------------------  ASSESSMENT AND PLAN: Stable  angina and abnormal EKG: Overall, Angel [name] has stable chest pain primarily related to emotional stress.  Chronic poor R wave progression dating back years is similar to prior EKGs.  We discussed repeat ischemia evaluation, though given her age and stable symptoms, Angel [name] wishes to defer at this.  We will continue aspirin and atorvastatin for secondary prevention as well as antianginal therapy with amlodipine and metoprolol succinate.  Aortic stenosis: Most recent echocardiogram in 05/2022 showed mild-moderate aortic valve stenosis with mean gradient of 12 mmHg and valve area of 1.1 cm.  I do not think that aortic stenosis is driving her emotional stress-induced chest pain.  We will continue clinical follow-up and plans for repeat echocardiogram in 1 to 2 years (sooner if symptoms progress).  Hypertension: Systolic  blood pressure upper normal today.  We will continue current regimen of amlodipine, lisinopril, metoprolol succinate, and chlorthalidone.  Hyperlipidemia: Last lipid panel over a year ago showed reasonable LDL of 79 and mild hypertriglyceridemia at 215.  Will plan to continue atorvastatin 80 mg daily and fish oil.  Check lipid panel, CMP, and CBC today.  Follow-up: Return to clinic in 6 months.  Yvonne Kendall, MD 01/11/2023 10:41 AM

## 2023-01-12 ENCOUNTER — Encounter: Payer: Self-pay | Admitting: Internal Medicine

## 2023-01-12 DIAGNOSIS — R9431 Abnormal electrocardiogram [ECG] [EKG]: Secondary | ICD-10-CM | POA: Insufficient documentation

## 2023-01-19 ENCOUNTER — Other Ambulatory Visit: Payer: Self-pay | Admitting: *Deleted

## 2023-01-19 DIAGNOSIS — M9903 Segmental and somatic dysfunction of lumbar region: Secondary | ICD-10-CM | POA: Diagnosis not present

## 2023-01-19 DIAGNOSIS — M9902 Segmental and somatic dysfunction of thoracic region: Secondary | ICD-10-CM | POA: Diagnosis not present

## 2023-01-19 DIAGNOSIS — M9905 Segmental and somatic dysfunction of pelvic region: Secondary | ICD-10-CM | POA: Diagnosis not present

## 2023-01-19 DIAGNOSIS — M5136 Other intervertebral disc degeneration, lumbar region: Secondary | ICD-10-CM | POA: Diagnosis not present

## 2023-01-19 MED ORDER — AMLODIPINE BESYLATE 5 MG PO TABS: 5.0000 mg | ORAL_TABLET | Freq: Every day | ORAL | 2 refills | Status: DC

## 2023-01-26 DIAGNOSIS — H35363 Drusen (degenerative) of macula, bilateral: Secondary | ICD-10-CM | POA: Diagnosis not present

## 2023-02-19 ENCOUNTER — Other Ambulatory Visit: Payer: Self-pay | Admitting: Internal Medicine

## 2023-02-19 DIAGNOSIS — E785 Hyperlipidemia, unspecified: Secondary | ICD-10-CM

## 2023-02-20 DIAGNOSIS — M5136 Other intervertebral disc degeneration, lumbar region: Secondary | ICD-10-CM | POA: Diagnosis not present

## 2023-02-20 DIAGNOSIS — M9905 Segmental and somatic dysfunction of pelvic region: Secondary | ICD-10-CM | POA: Diagnosis not present

## 2023-02-20 DIAGNOSIS — M9903 Segmental and somatic dysfunction of lumbar region: Secondary | ICD-10-CM | POA: Diagnosis not present

## 2023-02-20 DIAGNOSIS — M9902 Segmental and somatic dysfunction of thoracic region: Secondary | ICD-10-CM | POA: Diagnosis not present

## 2023-02-22 ENCOUNTER — Ambulatory Visit: Payer: Medicare Other | Admitting: Family Medicine

## 2023-02-27 DIAGNOSIS — L821 Other seborrheic keratosis: Secondary | ICD-10-CM | POA: Diagnosis not present

## 2023-02-27 DIAGNOSIS — L918 Other hypertrophic disorders of the skin: Secondary | ICD-10-CM | POA: Diagnosis not present

## 2023-02-27 DIAGNOSIS — Z08 Encounter for follow-up examination after completed treatment for malignant neoplasm: Secondary | ICD-10-CM | POA: Diagnosis not present

## 2023-02-27 DIAGNOSIS — D2262 Melanocytic nevi of left upper limb, including shoulder: Secondary | ICD-10-CM | POA: Diagnosis not present

## 2023-02-27 DIAGNOSIS — D225 Melanocytic nevi of trunk: Secondary | ICD-10-CM | POA: Diagnosis not present

## 2023-02-27 DIAGNOSIS — L237 Allergic contact dermatitis due to plants, except food: Secondary | ICD-10-CM | POA: Diagnosis not present

## 2023-02-27 DIAGNOSIS — D2261 Melanocytic nevi of right upper limb, including shoulder: Secondary | ICD-10-CM | POA: Diagnosis not present

## 2023-02-27 DIAGNOSIS — Z85828 Personal history of other malignant neoplasm of skin: Secondary | ICD-10-CM | POA: Diagnosis not present

## 2023-03-11 ENCOUNTER — Other Ambulatory Visit: Payer: Self-pay | Admitting: Internal Medicine

## 2023-03-23 DIAGNOSIS — M9905 Segmental and somatic dysfunction of pelvic region: Secondary | ICD-10-CM | POA: Diagnosis not present

## 2023-03-23 DIAGNOSIS — M9902 Segmental and somatic dysfunction of thoracic region: Secondary | ICD-10-CM | POA: Diagnosis not present

## 2023-03-23 DIAGNOSIS — M5136 Other intervertebral disc degeneration, lumbar region: Secondary | ICD-10-CM | POA: Diagnosis not present

## 2023-03-23 DIAGNOSIS — M9903 Segmental and somatic dysfunction of lumbar region: Secondary | ICD-10-CM | POA: Diagnosis not present

## 2023-03-28 ENCOUNTER — Ambulatory Visit: Payer: Medicare Other | Admitting: Nurse Practitioner

## 2023-04-04 DIAGNOSIS — Z23 Encounter for immunization: Secondary | ICD-10-CM | POA: Diagnosis not present

## 2023-04-19 ENCOUNTER — Encounter: Payer: Self-pay | Admitting: Family Medicine

## 2023-04-19 ENCOUNTER — Ambulatory Visit (INDEPENDENT_AMBULATORY_CARE_PROVIDER_SITE_OTHER): Payer: Medicare Other | Admitting: Family Medicine

## 2023-04-19 VITALS — BP 124/78 | HR 79 | Temp 97.6°F | Ht 63.0 in | Wt 192.0 lb

## 2023-04-19 DIAGNOSIS — M65341 Trigger finger, right ring finger: Secondary | ICD-10-CM | POA: Diagnosis not present

## 2023-04-19 DIAGNOSIS — Z7189 Other specified counseling: Secondary | ICD-10-CM

## 2023-04-19 DIAGNOSIS — I25118 Atherosclerotic heart disease of native coronary artery with other forms of angina pectoris: Secondary | ICD-10-CM | POA: Diagnosis not present

## 2023-04-19 DIAGNOSIS — Z8673 Personal history of transient ischemic attack (TIA), and cerebral infarction without residual deficits: Secondary | ICD-10-CM

## 2023-04-19 DIAGNOSIS — F418 Other specified anxiety disorders: Secondary | ICD-10-CM

## 2023-04-19 DIAGNOSIS — M170 Bilateral primary osteoarthritis of knee: Secondary | ICD-10-CM

## 2023-04-19 DIAGNOSIS — I10 Essential (primary) hypertension: Secondary | ICD-10-CM

## 2023-04-19 MED ORDER — ALPRAZOLAM 0.25 MG PO TABS
0.2500 mg | ORAL_TABLET | Freq: Two times a day (BID) | ORAL | 0 refills | Status: DC | PRN
Start: 2023-04-19 — End: 2023-05-04

## 2023-04-19 MED ORDER — BUSPIRONE HCL 7.5 MG PO TABS
7.5000 mg | ORAL_TABLET | Freq: Two times a day (BID) | ORAL | 1 refills | Status: DC
Start: 2023-04-19 — End: 2023-05-24

## 2023-04-19 NOTE — Progress Notes (Signed)
Subjective:     Patient ID: Angel [name], female    DOB: 28-Jan-1931, 89 y.o.   MRN: [id number]  Chief Complaint  Patient presents with   Establish Care    Would like to discuss buspar and trigger finger right hand index finger    HPI  Discussed the use of AI scribe software for clinical note transcription with the patient, who gave verbal consent to proceed.  History of Present Illness         She is here to re-establish care. She was my patient at my previous practice and I last saw her in 2022.   She went to a facility called [facility] for [facility].   Other providers: Cardiologist-  Dr. Cristal Deer [facility]- Dr. Alberteen Spindle  Chiropractor- Josefina Do  Optometrist- Fredrich Birks  ENT- Dr. Vernie Murders  Sports medicine- Dr. Norton Blizzard   Followed by cardiology for aortic stenosis with enlargement of thoracic aorta, HTN, abnormal myocardial perfusion stress test. Hx of left pontine stroke in 2021.   Here today with concerns about better anxiety management.   She also c/o finger on right hand getting stuck. Wearing a splint today.      04/19/2023    8:29 AM 09/14/2021   11:47 AM 03/09/2021   11:39 AM 12/04/2020    8:28 AM 11/12/2020   10:32 AM  Depression screen PHQ 2/9  Decreased Interest 0 1 0 0 0  Down, Depressed, Hopeless 0 1 0 0 0  PHQ - 2 Score 0 2 0 0 0  Altered sleeping  0 0    Tired, decreased energy  1 0    Change in appetite  0 0    Feeling bad or failure about yourself   0 0    Trouble concentrating  0 0    Moving slowly or fidgety/restless  0 0    Suicidal thoughts  0 0    PHQ-9 Score  3 0    Difficult doing work/chores  Not difficult at all Not difficult at all       Health Maintenance Due  Topic Date Due   Pneumonia Vaccine 18+ Years old (1 of 1 - PCV) Never done   Zoster Vaccines- Shingrix (2 of 2) 03/22/2018   Medicare Annual Wellness (AWV)  10/13/2021    Past Medical History:  Diagnosis Date   Acute CVA (cerebrovascular accident) (HCC)  06/28/2020   Anxiety    Aortic aneurysm without rupture (HCC)    Aortic stenosis    mild-moderate by echo   Arthritis    CAD (coronary artery disease)    Chronic hip pain    Endometriosis    Hearing loss    HTN, goal below 150/90    Hyperlipidemia LDL goal <70 11/18/2014   Osteopenia of the elderly    Urge and stress incontinence     Past Surgical History:  Procedure Laterality Date   ABDOMINAL HYSTERECTOMY  1976   total   APPENDECTOMY     BREAST EXCISIONAL BIOPSY Left 1987   benign   CATARACT EXTRACTION, BILATERAL  08/2018   Dr. Sherryll Burger with [facility]    Family History  Problem Relation Age of Onset   Stroke Mother    Cancer Brother        skin   Breast cancer Maternal Grandmother 60   Heart disease Maternal Grandmother     Social History   Socioeconomic History   Marital status: Significant Other    Spouse  name: Not on file   Number of children: 3   Years of education: Not on file   Highest education level: Not on file  Occupational History   Not on file  Tobacco Use   Smoking status: Never   Smokeless tobacco: Never  Vaping Use   Vaping status: Never Used  Substance and Sexual Activity   Alcohol use: Yes    Alcohol/week: 1.0 standard drink of alcohol    Types: 1 Cans of beer per week    Comment: once a month   Drug use: No   Sexual activity: Not on file  Other Topics Concern   Not on file  Social History Narrative   Not on file   Social Determinants of Health   Financial Resource Strain: Low Risk  (08/13/2019)   Overall Financial Resource Strain (CARDIA)    Difficulty of Paying Living Expenses: Not hard at all  Food Insecurity: No Food Insecurity (08/13/2019)   Hunger Vital Sign    Worried About Running Out of Food in the Last Year: Never true    Ran Out of Food in the Last Year: Never true  Transportation Needs: No Transportation Needs (08/13/2019)   PRAPARE - Administrator, Civil Service (Medical): No    Lack of  Transportation (Non-Medical): No  Physical Activity: Insufficiently Active (08/13/2019)   Exercise Vital Sign    Days of Exercise per Week: 7 days    Minutes of Exercise per Session: 20 min  Stress: No Stress Concern Present (08/13/2019)   [employer] of Occupational Health - Occupational Stress Questionnaire    Feeling of Stress : Only a little  Social Connections: Unknown (08/13/2019)   Social Connection and Isolation Panel [NHANES]    Frequency of Communication with Friends and Family: Patient declined    Frequency of Social Gatherings with Friends and Family: Patient declined    Attends Religious Services: Patient declined    Database administrator or Organizations: Patient declined    Attends Banker Meetings: Patient declined    Marital Status: Widowed  Intimate Partner Violence: Not At Risk (08/13/2019)   Humiliation, Afraid, Rape, and Kick questionnaire    Fear of Current or Ex-Partner: No    Emotionally Abused: No    Physically Abused: No    Sexually Abused: No    Outpatient Medications Prior to Visit  Medication Sig Dispense Refill   amLODipine (NORVASC) 5 MG tablet Take 1 tablet (5 mg total) by mouth daily. 90 tablet 2   Ascorbic Acid (VITAMIN C) 100 MG tablet Take 500 mg by mouth daily. Gummy 750mg      aspirin EC 81 MG EC tablet Take 1 tablet (81 mg total) by mouth daily. Swallow whole. 30 tablet 11   atorvastatin (LIPITOR) 80 MG tablet TAKE 1 TABLET BY MOUTH EVERY DAY 90 tablet 0   budesonide (RHINOCORT AQUA) 32 MCG/ACT nasal spray as needed.     chlorthalidone (HYGROTON) 25 MG tablet TAKE 1/2 TABLET BY MOUTH EVERY DAY 45 tablet 3   clobetasol cream (TEMOVATE) 0.05 % Apply topically 2 (two) times daily.     lisinopril (ZESTRIL) 40 MG tablet TAKE 1 TABLET BY MOUTH EVERY DAY 90 tablet 0   loratadine (CLARITIN) 10 MG tablet Take 10 mg by mouth daily as needed for allergies.     metoprolol succinate (TOPROL-XL) 100 MG 24 hr tablet TAKE 1 TABLET BY MOUTH EVERY  DAY WITH OR IMMEDIATELY FOLLOWING A MEAL 90 tablet 3  Multiple Vitamin (MULTIVITAMIN) capsule Take 1 capsule by mouth daily.     nitroGLYCERIN (NITROSTAT) 0.4 MG SL tablet Place 1 tablet (0.4 mg total) under the tongue every 5 (five) minutes as needed for chest pain. 25 tablet 3   Omega-3 Fatty Acids (FISH OIL) 1000 MG CAPS Take by mouth daily. Krill oil     triamcinolone cream (KENALOG) 0.1 % Apply topically.     vitamin B-12 (CYANOCOBALAMIN) 100 MCG tablet Take 1,000 mcg by mouth daily.      VITAMIN D PO Take 1 tablet by mouth daily.     busPIRone (BUSPAR) 5 MG tablet Take 1 tablet (5 mg total) by mouth 2 (two) times daily. 60 tablet 2   ALPRAZolam (XANAX) 0.25 MG tablet Take 0.25 mg by mouth as needed. (Patient not taking: Reported on 04/19/2023)     No facility-administered medications prior to visit.    Allergies  Allergen Reactions   Codeine Other (See Comments)    "zoned out"   Levofloxacin Other (See Comments)    AMS   Myrbetriq [Mirabegron] Other (See Comments)    Severe depression   Prednisone Other (See Comments)    AMS   Sulfa Antibiotics Other (See Comments)    AMS    Review of Systems  Constitutional:  Negative for chills and fever.  Respiratory:  Negative for shortness of breath.   Cardiovascular:  Negative for chest pain, palpitations and leg swelling.  Gastrointestinal:  Negative for abdominal pain, constipation, diarrhea, nausea and vomiting.  Genitourinary:  Negative for dysuria, frequency and urgency.  Musculoskeletal:  Positive for joint pain.  Neurological:  Negative for dizziness, focal weakness and headaches.  Psychiatric/Behavioral:  Negative for depression. The patient is nervous/anxious.        Objective:    Physical Exam Constitutional:      General: She is not in acute distress.    Appearance: She is not ill-appearing.  HENT:     Mouth/Throat:     Pharynx: Oropharynx is clear.  Eyes:     Extraocular Movements: Extraocular movements intact.      Conjunctiva/sclera: Conjunctivae normal.  Cardiovascular:     Rate and Rhythm: Normal rate.  Pulmonary:     Effort: Pulmonary effort is normal.  Musculoskeletal:     Right wrist: Normal.     Left wrist: Normal.     Right hand: Normal.     Left hand: Normal. No tenderness. Normal sensation. Normal capillary refill. Normal pulse.     Cervical back: Normal range of motion and neck supple.  Skin:    General: Skin is warm and dry.  Neurological:     General: No focal deficit present.     Mental Status: She is alert and oriented to person, place, and time.     Sensory: No sensory deficit.     Coordination: Coordination normal.  Psychiatric:        Mood and Affect: Mood normal.        Behavior: Behavior normal.        Thought Content: Thought content normal.      BP 124/78 (BP Location: Left Arm, Patient Position: Sitting, Cuff Size: Large)   Pulse 79   Temp 97.6 F (36.4 C) (Temporal)   Ht 5\' 3"  (1.6 m)   Wt 192 lb (87.1 kg)   LMP  (LMP Unknown)   SpO2 96%   BMI 34.01 kg/m  Wt Readings from Last 3 Encounters:  04/19/23 192 lb (87.1 kg)  01/11/23 188  lb (85.3 kg)  05/04/22 191 lb (86.6 kg)       Assessment & Plan:   Problem List Items Addressed This Visit       Cardiovascular and Mediastinum   CAD (coronary artery disease) (Chronic)   Essential hypertension     Musculoskeletal and Integument   Arthritis of knee, degenerative     Other   History of CVA (cerebrovascular accident)   Situational anxiety - Primary   Relevant Medications   ALPRAZolam (XANAX) 0.25 MG tablet   busPIRone (BUSPAR) 7.5 MG tablet   Other Visit Diagnoses     Trigger ring finger of right hand       Relevant Orders   Ambulatory referral to Hand Surgery   Counseling regarding advance care planning and goals of care          She is a pleasant 89 year old female who was my patient at my previous practice and here to re-establish care today.  Her friend Lorin Picket is with her. She is in  good spirits but concerns regarding better anxiety management. She has alprazolam at home but rarely takes it. Advised to take it prn for severe anxiety. Increase Buspar dose to 7.5 mg bid. Discussed finding a therapist.  Counseling on advance directives. She will think about what she wants and look for documents at home. Her daughter in law is her health care POC. Referral to hand specialist for symptoms consistent with trigger finger.  Follow up here or virtually in 4 wks   I have discontinued Jonica Phillip. Kiesel's busPIRone. I have also changed her ALPRAZolam. Additionally, I am having her start on busPIRone. Lastly, I am having her maintain her vitamin B-12, vitamin C, multivitamin, Fish Oil, nitroGLYCERIN, aspirin EC, chlorthalidone, VITAMIN D PO, budesonide, loratadine, metoprolol succinate, amLODipine, atorvastatin, lisinopril, triamcinolone cream, and clobetasol cream.  Meds ordered this encounter  Medications   ALPRAZolam (XANAX) 0.25 MG tablet    Sig: Take 1 tablet (0.25 mg total) by mouth 2 (two) times daily as needed.    Dispense:  30 tablet    Refill:  0    Order Specific Question:   Supervising Provider    Answer:   Hillard Danker A [[id number]]   busPIRone (BUSPAR) 7.5 MG tablet    Sig: Take 1 tablet (7.5 mg total) by mouth 2 (two) times daily.    Dispense:  60 tablet    Refill:  1    Order Specific Question:   Supervising Provider    Answer:   Hillard Danker A [[id number]]

## 2023-04-19 NOTE — Patient Instructions (Addendum)
I have increased your buspirone dose.   Take the Xanax 0.25 mg tablet once or twice daily as needed.   Consider talking with a therapist.   Check on your advance directive paperwork. Talk to your health care power of attorney to make sure you two are on the same page. We can have this discussion again in 4 weeks.   You will hear from the hand specialist to schedule.

## 2023-04-20 DIAGNOSIS — M5136 Other intervertebral disc degeneration, lumbar region: Secondary | ICD-10-CM | POA: Diagnosis not present

## 2023-04-20 DIAGNOSIS — M9903 Segmental and somatic dysfunction of lumbar region: Secondary | ICD-10-CM | POA: Diagnosis not present

## 2023-04-20 DIAGNOSIS — M9902 Segmental and somatic dysfunction of thoracic region: Secondary | ICD-10-CM | POA: Diagnosis not present

## 2023-04-20 DIAGNOSIS — M9905 Segmental and somatic dysfunction of pelvic region: Secondary | ICD-10-CM | POA: Diagnosis not present

## 2023-05-01 ENCOUNTER — Encounter: Payer: Self-pay | Admitting: Family Medicine

## 2023-05-03 ENCOUNTER — Other Ambulatory Visit: Payer: Self-pay | Admitting: Family Medicine

## 2023-05-03 DIAGNOSIS — F418 Other specified anxiety disorders: Secondary | ICD-10-CM

## 2023-05-03 NOTE — Telephone Encounter (Signed)
LOV: 04/19/23 Last fill: 04/19/23, 30 tablets 0 refill

## 2023-05-04 NOTE — Telephone Encounter (Signed)
Called pt and she reports she misplaced her bottle, she thought she put it in her purse and has been trying to find it and has been looking all over but was unable to find it. She apologizes for this inconvenience and is wondering if you could send her in a new supply?

## 2023-05-12 DIAGNOSIS — M65342 Trigger finger, left ring finger: Secondary | ICD-10-CM | POA: Diagnosis not present

## 2023-05-12 DIAGNOSIS — M65341 Trigger finger, right ring finger: Secondary | ICD-10-CM | POA: Diagnosis not present

## 2023-05-13 ENCOUNTER — Other Ambulatory Visit: Payer: Self-pay | Admitting: Family Medicine

## 2023-05-13 DIAGNOSIS — F418 Other specified anxiety disorders: Secondary | ICD-10-CM

## 2023-05-17 ENCOUNTER — Ambulatory Visit: Payer: Medicare Other | Admitting: Family Medicine

## 2023-05-18 ENCOUNTER — Ambulatory Visit: Payer: Medicare Other | Admitting: Family Medicine

## 2023-05-19 ENCOUNTER — Ambulatory Visit: Payer: Medicare Other | Admitting: Family Medicine

## 2023-05-21 ENCOUNTER — Other Ambulatory Visit: Payer: Self-pay | Admitting: Internal Medicine

## 2023-05-21 DIAGNOSIS — E785 Hyperlipidemia, unspecified: Secondary | ICD-10-CM

## 2023-05-22 DIAGNOSIS — M9905 Segmental and somatic dysfunction of pelvic region: Secondary | ICD-10-CM | POA: Diagnosis not present

## 2023-05-22 DIAGNOSIS — M5136 Other intervertebral disc degeneration, lumbar region with discogenic back pain only: Secondary | ICD-10-CM | POA: Diagnosis not present

## 2023-05-22 DIAGNOSIS — M9903 Segmental and somatic dysfunction of lumbar region: Secondary | ICD-10-CM | POA: Diagnosis not present

## 2023-05-22 DIAGNOSIS — M9902 Segmental and somatic dysfunction of thoracic region: Secondary | ICD-10-CM | POA: Diagnosis not present

## 2023-05-24 ENCOUNTER — Encounter: Payer: Self-pay | Admitting: Family Medicine

## 2023-05-24 ENCOUNTER — Ambulatory Visit: Payer: Medicare Other | Admitting: Family Medicine

## 2023-05-24 VITALS — BP 114/72 | HR 85 | Temp 97.8°F | Ht 63.0 in | Wt 192.0 lb

## 2023-05-24 DIAGNOSIS — F418 Other specified anxiety disorders: Secondary | ICD-10-CM

## 2023-05-24 DIAGNOSIS — Z23 Encounter for immunization: Secondary | ICD-10-CM | POA: Diagnosis not present

## 2023-05-24 DIAGNOSIS — M1990 Unspecified osteoarthritis, unspecified site: Secondary | ICD-10-CM

## 2023-05-24 DIAGNOSIS — Z6834 Body mass index (BMI) 34.0-34.9, adult: Secondary | ICD-10-CM

## 2023-05-24 DIAGNOSIS — E66811 Obesity, class 1: Secondary | ICD-10-CM | POA: Diagnosis not present

## 2023-05-24 DIAGNOSIS — M170 Bilateral primary osteoarthritis of knee: Secondary | ICD-10-CM | POA: Diagnosis not present

## 2023-05-24 MED ORDER — BUSPIRONE HCL 10 MG PO TABS
10.0000 mg | ORAL_TABLET | Freq: Two times a day (BID) | ORAL | 2 refills | Status: DC
Start: 2023-05-24 — End: 2023-06-15

## 2023-05-24 NOTE — Patient Instructions (Signed)
I have increased your buspirone dose.   In regards to pain, try topical analgesic first, such as Salon Pas with lidocaine (rub, roll on or patch) or Voltaren gel up to 4 times daily.   You may also do heat or ice.   Tylenol 500 mg or 1,000 mg twice daily.   Meloxicam daily.   Let me know how you are doing in 4 weeks. We can do a virtual visit or in office, whichever you prefer.

## 2023-05-24 NOTE — Progress Notes (Unsigned)
Subjective:     Patient ID: Angel [name], female    DOB: 08/26/1930, 89 y.o.   MRN: [id number]  Chief Complaint  Patient presents with   Medical Management of Chronic Issues    4 week f/u    HPI  History of Present Illness         She is here for a 4 wk follow up.  She is having a lot of pain.  C/o arthritis pain in several joints.   Bilateral feet, low back, hands  Sees a chiropractor monthly - Tim Beshel   Hand specialist - Dr. Hyacinth Meeker in [place]   Recently had 2 cortisone injections for trigger fingers. Slight improvement.    Anxiety - alprazolam makes her very drowsy. She does not feel that Buspar is doing that much for her.      Health Maintenance Due  Topic Date Due   Pneumonia Vaccine 52+ Years old (1 of 1 - PCV) Never done   Medicare Annual Wellness (AWV)  10/13/2021    Past Medical History:  Diagnosis Date   Acute CVA (cerebrovascular accident) (HCC) 06/28/2020   Anxiety    Aortic aneurysm without rupture (HCC)    Aortic stenosis    mild-moderate by echo   Arthritis    CAD (coronary artery disease)    Chronic hip pain    Endometriosis    Hearing loss    HTN, goal below 150/90    Hyperlipidemia LDL goal <70 11/18/2014   Osteopenia of the elderly    Urge and stress incontinence     Past Surgical History:  Procedure Laterality Date   ABDOMINAL HYSTERECTOMY  1976   total   APPENDECTOMY     BREAST EXCISIONAL BIOPSY Left 1987   benign   CATARACT EXTRACTION, BILATERAL  08/2018   Dr. Sherryll Burger with [facility]    Family History  Problem Relation Age of Onset   Stroke Mother    Cancer Brother        skin   Breast cancer Maternal Grandmother 60   Heart disease Maternal Grandmother     Social History   Socioeconomic History   Marital status: Significant Other    Spouse name: Not on file   Number of children: 3   Years of education: Not on file   Highest education level: Master's degree (e.g., MA, MS, MEng, MEd, MSW, MBA)   Occupational History   Not on file  Tobacco Use   Smoking status: Never   Smokeless tobacco: Never  Vaping Use   Vaping status: Never Used  Substance and Sexual Activity   Alcohol use: Yes    Alcohol/week: 1.0 standard drink of alcohol    Types: 1 Cans of beer per week    Comment: once a month   Drug use: No   Sexual activity: Not on file  Other Topics Concern   Not on file  Social History Narrative   Not on file   Social Determinants of Health   Financial Resource Strain: Low Risk  (05/23/2023)   Overall Financial Resource Strain (CARDIA)    Difficulty of Paying Living Expenses: Not hard at all  Food Insecurity: No Food Insecurity (05/23/2023)   Hunger Vital Sign    Worried About Running Out of Food in the Last Year: Never true    Ran Out of Food in the Last Year: Never true  Transportation Needs: No Transportation Needs (05/23/2023)   PRAPARE - Transportation    Lack of Transportation (  Medical): No    Lack of Transportation (Non-Medical): No  Physical Activity: Unknown (05/23/2023)   Exercise Vital Sign    Days of Exercise per Week: 0 days    Minutes of Exercise per Session: Not on file  Stress: No Stress Concern Present (05/23/2023)   [employer] of Occupational Health - Occupational Stress Questionnaire    Feeling of Stress : Not at all  Social Connections: Moderately Integrated (05/23/2023)   Social Connection and Isolation Panel [NHANES]    Frequency of Communication with Friends and Family: More than three times a week    Frequency of Social Gatherings with Friends and Family: More than three times a week    Attends Religious Services: More than 4 times per year    Active Member of [employer] or Organizations: Yes    Attends Banker Meetings: More than 4 times per year    Marital Status: Widowed  Intimate Partner Violence: Not At Risk (08/13/2019)   Humiliation, Afraid, Rape, and Kick questionnaire    Fear of Current or Ex-Partner: No     Emotionally Abused: No    Physically Abused: No    Sexually Abused: No    Outpatient Medications Prior to Visit  Medication Sig Dispense Refill   ALPRAZolam (XANAX) 0.25 MG tablet TAKE 1 TABLET BY MOUTH 2 TIMES DAILY AS NEEDED. 30 tablet 0   amLODipine (NORVASC) 5 MG tablet Take 1 tablet (5 mg total) by mouth daily. 90 tablet 2   Ascorbic Acid (VITAMIN C) 100 MG tablet Take 500 mg by mouth daily. Gummy 750mg      aspirin EC 81 MG EC tablet Take 1 tablet (81 mg total) by mouth daily. Swallow whole. 30 tablet 11   atorvastatin (LIPITOR) 80 MG tablet TAKE 1 TABLET BY MOUTH EVERY DAY 90 tablet 0   budesonide (RHINOCORT AQUA) 32 MCG/ACT nasal spray as needed.     chlorthalidone (HYGROTON) 25 MG tablet TAKE 1/2 TABLET BY MOUTH EVERY DAY 45 tablet 3   clobetasol cream (TEMOVATE) 0.05 % Apply topically 2 (two) times daily.     lisinopril (ZESTRIL) 40 MG tablet TAKE 1 TABLET BY MOUTH EVERY DAY 90 tablet 0   loratadine (CLARITIN) 10 MG tablet Take 10 mg by mouth daily as needed for allergies.     metoprolol succinate (TOPROL-XL) 100 MG 24 hr tablet TAKE 1 TABLET BY MOUTH EVERY DAY WITH OR IMMEDIATELY FOLLOWING A MEAL 90 tablet 3   Multiple Vitamin (MULTIVITAMIN) capsule Take 1 capsule by mouth daily.     nitroGLYCERIN (NITROSTAT) 0.4 MG SL tablet Place 1 tablet (0.4 mg total) under the tongue every 5 (five) minutes as needed for chest pain. 25 tablet 3   Omega-3 Fatty Acids (FISH OIL) 1000 MG CAPS Take by mouth daily. Krill oil     triamcinolone cream (KENALOG) 0.1 % Apply topically.     vitamin B-12 (CYANOCOBALAMIN) 100 MCG tablet Take 1,000 mcg by mouth daily.      VITAMIN D PO Take 1 tablet by mouth daily.     busPIRone (BUSPAR) 7.5 MG tablet Take 1 tablet (7.5 mg total) by mouth 2 (two) times daily. 60 tablet 1   meloxicam (MOBIC) 7.5 MG tablet Take 7.5 mg by mouth daily. (Patient not taking: Reported on 05/24/2023)     No facility-administered medications prior to visit.    Allergies   Allergen Reactions   Codeine Other (See Comments)    "zoned out"   Levofloxacin Other (See Comments)  AMS   Myrbetriq [Mirabegron] Other (See Comments)    Severe depression   Prednisone Other (See Comments)    AMS   Sulfa Antibiotics Other (See Comments)    AMS    Review of Systems  Constitutional:  Negative for chills and fever.  Respiratory:  Negative for shortness of breath.   Cardiovascular:  Negative for chest pain, palpitations and leg swelling.  Gastrointestinal:  Negative for abdominal pain, constipation, diarrhea, nausea and vomiting.  Genitourinary:  Negative for dysuria, frequency and urgency.  Musculoskeletal:  Positive for joint pain.  Neurological:  Negative for dizziness and focal weakness.  Psychiatric/Behavioral:  The patient is nervous/anxious.        Objective:    Physical Exam Constitutional:      General: She is not in acute distress.    Appearance: She is not ill-appearing.  Eyes:     Extraocular Movements: Extraocular movements intact.     Conjunctiva/sclera: Conjunctivae normal.  Cardiovascular:     Rate and Rhythm: Normal rate.  Pulmonary:     Effort: Pulmonary effort is normal.  Musculoskeletal:     Cervical back: Normal range of motion and neck supple.  Skin:    General: Skin is warm and dry.  Neurological:     General: No focal deficit present.     Mental Status: She is alert and oriented to person, place, and time.  Psychiatric:        Mood and Affect: Mood normal.        Behavior: Behavior normal.        Thought Content: Thought content normal.      BP 114/72 (BP Location: Left Arm, Patient Position: Sitting, Cuff Size: Large)   Pulse 85   Temp 97.8 F (36.6 C) (Temporal)   Ht 5\' 3"  (1.6 m)   Wt 192 lb (87.1 kg)   LMP  (LMP Unknown)   SpO2 97%   BMI 34.01 kg/m  Wt Readings from Last 3 Encounters:  05/24/23 192 lb (87.1 kg)  04/19/23 192 lb (87.1 kg)  01/11/23 188 lb (85.3 kg)       Assessment & Plan:   Problem  List Items Addressed This Visit       Musculoskeletal and Integument   Arthritis - Primary    Discussed topical analgesic, heat or ice, then try Tylenol 1,000 mg twice daily. She was prescribed meloxicam. Discussed using this prn with food. Consider referral to pain management if she is not getting adequate relief.       Relevant Medications   meloxicam (MOBIC) 7.5 MG tablet   Arthritis of knee, degenerative    Discussed topical analgesic, heat or ice, then try Tylenol 1,000 mg twice daily. She was prescribed meloxicam. Discussed using this prn with food.       Relevant Medications   meloxicam (MOBIC) 7.5 MG tablet     Other   Obesity (BMI 30.0-34.9)   Situational anxiety    Increase Buspar to 10 mg. She has alprazolam but drowsiness is an issue with this medication. Discussed limiting social media and other activities that are causing increased stress and anxiety. Follow up virtually in 4 weeks.       Relevant Medications   busPIRone (BUSPAR) 10 MG tablet   Other Visit Diagnoses     Need for influenza vaccination       Relevant Orders   Flu Vaccine Trivalent High Dose (Fluad) (Completed)      Visit time 22 minutes in face to  face communication with patient and coordination of care, additional 8 minutes spent in record review, coordination or care, ordering tests, communicating/referring to other healthcare professionals, documenting in medical records all on the same day of the visit for total time 30 minutes spent on the visit.    I have discontinued Audryna Propson. Record's busPIRone. I am also having her start on busPIRone. Additionally, I am having her maintain her vitamin B-12, vitamin C, multivitamin, Fish Oil, nitroGLYCERIN, aspirin EC, chlorthalidone, VITAMIN D PO, budesonide, loratadine, metoprolol succinate, amLODipine, lisinopril, triamcinolone cream, clobetasol cream, ALPRAZolam, atorvastatin, and meloxicam.  Meds ordered this encounter  Medications   busPIRone  (BUSPAR) 10 MG tablet    Sig: Take 1 tablet (10 mg total) by mouth 2 (two) times daily.    Dispense:  60 tablet    Refill:  2    Order Specific Question:   Supervising Provider    Answer:   Hillard Danker A [[id number]]

## 2023-05-25 DIAGNOSIS — M1990 Unspecified osteoarthritis, unspecified site: Secondary | ICD-10-CM | POA: Insufficient documentation

## 2023-05-25 NOTE — Assessment & Plan Note (Signed)
Increase Buspar to 10 mg. She has alprazolam but drowsiness is an issue with this medication. Discussed limiting social media and other activities that are causing increased stress and anxiety. Follow up virtually in 4 weeks.

## 2023-05-25 NOTE — Assessment & Plan Note (Signed)
Discussed topical analgesic, heat or ice, then try Tylenol 1,000 mg twice daily. She was prescribed meloxicam. Discussed using this prn with food. Consider referral to pain management if she is not getting adequate relief.

## 2023-05-25 NOTE — Assessment & Plan Note (Signed)
Discussed topical analgesic, heat or ice, then try Tylenol 1,000 mg twice daily. She was prescribed meloxicam. Discussed using this prn with food.

## 2023-06-14 ENCOUNTER — Other Ambulatory Visit: Payer: Self-pay | Admitting: Internal Medicine

## 2023-06-14 NOTE — Telephone Encounter (Signed)
last visit: 01/11/23 with plan to f/u in 6 months. Next visit: 07/20/23

## 2023-06-15 ENCOUNTER — Other Ambulatory Visit: Payer: Self-pay | Admitting: Family Medicine

## 2023-06-15 DIAGNOSIS — F418 Other specified anxiety disorders: Secondary | ICD-10-CM

## 2023-06-22 ENCOUNTER — Ambulatory Visit: Payer: Medicare Other

## 2023-06-22 VITALS — Ht 63.0 in | Wt 187.0 lb

## 2023-06-22 DIAGNOSIS — Z0000 Encounter for general adult medical examination without abnormal findings: Secondary | ICD-10-CM

## 2023-06-22 NOTE — Progress Notes (Signed)
Subjective:   Angel [name] is a 89 y.o. female who presents for Medicare Annual (Subsequent) preventive examination.  Visit Complete: Virtual I connected with  [name] on 06/22/23 by a audio enabled telemedicine application and verified that I am speaking with the correct person using two identifiers.  Patient Location: Home  Provider Location: Office/Clinic  I discussed the limitations of evaluation and management by telemedicine. The patient expressed understanding and agreed to proceed.  Vital Signs: Because this visit was a virtual/telehealth visit, some criteria may be missing or patient reported. Any vitals not documented were not able to be obtained and vitals that have been documented are patient reported.   Cardiac Risk Factors include: advanced age (>66men, >57 women);hypertension;Other (see comment);dyslipidemia, Risk factor comments: CAD, Aortic aneurysum w/o rupture, Aortic valve stenosis, Osteopenia     Objective:    Today's Vitals   06/22/23 0852  Weight: 187 lb (84.8 kg)  Height: 5\' 3"  (1.6 m)   Body mass index is 33.13 kg/m.     06/22/2023    9:07 AM 09/01/2021    9:09 AM 10/13/2020   10:30 AM 07/18/2020    2:37 PM 06/29/2020    1:03 PM 06/28/2020    3:47 PM 08/13/2019   11:48 AM  Advanced Directives  Does Patient Have a Medical Advance Directive? Yes Yes Yes Yes Yes Yes Yes  Type of Estate agent of [place];Living will Healthcare Power of [place];Living will Living will;Healthcare Power of [employer] Power of [place];Living will Healthcare Power of [place];Living will Healthcare Power of [place];Living will Healthcare Power of [place];Living will  Does patient want to make changes to medical advance directive?     No - Patient declined    Copy of Healthcare Power of Attorney in Chart? No - copy requested  No - copy requested  No - copy requested  No - copy requested    Current Medications (verified) Outpatient  Encounter Medications as of 06/22/2023  Medication Sig   ALPRAZolam (XANAX) 0.25 MG tablet TAKE 1 TABLET BY MOUTH 2 TIMES DAILY AS NEEDED.   amLODipine (NORVASC) 5 MG tablet Take 1 tablet (5 mg total) by mouth daily.   Ascorbic Acid (VITAMIN C) 100 MG tablet Take 500 mg by mouth daily. Gummy 750mg    aspirin EC 81 MG EC tablet Take 1 tablet (81 mg total) by mouth daily. Swallow whole.   atorvastatin (LIPITOR) 80 MG tablet TAKE 1 TABLET BY MOUTH EVERY DAY   budesonide (RHINOCORT AQUA) 32 MCG/ACT nasal spray as needed.   busPIRone (BUSPAR) 10 MG tablet TAKE 1 TABLET BY MOUTH TWICE A DAY   chlorthalidone (HYGROTON) 25 MG tablet TAKE 1/2 TABLET BY MOUTH EVERY DAY   clobetasol cream (TEMOVATE) 0.05 % Apply topically 2 (two) times daily.   lisinopril (ZESTRIL) 40 MG tablet TAKE 1 TABLET BY MOUTH EVERY DAY   loratadine (CLARITIN) 10 MG tablet Take 10 mg by mouth daily as needed for allergies.   meloxicam (MOBIC) 7.5 MG tablet Take 7.5 mg by mouth daily.   metoprolol succinate (TOPROL-XL) 100 MG 24 hr tablet TAKE 1 TABLET BY MOUTH EVERY DAY WITH OR IMMEDIATELY FOLLOWING A MEAL   Multiple Vitamin (MULTIVITAMIN) capsule Take 1 capsule by mouth daily.   nitroGLYCERIN (NITROSTAT) 0.4 MG SL tablet Place 1 tablet (0.4 mg total) under the tongue every 5 (five) minutes as needed for chest pain.   Omega-3 Fatty Acids (FISH OIL) 1000 MG CAPS Take by mouth daily. Krill oil  triamcinolone cream (KENALOG) 0.1 % Apply topically.   vitamin B-12 (CYANOCOBALAMIN) 100 MCG tablet Take 1,000 mcg by mouth daily.    VITAMIN D PO Take 1 tablet by mouth daily.   No facility-administered encounter medications on file as of 06/22/2023.    Allergies (verified) Codeine, Levofloxacin, Myrbetriq [mirabegron], Prednisone, and Sulfa antibiotics   History: Past Medical History:  Diagnosis Date   Acute CVA (cerebrovascular accident) (HCC) 06/28/2020   Anxiety    Aortic aneurysm without rupture (HCC)    Aortic stenosis     mild-moderate by echo   Arthritis    CAD (coronary artery disease)    Chronic hip pain    Endometriosis    Hearing loss    HTN, goal below 150/90    Hyperlipidemia LDL goal <70 11/18/2014   Osteopenia of the elderly    Urge and stress incontinence    Past Surgical History:  Procedure Laterality Date   ABDOMINAL HYSTERECTOMY  1976   total   APPENDECTOMY     BREAST EXCISIONAL BIOPSY Left 1987   benign   CATARACT EXTRACTION, BILATERAL  08/2018   Dr. 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  Tobacco Use   Smoking status: Never   Smokeless tobacco: Never  Vaping Use   Vaping status: Never Used  Substance and Sexual Activity   Alcohol use: Yes    Alcohol/week: 1.0 standard drink of alcohol    Types: 1 Cans of beer per week    Comment: once a month   Drug use: No   Sexual activity: Not on file  Other Topics Concern   Not on file  Social History Narrative   Not on file   Social Determinants of Health   Financial Resource Strain: Low Risk  (06/22/2023)   Overall Financial Resource Strain (CARDIA)    Difficulty of Paying Living Expenses: Not very hard  Food Insecurity: No Food Insecurity (06/22/2023)   Hunger Vital Sign    Worried About Running Out of Food in the Last Year: Never true    Ran Out of Food in the Last Year: Never true  Transportation Needs: No Transportation Needs (06/22/2023)   PRAPARE - Administrator, Civil Service (Medical): No    Lack of Transportation (Non-Medical): No   Physical Activity: Inactive (06/22/2023)   Exercise Vital Sign    Days of Exercise per Week: 0 days    Minutes of Exercise per Session: 0 min  Stress: No Stress Concern Present (06/22/2023)   [employer] of Occupational Health - Occupational Stress Questionnaire    Feeling of Stress : Only a little  Social Connections: Socially Isolated (06/22/2023)   Social Connection and Isolation Panel [NHANES]    Frequency of Communication with Friends and Family: More than three times a week    Frequency of Social Gatherings with Friends and Family: More than three times a week    Attends  Religious Services: Never    Active Member of Clubs or Organizations: No    Attends Banker Meetings: Never    Marital Status: Widowed    Tobacco Counseling Counseling given: Not Answered   Clinical Intake:  Pre-visit preparation completed: Yes  Pain : No/denies pain     BMI - recorded: 33.13 Nutritional Status: BMI > 30  Obese Nutritional Risks: None Diabetes: No  How often do you need to have someone help you when you read instructions, pamphlets, or other written materials from your doctor or pharmacy?: 1 - Never  Interpreter Needed?: No  Information entered by :: Tempest Frankland, RMA   Activities of Daily Living    06/22/2023    8:52 AM  In your present state of health, do you have any difficulty performing the following activities:  Hearing? 1  Comment Wears hearing aides  Vision? 0  Difficulty concentrating or making decisions? 0  Walking or climbing stairs? 0  Dressing or bathing? 0  Doing errands, shopping? 0  Preparing Food and eating ? N  Using the Toilet? N  In the past six months, have you accidently leaked urine? Y  Comment wears pads  Do you have problems with loss of bowel control? N  Managing your Medications? N  Managing your Finances? N  Housekeeping or managing your Housekeeping? N    Patient Care Team: Avanell Shackleton, NP-C as PCP - General  (Family Medicine) End, Cristal Deer, MD as PCP - Cardiology (Cardiology) Smith Robert, MD (Urology)  Indicate any recent Medical Services you may have received from other than [facility] providers in the past year (date may be approximate).     Assessment:   This is a routine wellness examination for [place].  Hearing/Vision screen Hearing Screening - Comments:: Wears hearing aides Vision Screening - Comments:: Denies vision issues   Goals Addressed             This Visit's Progress    DIET - INCREASE WATER INTAKE   On track    Recommend drinking 6-8 glasses of water per day      Depression Screen    06/22/2023    9:14 AM 05/24/2023    1:02 PM 04/19/2023    8:29 AM 09/14/2021   11:47 AM 03/09/2021   11:39 AM 12/04/2020    8:28 AM 11/12/2020   10:32 AM  PHQ 2/9 Scores  PHQ - 2 Score 1 1 0 2 0 0 0  PHQ- 9 Score 3   3 0      Fall Risk    06/22/2023    9:08 AM 04/19/2023    8:29 AM 12/04/2020    8:27 AM 11/12/2020   10:31 AM 10/13/2020   10:31 AM  Fall Risk   Falls in the past year? 0 0 0 0 1  Number falls in past yr: 0 0 0 0 0  Injury with Fall? 0 0 0 0 1  Risk for fall due to : No Fall Risks No Fall Risks No Fall Risks No Fall Risks Impaired balance/gait  Follow up Falls prevention discussed;Falls evaluation completed Falls evaluation completed Falls evaluation completed Falls evaluation completed Falls evaluation completed;Education provided    MEDICARE RISK AT HOME: Medicare Risk at Home Any stairs in or around the home?: Yes (she does not use the stairs) If so, are there any without handrails?: Yes Home free of loose throw rugs in walkways, pet beds, electrical cords, etc?: Yes Adequate lighting in your home  to reduce risk of falls?: Yes Life alert?: No (apple watch) Use of a cane, walker or w/c?: Yes Grab bars in the bathroom?: Yes Shower chair or bench in shower?: No Elevated toilet seat or a handicapped toilet?: Yes  TIMED UP AND GO:  Was the test performed?  No     Cognitive Function:        06/22/2023    9:10 AM 08/13/2019   11:54 AM  6CIT Screen  What Year? 0 points 0 points  What month? 0 points 0 points  What time? 0 points 0 points  Count back from 20 0 points 0 points  Months in reverse 0 points 0 points  Repeat phrase 0 points 0 points  Total Score 0 points 0 points    Immunizations Immunization History  Administered Date(s) Administered   Fluad Quad(high Dose 65+) 04/18/2019, 05/26/2022   Fluad Trivalent(High Dose 65+) 05/24/2023   Influenza, High Dose Seasonal PF 08/04/2016, 06/01/2017, 05/17/2018, 06/02/2021   Influenza-Unspecified 04/09/2015, 05/09/2015   PFIZER Comirnaty(Gray Top)Covid-19 Tri-Sucrose Vaccine 01/18/2021   PFIZER(Purple Top)SARS-COV-2 Vaccination 08/23/2019, 09/13/2019, 05/12/2020   Pfizer(Comirnaty)Fall Seasonal Vaccine 12 years and older 05/26/2022, 04/04/2023   Tdap 01/18/2018   Zoster Recombinant(Shingrix) 01/25/2018    TDAP status: Up to date  Flu Vaccine status: Up to date  Pneumococcal vaccine status: Declined,  Education has been provided regarding the importance of this vaccine but patient still declined. Advised may receive this vaccine at local pharmacy or Health Dept. Aware to provide a copy of the vaccination record if obtained from local pharmacy or Health Dept. Verbalized acceptance and understanding.   Covid-19 vaccine status: Completed vaccines  Qualifies for Shingles Vaccine? Yes   Zostavax completed Yes   Shingrix Completed?: No.    Education has been provided regarding the importance of this vaccine. Patient has been advised to call insurance company to determine out of pocket expense if they have not yet received this vaccine. Advised may also receive vaccine at local pharmacy or Health Dept. Verbalized acceptance and understanding.  Screening Tests Health Maintenance  Topic Date Due   COVID-19 Vaccine (7 - 2023-24 season) 07/08/2023 (Originally 05/30/2023)   Zoster Vaccines-  Shingrix (2 of 2) 08/24/2023 (Originally 03/22/2018)   Pneumonia Vaccine 25+ Years old (1 of 1 - PCV) 06/21/2024 (Originally 11/21/1997)   Medicare Annual Wellness (AWV)  06/21/2024   DTaP/Tdap/Td (2 - Td or Tdap) 01/19/2028   INFLUENZA VACCINE  Completed   DEXA SCAN  Addressed   HPV VACCINES  Aged Out    Health Maintenance  There are no preventive care reminders to display for this patient.   Colorectal cancer screening: No longer required.   Mammogram status: No longer required due to age.  Bone Density status: Completed 09/08/2010. Results reflect: Bone density results: OSTEOPENIA. Repeat every 2 years.  Lung Cancer Screening: (Low Dose CT Chest recommended if Age 26-80 years, 20 pack-year currently smoking OR have quit w/in 15years.) does not qualify.   Lung Cancer Screening Referral: N/A  Additional Screening:  Hepatitis C Screening: does not qualify;   Vision Screening: Recommended annual ophthalmology exams for early detection of glaucoma and other disorders of the eye. Is the patient up to date with their annual eye exam?  Yes  Who is the provider or what is the name of the office in which the patient attends annual eye exams? Dr. Lorin Picket If pt is not established with a provider, would they like to be referred to a provider to establish care? No .  Dental Screening: Recommended annual dental exams for proper oral hygiene   Community Resource Referral / Chronic Care Management: CRR required this visit?  No   CCM required this visit?  No     Plan:     I have personally reviewed and noted the following in the patient's chart:   Medical and social history Use of alcohol, tobacco or illicit drugs  Current medications and supplements including opioid prescriptions. Patient is not currently taking opioid prescriptions. Functional ability and status Nutritional status Physical activity Advanced directives List of other physicians Hospitalizations, surgeries, and  ER visits in previous 12 months Vitals Screenings to include cognitive, depression, and falls Referrals and appointments  In addition, I have reviewed and discussed with patient certain preventive protocols, quality metrics, and best practice recommendations. A written personalized care plan for preventive services as well as general preventive health recommendations were provided to patient.     Aramis Weil L Kashauna Celmer, CMA   06/22/2023   After Visit Summary: (MyChart) Due to this being a telephonic visit, the after visit summary with patients personalized plan was offered to patient via MyChart   Nurse Notes: Patient is up to date on all health maintenance.  She had an order place last year for a DEXA, however she would like to discuss with [name] during her up coming visit tomorrow.  Patient would like to wait to schedule her AWV for next year.  Patient had no other concerns to address today.

## 2023-06-22 NOTE — Patient Instructions (Signed)
Angel [name] , Thank you for taking time to come for your Medicare Wellness Visit. I appreciate your ongoing commitment to your health goals. Please review the following plan we discussed and let me know if I can assist you in the future.   Referrals/Orders/Follow-Ups/Clinician Recommendations: Remember to discuss your Bone Density order with [name] during your visit with her tomorrow.  It was a pleasure talking with you today.  Keep up the good work.  This is a list of the screening recommended for you and due dates:  Health Maintenance  Topic Date Due   COVID-19 Vaccine (7 - 2023-24 season) 07/08/2023*   Zoster (Shingles) Vaccine (2 of 2) 08/24/2023*   Pneumonia Vaccine (1 of 1 - PCV) 06/21/2024*   Medicare Annual Wellness Visit  06/21/2024   DTaP/Tdap/Td vaccine (2 - Td or Tdap) 01/19/2028   Flu Shot  Completed   DEXA scan (bone density measurement)  Addressed   HPV Vaccine  Aged Out  *Topic was postponed. The date shown is not the original due date.    Advanced directives: (Copy Requested) Please bring a copy of your health care power of attorney and living will to the office to be added to your chart at your convenience.  Next Medicare Annual Wellness Visit scheduled for next year: No

## 2023-06-23 ENCOUNTER — Telehealth: Payer: Medicare Other | Admitting: Family Medicine

## 2023-06-27 DIAGNOSIS — M5136 Other intervertebral disc degeneration, lumbar region with discogenic back pain only: Secondary | ICD-10-CM | POA: Diagnosis not present

## 2023-06-27 DIAGNOSIS — M9902 Segmental and somatic dysfunction of thoracic region: Secondary | ICD-10-CM | POA: Diagnosis not present

## 2023-06-27 DIAGNOSIS — M9903 Segmental and somatic dysfunction of lumbar region: Secondary | ICD-10-CM | POA: Diagnosis not present

## 2023-06-27 DIAGNOSIS — M9905 Segmental and somatic dysfunction of pelvic region: Secondary | ICD-10-CM | POA: Diagnosis not present

## 2023-07-20 ENCOUNTER — Ambulatory Visit: Payer: Medicare Other | Attending: Internal Medicine | Admitting: Internal Medicine

## 2023-07-20 ENCOUNTER — Encounter: Payer: Self-pay | Admitting: Internal Medicine

## 2023-07-20 VITALS — BP 122/80 | HR 83 | Ht 63.0 in | Wt 187.0 lb

## 2023-07-20 DIAGNOSIS — I350 Nonrheumatic aortic (valve) stenosis: Secondary | ICD-10-CM | POA: Insufficient documentation

## 2023-07-20 DIAGNOSIS — I25118 Atherosclerotic heart disease of native coronary artery with other forms of angina pectoris: Secondary | ICD-10-CM | POA: Diagnosis not present

## 2023-07-20 DIAGNOSIS — E785 Hyperlipidemia, unspecified: Secondary | ICD-10-CM | POA: Insufficient documentation

## 2023-07-20 DIAGNOSIS — R9431 Abnormal electrocardiogram [ECG] [EKG]: Secondary | ICD-10-CM | POA: Insufficient documentation

## 2023-07-20 DIAGNOSIS — I10 Essential (primary) hypertension: Secondary | ICD-10-CM | POA: Diagnosis not present

## 2023-07-20 NOTE — Progress Notes (Signed)
Cardiology Office Note:  .   Date:  07/20/2023  ID:  Angel [name], Angel [name] Oct 06, 1930, MRN [id number] PCP: Avanell Shackleton, NP-C  [facility] Providers Cardiologist:  Yvonne Kendall, MD     History of Present Illness: .   Angel [name] is a 89 y.o. 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  I last saw her in June, at which time she felt similar to prior visits, noting sporadic chest pain brought on by emotional stress.  We again discussed repeating ischemia evaluation, which Angel [name] wished to defer.  We did not make any medication changes or pursue further testing.  Today, Angel [name] reports that she feels about the same as at prior visits.  She still has sporadic pain in her chest that often begins with tension in her jaw and then radiates down to the chest area.  When this happens, she does deep breathing exercises with resolution of the pain in 20 minutes or less.  She can sometimes go for months without pain but then have it more frequently, every few weeks.  It seems to be related to emotional stressors and is not exertional.  She has not had any dyspnea, though she notes that she has fairly sedentary.  Her activity is mostly limited by hip and low back pain.  She has occasional swelling in her legs, which is stable.  She denies palpitations and lightheadedness.  ROS: See HPI  Studies Reviewed: [name]   EKG Interpretation Date/Time:  Thursday July 20 2023 11:01:25 EST Ventricular Rate:  83 PR Interval:  168 QRS Duration:  72 QT Interval:  402 QTC Calculation: 472 R Axis:   -18  Text Interpretation: Normal sinus rhythm Possible Left atrial enlargement Minimal voltage criteria for  LVH, may be normal variant ( R in aVL ) Possible Anterior infarct (cited on or before 01-Sep-2021) When compared with ECG of 11-Dec-2022 No significant change was found Confirmed by Love Chowning ([id number]) on 07/20/2023 7:56:16 PM    TTE (05/18/2022): Normal LV size with mild LVH.  LVEF 60-65% with normal wall motion and grade 1 diastolic dysfunction.  Normal RV size and function.  Normal biatrial size.  No pericardial effusion.  Normal mitral valve.  Calcified aortic valve with mild to moderate stenosis (mean gradient 12 mmHg, AVA 1.1 cm).  Normal CVP.  Risk Assessment/Calculations:             Physical Exam:   VS:  BP 122/80 (BP Location: Left Arm, Patient Position: Sitting, Cuff Size: Large)   Pulse 83   Ht 5\' 3"  (1.6 m)   Wt 187 lb (84.8 kg)   LMP  (LMP Unknown)   SpO2 94%   BMI 33.13 kg/m    Wt Readings from Last 3 Encounters:  07/20/23 187 lb (84.8 kg)  06/22/23 187 lb (84.8 kg)  05/24/23 192 lb (87.1 kg)    General:  NAD. Neck: No JVD or HJR. Lungs: Clear to auscultation bilaterally without wheezes or crackles. Heart: Regular rate and rhythm with 2/6 systolic murmur. Abdomen: Soft, nontender, nondistended. Extremities: Trace pretibial edema bilaterally..  ASSESSMENT AND PLAN: .    Coronary artery disease with stable angina and abnormal EKG: Overall, Angel [name] feels about the same  as at prior visits with occasional chest discomfort associated with emotional stressors.  This has been present for years and does not seem to be getting any worse.  She has not had any exertional chest pain, though her activity level has been quite limited on account of low back and hip pain.  We discussed repeating ischemia evaluation, though Angel [name] wishes to defer this.  We will continue her current regimen of aspirin, atorvastatin, amlodipine, and metoprolol succinate.  Aortic stenosis: Most recent echo a year ago showed mild to moderate aortic stenosis.  I do not think her episodic chest  pain is related to her AS.  We will continue clinical follow-up and plan for repeat echocardiogram shortly before our next visit in 6 months.  Hypertension: Blood pressure adequately controlled today.  Continue current regimen of amlodipine, lisinopril, metoprolol succinate, and chlorthalidone.  Hyperlipidemia: LDL slightly above goal at 96 on last check in June.  We discussed addition of ezetimibe to atorvastatin 80 mg daily but have agreed to defer this.    Dispo: Return to clinic in 6 months with echocardiogram shortly before follow-up visit.  Signed, Yvonne Kendall, MD

## 2023-07-20 NOTE — Patient Instructions (Signed)
Medication Instructions:  Your physician recommends that you continue on your current medications as directed. Please refer to the Current Medication list given to you today.   *If you need a refill on your cardiac medications before your next appointment, please call your pharmacy*   Lab Work: No labs ordered today    Testing/Procedures: Your physician has requested that you have an echocardiogram in June, 2025. Echocardiography is a painless test that uses sound waves to create images of your heart. It provides your doctor with information about the size and shape of your heart and how well your heart's chambers and valves are working.   You may receive an ultrasound enhancing agent through an IV if needed to better visualize your heart during the echo. This procedure takes approximately one hour.  There are no restrictions for this procedure.  This will take place at [address] [facility]) [address], [place] [address]  Please note: We ask at that you not bring children with you during ultrasound (echo/ vascular) testing. Due to room size and safety concerns, children are not allowed in the ultrasound rooms during exams. Our front office staff cannot provide observation of children in our lobby area while testing is being conducted. An adult accompanying a patient to their appointment will only be allowed in the ultrasound room at the discretion of the ultrasound technician under special circumstances. We apologize for any inconvenience.    Follow-Up: At [facility], you and your health needs are our priority.  As part of our continuing mission to provide you with exceptional heart care, we have created designated Provider Care Teams.  These Care Teams include your primary Cardiologist (physician) and Advanced Practice Providers (APPs -  Physician Assistants and Nurse Practitioners) who all work together to provide you with the care you need, when you need it.  We  recommend signing up for the patient portal called "MyChart".  Sign up information is provided on this After Visit Summary.  MyChart is used to connect with patients for Virtual Visits (Telemedicine).  Patients are able to view lab/test results, encounter notes, upcoming appointments, etc.  Non-urgent messages can be sent to your provider as well.   To learn more about what you can do with MyChart, go to [email or web address].    Your next appointment:   6 month(s)  Provider:   You may see Yvonne Kendall, MD or one of the following Advanced Practice Providers on your designated Care Team:   Nicolasa Ducking, NP Eula Listen, PA-C Cadence Fransico Michael, PA-C Charlsie Quest, NP Carlos Levering, NP

## 2023-07-25 DIAGNOSIS — M9905 Segmental and somatic dysfunction of pelvic region: Secondary | ICD-10-CM | POA: Diagnosis not present

## 2023-07-25 DIAGNOSIS — M5136 Other intervertebral disc degeneration, lumbar region with discogenic back pain only: Secondary | ICD-10-CM | POA: Diagnosis not present

## 2023-07-25 DIAGNOSIS — M9902 Segmental and somatic dysfunction of thoracic region: Secondary | ICD-10-CM | POA: Diagnosis not present

## 2023-07-25 DIAGNOSIS — M9903 Segmental and somatic dysfunction of lumbar region: Secondary | ICD-10-CM | POA: Diagnosis not present

## 2023-08-18 ENCOUNTER — Other Ambulatory Visit: Payer: Self-pay | Admitting: Physician Assistant

## 2023-08-18 ENCOUNTER — Other Ambulatory Visit: Payer: Self-pay | Admitting: Internal Medicine

## 2023-08-18 DIAGNOSIS — E785 Hyperlipidemia, unspecified: Secondary | ICD-10-CM

## 2023-09-06 ENCOUNTER — Other Ambulatory Visit: Payer: Self-pay | Admitting: Internal Medicine

## 2023-10-26 ENCOUNTER — Other Ambulatory Visit: Payer: Self-pay | Admitting: Family Medicine

## 2023-10-26 ENCOUNTER — Other Ambulatory Visit: Payer: Self-pay | Admitting: Internal Medicine

## 2023-10-26 DIAGNOSIS — F418 Other specified anxiety disorders: Secondary | ICD-10-CM

## 2023-10-26 NOTE — Telephone Encounter (Signed)
 LOV: 05/24/23 Last fill: 06/15/23, 180 tablet 0 refill

## 2023-11-29 ENCOUNTER — Ambulatory Visit: Admitting: Family Medicine

## 2023-11-29 ENCOUNTER — Encounter: Payer: Self-pay | Admitting: Family Medicine

## 2023-11-29 VITALS — BP 112/64 | HR 94 | Temp 97.6°F | Ht 63.0 in | Wt 183.0 lb

## 2023-11-29 DIAGNOSIS — N3946 Mixed incontinence: Secondary | ICD-10-CM | POA: Diagnosis not present

## 2023-11-29 DIAGNOSIS — M1990 Unspecified osteoarthritis, unspecified site: Secondary | ICD-10-CM | POA: Diagnosis not present

## 2023-11-29 DIAGNOSIS — I10 Essential (primary) hypertension: Secondary | ICD-10-CM

## 2023-11-29 DIAGNOSIS — I25118 Atherosclerotic heart disease of native coronary artery with other forms of angina pectoris: Secondary | ICD-10-CM

## 2023-11-29 DIAGNOSIS — G8929 Other chronic pain: Secondary | ICD-10-CM

## 2023-11-29 DIAGNOSIS — Z7189 Other specified counseling: Secondary | ICD-10-CM | POA: Diagnosis not present

## 2023-11-29 DIAGNOSIS — M170 Bilateral primary osteoarthritis of knee: Secondary | ICD-10-CM

## 2023-11-29 DIAGNOSIS — R7303 Prediabetes: Secondary | ICD-10-CM | POA: Diagnosis not present

## 2023-11-29 DIAGNOSIS — M25559 Pain in unspecified hip: Secondary | ICD-10-CM

## 2023-11-29 LAB — CBC WITH DIFFERENTIAL/PLATELET
Basophils Absolute: 0 10*3/uL (ref 0.0–0.1)
Basophils Relative: 0.5 % (ref 0.0–3.0)
Eosinophils Absolute: 0.2 10*3/uL (ref 0.0–0.7)
Eosinophils Relative: 3 % (ref 0.0–5.0)
HCT: 39.7 % (ref 36.0–46.0)
Hemoglobin: 13.3 g/dL (ref 12.0–15.0)
Lymphocytes Relative: 19.3 % (ref 12.0–46.0)
Lymphs Abs: 1.6 10*3/uL (ref 0.7–4.0)
MCHC: 33.6 g/dL (ref 30.0–36.0)
MCV: 98.1 fl (ref 78.0–100.0)
Monocytes Absolute: 0.7 10*3/uL (ref 0.1–1.0)
Monocytes Relative: 7.9 % (ref 3.0–12.0)
Neutro Abs: 5.7 10*3/uL (ref 1.4–7.7)
Neutrophils Relative %: 69.3 % (ref 43.0–77.0)
Platelets: 272 10*3/uL (ref 150.0–400.0)
RBC: 4.05 Mil/uL (ref 3.87–5.11)
RDW: 13.2 % (ref 11.5–15.5)
WBC: 8.2 10*3/uL (ref 4.0–10.5)

## 2023-11-29 LAB — COMPREHENSIVE METABOLIC PANEL WITH GFR
ALT: 23 U/L (ref 0–35)
AST: 24 U/L (ref 0–37)
Albumin: 4.4 g/dL (ref 3.5–5.2)
Alkaline Phosphatase: 50 U/L (ref 39–117)
BUN: 33 mg/dL — ABNORMAL HIGH (ref 6–23)
CO2: 29 meq/L (ref 19–32)
Calcium: 9.9 mg/dL (ref 8.4–10.5)
Chloride: 103 meq/L (ref 96–112)
Creatinine, Ser: 0.93 mg/dL (ref 0.40–1.20)
GFR: 53.92 mL/min — ABNORMAL LOW (ref >60.00)
Glucose, Bld: 112 mg/dL — ABNORMAL HIGH (ref 70–99)
Potassium: 3.7 meq/L (ref 3.5–5.1)
Sodium: 142 meq/L (ref 135–145)
Total Bilirubin: 0.4 mg/dL (ref 0.2–1.2)
Total Protein: 7.5 g/dL (ref 6.0–8.3)

## 2023-11-29 LAB — TSH: TSH: 1.13 u[IU]/mL (ref 0.35–5.50)

## 2023-11-29 LAB — HEMOGLOBIN A1C: Hgb A1c MFr Bld: 6.3 % (ref 4.6–6.5)

## 2023-11-29 MED ORDER — DICLOFENAC SODIUM 1 % EX GEL: 2.0000 g | Freq: Four times a day (QID) | CUTANEOUS | 2 refills | Status: DC

## 2023-11-29 NOTE — Patient Instructions (Addendum)
   Review who is on your HIPAA form today  Please go downstairs for labs before you leave.  I prescribed Voltaren  gel to use on joints that are aching  Review your advance directives and be sure they are up to date.   I placed a referral to palliative care and they will call you to schedule a visit.  Try [employer] store to see if they can help you with support needed  Continue meditation and surround yourself with people who care about you   I will be in touch with your results.

## 2023-11-29 NOTE — Progress Notes (Signed)
 Subjective:     Patient ID: Angel [name], female    DOB: January 10, 1931, 89 y.o.   MRN: [id number]  Chief Complaint  Patient presents with   Medical Management of Chronic Issues    Wants to bring you up to date on her "dying process" and wants daughter to meet Jerid Catherman    HPI  History of Present Illness         Here with her daughter in law.  States she would like to discuss how she is doing physically and overall.  Steadily life is becoming more challenging for her due to declining health. Reports having a good support network with her family and close friends.  States she is becoming more willing to give up duties around her property.  She is still driving and feels like she is safe to do so.  She wears hearing aids  Dr Mason Sole is her eye doctor.   Complains of urinary incontinence.  Going through 4-5 pads per day.  History of surgery, ?  Cystocele.   Complains of arthritis.  She has ongoing hip pain, knee pain and foot pain   States she enjoys life.  Managing stress.  States she does her morning meditation and has a prayer circle.  States she recently acquired a new puppy who gives her a lot of joy.  She sees cardiology and has an upcoming echocardiogram.  She has advanced directives but will need to check and see if they are updated.  Health Maintenance Due  Topic Date Due   Zoster Vaccines- Shingrix (2 of 2) 03/22/2018   COVID-19 Vaccine (7 - Pfizer risk 2024-25 season) 10/05/2023    Past Medical History:  Diagnosis Date   Acute CVA (cerebrovascular accident) (HCC) 06/28/2020   Anxiety    Aortic aneurysm without rupture (HCC)    Aortic stenosis    mild-moderate by echo   Arthritis    CAD (coronary artery disease)    Chronic hip pain    Endometriosis    Hearing loss    HTN, goal below 150/90    Hyperlipidemia LDL goal <70 11/18/2014   Osteopenia of the elderly    Urge and stress incontinence     Past Surgical History:  Procedure Laterality Date   ABDOMINAL  HYSTERECTOMY  1976   total   APPENDECTOMY     BREAST EXCISIONAL BIOPSY Left 1987   benign   CATARACT EXTRACTION, BILATERAL  08/2018   Dr. 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  Tobacco Use   Smoking status: Never   Smokeless tobacco: Never  Vaping Use   Vaping status: Never Used  Substance and Sexual Activity   Alcohol use: Yes    Alcohol/week: 1.0 standard drink of alcohol    Types: 1 Cans of beer per week    Comment: once a month   Drug use: No   Sexual activity: Not on  file  Other Topics Concern   Not on file  Social History Narrative   Not on file   Social Drivers of Health   Financial Resource Strain: Low Risk  (06/22/2023)   Overall Financial Resource Strain (CARDIA)    Difficulty of Paying Living Expenses: Not very hard  Food Insecurity: No Food Insecurity (06/22/2023)   Hunger Vital Sign    Worried About Running Out of Food in the Last Year: Never true    Ran Out of Food in the Last Year: Never true  Transportation Needs: No Transportation Needs (06/22/2023)   PRAPARE - Administrator, Civil Service (Medical): No    Lack of Transportation (Non-Medical): No  Physical Activity: Inactive (06/22/2023)   Exercise Vital Sign    Days of Exercise per Week: 0 days    Minutes of Exercise per Session: 0 min  Stress: No Stress Concern Present (06/22/2023)   [employer] of Occupational Health - Occupational Stress Questionnaire    Feeling of Stress : Only a little  Social  Connections: Socially Isolated (06/22/2023)   Social Connection and Isolation Panel [NHANES]    Frequency of Communication with Friends and Family: More than three times a week    Frequency of Social Gatherings with Friends and Family: More than three times a week    Attends Religious Services: Never    Database administrator or Organizations: No    Attends Banker Meetings: Never    Marital Status: Widowed  Intimate Partner Violence: Patient Unable To Answer (06/22/2023)   Humiliation, Afraid, Rape, and Kick questionnaire    Fear of Current or Ex-Partner: Patient unable to answer    Emotionally Abused: Patient unable to answer    Physically Abused: Patient unable to answer    Sexually Abused: Patient unable to answer    Outpatient Medications Prior to Visit  Medication Sig Dispense Refill   ALPRAZolam  (XANAX ) 0.25 MG tablet TAKE 1 TABLET BY MOUTH 2 TIMES DAILY AS NEEDED. 30 tablet 0   amLODipine  (NORVASC ) 5 MG tablet TAKE 1 TABLET (5 MG TOTAL) BY MOUTH DAILY. 90 tablet 2   Ascorbic Acid (VITAMIN C) 100 MG tablet Take 500 mg by mouth daily. Gummy 750mg      aspirin  EC 81 MG EC tablet Take 1 tablet (81 mg total) by mouth daily. Swallow whole. 30 tablet 11   atorvastatin  (LIPITOR) 80 MG tablet TAKE 1 TABLET BY MOUTH EVERY DAY 90 tablet 2   budesonide (RHINOCORT AQUA) 32 MCG/ACT nasal spray as needed.     busPIRone  (BUSPAR ) 10 MG tablet TAKE 1 TABLET BY MOUTH TWICE A DAY 180 tablet 0   chlorthalidone  (HYGROTON ) 25 MG tablet TAKE 1/2 TABLET BY MOUTH EVERY DAY 45 tablet 3   clobetasol  cream (TEMOVATE ) 0.05 % Apply topically 2 (two) times daily.     lisinopril  (ZESTRIL ) 40 MG tablet TAKE 1 TABLET BY MOUTH EVERY DAY 90 tablet 3   metoprolol  succinate (TOPROL -XL) 100 MG 24 hr tablet TAKE 1 TABLET BY MOUTH EVERY DAY WITH OR IMMEDIATELY FOLLOWING A MEAL 90 tablet 2   Multiple Vitamin (MULTIVITAMIN) capsule Take 1 capsule by mouth daily.     nitroGLYCERIN  (NITROSTAT ) 0.4 MG SL tablet  Place 1 tablet (0.4 mg total) under the tongue every 5 (five) minutes as needed for chest pain. 25 tablet 3   Omega-3 Fatty Acids (FISH OIL) 1000 MG CAPS Take by mouth daily. Krill oil     triamcinolone  cream (KENALOG ) 0.1 % Apply  topically.     vitamin B-12 (CYANOCOBALAMIN) 100 MCG tablet Take 1,000 mcg by mouth daily.      VITAMIN D PO Take 1 tablet by mouth daily.     loratadine (CLARITIN) 10 MG tablet Take 10 mg by mouth daily as needed for allergies. (Patient not taking: Reported on 07/20/2023)     meloxicam  (MOBIC ) 7.5 MG tablet Take 7.5 mg by mouth daily. (Patient not taking: Reported on 07/20/2023)     No facility-administered medications prior to visit.    Allergies  Allergen Reactions   Codeine Other (See Comments)    "zoned out"   Levofloxacin Other (See Comments)    AMS   Mirabegron  Other (See Comments)    Severe depression   Prednisone Other (See Comments)    AMS   Sulfa Antibiotics Other (See Comments)    AMS    Review of Systems  Constitutional:  Positive for malaise/fatigue. Negative for chills, fever and weight loss.  Eyes:  Negative for blurred vision, double vision and photophobia.  Respiratory:  Negative for shortness of breath.   Cardiovascular:  Negative for chest pain, palpitations and leg swelling.  Gastrointestinal:  Negative for abdominal pain, constipation, diarrhea, nausea and vomiting.  Genitourinary:  Positive for frequency and urgency. Negative for dysuria.  Musculoskeletal:  Positive for back pain, joint pain and myalgias. Negative for falls.  Neurological:  Negative for dizziness, focal weakness and headaches.       Objective:    Physical Exam Constitutional:      General: She is not in acute distress.    Appearance: She is not ill-appearing.  HENT:     Mouth/Throat:     Mouth: Mucous membranes are moist.  Eyes:     General: Lids are normal. No visual field deficit.    Extraocular Movements: Extraocular movements intact.     Right eye:  Normal extraocular motion and no nystagmus.     Left eye: Normal extraocular motion and no nystagmus.     Conjunctiva/sclera: Conjunctivae normal.  Cardiovascular:     Rate and Rhythm: Normal rate and regular rhythm.  Pulmonary:     Effort: Pulmonary effort is normal.     Breath sounds: Normal breath sounds.  Musculoskeletal:     Cervical back: Normal range of motion and neck supple.     Right lower leg: No edema.     Left lower leg: No edema.  Skin:    General: Skin is warm and dry.  Neurological:     General: No focal deficit present.     Mental Status: She is alert and oriented to person, place, and time.     Motor: No weakness.     Coordination: Coordination normal.  Psychiatric:        Mood and Affect: Mood normal.        Behavior: Behavior normal.        Thought Content: Thought content normal.      BP 112/64 (BP Location: Left Arm, Patient Position: Sitting)   Pulse 94   Temp 97.6 F (36.4 C) (Temporal)   Ht 5\' 3"  (1.6 m)   Wt 183 lb (83 kg)   LMP  (LMP Unknown)   SpO2 95%   BMI 32.42 kg/m  Wt Readings from Last 3 Encounters:  11/29/23 183 lb (83 kg)  07/20/23 187 lb (84.8 kg)  06/22/23 187 lb (84.8 kg)       Assessment & Plan:   Problem List Items Addressed This Visit  CAD (coronary artery disease) (Chronic)   Relevant Orders   Amb Referral to Palliative Care   Arthritis   Relevant Orders   Amb Referral to Palliative Care   Arthritis of knee, degenerative   Relevant Orders   Amb Referral to Palliative Care   Counseling regarding advance care planning and goals of care   Relevant Orders   Amb Referral to Palliative Care   Essential hypertension - Primary   Relevant Orders   CBC with Differential/Platelet (Completed)   Comprehensive metabolic panel with GFR (Completed)   TSH (Completed)   Hip pain, chronic   Mixed stress and urge urinary incontinence   Prediabetes   Relevant Orders   CBC with Differential/Platelet (Completed)    Comprehensive metabolic panel with GFR (Completed)   Hemoglobin A1c (Completed)   TSH (Completed)   In depth discussion of management of aging and chronic health conditions. Family member present. She has a good support network. Realizes she can no longer manage same responsibilities as she has in the past and is able to ask for assistance.  Discussion on chronic pain management. We discussed topical analgesics, Tylenol  and NSAIDs prn. She has an orthopedist as well.  Recommend going to [employer] store to see if she can improve footwear to help with foot pain. She has a podiatrist.  Referred to urogyne for incontinence.  Under the care of cardiology and has upcoming echocardiogram.  Discuss updating advance directives if not already updated.  Referral to palliative care.  Follow up in 3 months or sooner if needed.   Visit time 40 minutes in face to face communication with patient and coordination of care, additional 10 minutes spent in record review, coordination or care, ordering tests, communicating/referring to other healthcare professionals, documenting in medical records all on the same day of the visit for total time 50 minutes spent on the visit.    I have discontinued Jarita Raval. Sova's loratadine and meloxicam . I am also having her start on diclofenac  Sodium. Additionally, I am having her maintain her vitamin B-12, vitamin C, multivitamin, Fish Oil, nitroGLYCERIN , aspirin  EC, chlorthalidone , VITAMIN D PO, budesonide, triamcinolone  cream, clobetasol  cream, ALPRAZolam , metoprolol  succinate, atorvastatin , lisinopril , amLODipine , and busPIRone .  Meds ordered this encounter  Medications   diclofenac  Sodium (VOLTAREN ) 1 % GEL    Sig: Apply 2 g topically 4 (four) times daily.    Dispense:  150 g    Refill:  2    Supervising Provider:   Bambi Lever A [[id number]]

## 2023-11-30 ENCOUNTER — Telehealth: Payer: Self-pay | Admitting: Family Medicine

## 2023-11-30 NOTE — Telephone Encounter (Signed)
 Copied from CRM [id number]. Topic: General - Other >> Nov 30, 2023 12:00 PM [place] A wrote: Reason for CRM: Bertell Broach from [facility] called because they will be following patient for Palliative Care -[phone]

## 2023-11-30 NOTE — Telephone Encounter (Signed)
 noted

## 2023-12-01 ENCOUNTER — Telehealth: Payer: Self-pay | Admitting: Family Medicine

## 2023-12-01 NOTE — Telephone Encounter (Signed)
 Copied from CRM [id number]. Topic: Referral - Question >> Dec 01, 2023 11:55 AM Yolande Hench C wrote: Reason for CRM: Patient has asked if the PCP can do a referral to  [facility] (Nephrology) [address] [place], [place] [address] [phone] Please follow up with patient regarding referral request [phone]

## 2023-12-02 ENCOUNTER — Encounter: Payer: Self-pay | Admitting: Family Medicine

## 2023-12-02 DIAGNOSIS — N189 Chronic kidney disease, unspecified: Secondary | ICD-10-CM

## 2023-12-05 NOTE — Telephone Encounter (Signed)
 Placed referral for pt, notified via MyChart

## 2023-12-05 NOTE — Addendum Note (Signed)
 Addended by: Eustacio Ellen E on: 12/05/2023 09:01 AM   Modules accepted: Orders

## 2023-12-06 ENCOUNTER — Other Ambulatory Visit: Payer: Self-pay | Admitting: Family Medicine

## 2023-12-06 DIAGNOSIS — N3946 Mixed incontinence: Secondary | ICD-10-CM

## 2023-12-06 DIAGNOSIS — N1831 Chronic kidney disease, stage 3a: Secondary | ICD-10-CM

## 2023-12-06 NOTE — Telephone Encounter (Signed)
 Angel [name] states we need ov note documentation w pt dx for referal

## 2023-12-25 ENCOUNTER — Telehealth: Payer: Self-pay

## 2023-12-25 ENCOUNTER — Other Ambulatory Visit: Payer: Self-pay | Admitting: Family Medicine

## 2023-12-25 DIAGNOSIS — F418 Other specified anxiety disorders: Secondary | ICD-10-CM

## 2023-12-25 NOTE — Telephone Encounter (Signed)
 Per Chart, medication was already sent in today. Called and told [facility] this was sent and they confirmed they did get th rx but  now its saying refill too soon so its a 3rd party issue.

## 2023-12-25 NOTE — Telephone Encounter (Signed)
 Copied from CRM [id number]. Topic: Clinical - Medication Question >> Dec 25, 2023 12:19 PM Martinique E wrote: Reason for CRM: Bridgette Campus [facility] called in stating that they put in a refill request today for the patient's busPIRone  (BUSPAR ) 10 MG tablet and have not heard back from PCP. Relayed to [facility] that medication refills may take up to 3 business days, stated she will relay that information to the patient. >> Dec 25, 2023  1:03 PM Martinique E wrote: Patient now called back requesting if there is any possibility that this medication could be "expedited" to her provider. Relayed how the pharmacy just tried reordering it today 5/19 and those refills take up to 3 days. Patient would like to speak with a nurse about this medication.

## 2024-01-16 ENCOUNTER — Ambulatory Visit: Payer: Medicare Other | Attending: Internal Medicine

## 2024-01-16 DIAGNOSIS — I350 Nonrheumatic aortic (valve) stenosis: Secondary | ICD-10-CM | POA: Insufficient documentation

## 2024-01-17 LAB — ECHOCARDIOGRAM COMPLETE
AR max vel: 0.93 cm2
AV Area VTI: 0.88 cm2
AV Area mean vel: 0.92 cm2
AV Mean grad: 22 mmHg
AV Peak grad: 40.4 mmHg
Ao pk vel: 3.18 m/s
Area-P 1/2: 3.77 cm2
S' Lateral: 2.57 cm

## 2024-01-20 ENCOUNTER — Ambulatory Visit: Payer: Self-pay | Admitting: Internal Medicine

## 2024-01-22 NOTE — Telephone Encounter (Signed)
 Patient returned RN's call regarding results.

## 2024-01-24 ENCOUNTER — Ambulatory Visit: Attending: Internal Medicine | Admitting: Internal Medicine

## 2024-01-24 ENCOUNTER — Encounter: Payer: Self-pay | Admitting: Internal Medicine

## 2024-01-24 VITALS — BP 130/80 | HR 91 | Ht 63.0 in | Wt 183.0 lb

## 2024-01-24 DIAGNOSIS — I2510 Atherosclerotic heart disease of native coronary artery without angina pectoris: Secondary | ICD-10-CM

## 2024-01-24 DIAGNOSIS — I350 Nonrheumatic aortic (valve) stenosis: Secondary | ICD-10-CM | POA: Insufficient documentation

## 2024-01-24 DIAGNOSIS — I10 Essential (primary) hypertension: Secondary | ICD-10-CM | POA: Insufficient documentation

## 2024-01-24 DIAGNOSIS — I712 Thoracic aortic aneurysm, without rupture, unspecified: Secondary | ICD-10-CM

## 2024-01-24 DIAGNOSIS — E785 Hyperlipidemia, unspecified: Secondary | ICD-10-CM | POA: Insufficient documentation

## 2024-01-24 DIAGNOSIS — I25118 Atherosclerotic heart disease of native coronary artery with other forms of angina pectoris: Secondary | ICD-10-CM

## 2024-01-24 NOTE — Patient Instructions (Addendum)
 Medication Instructions:  Your physician recommends that you continue on your current medications as directed. Please refer to the Current Medication list given to you today.    *If you need a refill on your cardiac medications before your next appointment, please call your pharmacy*  Lab Work: No labs ordered today    Testing/Procedures: No test ordered today   Follow-Up: At [facility], you and your health needs are our priority.  As part of our continuing mission to provide you with exceptional heart care, our providers are all part of one team.  This team includes your primary Cardiologist (physician) and Advanced Practice Providers or APPs (Physician Assistants and Nurse Practitioners) who all work together to provide you with the care you need, when you need it.  Your next appointment:   3 month(s)  Provider:   You may see Sammy Crisp, MD or one of the following Advanced Practice Providers on your designated Care Team:   Laneta Pintos, NP Gildardo Labrador, PA-C Varney Gentleman, PA-C Cadence Gennaro Khat, PA-C Ronald Cockayne, NP Morey Ar, NP     Please monitor for the following symptoms: Chest pain Shortness of breath  Lightheadedness

## 2024-01-24 NOTE — Progress Notes (Signed)
  Cardiology Office Note:  .   Date:  01/24/2024  ID:  Angel [name], DOB [birth year], MRN [id number] PCP: Abram Abraham, NP-C  [facility] Providers Cardiologist:  Sammy Crisp, MD { Click to update primary MD,subspecialty MD or APP then REFRESH:1}    History of Present Illness: .   Angel [name] is a 89 y.o. female with history of poor R wave progression by EKG, abnormal myocardial perfusion stress test, and question of small apical infarct versus diverticulum by cardiac MRI, as well as aortic stenosis, hypertension, hyperlipidemia, and left pontine stroke (06/2020), who presents to discuss echocardiogram results.  I last saw her in December, at which time she noted sporadic pain in the chest that had been present for years.  Echocardiogram performed last week for follow-up of her previously mild to moderate aortic stenosis showed normal LVEF and progression of AS to moderate (approaching severe) stage.  Some stress from family issues.  Still occasional chest discomfort.  Happens once very 4-5 weeks.  Sometimes left-sided, sometimes central.  Does deep breathing and lasts a minute or less.  Not exertional.  No shortness of breath.  Occasional lightheadedness.  Had a few bouts of diarrhea over the last few weeks.  Had argument with grandson, attributes elevated BP to this.  Little intermittent edema.  Weight stable.  ROS: See HPI  Studies Reviewed: Angel [name]   EKG Interpretation Date/Time:  Wednesday January 24 2024 13:36:19 EDT Ventricular Rate:  91 PR Interval:  150 QRS Duration:  74 QT Interval:  390 QTC Calculation: 479 R Axis:   -9  Text Interpretation: Normal sinus rhythm Minimal voltage criteria for LVH, may be normal variant ( R in aVL ) Cannot rule out Anterior infarct (cited on or before 01-Sep-2021)versus lead placement When compared with ECG of 20-Jul-2023 11:01, No significant change was found Confirmed by Angel [name] ([id number]) on 01/24/2024 1:47:08 PM    TTE  (01/16/2024): Normal LV size with mild LVH.  LVEF 60-65% with grade 1 diastolic dysfunction and normal wall motion.  Normal RV size and function.  Mild left atrial enlargement.  No pericardial effusion.  Degenerative atrial valve.  Mild tricuspid regurgitation.  Calcified aortic valve with moderate stenosis (mean gradient 22 mmHg, AVA 0.9 cm, dimensionless index 0.27).  Normal CVP.  Risk Assessment/Calculations:   {Does this patient have ATRIAL FIBRILLATION?:[phone]}         Physical Exam:   VS:  BP 130/80 (Cuff Size: Normal)   Pulse 91   Ht 5' 3 (1.6 m)   Wt 183 lb (83 kg)   LMP  (LMP Unknown)   SpO2 94%   BMI 32.42 kg/m    Wt Readings from Last 3 Encounters:  01/24/24 183 lb (83 kg)  11/29/23 183 lb (83 kg)  07/20/23 187 lb (84.8 kg)    General:  NAD. Neck: No JVD or HJR. Lungs: Clear to auscultation bilaterally without wheezes or crackles. Heart: Regular rate and rhythm without murmurs, rubs, or gallops. Abdomen: Soft, nontender, nondistended. Extremities: No lower extremity edema.  ASSESSMENT AND PLAN: .    ***    {Are you ordering a CV Procedure (e.g. stress test, cath, DCCV, TEE, etc)?   Press F2        :[id number]}  Dispo: ***  Signed, Sammy Crisp, MD

## 2024-01-25 ENCOUNTER — Encounter: Payer: Self-pay | Admitting: Internal Medicine

## 2024-02-16 ENCOUNTER — Other Ambulatory Visit: Payer: Self-pay | Admitting: Family Medicine

## 2024-02-16 DIAGNOSIS — F418 Other specified anxiety disorders: Secondary | ICD-10-CM

## 2024-02-21 ENCOUNTER — Other Ambulatory Visit: Payer: Self-pay

## 2024-02-21 ENCOUNTER — Emergency Department
Admission: EM | Admit: 2024-02-21 | Discharge: 2024-02-21 | Disposition: A | Discharge status: Home / Self Care | Attending: Emergency Medicine | Admitting: Emergency Medicine

## 2024-02-21 ENCOUNTER — Ambulatory Visit

## 2024-02-21 ENCOUNTER — Emergency Department

## 2024-02-21 ENCOUNTER — Telehealth: Payer: Self-pay | Admitting: Internal Medicine

## 2024-02-21 DIAGNOSIS — L03116 Cellulitis of left lower limb: Secondary | ICD-10-CM | POA: Diagnosis not present

## 2024-02-21 DIAGNOSIS — I10 Essential (primary) hypertension: Secondary | ICD-10-CM | POA: Insufficient documentation

## 2024-02-21 DIAGNOSIS — M79605 Pain in left leg: Secondary | ICD-10-CM | POA: Diagnosis present

## 2024-02-21 DIAGNOSIS — I2510 Atherosclerotic heart disease of native coronary artery without angina pectoris: Secondary | ICD-10-CM | POA: Diagnosis not present

## 2024-02-21 LAB — CBC WITH DIFFERENTIAL/PLATELET
Abs Immature Granulocytes: 0.05 K/uL (ref 0.00–0.07)
Basophils Absolute: 0 K/uL (ref 0.0–0.1)
Basophils Relative: 1 %
Eosinophils Absolute: 0.2 K/uL (ref 0.0–0.5)
Eosinophils Relative: 3 %
HCT: 37.7 % (ref 36.0–46.0)
Hemoglobin: 12.3 g/dL (ref 12.0–15.0)
Immature Granulocytes: 1 %
Lymphocytes Relative: 14 %
Lymphs Abs: 1.1 K/uL (ref 0.7–4.0)
MCH: 31.9 pg (ref 26.0–34.0)
MCHC: 32.6 g/dL (ref 30.0–36.0)
MCV: 97.9 fL (ref 80.0–100.0)
Monocytes Absolute: 0.5 K/uL (ref 0.1–1.0)
Monocytes Relative: 7 %
Neutro Abs: 5.7 K/uL (ref 1.7–7.7)
Neutrophils Relative %: 74 %
Platelets: 266 K/uL (ref 150–400)
RBC: 3.85 MIL/uL — ABNORMAL LOW (ref 3.87–5.11)
RDW: 13 % (ref 11.5–15.5)
WBC: 7.6 K/uL (ref 4.0–10.5)
nRBC: 0 % (ref 0.0–0.2)

## 2024-02-21 LAB — COMPREHENSIVE METABOLIC PANEL WITH GFR
ALT: 22 U/L (ref 0–44)
AST: 27 U/L (ref 15–41)
Albumin: 3.6 g/dL (ref 3.5–5.0)
Alkaline Phosphatase: 45 U/L (ref 38–126)
Anion gap: 14 (ref 5–15)
BUN: 29 mg/dL — ABNORMAL HIGH (ref 8–23)
CO2: 24 mmol/L (ref 22–32)
Calcium: 9.2 mg/dL (ref 8.9–10.3)
Chloride: 105 mmol/L (ref 98–111)
Creatinine, Ser: 0.9 mg/dL (ref 0.44–1.00)
GFR, Estimated: >60 mL/min (ref >60)
Glucose, Bld: 146 mg/dL — ABNORMAL HIGH (ref 70–99)
Potassium: 3.3 mmol/L — ABNORMAL LOW (ref 3.5–5.1)
Sodium: 143 mmol/L (ref 135–145)
Total Bilirubin: 0.6 mg/dL (ref 0.0–1.2)
Total Protein: 7.1 g/dL (ref 6.5–8.1)

## 2024-02-21 MED ORDER — CEPHALEXIN 500 MG PO CAPS: 500.0000 mg | ORAL_CAPSULE | Freq: Three times a day (TID) | ORAL | 0 refills | Status: DC

## 2024-02-21 NOTE — ED Triage Notes (Signed)
 C/O left lower leg pain since Monday evening.  Anterior lower let reddened.  + DP/PT.  C/O pain to lower leg anterior and posterior.

## 2024-02-21 NOTE — ED Provider Notes (Signed)
 [facility] Provider Note    Event Date/Time   First MD Initiated Contact with Patient 02/21/24 1135     (approximate)   History   Chief Complaint: Leg Pain   HPI  Angel [name] is a 89 y.o. female with a history of prior stroke, hypertension who comes ED complaining of left leg pain and redness that has developed gradually over the last 2 days.  Started down just above the ankle and has spread up the calf.  No fevers or chills, no chest pain or shortness of breath.  Denies any known trauma.        Past Medical History:  Diagnosis Date   Acute CVA (cerebrovascular accident) (HCC) 06/28/2020   Anxiety    Aortic aneurysm without rupture (HCC)    Aortic stenosis    mild-moderate by echo   Arthritis    CAD (coronary artery disease)    Chronic hip pain    Endometriosis    Hearing loss    HTN, goal below 150/90    Hyperlipidemia LDL goal <70 11/18/2014   Osteopenia of the elderly    Urge and stress incontinence     Current Outpatient Rx   Order #: [id number] Class: Normal   Order #: [id number] Class: Normal   Order #: [id number] Class: Normal   Order #: [id number] Class: Historical Med   Order #: [id number] Class: OTC   Order #: [id number] Class: Normal   Order #: [id number] Class: Historical Med   Order #: [id number] Class: Normal   Order #: [id number] Class: Normal   Order #: [id number] Class: Historical Med   Order #: [id number] Class: Normal   Order #: [id number] Class: Normal   Order #: [id number] Class: Normal   Order #: [id number] Class: Historical Med   Order #: [id number] Class: Normal   Order #: [id number] Class: Historical Med   Order #: [id number] Class: Historical Med   Order #: [id number] Class: Historical Med   Order #: [id number] Class: Historical Med    Past Surgical History:  Procedure Laterality Date   ABDOMINAL HYSTERECTOMY  1976   total   APPENDECTOMY     BREAST EXCISIONAL BIOPSY Left 1987   benign   CATARACT EXTRACTION, BILATERAL  08/2018    Dr. Maree with [facility]    Physical Exam   Triage Vital Signs: ED Triage Vitals  Encounter Vitals Group     BP 02/21/24 1052 (!) 180/86     Girls Systolic BP Percentile --      Girls Diastolic BP Percentile --      Boys Systolic BP Percentile --      Boys Diastolic BP Percentile --      Pulse Rate 02/21/24 1052 99     Resp 02/21/24 1052 16     Temp 02/21/24 1052 98.1 F (36.7 C)     Temp Source 02/21/24 1052 Oral     SpO2 02/21/24 1052 94 %     Weight 02/21/24 1049 182 lb 15.7 oz (83 kg)     Height --      Head Circumference --      Peak Flow --      Pain Score 02/21/24 1049 3     Pain Loc --      Pain Education --      Exclude from Growth Chart --     Most recent vital signs: Vitals:   02/21/24 1118 02/21/24 1231  BP:  (!) 149/83  Pulse:  88  Resp:  18  Temp:    SpO2: 95% 100%  General: Awake, no distress.  CV:  Good peripheral perfusion.  Normal DP pulses Resp:  Normal effort.  Abd:  No distention.  Other:  Small scratch over the medial soft tissue at the distal left leg just above the ankle which appears to be nidus for skin infection.  He has erythema extending up to the tibial tuberosity without induration, crepitus, fluctuance, exudate.  Mild tenderness and mild warmth.  No palpable cord.  Symmetric calf circumference.  ED Results / Procedures / Treatments   Labs (all labs ordered are listed, but only abnormal results are displayed) Labs Reviewed  CBC WITH DIFFERENTIAL/PLATELET - Abnormal; Notable for the following components:      Result Value   RBC 3.85 (*)    All other components within normal limits  COMPREHENSIVE METABOLIC PANEL WITH GFR - Abnormal; Notable for the following components:   Potassium 3.3 (*)    Glucose, Bld 146 (*)    BUN 29 (*)    All other components within normal limits     EKG    RADIOLOGY Ultrasound venous left lower leg interpreted by me, negative for DVT.  Radiology report  reviewed   PROCEDURES:  Procedures   MEDICATIONS ORDERED IN ED: Medications - No data to display   IMPRESSION / MDM / ASSESSMENT AND PLAN / ED COURSE  I reviewed the triage vital signs and the nursing notes.  DDx: Left leg cellulitis, DVT, electrolyte derangement, renal failure  Patient's presentation is most consistent with acute presentation with potential threat to life or bodily function.  Patient presents with left leg redness and pain over the last few days.  Appears to have a mild cellulitis.  Discussed possibility of hospitalization with her, she would prefer to be discharged home to take oral antibiotics which I think is reasonable given her otherwise nontoxic appearance, reassuring exam, unremarkable vital signs.  Counseled her and her partner on close follow-up for reassessment and return precautions.   Clinical Course as of 02/21/24 1518  Wed Feb 21, 2024  1148 Due to technical issue, US  images unable to load and be viewable in PACS. Verbal report from radiology is that US  is neg. For DVT. [PS]    Clinical Course User Index [PS] Viviann Pastor, MD     FINAL CLINICAL IMPRESSION(S) / ED DIAGNOSES   Final diagnoses:  Left leg cellulitis     Rx / DC Orders   ED Discharge Orders          Ordered    cephALEXin  (KEFLEX ) 500 MG capsule  3 times daily        02/21/24 1150             Note:  This document was prepared using Dragon voice recognition software and may include unintentional dictation errors.   Viviann Pastor, MD 02/21/24 [phone]

## 2024-02-21 NOTE — Telephone Encounter (Signed)
 Pt c/o swelling/edema: STAT if pt has developed SOB within 24 hours  If swelling, where is the swelling located? Left LE from knee down to ankle  How much weight have you gained and in what time span? unsure  Have you gained 2 pounds in a day or 5 pounds in a week? unsure  Do you have a log of your daily weights (if so, list)? No  Are you currently taking a fluid pill? No  Are you currently SOB? No  Have you traveled recently in a car or plane for an extended period of time? No

## 2024-02-21 NOTE — ED Notes (Signed)
 US  called and advised that she could not electronically send report to be read but the pt was negative for DVT. Dr. Viviann made aware.

## 2024-02-21 NOTE — ED Notes (Signed)
To US via Wheelchair

## 2024-02-21 NOTE — Telephone Encounter (Signed)
 Called and spoke with the patient.  Patient reports developing edema since Monday evening (7/14) on the LLE from the knee below to above the ankles.  Patient was asked if the skin was warm to the touch and red in color. Patient confirmed both to be true. Patient was asked if she had been outside and possibly had an insect bite, which the patient denied.  At this time, with the patient still reporting swelling at the same location and still feeling warm to the touch and red in appearance, the patient was advised to go the emergency room to get a work up done for possible embolism.  Patient verbalized understanding.

## 2024-02-21 NOTE — ED Notes (Signed)
 Pt back from [place] at this time.

## 2024-02-22 ENCOUNTER — Ambulatory Visit: Admitting: Internal Medicine

## 2024-02-27 ENCOUNTER — Telehealth: Payer: Self-pay

## 2024-02-27 ENCOUNTER — Ambulatory Visit: Admitting: Family Medicine

## 2024-02-27 ENCOUNTER — Encounter: Payer: Self-pay | Admitting: Family Medicine

## 2024-02-27 VITALS — BP 120/74 | HR 85 | Temp 97.9°F | Ht 63.0 in | Wt 183.0 lb

## 2024-02-27 DIAGNOSIS — I872 Venous insufficiency (chronic) (peripheral): Secondary | ICD-10-CM | POA: Diagnosis not present

## 2024-02-27 DIAGNOSIS — L03116 Cellulitis of left lower limb: Secondary | ICD-10-CM

## 2024-02-27 NOTE — Progress Notes (Signed)
 Subjective:     Patient ID: Angel [name], female    DOB: 08/10/1930, 89 y.o.   MRN: [id number]  Chief Complaint  Patient presents with   Hospitalization Follow-up    Been doing fair since then. Left leg has gotten some better but still having some redness and warmth. Still on current keflex  medication     HPI  History of Present Illness         She is here for follow-up from her recent ED visit for cellulitis of her left lower leg. Negative DVT work up. Normal WBC count.   She is currently taking Keflex . Noticing some improvement.      Health Maintenance Due  Topic Date Due   Zoster Vaccines- Shingrix (2 of 2) 03/22/2018   COVID-19 Vaccine (7 - Pfizer risk 2024-25 season) 10/05/2023    Past Medical History:  Diagnosis Date   Acute CVA (cerebrovascular accident) (HCC) 06/28/2020   Anxiety    Aortic aneurysm without rupture (HCC)    Aortic stenosis    mild-moderate by echo   Arthritis    CAD (coronary artery disease)    Chronic hip pain    Endometriosis    Hearing loss    HTN, goal below 150/90    Hyperlipidemia LDL goal <70 11/18/2014   Osteopenia of the elderly    Urge and stress incontinence     Past Surgical History:  Procedure Laterality Date   ABDOMINAL HYSTERECTOMY  1976   total   APPENDECTOMY     BREAST EXCISIONAL BIOPSY Left 1987   benign   CATARACT EXTRACTION, BILATERAL  08/2018   Dr. 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  Tobacco Use   Smoking status: Never    Smokeless tobacco: Never  Vaping Use   Vaping status: Never Used  Substance and Sexual Activity   Alcohol use: Yes    Alcohol/week: 1.0 standard drink of alcohol    Types: 1 Cans of beer per week    Comment: once a month   Drug use: No   Sexual activity: Not on file  Other Topics Concern   Not on file  Social History Narrative   Not on file   Social Drivers of Health   Financial Resource Strain: Low Risk  (06/22/2023)   Overall Financial Resource Strain (CARDIA)    Difficulty of Paying Living Expenses: Not very hard  Food Insecurity: No Food Insecurity (06/22/2023)   Hunger Vital Sign    Worried About Running Out of Food in the Last Year: Never true    Ran Out of Food in the Last Year: Never true  Transportation Needs: No Transportation Needs (06/22/2023)   PRAPARE - Administrator, Civil Service (Medical): No    Lack of Transportation (Non-Medical): No  Physical Activity: Inactive (06/22/2023)   Exercise Vital Sign    Days of Exercise per Week: 0 days  Minutes of Exercise per Session: 0 min  Stress: No Stress Concern Present (06/22/2023)   [employer] of Occupational Health - Occupational Stress Questionnaire    Feeling of Stress : Only a little  Social Connections: Socially Isolated (06/22/2023)   Social Connection and Isolation Panel    Frequency of Communication with Friends and Family: More than three times a week    Frequency of Social Gatherings with Friends and Family: More than three times a week    Attends Religious Services: Never    Database administrator or Organizations: No    Attends Banker Meetings: Never    Marital Status: Widowed  Intimate Partner Violence: Patient Unable To Answer (06/22/2023)   Humiliation, Afraid, Rape, and Kick questionnaire    Fear of Current or Ex-Partner: Patient unable to answer    Emotionally Abused: Patient unable to answer    Physically Abused: Patient unable to answer    Sexually Abused:  Patient unable to answer    Outpatient Medications Prior to Visit  Medication Sig Dispense Refill   ALPRAZolam  (XANAX ) 0.25 MG tablet TAKE 1 TABLET BY MOUTH 2 TIMES DAILY AS NEEDED. 30 tablet 0   amLODipine  (NORVASC ) 5 MG tablet TAKE 1 TABLET (5 MG TOTAL) BY MOUTH DAILY. 90 tablet 2   Ascorbic Acid (VITAMIN C) 100 MG tablet Take 500 mg by mouth daily. Gummy 750mg      aspirin  EC 81 MG EC tablet Take 1 tablet (81 mg total) by mouth daily. Swallow whole. 30 tablet 11   atorvastatin  (LIPITOR) 80 MG tablet TAKE 1 TABLET BY MOUTH EVERY DAY 90 tablet 2   budesonide (RHINOCORT AQUA) 32 MCG/ACT nasal spray as needed.     busPIRone  (BUSPAR ) 10 MG tablet TAKE 1 TABLET BY MOUTH TWICE A DAY 180 tablet 0   cephALEXin  (KEFLEX ) 500 MG capsule Take 1 capsule (500 mg total) by mouth 3 (three) times daily for 10 days. 30 capsule 0   chlorthalidone  (HYGROTON ) 25 MG tablet TAKE 1/2 TABLET BY MOUTH EVERY DAY 45 tablet 3   clobetasol  cream (TEMOVATE ) 0.05 % Apply topically 2 (two) times daily.     diclofenac  Sodium (VOLTAREN ) 1 % GEL Apply 2 g topically 4 (four) times daily. 150 g 2   lisinopril  (ZESTRIL ) 40 MG tablet TAKE 1 TABLET BY MOUTH EVERY DAY 90 tablet 3   metoprolol  succinate (TOPROL -XL) 100 MG 24 hr tablet TAKE 1 TABLET BY MOUTH EVERY DAY WITH OR IMMEDIATELY FOLLOWING A MEAL 90 tablet 2   Multiple Vitamin (MULTIVITAMIN) capsule Take 1 capsule by mouth daily.     nitroGLYCERIN  (NITROSTAT ) 0.4 MG SL tablet Place 1 tablet (0.4 mg total) under the tongue every 5 (five) minutes as needed for chest pain. 25 tablet 3   Omega-3 Fatty Acids (FISH OIL) 1000 MG CAPS Take by mouth daily. Krill oil     triamcinolone  cream (KENALOG ) 0.1 % Apply topically.     vitamin B-12 (CYANOCOBALAMIN) 100 MCG tablet Take 1,000 mcg by mouth daily.      VITAMIN D PO Take 1 tablet by mouth daily.     No facility-administered medications prior to visit.    Allergies  Allergen Reactions   Codeine Other (See Comments)    zoned  out   Levofloxacin Other (See Comments)    AMS   Mirabegron  Other (See Comments)    Severe depression   Prednisone Other (See Comments)    AMS   Sulfa Antibiotics Other (See Comments)  AMS    Review of Systems  Constitutional:  Negative for chills and fever.  Respiratory:  Negative for shortness of breath.   Cardiovascular:  Positive for leg swelling. Negative for chest pain and palpitations.  Gastrointestinal:  Negative for abdominal pain, diarrhea, nausea and vomiting.  Musculoskeletal:  Negative for falls.  Neurological:  Negative for dizziness and focal weakness.       Objective:    Physical Exam Constitutional:      General: She is not in acute distress.    Appearance: She is not ill-appearing.  HENT:     Mouth/Throat:     Mouth: Mucous membranes are moist.     Pharynx: Oropharynx is clear.  Eyes:     Extraocular Movements: Extraocular movements intact.     Conjunctiva/sclera: Conjunctivae normal.  Cardiovascular:     Rate and Rhythm: Normal rate and regular rhythm.     Comments: Trace edema bilateral LEs Pulmonary:     Effort: Pulmonary effort is normal.     Breath sounds: Normal breath sounds.  Musculoskeletal:     Cervical back: Normal range of motion and neck supple.     Left lower leg: Tenderness present.     Comments: Scab noted to medial distal left lower leg with mild erythema from top of ankle to mid shin. No significant edema compared to right. LLE is neurovascularly intact.   Skin:    General: Skin is warm and dry.     Findings: Erythema present.  Neurological:     General: No focal deficit present.     Mental Status: She is alert and oriented to person, place, and time.     Motor: No weakness.     Coordination: Coordination normal.  Psychiatric:        Mood and Affect: Mood normal.        Behavior: Behavior normal.        Thought Content: Thought content normal.      BP 120/74 (BP Location: Left Arm, Patient Position: Sitting)   Pulse  85   Temp 97.9 F (36.6 C) (Temporal)   Ht 5' 3 (1.6 m)   Wt 183 lb (83 kg)   LMP  (LMP Unknown)   SpO2 98%   BMI 32.42 kg/m  Wt Readings from Last 3 Encounters:  02/27/24 183 lb (83 kg)  02/21/24 182 lb 15.7 oz (83 kg)  01/24/24 183 lb (83 kg)         Assessment & Plan:   Problem List Items Addressed This Visit   None Visit Diagnoses       Venous insufficiency of left lower extremity    -  Primary     Cellulitis of left leg          Reviewed notes and results from ED visit. Negative DVT  No sign of worsening today. No red flag symptoms.  Complete oral antibiotic prescribed by ED Start using clobetasol  bid and she has this at home.  Elevate LLE when sitting.  Follow up here Friday for recheck before the weekend or sooner if any new or worsening symptoms.    I am having Elveria MICAEL Maier maintain her vitamin B-12, vitamin C, multivitamin, Fish Oil, nitroGLYCERIN , aspirin  EC, chlorthalidone , VITAMIN D PO, budesonide, triamcinolone  cream, clobetasol  cream, ALPRAZolam , metoprolol  succinate, atorvastatin , lisinopril , amLODipine , diclofenac  Sodium, busPIRone , and cephALEXin .  No orders of the defined types were placed in this encounter.

## 2024-02-27 NOTE — Patient Instructions (Addendum)
 Use the clobetasol  topical medication twice daily.   Elevate your left leg when sitting.   Please follow up Friday with Corean Ku, NP    Please call regarding palliative care: [facility] Phone: [phone]

## 2024-02-27 NOTE — Telephone Encounter (Signed)
 Copied from CRM [id number]. Topic: Referral - Question >> Feb 27, 2024 11:46 AM Chasity T wrote:  Reason for CRM: Megan daughter in law of patient is calling in regarding a referral discussion with Dr Lendia. She is wanting to provide the fax number for the [facility] .   Fax: [phone]  Please contact megan once referral has been sent. [phone]

## 2024-02-27 NOTE — Telephone Encounter (Signed)
 It looks like referral was already sent? Was there something different discussed at visit?

## 2024-02-29 NOTE — Telephone Encounter (Signed)
 LM for Megan notifying referral was already sent and they should be reaching out shortly

## 2024-03-01 ENCOUNTER — Encounter: Payer: Self-pay | Admitting: Family Medicine

## 2024-03-01 ENCOUNTER — Ambulatory Visit (INDEPENDENT_AMBULATORY_CARE_PROVIDER_SITE_OTHER): Admitting: Family Medicine

## 2024-03-01 VITALS — BP 126/84 | HR 90 | Temp 97.8°F | Ht 63.0 in | Wt 183.0 lb

## 2024-03-01 DIAGNOSIS — E782 Mixed hyperlipidemia: Secondary | ICD-10-CM | POA: Diagnosis not present

## 2024-03-01 DIAGNOSIS — M2550 Pain in unspecified joint: Secondary | ICD-10-CM

## 2024-03-01 DIAGNOSIS — F411 Generalized anxiety disorder: Secondary | ICD-10-CM

## 2024-03-01 DIAGNOSIS — F330 Major depressive disorder, recurrent, mild: Secondary | ICD-10-CM

## 2024-03-01 DIAGNOSIS — R600 Localized edema: Secondary | ICD-10-CM

## 2024-03-01 MED ORDER — DULOXETINE HCL 20 MG PO CPEP: 20.0000 mg | ORAL_CAPSULE | Freq: Every day | ORAL | 1 refills | Status: DC

## 2024-03-01 NOTE — Patient Instructions (Addendum)
 Cymbalta 20mg  once daily in the evenings.  Follow up with me in one month for evaluation of medication effectiveness.  May stop buspirone  next week once you are taking cymbalta.  We will talk about changing your atorvastatin  to rosuvastatin to help with joint pain at your next visit.

## 2024-03-01 NOTE — Progress Notes (Signed)
 Acute Office Visit  Subjective:     Patient ID: Angel [name], female    DOB: [birth year], 89 y.o.   MRN: [id number]  Chief Complaint  Patient presents with   Follow-up    Left leg states feels like it is doing better, just wanted to check in. Not as red and swollen as last visit. Finished abx yesterday    HPI  Discussed the use of AI scribe software for clinical note transcription with the patient, who gave verbal consent to proceed.  History of Present Illness Angel [name] is a 89 year old female who presents for follow-up on leg swelling and discussion of anxiety management.  Lower extremity edema and dermatitis - Bilateral leg swelling initially significant, now improved after completing a course of Cefalexin - Redness has decreased - Persistent itching present - No major pain or fever  Anxiety symptoms - Anxiety remains high and sometimes overwhelming, particularly after stressful events - Cautious about using Xanax  due to concerns about addiction - Practices meditation and deep breathing for symptom management - Buspirone  taken twice daily has been ineffective, including after dose increase - Interested in exploring alternative medication options  Arthralgia and impaired mobility - Significant joint pain impairs mobility, making it difficult to get out of bed or rise from a chair - Has tried orthotics from [facility] for pes planus but has not continued use due to emotional issues - Considering other options for arthritis and joint pain management  Hyperlipidemia - On atorvastatin  for approximately one year for cholesterol management - LDL cholesterol remains higher than HDL     ROS Per HPI      Objective:    BP 126/84 (BP Location: Left Arm, Patient Position: Sitting)   Pulse 90   Temp 97.8 F (36.6 C) (Temporal)   Ht 5' 3 (1.6 m)   Wt 183 lb (83 kg)   LMP  (LMP Unknown)   SpO2 98%   BMI 32.42 kg/m    Physical Exam Vitals and nursing note  reviewed.  Constitutional:      General: She is not in acute distress.    Appearance: She is obese.     Comments: Elderly   HENT:     Head: Normocephalic and atraumatic.     Right Ear: External ear normal.     Left Ear: External ear normal.     Nose: Nose normal.     Mouth/Throat:     Mouth: Mucous membranes are moist.     Pharynx: Oropharynx is clear.  Eyes:     Extraocular Movements: Extraocular movements intact.     Pupils: Pupils are equal, round, and reactive to light.  Cardiovascular:     Rate and Rhythm: Normal rate and regular rhythm.     Pulses: Normal pulses.     Heart sounds: Murmur heard.  Pulmonary:     Effort: Pulmonary effort is normal. No respiratory distress.     Breath sounds: Normal breath sounds. No wheezing, rhonchi or rales.  Musculoskeletal:        General: Normal range of motion.     Cervical back: Normal range of motion.     Right lower leg: Edema (+1) present.     Left lower leg: Edema (+1) present.  Lymphadenopathy:     Cervical: No cervical adenopathy.  Skin:    General: Skin is warm and dry.  Neurological:     General: No focal deficit present.     Mental Status: She  is alert and oriented to person, place, and time.  Psychiatric:        Mood and Affect: Mood normal.        Thought Content: Thought content normal.     No results found for any visits on 03/01/24.      Assessment & Plan:   Assessment and Plan Assessment & Plan Cellulitis/Lower leg swelling Improvement noted. No further antibiotics required. - Return if symptoms worsen.  Anxiety/MDD Chronic anxiety with ineffective buspirone . Discussed Xanax  for acute episodes and Cymbalta  for anxiety and mood. - Prescribe Cymbalta  (duloxetine ) once in the evening. - Continue Xanax  as needed for acute anxiety, up to 3-4 times a week. - Discontinue buspirone  after starting Cymbalta . - Follow up in one month to assess effectiveness of Cymbalta .  Arthritis Chronic arthritis with  significant pain. Discussed Cymbalta  for pain management. - Monitor response to Cymbalta  for joint pain improvement. - Research and consider alternative arthritis treatments, including potential consultation.  Hyperlipidemia Long-term atorvastatin  use. Potential joint pain contribution considered. - Evaluate atorvastatin  use if joint pain persists after Cymbalta  trial.     No orders of the defined types were placed in this encounter.    Meds ordered this encounter  Medications   DULoxetine  (CYMBALTA ) 20 MG capsule    Sig: Take 1 capsule (20 mg total) by mouth daily.    Dispense:  30 capsule    Refill:  1    Return in about 4 weeks (around 03/29/2024) for meds.  Angel LITTIE Ku, FNP

## 2024-03-05 DIAGNOSIS — F411 Generalized anxiety disorder: Secondary | ICD-10-CM | POA: Insufficient documentation

## 2024-03-05 DIAGNOSIS — F330 Major depressive disorder, recurrent, mild: Secondary | ICD-10-CM | POA: Insufficient documentation

## 2024-03-05 DIAGNOSIS — R600 Localized edema: Secondary | ICD-10-CM | POA: Insufficient documentation

## 2024-03-05 DIAGNOSIS — M2550 Pain in unspecified joint: Secondary | ICD-10-CM | POA: Insufficient documentation

## 2024-03-05 DIAGNOSIS — E782 Mixed hyperlipidemia: Secondary | ICD-10-CM | POA: Insufficient documentation

## 2024-03-10 ENCOUNTER — Encounter: Payer: Self-pay | Admitting: Family Medicine

## 2024-03-13 ENCOUNTER — Telehealth: Payer: Self-pay | Admitting: Family Medicine

## 2024-03-13 NOTE — Telephone Encounter (Signed)
 Copied from CRM [id number]. Topic: General - Other >> Mar 13, 2024 12:12 PM Angel [name] wrote: Reason for CRM: Patient called.. wouldn't relay reason.. Would like a call back from [facility].. >> Mar 13, 2024  2:48 PM Angel [name] wrote: Patient is calling in again regarding speaking to [facility], she stated that it is very urgent. Patient would not relay the reason but stated it will only take a few minutes of her time. Please contact the patient

## 2024-03-13 NOTE — Telephone Encounter (Signed)
 Called pt and she would like a letter emailed to the [employer] from [place] stating that she would like discontinuation from their program.   I was able to write up the letter, [name] signed.   Email with the letter was successfully faxed over to [facility] over at the [employer].   Called and notified pt, pt was grateful

## 2024-03-13 NOTE — Telephone Encounter (Signed)
 Copied from CRM [id number]. Topic: General - Other >> Mar 13, 2024 12:12 PM [name] wrote: Reason for CRM: Patient called.. wouldn't relay reason.. Would like a call back from [facility].Angel [name]

## 2024-03-24 ENCOUNTER — Other Ambulatory Visit: Payer: Self-pay | Admitting: Family Medicine

## 2024-03-24 DIAGNOSIS — F330 Major depressive disorder, recurrent, mild: Secondary | ICD-10-CM

## 2024-03-24 DIAGNOSIS — F411 Generalized anxiety disorder: Secondary | ICD-10-CM

## 2024-04-02 ENCOUNTER — Telehealth: Payer: Self-pay

## 2024-04-02 NOTE — Telephone Encounter (Signed)
 Copied from CRM [id number]. Topic: Clinical - Medication Question >> Apr 02, 2024  9:13 AM Chasity T wrote: Reason for CRM: Alex from [facility] is calling to confirm that the medication DULoxetine  (CYMBALTA ) 20 MG capsule was discontinued from the patient by doctor. Pt called pharmacy to get an refund on medication because she stated that the dr advised her not tot take it anymore but I still see it on her medication list.   Please contact pharmacy to confirm with them. Call back number is [phone] (alex)

## 2024-04-02 NOTE — Telephone Encounter (Signed)
 Called and spoke w pharmacy staff advising pt had side effects from medication so pt discontinued taking it. We have taken it off her medication list. Marolyn states he will let pt know and refund.

## 2024-04-26 ENCOUNTER — Ambulatory Visit: Attending: Internal Medicine | Admitting: Internal Medicine

## 2024-04-26 ENCOUNTER — Encounter: Payer: Self-pay | Admitting: Internal Medicine

## 2024-04-26 VITALS — BP 115/65 | HR 80 | Ht 64.0 in | Wt 187.0 lb

## 2024-04-26 DIAGNOSIS — E785 Hyperlipidemia, unspecified: Secondary | ICD-10-CM | POA: Diagnosis present

## 2024-04-26 DIAGNOSIS — I25118 Atherosclerotic heart disease of native coronary artery with other forms of angina pectoris: Secondary | ICD-10-CM | POA: Diagnosis present

## 2024-04-26 DIAGNOSIS — I350 Nonrheumatic aortic (valve) stenosis: Secondary | ICD-10-CM | POA: Diagnosis present

## 2024-04-26 DIAGNOSIS — I10 Essential (primary) hypertension: Secondary | ICD-10-CM | POA: Diagnosis present

## 2024-04-26 MED ORDER — AMLODIPINE BESYLATE 2.5 MG PO TABS: 2.5000 mg | ORAL_TABLET | Freq: Every day | ORAL | 3 refills | Status: DC

## 2024-04-26 NOTE — Patient Instructions (Signed)
 Medication Instructions:  Your physician recommends the following medication changes.  DECREASE: Amlodipine  to 2.5 mg by mouth daily    *If you need a refill on your cardiac medications before your next appointment, please call your pharmacy*  Lab Work: No labs ordered today    Testing/Procedures: Your physician has requested that you have a limited echocardiogram prior to 3 month follow. Echocardiography is a painless test that uses sound waves to create images of your heart. It provides your doctor with information about the size and shape of your heart and how well your heart's chambers and valves are working.   You may receive an ultrasound enhancing agent through an IV if needed to better visualize your heart during the echo. This procedure takes approximately one hour.  There are no restrictions for this procedure.  This will take place at [address] [facility], [place] [address]  Please note: We ask at that you not bring children with you during ultrasound (echo/ vascular) testing. Due to room size and safety concerns, children are not allowed in the ultrasound rooms during exams. Our front office staff cannot provide observation of children in our lobby area while testing is being conducted. An adult accompanying a patient to their appointment will only be allowed in the ultrasound room at the discretion of the ultrasound technician under special circumstances. We apologize for any inconvenience.   Follow-Up: At [facility], you and your health needs are our priority.  As part of our continuing mission to provide you with exceptional heart care, our providers are all part of one team.  This team includes your primary Cardiologist (physician) and Advanced Practice Providers or APPs (Physician Assistants and Nurse Practitioners) who all work together to provide you with the care you need, when you need it.  Your next appointment:   3  month(s)  Provider:   You may see Lonni Hanson, MD or one of the following Advanced Practice Providers on your designated Care Team:   Lonni Meager, NP Lesley Maffucci, PA-C Bernardino Bring, PA-C Cadence Powell, PA-C Tylene Lunch, NP Barnie Hila, NP

## 2024-04-26 NOTE — Progress Notes (Signed)
 Cardiology Office Note:  .   Date:  04/26/2024  ID:  Zehra, Rucci 1931/07/28, MRN [id number] PCP: Lendia Boby CROME, NP-C  [facility] Providers Cardiologist:  Lonni Hanson, MD     History of Present Illness: .   Angel [name] is a 89 y.o. female with history of poor R wave progression by EKG, abnormal myocardial perfusion stress test, and question of small apical infarct versus diverticulum by cardiac MRI, as well as aortic stenosis, hypertension, hyperlipidemia, and left pontine stroke (06/2020), who presents for follow-up of aortic stenosis.  I last saw her in June.  Today comes to open reports that she feels about the same as at prior visits.  She is primarily bothered by arthritis but remains as active as possible around her home.  She is doing some chair yoga and walking short distances.  She notes that she gets out of breath when she has to hurry; this has been unchanged for at least a year.  She reports rare episodes of chest pain at night that she describes as twinges that only last a couple of seconds.  She does not have any exertional chest pain.  She recounts a single episode of lightheadedness that occurred while cooking about 3 weeks ago.  She had to sit down because she felt like she was going to faint.  She went up sleeping much of the rest of the day but felt fine the next day.  She has not had any other episodes of lightheadedness.  She denies palpitations.  Chronic right lower extremity edema is unchanged from baseline.  ROS: See HPI  Studies Reviewed: .       Risk Assessment/Calculations:             Physical Exam:   VS:  BP 115/65 (BP Location: Left Arm, Patient Position: Sitting, Cuff Size: Large)   Pulse 80   Ht 5' 4 (1.626 m)   Wt 187 lb (84.8 kg)   LMP  (LMP Unknown)   SpO2 96%   BMI 32.10 kg/m    Wt Readings from Last 3 Encounters:  04/26/24 187 lb (84.8 kg)  03/01/24 183 lb (83 kg)  02/27/24 183 lb (83 kg)    General:  NAD. Neck: No  JVD or HJR. Lungs: Clear to auscultation bilaterally without wheezes or crackles. Heart: Regular rate and rhythm with 2/6 systolic murmur. Abdomen: Soft, nontender, nondistended. Extremities: Trace pretibial edema bilaterally.  ASSESSMENT AND PLAN: .    Aortic stenosis: Most recent echo showed moderate to severe aortic stenosis in June.  [place] notes some exertional dyspnea and fatigue though overall this has been stable.  At age 22, it is certainly possible that generalized deconditioning due to limited mobility from her arthritis is contributing to this.  Nonetheless, we will plan to repeat a limited echo shortly before our next visit in 3 months to assess for progression of her AS.  I asked her to alert us  if she has any significant progression of her symptoms in the meantime.  We will also reduce amlodipine  to 2.5 mg daily given her low normal blood pressure today and report of lightheadedness a few weeks ago.  Hypertension: Blood pressure well-controlled today.  Reduce amlodipine , as above, to minimize the risk for symptomatic hypotension.  Continue current doses of chlorthalidone  and lisinopril .  Coronary artery disease and hyperlipidemia: No angina reported.  Continue metoprolol  and amlodipine  for antianginal therapy, albeit with reduced dose of amlodipine .  If AAS worsens, will  need to consider right and left heart catheterization in anticipation of structural heart evaluation.  Continue aspirin  and atorvastatin .    Dispo: Return to clinic in 3 months with echo shortly before visit to reassess AS.  Signed, Lonni Hanson, MD

## 2024-04-30 ENCOUNTER — Ambulatory Visit: Admitting: Physician Assistant

## 2024-05-12 ENCOUNTER — Other Ambulatory Visit: Payer: Self-pay | Admitting: Internal Medicine

## 2024-05-13 ENCOUNTER — Other Ambulatory Visit: Payer: Self-pay | Admitting: Internal Medicine

## 2024-05-13 ENCOUNTER — Ambulatory Visit: Admitting: Physician Assistant

## 2024-05-14 ENCOUNTER — Other Ambulatory Visit: Payer: Self-pay | Admitting: Family Medicine

## 2024-05-14 ENCOUNTER — Encounter: Payer: Self-pay | Admitting: Family Medicine

## 2024-05-14 MED ORDER — PREDNISONE 5 MG PO TABS: 5.0000 mg | ORAL_TABLET | Freq: Every day | ORAL | 0 refills | Status: DC

## 2024-05-17 ENCOUNTER — Ambulatory Visit: Admitting: Nurse Practitioner

## 2024-05-17 ENCOUNTER — Other Ambulatory Visit: Payer: Self-pay | Admitting: Internal Medicine

## 2024-05-17 DIAGNOSIS — E785 Hyperlipidemia, unspecified: Secondary | ICD-10-CM

## 2024-05-17 NOTE — Progress Notes (Deleted)
 Cardiology Office Note    Date:  05/17/2024   ID:  Kawehi, Hostetter November 30, 1930, MRN [id number]  PCP:  Lendia Boby CROME, NP-C  Cardiologist:  Lonni Hanson, MD  Electrophysiologist:  None   Chief Complaint: ***  History of Present Illness:   Angel [name] is a 89 y.o. female with history of poor R wave progression by EKG, abnormal myocardial perfusion stress test, and question of small apical infarct versus diverticulum by cardiac MRI, aortic stenosis, hypertension, hyperlipidemia, and left pontine stroke (06/2020) who presents for follow-up on***.    {There is no content from the last Narrative History section.}    ***  Labs independently reviewed: ***  Objective   Past Medical History:  Diagnosis Date   Acute CVA (cerebrovascular accident) (HCC) 06/28/2020   Anxiety    Aortic aneurysm without rupture    Aortic stenosis    mild-moderate by echo   Arthritis    CAD (coronary artery disease)    Chronic hip pain    Endometriosis    Hearing loss    HTN, goal below 150/90    Hyperlipidemia LDL goal <70 11/18/2014   Osteopenia of the elderly    Urge and stress incontinence     Current Medications: No outpatient medications have been marked as taking for the 05/17/24 encounter (Appointment) with Vivienne Lonni Ingle, NP.    Allergies:   Codeine, Levofloxacin, Mirabegron , Prednisone, and Sulfa antibiotics   Social History   Socioeconomic History   Marital status: Significant Other    Spouse name: Not on file   Number of children: 3   Years of education: Not on file   Highest education level: Master's degree (e.g., MA, MS, MEng, MEd, MSW, MBA)  Occupational History   Occupation: Owner of a wedding venue  Tobacco Use   Smoking status: Never   Smokeless tobacco: Never  Vaping Use   Vaping status: Never Used  Substance and Sexual Activity   Alcohol use: Yes    Alcohol/week: 1.0 standard drink of alcohol    Types: 1 Cans of beer per week     Comment: once a month   Drug use: No   Sexual activity: Not on file  Other Topics Concern   Not on file  Social History Narrative   Not on file   Social Drivers of Health   Financial Resource Strain: Low Risk  (06/22/2023)   Overall Financial Resource Strain (CARDIA)    Difficulty of Paying Living Expenses: Not very hard  Food Insecurity: No Food Insecurity (06/22/2023)   Hunger Vital Sign    Worried About Running Out of Food in the Last Year: Never true    Ran Out of Food in the Last Year: Never true  Transportation Needs: No Transportation Needs (06/22/2023)   PRAPARE - Administrator, Civil Service (Medical): No    Lack of Transportation (Non-Medical): No  Physical Activity: Inactive (06/22/2023)   Exercise Vital Sign    Days of Exercise per Week: 0 days    Minutes of Exercise per Session: 0 min  Stress: No Stress Concern Present (06/22/2023)   [employer] of Occupational Health - Occupational Stress Questionnaire    Feeling of Stress : Only a little  Social Connections: Socially Isolated (06/22/2023)   Social Connection and Isolation Panel    Frequency of Communication with Friends and Family: More than three times a week    Frequency of Social Gatherings with Friends and Family: More than three  times a week    Attends Religious Services: Never    Active Member of Clubs or Organizations: No    Attends Banker Meetings: Never    Marital Status: Widowed     Family History:  The patient's family history includes Breast cancer (age of onset: 69) in her maternal grandmother; Cancer in her brother; Heart disease in her maternal grandmother; Stroke in her mother.  ROS:   12-point review of systems is negative unless otherwise noted in the HPI.  EKGs/Other Studies Reviewed:    Studies reviewed were summarized above. The additional studies were reviewed today: ***  EKG:  EKG personally reviewed by me today    PHYSICAL EXAM:    VS:  LMP   (LMP Unknown)   BMI: There is no height or weight on file to calculate BMI.  GEN: Well nourished, well developed in no acute distress NECK: No JVD; No carotid bruits CARDIAC: ***RRR, no murmurs, rubs, gallops RESPIRATORY:  Clear to auscultation without rales, wheezing or rhonchi  ABDOMEN: Soft, non-tender, non-distended EXTREMITIES:  *** No edema; No deformity  Wt Readings from Last 3 Encounters:  04/26/24 187 lb (84.8 kg)  03/01/24 183 lb (83 kg)  02/27/24 183 lb (83 kg)                  ASSESSMENT & PLAN:   ***   {Are you ordering a CV Procedure (e.g. stress test, cath, DCCV, TEE, etc)?   Press F2        :[id number]}   Disposition: F/u with Dr. Mady or an APP in ***.   Medication Adjustments/Labs and Tests Ordered: Current medicines are reviewed at length with the patient today.  Concerns regarding medicines are outlined above. Medication changes, Labs and Tests ordered today are summarized above and listed in the Patient Instructions accessible in Encounters.   Signed, Angel Maffucci, PA-C 05/17/2024 11:28 AM     [facility] [address] [place], [place] [address] [phone]

## 2024-05-20 ENCOUNTER — Observation Stay: Admission: EM | Admit: 2024-05-20 | Discharge: 2024-05-22 | Attending: Internal Medicine | Admitting: Internal Medicine

## 2024-05-20 ENCOUNTER — Other Ambulatory Visit: Payer: Self-pay | Admitting: Family Medicine

## 2024-05-20 ENCOUNTER — Other Ambulatory Visit: Payer: Self-pay | Admitting: Internal Medicine

## 2024-05-20 ENCOUNTER — Emergency Department

## 2024-05-20 ENCOUNTER — Other Ambulatory Visit: Payer: Self-pay

## 2024-05-20 ENCOUNTER — Telehealth: Payer: Self-pay

## 2024-05-20 DIAGNOSIS — I214 Non-ST elevation (NSTEMI) myocardial infarction: Principal | ICD-10-CM | POA: Insufficient documentation

## 2024-05-20 DIAGNOSIS — I160 Hypertensive urgency: Secondary | ICD-10-CM | POA: Insufficient documentation

## 2024-05-20 DIAGNOSIS — I2510 Atherosclerotic heart disease of native coronary artery without angina pectoris: Secondary | ICD-10-CM | POA: Insufficient documentation

## 2024-05-20 DIAGNOSIS — Z8673 Personal history of transient ischemic attack (TIA), and cerebral infarction without residual deficits: Secondary | ICD-10-CM | POA: Diagnosis not present

## 2024-05-20 DIAGNOSIS — E876 Hypokalemia: Secondary | ICD-10-CM | POA: Diagnosis not present

## 2024-05-20 DIAGNOSIS — I10 Essential (primary) hypertension: Secondary | ICD-10-CM | POA: Diagnosis not present

## 2024-05-20 DIAGNOSIS — R079 Chest pain, unspecified: Secondary | ICD-10-CM | POA: Diagnosis present

## 2024-05-20 DIAGNOSIS — I350 Nonrheumatic aortic (valve) stenosis: Secondary | ICD-10-CM | POA: Insufficient documentation

## 2024-05-20 DIAGNOSIS — F418 Other specified anxiety disorders: Secondary | ICD-10-CM

## 2024-05-20 DIAGNOSIS — M26623 Arthralgia of bilateral temporomandibular joint: Secondary | ICD-10-CM

## 2024-05-20 LAB — BASIC METABOLIC PANEL WITH GFR
Anion gap: 14 (ref 5–15)
BUN: 38 mg/dL — ABNORMAL HIGH (ref 8–23)
CO2: 21 mmol/L — ABNORMAL LOW (ref 22–32)
Calcium: 9.3 mg/dL (ref 8.9–10.3)
Chloride: 102 mmol/L (ref 98–111)
Creatinine, Ser: 0.93 mg/dL (ref 0.44–1.00)
GFR, Estimated: 58 mL/min — ABNORMAL LOW (ref >60)
Glucose, Bld: 146 mg/dL — ABNORMAL HIGH (ref 70–99)
Potassium: 3.3 mmol/L — ABNORMAL LOW (ref 3.5–5.1)
Sodium: 137 mmol/L (ref 135–145)

## 2024-05-20 LAB — CBC
HCT: 37.7 % (ref 36.0–46.0)
Hemoglobin: 12.8 g/dL (ref 12.0–15.0)
MCH: 32.6 pg (ref 26.0–34.0)
MCHC: 34 g/dL (ref 30.0–36.0)
MCV: 95.9 fL (ref 80.0–100.0)
Platelets: 259 K/uL (ref 150–400)
RBC: 3.93 MIL/uL (ref 3.87–5.11)
RDW: 12.6 % (ref 11.5–15.5)
WBC: 9 K/uL (ref 4.0–10.5)
nRBC: 0 % (ref 0.0–0.2)

## 2024-05-20 LAB — TROPONIN I (HIGH SENSITIVITY): Troponin I (High Sensitivity): 21 ng/L — ABNORMAL HIGH (ref <18)

## 2024-05-20 NOTE — ED Notes (Addendum)
 [name]

## 2024-05-20 NOTE — Telephone Encounter (Signed)
 Copied from CRM [id number]. Topic: General - Inquiry >> May 20, 2024 11:44 AM Laymon HERO wrote: Reason for CRM: Patient needing DNR form and gold form to be filled out. Please contact patient

## 2024-05-20 NOTE — ED Notes (Signed)
 Patient transported to X-ray

## 2024-05-20 NOTE — ED Triage Notes (Addendum)
 Pt reports chest pain and bilateral jaw pain that began around 1900 tonight. Pt reports she has an aortic valve issue that she follows cardiology for. Pt reports she also feels anxious

## 2024-05-21 ENCOUNTER — Encounter: Payer: Self-pay | Admitting: Internal Medicine

## 2024-05-21 ENCOUNTER — Observation Stay (HOSPITAL_BASED_OUTPATIENT_CLINIC_OR_DEPARTMENT_OTHER)
Admit: 2024-05-21 | Discharge: 2024-05-21 | Disposition: A | Discharge status: Home / Self Care | Attending: Internal Medicine | Admitting: Internal Medicine

## 2024-05-21 ENCOUNTER — Encounter
Admission: EM | Disposition: A | Discharge status: Other Acute Inpatient Hospital | Payer: Self-pay | Source: Home / Self Care | Attending: Emergency Medicine

## 2024-05-21 ENCOUNTER — Encounter (HOSPITAL_COMMUNITY): Payer: Self-pay

## 2024-05-21 ENCOUNTER — Emergency Department

## 2024-05-21 DIAGNOSIS — I160 Hypertensive urgency: Secondary | ICD-10-CM | POA: Diagnosis not present

## 2024-05-21 DIAGNOSIS — I214 Non-ST elevation (NSTEMI) myocardial infarction: Principal | ICD-10-CM

## 2024-05-21 DIAGNOSIS — Z8673 Personal history of transient ischemic attack (TIA), and cerebral infarction without residual deficits: Secondary | ICD-10-CM

## 2024-05-21 DIAGNOSIS — I350 Nonrheumatic aortic (valve) stenosis: Secondary | ICD-10-CM | POA: Diagnosis not present

## 2024-05-21 DIAGNOSIS — I10 Essential (primary) hypertension: Secondary | ICD-10-CM

## 2024-05-21 DIAGNOSIS — I2510 Atherosclerotic heart disease of native coronary artery without angina pectoris: Secondary | ICD-10-CM

## 2024-05-21 DIAGNOSIS — E876 Hypokalemia: Secondary | ICD-10-CM

## 2024-05-21 HISTORY — PX: RIGHT/LEFT HEART CATH AND CORONARY ANGIOGRAPHY: CATH118266

## 2024-05-21 LAB — POCT I-STAT EG7
Acid-Base Excess: 0 mmol/L (ref 0.0–2.0)
Bicarbonate: 24.5 mmol/L (ref 20.0–28.0)
Calcium, Ion: 1.15 mmol/L (ref 1.15–1.40)
HCT: 37 % (ref 36.0–46.0)
Hemoglobin: 12.6 g/dL (ref 12.0–15.0)
O2 Saturation: 69 %
Potassium: 3.3 mmol/L — ABNORMAL LOW (ref 3.5–5.1)
Sodium: 137 mmol/L (ref 135–145)
TCO2: 26 mmol/L (ref 22–32)
pCO2, Ven: 38 mmHg — ABNORMAL LOW (ref 44–60)
pH, Ven: 7.418 (ref 7.25–7.43)
pO2, Ven: 35 mmHg (ref 32–45)

## 2024-05-21 LAB — PROTIME-INR
INR: 1 (ref 0.8–1.2)
Prothrombin Time: 13.3 s (ref 11.4–15.2)

## 2024-05-21 LAB — POCT I-STAT 7, (LYTES, BLD GAS, ICA,H+H)
Acid-Base Excess: 2 mmol/L (ref 0.0–2.0)
Bicarbonate: 24.7 mmol/L (ref 20.0–28.0)
Calcium, Ion: 1.2 mmol/L (ref 1.15–1.40)
HCT: 38 % (ref 36.0–46.0)
Hemoglobin: 12.9 g/dL (ref 12.0–15.0)
O2 Saturation: 96 %
Potassium: 3.5 mmol/L (ref 3.5–5.1)
Sodium: 141 mmol/L (ref 135–145)
TCO2: 26 mmol/L (ref 22–32)
pCO2 arterial: 32.9 mmHg (ref 32–48)
pH, Arterial: 7.483 — ABNORMAL HIGH (ref 7.35–7.45)
pO2, Arterial: 76 mmHg — ABNORMAL LOW (ref 83–108)

## 2024-05-21 LAB — APTT: aPTT: 31 s (ref 24–36)

## 2024-05-21 LAB — TROPONIN I (HIGH SENSITIVITY)
Troponin I (High Sensitivity): 467 ng/L (ref <18)
Troponin I (High Sensitivity): 562 ng/L (ref <18)

## 2024-05-21 SURGERY — RIGHT/LEFT HEART CATH AND CORONARY ANGIOGRAPHY
Anesthesia: Moderate Sedation

## 2024-05-21 MED ORDER — HEPARIN (PORCINE) 25000 UT/250ML-% IV SOLN
950.0000 [IU]/h | INTRAVENOUS | Status: DC
  Administered 2024-05-21: 950 [IU]/h via INTRAVENOUS

## 2024-05-21 MED ORDER — NITROGLYCERIN 0.4 MG SL SUBL: 0.4000 mg | SUBLINGUAL_TABLET | SUBLINGUAL | Status: DC | PRN

## 2024-05-21 MED ORDER — AMLODIPINE BESYLATE 5 MG PO TABS
5.0000 mg | ORAL_TABLET | Freq: Every day | ORAL | Status: DC
  Filled 2024-05-21: qty 1

## 2024-05-21 MED ORDER — ASPIRIN 81 MG PO CHEW
81.0000 mg | CHEWABLE_TABLET | ORAL | Status: AC
  Administered 2024-05-21: 81 mg via ORAL

## 2024-05-21 MED ORDER — ASPIRIN 81 MG PO CHEW
CHEWABLE_TABLET | ORAL | Status: AC
  Filled 2024-05-21: qty 1

## 2024-05-21 MED ORDER — LIDOCAINE HCL 1 % IJ SOLN
INTRAMUSCULAR | Status: AC
  Filled 2024-05-21: qty 20

## 2024-05-21 MED ORDER — HEPARIN (PORCINE) IN NACL 1000-0.9 UT/500ML-% IV SOLN
INTRAVENOUS | Status: DC | PRN
  Administered 2024-05-21: 1000 mL

## 2024-05-21 MED ORDER — METOPROLOL SUCCINATE ER 100 MG PO TB24
100.0000 mg | ORAL_TABLET | Freq: Every day | ORAL | Status: DC
  Administered 2024-05-21: 100 mg via ORAL
  Filled 2024-05-21: qty 2

## 2024-05-21 MED ORDER — LIDOCAINE HCL (PF) 1 % IJ SOLN
INTRAMUSCULAR | Status: DC | PRN
  Administered 2024-05-21 (×2): 2 mL

## 2024-05-21 MED ORDER — SODIUM CHLORIDE 0.9% FLUSH
3.0000 mL | Freq: Two times a day (BID) | INTRAVENOUS | Status: DC
  Administered 2024-05-21: 3 mL via INTRAVENOUS

## 2024-05-21 MED ORDER — HEPARIN (PORCINE) 25000 UT/250ML-% IV SOLN: 950.0000 [IU]/h | INTRAVENOUS | Status: DC

## 2024-05-21 MED ORDER — HEPARIN SODIUM (PORCINE) 1000 UNIT/ML IJ SOLN
INTRAMUSCULAR | Status: DC | PRN
  Administered 2024-05-21: 4000 [IU] via INTRAVENOUS

## 2024-05-21 MED ORDER — FREE WATER
500.0000 mL | Freq: Once | Status: DC
  Filled 2024-05-21: qty 500

## 2024-05-21 MED ORDER — HEPARIN SODIUM (PORCINE) 1000 UNIT/ML IJ SOLN
INTRAMUSCULAR | Status: AC
  Filled 2024-05-21: qty 10

## 2024-05-21 MED ORDER — HEPARIN BOLUS VIA INFUSION
4000.0000 [IU] | Freq: Once | INTRAVENOUS | Status: AC
  Administered 2024-05-21: 4000 [IU] via INTRAVENOUS
  Filled 2024-05-21: qty 4000

## 2024-05-21 MED ORDER — LISINOPRIL 20 MG PO TABS
40.0000 mg | ORAL_TABLET | Freq: Every day | ORAL | Status: DC
  Administered 2024-05-21: 40 mg via ORAL
  Filled 2024-05-21: qty 4

## 2024-05-21 MED ORDER — CLOPIDOGREL BISULFATE 75 MG PO TABS: 75.0000 mg | ORAL_TABLET | Freq: Every day | ORAL | Status: DC

## 2024-05-21 MED ORDER — VERAPAMIL HCL 2.5 MG/ML IV SOLN
INTRAVENOUS | Status: AC
  Filled 2024-05-21: qty 2

## 2024-05-21 MED ORDER — BUSPIRONE HCL 10 MG PO TABS
10.0000 mg | ORAL_TABLET | Freq: Two times a day (BID) | ORAL | Status: DC
Start: 2024-05-21 — End: 2024-05-22
  Administered 2024-05-21: 10 mg via ORAL
  Filled 2024-05-21: qty 2
  Filled 2024-05-21: qty 1

## 2024-05-21 MED ORDER — ONDANSETRON HCL 4 MG/2ML IJ SOLN: 4.0000 mg | Freq: Four times a day (QID) | INTRAMUSCULAR | Status: DC | PRN

## 2024-05-21 MED ORDER — HEPARIN (PORCINE) IN NACL 1000-0.9 UT/500ML-% IV SOLN
INTRAVENOUS | Status: AC
  Filled 2024-05-21: qty 1000

## 2024-05-21 MED ORDER — HEPARIN (PORCINE) 25000 UT/250ML-% IV SOLN
950.0000 [IU]/h | INTRAVENOUS | Status: DC
  Administered 2024-05-21: 950 [IU]/h via INTRAVENOUS
  Filled 2024-05-21: qty 250

## 2024-05-21 MED ORDER — CLOPIDOGREL BISULFATE 75 MG PO TABS
300.0000 mg | ORAL_TABLET | Freq: Once | ORAL | Status: AC
Start: 2024-05-21 — End: 2024-05-21
  Administered 2024-05-21: 300 mg via ORAL
  Filled 2024-05-21: qty 4

## 2024-05-21 MED ORDER — HYDRALAZINE HCL 20 MG/ML IJ SOLN: 10.0000 mg | INTRAMUSCULAR | Status: AC | PRN

## 2024-05-21 MED ORDER — AMLODIPINE BESYLATE 5 MG PO TABS: 5.0000 mg | ORAL_TABLET | Freq: Every day | ORAL | Status: DC

## 2024-05-21 MED ORDER — POTASSIUM CHLORIDE CRYS ER 20 MEQ PO TBCR
20.0000 meq | EXTENDED_RELEASE_TABLET | Freq: Once | ORAL | Status: AC
  Administered 2024-05-21: 20 meq via ORAL
  Filled 2024-05-21: qty 1

## 2024-05-21 MED ORDER — ACETAMINOPHEN 325 MG PO TABS
650.0000 mg | ORAL_TABLET | ORAL | Status: DC | PRN
  Administered 2024-05-21: 650 mg via ORAL
  Filled 2024-05-21: qty 2

## 2024-05-21 MED ORDER — ASPIRIN 81 MG PO CHEW: 324.0000 mg | CHEWABLE_TABLET | Freq: Once | ORAL | Status: DC

## 2024-05-21 MED ORDER — FENTANYL CITRATE (PF) 100 MCG/2ML IJ SOLN
INTRAMUSCULAR | Status: AC
  Filled 2024-05-21: qty 2

## 2024-05-21 MED ORDER — ATORVASTATIN CALCIUM 80 MG PO TABS
80.0000 mg | ORAL_TABLET | Freq: Every day | ORAL | Status: DC
  Administered 2024-05-21: 80 mg via ORAL
  Filled 2024-05-21: qty 4

## 2024-05-21 MED ORDER — ASPIRIN 81 MG PO TBEC: 81.0000 mg | DELAYED_RELEASE_TABLET | Freq: Every day | ORAL | Status: DC

## 2024-05-21 MED ORDER — ALPRAZOLAM 0.25 MG PO TABS
0.2500 mg | ORAL_TABLET | Freq: Two times a day (BID) | ORAL | Status: DC | PRN
  Administered 2024-05-21: 0.25 mg via ORAL
  Filled 2024-05-21 (×2): qty 1

## 2024-05-21 MED ORDER — SODIUM CHLORIDE 0.9% FLUSH: 3.0000 mL | INTRAVENOUS | Status: DC | PRN

## 2024-05-21 MED ORDER — MIDAZOLAM HCL 2 MG/2ML IJ SOLN
INTRAMUSCULAR | Status: AC
  Filled 2024-05-21: qty 2

## 2024-05-21 MED ORDER — SODIUM CHLORIDE 0.9 % IV SOLN: 250.0000 mL | INTRAVENOUS | Status: DC | PRN

## 2024-05-21 MED ORDER — LABETALOL HCL 5 MG/ML IV SOLN: 10.0000 mg | INTRAVENOUS | Status: AC | PRN

## 2024-05-21 MED ORDER — IOHEXOL 350 MG/ML SOLN
100.0000 mL | Freq: Once | INTRAVENOUS | Status: AC | PRN
  Administered 2024-05-21: 100 mL via INTRAVENOUS

## 2024-05-21 MED ORDER — VERAPAMIL HCL 2.5 MG/ML IV SOLN
INTRAVENOUS | Status: DC | PRN
  Administered 2024-05-21: 2.5 mg via INTRA_ARTERIAL

## 2024-05-21 MED ORDER — ACETAMINOPHEN 325 MG PO TABS: 650.0000 mg | ORAL_TABLET | ORAL | Status: DC | PRN

## 2024-05-21 MED ORDER — IOHEXOL 300 MG/ML  SOLN
INTRAMUSCULAR | Status: DC | PRN
  Administered 2024-05-21: 44 mL

## 2024-05-21 MED ORDER — FREE WATER: 500.0000 mL | Freq: Once | Status: DC

## 2024-05-21 SURGICAL SUPPLY — 15 items
CATH BALLN WEDGE 5F 110CM (CATHETERS) IMPLANT
CATH INFINITI 5FR AL1 (CATHETERS) IMPLANT
CATH INFINITI 5FR JL4 (CATHETERS) IMPLANT
CATH INFINITI JR4 5F (CATHETERS) IMPLANT
DEVICE RAD TR BAND REGULAR (VASCULAR PRODUCTS) IMPLANT
DRAPE BRACHIAL (DRAPES) IMPLANT
GLIDESHEATH SLEND SS 6F .021 (SHEATH) IMPLANT
GUIDEWIRE .025 260CM (WIRE) IMPLANT
GUIDEWIRE INQWIRE 1.5J.035X260 (WIRE) IMPLANT
PACK CARDIAC CATH (CUSTOM PROCEDURE TRAY) ×1 IMPLANT
PAD ELECT DEFIB RADIOL ZOLL (MISCELLANEOUS) IMPLANT
SET ATX-X65L (MISCELLANEOUS) IMPLANT
SHEATH GLIDE SLENDER 4/5FR (SHEATH) IMPLANT
STATION PROTECTION PRESSURIZED (MISCELLANEOUS) IMPLANT
WIRE EMERALD ST .035X150CM (WIRE) IMPLANT

## 2024-05-21 NOTE — Interval H&P Note (Signed)
 History and Physical Interval Note:  05/21/2024 2:56 PM  Angel [name]  has presented today for surgery, with the diagnosis of NSTEMI and aortic stenosis.  The various methods of treatment have been discussed with the patient and family. After consideration of risks, benefits and other options for treatment, the patient has consented to  Procedure(s): RIGHT/LEFT HEART CATH AND CORONARY ANGIOGRAPHY (N/A) as a surgical intervention.  The patient's history has been reviewed, patient examined, no change in status, stable for surgery.  I have reviewed the patient's chart and labs.  Questions were answered to the patient's satisfaction.    Cath Lab Visit (complete for each Cath Lab visit)  Clinical Evaluation Leading to the Procedure:   ACS: Yes.    Non-ACS:  N/A  Kortnie Stovall

## 2024-05-21 NOTE — Hospital Course (Signed)
 [name]

## 2024-05-21 NOTE — Progress Notes (Signed)
 ANTICOAGULATION CONSULT NOTE  Pharmacy Consult for heparin infusion Indication: NSTEMI  Allergies  Allergen Reactions   Codeine Other (See Comments)    zoned out   Levofloxacin Other (See Comments)    AMS   Mirabegron  Other (See Comments)    Severe depression   Prednisone Other (See Comments)    AMS   Sulfa Antibiotics Other (See Comments)    AMS    Patient Measurements: Height: 5' 3 (160 cm) Weight: 84.8 kg (187 lb) IBW/kg (Calculated) : 52.4 HEPARIN DW (KG): 71.3  Vital Signs: Temp: 98.3 F (36.8 C) (10/13 2046) Temp Source: Oral (10/13 2046) BP: 160/92 (10/14 0100) Pulse Rate: 90 (10/14 0115)  Labs: Recent Labs    05/20/24 2047 05/21/24 0019  HGB 12.8  --   HCT 37.7  --   PLT 259  --   CREATININE 0.93  --   TROPONINIHS 21* 467*    Estimated Creatinine Clearance: 40.7 mL/min (by C-G formula based on SCr of 0.93 mg/dL).   Medical History: Past Medical History:  Diagnosis Date   Acute CVA (cerebrovascular accident) (HCC) 06/28/2020   Anxiety    Aortic aneurysm without rupture    Aortic stenosis    mild-moderate by echo   Arthritis    CAD (coronary artery disease)    Chronic hip pain    Endometriosis    Hearing loss    HTN, goal below 150/90    Hyperlipidemia LDL goal <70 11/18/2014   NSTEMI (non-ST elevated myocardial infarction) (HCC) 05/21/2024   Osteopenia of the elderly    Urge and stress incontinence     Assessment: Pt is a 89 yo female with h/o aortic valve issue presenting to ED reports chest pain and bilateral jaw pain beginning around 1900 (10/13), found with elevated Troponin I level trending up.  Goal of Therapy:  Heparin level 0.3-0.7 units/ml Monitor platelets by anticoagulation protocol: Yes   Plan:  Bolus 4000 units x 1 Start heparin infusion at 950 units/hr Will check HL in 8 hr after start of infusion CBC daily while on heparin  Samiya Mervin S. Taeko Schaffer, PharmD, [facility] 05/21/2024 4:25 AM

## 2024-05-21 NOTE — Progress Notes (Signed)
 Progress Note   Patient: Angel [name] FMW:[id number] DOB: 11/29/1930 DOA: 05/20/2024     0 DOS: the patient was seen and examined on 05/21/2024   Brief hospital course:  89 y.o. female with medical history significant for HTN, prior stroke, aortic valve stenosis, presumed CAD on prior cardiac MRI a few years ago (small apical infarct), declining further evaluation with cardiac cath at that time, being admitted with NSTEMI.  She presented with retrosternal chest pain radiating to the jaw starting at about 7 PM that resolved spontaneously.  Denies nausea or vomiting, diaphoresis or shortness of breath.  Denies lightheadedness.  Denies lower extremity pain or swelling. In the ED BP 187/93 with pulse 107 and otherwise normal vitals Labs notable for troponin 21--467 CBC normal BMP with minor electrolyte abnormalities including potassium of 3.3 and bicarb of 21 EKG showing sinus tachycardia at 109 with nonspecific T wave abnormalities CTA aorta with no acute findings in the chest abdomen or pelvis Patient given chewable aspirin  324 mg, started on a heparin infusion Admission requested for NSTEMI.   10/14.  Patient decided to go for cardiac catheterization   Assessment and Plan: * NSTEMI (non-ST elevated myocardial infarction) (HCC) Continue heparin drip and aspirin .  On Lipitor.  Check LDL. Nitroglycerin  sublingual as needed chest pain with morphine for breakthrough Appreciate cardiology consult, for cardiac catheterization   Hypokalemia Replace potassium orally  Hypertensive urgency Continue home metoprolol  and lisinopril    History of CVA (cerebrovascular accident) Continue aspirin  and Lipitor  Aortic valve stenosis No acute issues suspected        Subjective: Patient came in with pain in the neck radiating down into her chest.  No shortness of breath, no nausea or vomiting, no arm pain.  Admitted with NSTEMI  Physical Exam: Vitals:   05/21/24 0902 05/21/24 0930  05/21/24 1035 05/21/24 1323  BP: (!) 171/97 (!) 158/90  (!) 175/91  Pulse: (!) 103 95  92  Resp:  19  20  Temp:   97.7 F (36.5 C) 98.3 F (36.8 C)  TempSrc:   Oral Oral  SpO2:  98%  92%  Weight:      Height:       Physical Exam HENT:     Head: Normocephalic.  Eyes:     General: Lids are normal.     Conjunctiva/sclera: Conjunctivae normal.  Cardiovascular:     Rate and Rhythm: Normal rate and regular rhythm.     Heart sounds: S1 normal and S2 normal. Murmur heard.     Systolic murmur is present with a grade of 2/6.  Pulmonary:     Breath sounds: No decreased breath sounds, wheezing, rhonchi or rales.  Abdominal:     Palpations: Abdomen is soft.     Tenderness: There is no abdominal tenderness.  Musculoskeletal:     Right lower leg: No swelling.     Left lower leg: No swelling.  Skin:    General: Skin is warm.     Findings: No rash.  Neurological:     Mental Status: She is alert and oriented to person, place, and time.     Data Reviewed: I platelet count 9.0, hemoglobin 12.9, platelet count 259, creatinine 0.93, potassium 3.3, sodium 137  Family Communication: Spoke with family at bedside  Disposition: Status is: Observation Cardiology for cardiac catheterization today  Planned Discharge Destination: Home    Time spent: 28 minutes Case discussed with cardiology Author: Charlie Patterson, MD 05/21/2024 1:48 PM  For on call review  [email or web address]

## 2024-05-21 NOTE — Assessment & Plan Note (Addendum)
 Patient initially on heparin drip.  Heparin drip will resume 2 hours after TR band removed. Patient on aspirin , Toprol  and Lipitor.  Check LDL.  Cardiology will load with Plavix . Nitroglycerin  sublingual as needed chest pain with morphine for breakthrough Two-vessel disease seen on cardiac catheterization.  Complete occlusion of the circumflex artery and 99% occlusion of the RCA.  Cardiology recommended transfer to [facility] for high risk PCI.

## 2024-05-21 NOTE — Assessment & Plan Note (Signed)
Replaced 

## 2024-05-21 NOTE — Assessment & Plan Note (Addendum)
 Continue aspirin  and Lipitor

## 2024-05-21 NOTE — H&P (Signed)
 History and Physical    Patient: Angel [name] FMW:[id number] DOB: 24-Jun-1931 DOA: 05/20/2024 DOS: the patient was seen and examined on 05/21/2024 PCP: Lendia Boby CROME, NP-C  Patient coming from: Home  Chief Complaint:  Chief Complaint  Patient presents with   Chest Pain    HPI: Angel [name] is a 89 y.o. female with medical history significant for HTN, prior stroke, aortic valve stenosis, presumed CAD on prior cardiac MRI a few years ago (small apical infarct), declining further evaluation with cardiac cath at that time, being admitted with NSTEMI.  She presented with retrosternal chest pain radiating to the jaw starting at about 7 PM that resolved spontaneously.  Denies nausea or vomiting, diaphoresis or shortness of breath.  Denies lightheadedness.  Denies lower extremity pain or swelling. In the ED BP 187/93 with pulse 107 and otherwise normal vitals Labs notable for troponin 21--467 CBC normal BMP with minor electrolyte abnormalities including potassium of 3.3 and bicarb of 21 EKG showing sinus tachycardia at 109 with nonspecific T wave abnormalities CTA aorta with no acute findings in the chest abdomen or pelvis Patient given chewable aspirin  324 mg, started on a heparin infusion Admission requested for NSTEMI.     Review of Systems: As mentioned in the history of present illness. All other systems reviewed and are negative.  Past Medical History:  Diagnosis Date   Acute CVA (cerebrovascular accident) (HCC) 06/28/2020   Anxiety    Aortic aneurysm without rupture    Aortic stenosis    mild-moderate by echo   Arthritis    CAD (coronary artery disease)    Chronic hip pain    Endometriosis    Hearing loss    HTN, goal below 150/90    Hyperlipidemia LDL goal <70 11/18/2014   NSTEMI (non-ST elevated myocardial infarction) (HCC) 05/21/2024   Osteopenia of the elderly    Urge and stress incontinence    Past Surgical History:  Procedure Laterality Date   ABDOMINAL  HYSTERECTOMY  1976   total   APPENDECTOMY     BREAST EXCISIONAL BIOPSY Left 1987   benign   CATARACT EXTRACTION, BILATERAL  08/2018   Dr. Maree with [facility]   Social History:  reports that she has never smoked. She has never used smokeless tobacco. She reports current alcohol use of about 1.0 standard drink of alcohol per week. She reports that she does not use drugs.  Allergies  Allergen Reactions   Codeine Other (See Comments)    zoned out   Levofloxacin Other (See Comments)    AMS   Mirabegron  Other (See Comments)    Severe depression   Prednisone Other (See Comments)    AMS   Sulfa Antibiotics Other (See Comments)    AMS    Family History  Problem Relation Age of Onset   Stroke Mother    Cancer Brother        skin   Breast cancer Maternal Grandmother 60   Heart disease Maternal Grandmother     Prior to Admission medications   Medication Sig Start Date End Date Taking? Authorizing Provider  ALPRAZolam  (XANAX ) 0.25 MG tablet TAKE 1 TABLET BY MOUTH 2 TIMES DAILY AS NEEDED. 05/04/23   Henson, [name] L, NP-C  amLODipine  (NORVASC ) 2.5 MG tablet Take 1 tablet (2.5 mg total) by mouth daily. 04/26/24   End, Lonni, MD  Ascorbic Acid (VITAMIN C) 100 MG tablet Take 500 mg by mouth daily. Gummy 750mg     [provider]  aspirin  EC 81 MG EC tablet Take 1 tablet (81 mg total) by mouth daily. Swallow whole. 07/01/20   Vann, Jessica U, DO  atorvastatin  (LIPITOR) 80 MG tablet TAKE 1 TABLET BY MOUTH EVERY DAY 05/20/24   End, Lonni, MD  budesonide (RHINOCORT AQUA) 32 MCG/ACT nasal spray as needed. 12/02/21   [provider]  busPIRone  (BUSPAR ) 10 MG tablet TAKE 1 TABLET BY MOUTH TWICE A DAY 02/16/24   Henson, [name] L, NP-C  chlorthalidone  (HYGROTON ) 25 MG tablet TAKE 1/2 TABLET BY MOUTH EVERY DAY 08/24/21   Claudene Victory ORN, MD  clobetasol  cream (TEMOVATE ) 0.05 % Apply topically 2 (two) times daily. 02/27/23   [provider]  diclofenac   Sodium (VOLTAREN ) 1 % GEL Apply 2 g topically 4 (four) times daily. 11/29/23   Henson, [name] L, NP-C  lisinopril  (ZESTRIL ) 40 MG tablet TAKE 1 TABLET BY MOUTH EVERY DAY 05/14/24   End, Lonni, MD  metoprolol  succinate (TOPROL -XL) 100 MG 24 hr tablet TAKE 1 TABLET BY MOUTH EVERY DAY WITH OR IMMEDIATELY FOLLOWING A MEAL 05/14/24   End, Lonni, MD  Multiple Vitamin (MULTIVITAMIN) capsule Take 1 capsule by mouth daily.    [provider]  nitroGLYCERIN  (NITROSTAT ) 0.4 MG SL tablet Place 1 tablet (0.4 mg total) under the tongue every 5 (five) minutes as needed for chest pain. 11/20/19   Lelon Hamilton T, PA-C  Omega-3 Fatty Acids (FISH OIL) 1000 MG CAPS Take by mouth daily. Krill oil Patient not taking: Reported on 04/26/2024    [provider]  predniSONE (DELTASONE) 5 MG tablet Take 1 tablet (5 mg total) by mouth daily with breakfast. 05/14/24   Henson, [name] L, NP-C  triamcinolone  cream (KENALOG ) 0.1 % Apply topically. 02/27/23   [provider]  vitamin B-12 (CYANOCOBALAMIN) 100 MCG tablet Take 1,000 mcg by mouth daily.     [provider]  VITAMIN D PO Take 1 tablet by mouth daily. 12/02/21   [provider]    Physical Exam: Vitals:   05/21/24 0015 05/21/24 0045 05/21/24 0100 05/21/24 0115  BP:   (!) 160/92   Pulse: 93 88 86 90  Resp:  19    Temp:      TempSrc:      SpO2: 96% 96% 92% 91%  Weight:      Height:       Physical Exam Vitals and nursing note reviewed.  Constitutional:      General: She is not in acute distress. HENT:     Head: Normocephalic and atraumatic.  Cardiovascular:     Rate and Rhythm: Normal rate and regular rhythm.     Heart sounds: Normal heart sounds.  Pulmonary:     Effort: Pulmonary effort is normal.     Breath sounds: Normal breath sounds.  Abdominal:     Palpations: Abdomen is soft.     Tenderness: There is no abdominal tenderness.  Neurological:     Mental Status: Mental status is at baseline.      Labs on Admission: I have personally reviewed following labs and imaging studies  CBC: Recent Labs  Lab 05/20/24 2047  WBC 9.0  HGB 12.8  HCT 37.7  MCV 95.9  PLT 259   Basic Metabolic Panel: Recent Labs  Lab 05/20/24 2047  NA 137  K 3.3*  CL 102  CO2 21*  GLUCOSE 146*  BUN 38*  CREATININE 0.93  CALCIUM  9.3   GFR: Estimated Creatinine Clearance: 40.7 mL/min (by C-G formula based on SCr  of 0.93 mg/dL). Liver Function Tests: No results for input(s): AST, ALT, ALKPHOS, BILITOT, PROT, ALBUMIN in the last 168 hours. No results for input(s): LIPASE, AMYLASE in the last 168 hours. No results for input(s): AMMONIA in the last 168 hours. Coagulation Profile: No results for input(s): INR, PROTIME in the last 168 hours. Cardiac Enzymes: No results for input(s): CKTOTAL, CKMB, CKMBINDEX, TROPONINI in the last 168 hours. BNP (last 3 results) No results for input(s): PROBNP in the last 8760 hours. HbA1C: No results for input(s): HGBA1C in the last 72 hours. CBG: No results for input(s): GLUCAP in the last 168 hours. Lipid Profile: No results for input(s): CHOL, HDL, LDLCALC, TRIG, CHOLHDL, LDLDIRECT in the last 72 hours. Thyroid  Function Tests: No results for input(s): TSH, T4TOTAL, FREET4, T3FREE, THYROIDAB in the last 72 hours. Anemia Panel: No results for input(s): VITAMINB12, FOLATE, FERRITIN, TIBC, IRON, RETICCTPCT in the last 72 hours. Urine analysis:    Component Value Date/Time   COLORURINE YELLOW 09/01/2021 1015   APPEARANCEUR CLEAR 09/01/2021 1015   APPEARANCEUR Clear 06/07/2018 1455   LABSPEC 1.010 09/01/2021 1015   PHURINE 6.5 09/01/2021 1015   GLUCOSEU NEGATIVE 09/01/2021 1015   HGBUR NEGATIVE 09/01/2021 1015   BILIRUBINUR NEGATIVE 09/01/2021 1015   BILIRUBINUR Negative 06/07/2018 1455   KETONESUR NEGATIVE 09/01/2021 1015   PROTEINUR NEGATIVE 09/01/2021 1015   NITRITE NEGATIVE  09/01/2021 1015   LEUKOCYTESUR MODERATE (A) 09/01/2021 1015    Radiological Exams on Admission: CT Angio Chest/Abd/Pel for Dissection W and/or W/WO Result Date: 05/21/2024 EXAM: CTA CHEST, ABDOMEN AND PELVIS WITH AND WITHOUT CONTRAST 05/21/2024 02:40:53 AM TECHNIQUE: CTA of the chest was performed with and without the administration of intravenous contrast. CTA of the abdomen and pelvis was performed with and without the administration of intravenous contrast. Multiplanar reformatted images are provided for review. MIP images are provided for review. Automated exposure control, iterative reconstruction, and/or weight based adjustment of the mA/kV was utilized to reduce the radiation dose to as low as reasonably achievable. (iohexol  (OMNIPAQUE ) 350 MG/ML injection 100 mL IOHEXOL  350 MG/ML SOLN) was administered. COMPARISON: None available. CLINICAL HISTORY: Acute aortic syndrome (AAS) suspected. Pt reports chest pain and bilateral jaw pain that began around 1900 tonight. Pt reports she has an aortic valve issue that she follows cardiology for. Pt reports she also feels anxious. FINDINGS: VASCULATURE: AORTA: Ectasia of the ascending thoracic aorta, measuring 3.6 cm (coronal image 55). No evidence of thoracoabdominal aortic aneurysm or dissection. Thoracic aortic atherosclerosis. PULMONARY ARTERIES: No evidence of pulmonary embolism. GREAT VESSELS OF AORTIC ARCH: No acute finding. No dissection. No arterial occlusion or significant stenosis. CELIAC TRUNK: No acute finding. No occlusion or significant stenosis. SUPERIOR MESENTERIC ARTERY: No acute finding. No occlusion or significant stenosis. INFERIOR MESENTERIC ARTERY: No acute finding. No occlusion or significant stenosis. RENAL ARTERIES: No acute finding. No occlusion or significant stenosis. ILIAC ARTERIES: No acute finding. No occlusion or significant stenosis. CHEST: MEDIASTINUM: No mediastinal lymphadenopathy. The heart and pericardium demonstrate  no acute abnormality. Moderate 3-vessel coronary atherosclerosis. LUNGS AND PLEURA: Mild subpleural scarring in the lungs bilaterally. No focal consolidation or pulmonary edema. No evidence of pleural effusion or pneumothorax. THORACIC BONES AND SOFT TISSUES: Small hiatal hernia. Mild degenerative changes of the lower thoracic/upper lumbar spine. No acute bone or soft tissue abnormality. ABDOMEN AND PELVIS: LIVER: 3.1 cm posterior right hepatic cyst (image 144), benign. GALLBLADDER AND BILE DUCTS: Gallbladder is unremarkable. No biliary ductal dilatation. SPLEEN: The spleen is unremarkable. PANCREAS: The pancreas is  unremarkable. ADRENAL GLANDS: Bilateral adrenal glands demonstrate no acute abnormality. KIDNEYS, URETERS AND BLADDER: No stones in the kidneys or ureters. No hydronephrosis. No perinephric or periureteral stranding. Periurethral calcifications, likely chronic. Urinary bladder is unremarkable. GI AND BOWEL: Stomach and duodenal sweep demonstrate no acute abnormality. Extensive colonic diverticulosis, without evidence of diverticulitis. The appendix is not discretely visualized, favored to be surgically absent. There is no bowel obstruction. No abnormal bowel wall thickening or distension. REPRODUCTIVE: Status post hysterectomy. PERITONEUM AND RETROPERITONEUM: No ascites or free air. LYMPH NODES: No lymphadenopathy. ABDOMINAL BONES AND SOFT TISSUES: Mild degenerative changes of the lower thoracic/upper lumbar spine. No acute abnormality of the bones. No acute soft tissue abnormality. IMPRESSION: 1. No evidence of thoracoabdominal aortic aneurysm or dissection. 2. No evidence of pulmonary embolism. 3. No acute findings in the chest, abdomen, or pelvis. Electronically signed by: Pinkie Pebbles MD 05/21/2024 02:49 AM EDT RP Workstation: [id number]   DG Chest 2 View Result Date: 05/20/2024 CLINICAL DATA:  Chest pain EXAM: DG CHEST 2V COMPARISON:  09/01/2021 FINDINGS: Heart and mediastinal contours are  within normal limits. No focal opacities or effusions. No acute bony abnormality. Aortic atherosclerosis. IMPRESSION: No active cardiopulmonary disease. Electronically Signed   By: Franky Crease M.D.   On: 05/20/2024 21:22   Data Reviewed for HPI: Relevant notes from primary care and specialist visits, past discharge summaries as available in EHR, including Care Everywhere. Prior diagnostic testing as pertinent to current admission diagnoses Updated medications and problem lists for reconciliation ED course, including vitals, labs, imaging, treatment and response to treatment Triage notes, nursing and pharmacy notes and ED provider's notes Notable results as noted above in HPI      Assessment and Plan: * NSTEMI (non-ST elevated myocardial infarction) (HCC) CAD with history of apical infarct on cardiac MRI Continue heparin infusion Continue to trend troponins Daily aspirin  Nitroglycerin  sublingual as needed chest pain with morphine for breakthrough Cardiology consult Will keep n.p.o. in case of cath in the morning  Hypertensive urgency Continue home metoprolol  and lisinopril  Hold amlodipine  and [place]   History of CVA (cerebrovascular accident) Continue aspirin  and statins pending verification  Aortic valve stenosis No acute issues suspected    DVT prophylaxis: heparin infusion  Consults: [facility] cardiology  Advance Care Planning:   Code Status: Prior   Family Communication: Significant other at bedside  Disposition Plan: Back to previous home environment  Severity of Illness: The appropriate patient status for this patient is OBSERVATION. Observation status is judged to be reasonable and necessary in order to provide the required intensity of service to ensure the patient's safety. The patient's presenting symptoms, physical exam findings, and initial radiographic and laboratory data in the context of their medical condition is felt to place them at decreased risk for  further clinical deterioration. Furthermore, it is anticipated that the patient will be medically stable for discharge from the hospital within 2 midnights of admission.   Author: Delayne LULLA Solian, MD 05/21/2024 3:22 AM  For on call review [email or web address].

## 2024-05-21 NOTE — ED Provider Notes (Signed)
 [facility] Provider Note    Event Date/Time   First MD Initiated Contact with Patient 05/20/24 2330     (approximate)   History   Chest Pain   HPI Angel [name] is a 89 y.o. female who presents for evaluation of bilateral jaw pain as well as chest pain that occurred earlier tonight around 8 PM and has resolved.  She reports that she sees Dr. Mady for cardiology and that she sees him about every 3 months for an echocardiogram and that he has told her in the past that she should definitely get checked out if she has chest pain.  According to her medical record, she has aortic valve stenosis as well as an aortic aneurysm without rupture, and this dates back at least 10 years.  She reports that for the last 2 weeks she has pain in both sides of her jaw that occurs only after she eats a meal.  She saw her dentist about a week ago but she did not think to talk to the dentist at that time about the pain.  She has not had any recent fall or other trauma/injury of which she is aware.  She is not having any trouble speaking or swallowing, just pain that starts at the top of both sides of her jaw and radiates down, goes on for a little while after she chews and eats, and then resolves.  This happened again tonight after dinner and she feels like it has steadily been getting worse over the course of the last 2 weeks.  However tonight was different because she also developed a sharp pain in the middle of her chest that radiated out to both sides.  She is not sure how long it lasted but it has completely resolved.  It was not accompanied with sweating, nausea, vomiting, abdominal pain, passing out, or any other symptom.  She also noticed her blood pressure was elevated tonight which is not typically the case.  She notes that she takes amlodipine  and has for a long time but Dr. Mady recently decreased her dose from 5 mg to 2.5 mg.     Physical Exam   Triage Vital Signs: ED Triage  Vitals  Encounter Vitals Group     BP 05/20/24 2045 (!) 187/93     Girls Systolic BP Percentile --      Girls Diastolic BP Percentile --      Boys Systolic BP Percentile --      Boys Diastolic BP Percentile --      Pulse Rate 05/20/24 2045 (!) 107     Resp 05/20/24 2045 18     Temp 05/20/24 2046 98.3 F (36.8 C)     Temp Source 05/20/24 2046 Oral     SpO2 05/20/24 2045 95 %     Weight 05/20/24 2046 84.8 kg (187 lb)     Height 05/20/24 2046 1.6 m (5' 3)     Head Circumference --      Peak Flow --      Pain Score 05/20/24 2045 4     Pain Loc --      Pain Education --      Exclude from Growth Chart --     Most recent vital signs: Vitals:   05/21/24 0100 05/21/24 0115  BP: (!) 160/92   Pulse: 86 90  Resp:    Temp:    SpO2: 92% 91%    General: Awake, no distress.  Generally well-appearing,  conversant and pleasant. CV:  Good peripheral perfusion.  Regular rate and rhythm (initial tachycardia has resolved).  Strong and easily appreciated systolic murmur consistent with her history of aortic valve stenosis. Resp:  Normal effort. Speaking easily and comfortably, no accessory muscle usage nor intercostal retractions.  Lungs are clear to auscultation bilaterally. Abd:  No distention.  No tenderness to palpation of the abdomen. Head/face: Patient has easily reproducible tenderness to palpation of bilateral temporal mandibular joints without any palpable deformity or visual changes.   ED Results / Procedures / Treatments   Labs (all labs ordered are listed, but only abnormal results are displayed) Labs Reviewed  BASIC METABOLIC PANEL WITH GFR - Abnormal; Notable for the following components:      Result Value   Potassium 3.3 (*)    CO2 21 (*)    Glucose, Bld 146 (*)    BUN 38 (*)    GFR, Estimated 58 (*)    All other components within normal limits  TROPONIN I (HIGH SENSITIVITY) - Abnormal; Notable for the following components:   Troponin I (High Sensitivity) 21 (*)    All  other components within normal limits  TROPONIN I (HIGH SENSITIVITY) - Abnormal; Notable for the following components:   Troponin I (High Sensitivity) 467 (*)    All other components within normal limits  CBC     EKG  ED ECG REPORT I, Darleene Dome, the attending physician, personally viewed and interpreted this ECG.  Date: 05/20/2024 EKG Time: 21: 17 Rate: 103 Rhythm: Borderline sinus tachycardia QRS Axis: normal Intervals: normal ST/T Wave abnormalities: Non-specific ST segment / T-wave changes, but no clear evidence of acute ischemia. Narrative Interpretation: no definitive evidence of acute ischemia; does not meet STEMI criteria.    RADIOLOGY I independently viewed and interpreted the patient's two-view chest x-ray and I see no evidence of acute abnormality such as pneumonia or pneumothorax.  I also read the radiologist's report, which confirmed no acute findings.   PROCEDURES:  Critical Care performed: Yes, see critical care procedure note(s)  .1-3 Lead EKG Interpretation  Performed by: Dome Darleene, MD Authorized by: Dome Darleene, MD     Interpretation: normal     ECG rate:  90   ECG rate assessment: normal     Rhythm: sinus rhythm     Ectopy: none     Conduction: normal   .Critical Care  Performed by: Dome Darleene, MD Authorized by: Dome Darleene, MD   Critical care provider statement:    Critical care time (minutes):  45   Critical care time was exclusive of:  Separately billable procedures and treating other patients   Critical care was necessary to treat or prevent imminent or life-threatening deterioration of the following conditions:  Circulatory failure (NSTEMI requiring IV heparin)   Critical care was time spent personally by me on the following activities:  Development of treatment plan with patient or surrogate, evaluation of patient's response to treatment, examination of patient, obtaining history from patient or surrogate, ordering and performing  treatments and interventions, ordering and review of laboratory studies, ordering and review of radiographic studies, pulse oximetry, re-evaluation of patient's condition and review of old charts     IMPRESSION / MDM / ASSESSMENT AND PLAN / ED COURSE  I reviewed the triage vital signs and the nursing notes.                              Differential  diagnosis includes, but is not limited to, nonspecific chest pain, ACS including unstable angina, AAS, PE, pneumonia, pneumothorax, TMJ pain or other mandibular malalignment.  Patient's presentation is most consistent with acute presentation with potential threat to life or bodily function.  Labs/studies ordered: EKG, two-view chest x-ray, high-sensitivity troponin x 2, BMP, CBC  Interventions/Medications given:  Medications  aspirin  chewable tablet 324 mg (has no administration in time range)  iohexol  (OMNIPAQUE ) 350 MG/ML injection 100 mL (100 mLs Intravenous Contrast Given 05/21/24 0227)    (Note:  hospital course my include additional interventions and/or labs/studies not listed above.)   Vital signs are reassuring.  Although her initial blood pressure was elevated, as well as her heart rate, I think this was likely circumstantial because both of come down substantially.  She is not having any ongoing pain.  No focal neurological deficits.  I think that AAS and PE are extremely unlikely (will score for PE is 0 now that the tachycardia has resolved).  Her initial high-sensitivity troponin is 21 but it is just barely above the upper limit of normal.  I think that ACS is unlikely but we will check a second high-sensitivity troponin.  If she remains pain-free and the second troponin is essentially unchanged, she should be appropriate for outpatient follow-up with Dr. Mady.  No indication for additional imaging at this time.  I explained to her that in my opinion the 2 weeks of bilateral jaw pain after eating is very unlikely to be related and  that she should follow-up with her dentist and/or with ENT to discuss management of this pain.  She and her companion agree with that plan     Clinical Course as of 05/21/24 0318  Tue May 21, 2024  0132 Troponin I (High Sensitivity)(!!): 467 The patient is troponin has gone up to 467 which is worrisome and likely represents an NSTEMI, particularly given the history of chest pain.  However, I cannot exclude the possibility of AAS, particularly given her higher than usual blood pressure and history of aortic aneurysm.  I cannot see any results in the computer system, both [facility] and care everywhere, in terms of imaging.  The patient confirmed that she has known about her aneurysm, which she says is in her chest (thoracic), for somewhere between 11 and 15 years.  Given the possibility that I could do more harm than good by ordering aspirin  and heparin if she is having AAS, I have ordered a CTA chest/abdomen/pelvis to rule out dissection prior to ordering NSTEMI treatment.  Regardless, the patient will be admitted to the hospitalist service once I rule out AAS via imaging [CF]  0301 CT Angio Chest/Abd/Pel for Dissection W and/or W/WO I independently viewed and Interpreted the patient's CTA chest/abdomen/pelvis.  I see no evidence of dissection and I cannot see any sign of an aneurysm.  This is confirmed by the radiologist. [CF]  0302 Given no sign of AAS, I am moving forward with the NSTEMI diagnosis.  I ordered a full dose aspirin  and heparin per pharmacy protocol (bolus plus infusion).I am consulting the hospitalist team for admission.   [CF]  0317 I consulted by phone with the admitting hospitalist, and they will admit the patient - Dr. Cleatus. [CF]    Clinical Course User Index [CF] Gordan Huxley, MD     FINAL CLINICAL IMPRESSION(S) / ED DIAGNOSES   Final diagnoses:  Bilateral temporomandibular joint pain  Primary hypertension  NSTEMI (non-ST elevated myocardial infarction) (HCC)  Rx /  DC Orders   ED Discharge Orders          Ordered    Ambulatory referral to Cardiology       Comments: If you have not heard from the Cardiology office within the next 72 hours please call [phone].   05/21/24 0024             Note:  This document was prepared using Dragon voice recognition software and may include unintentional dictation errors.   Gordan Huxley, MD 05/21/24 [id number]

## 2024-05-21 NOTE — Hospital Course (Addendum)
 89 y.o. female with medical history significant for HTN, prior stroke, aortic valve stenosis, presumed CAD on prior cardiac MRI a few years ago (small apical infarct), declining further evaluation with cardiac cath at that time, being admitted with NSTEMI.  She presented with retrosternal chest pain radiating to the jaw starting at about 7 PM that resolved spontaneously.  Denies nausea or vomiting, diaphoresis or shortness of breath.  Denies lightheadedness.  Denies lower extremity pain or swelling. In the ED BP 187/93 with pulse 107 and otherwise normal vitals Labs notable for troponin 21--467 CBC normal BMP with minor electrolyte abnormalities including potassium of 3.3 and bicarb of 21 EKG showing sinus tachycardia at 109 with nonspecific T wave abnormalities CTA aorta with no acute findings in the chest abdomen or pelvis Patient given chewable aspirin  324 mg, started on a heparin infusion Admission requested for NSTEMI.   10/14.  Patient decided to go for cardiac catheterization.  Cardiology recommends transfer to [facility] for high risk PCI.  Patient had two-vessel disease with total occlusion of the circumflex artery and 99% occlusion of the RCA.

## 2024-05-21 NOTE — Telephone Encounter (Signed)
 DNR form placed up front for pick up

## 2024-05-21 NOTE — ED Notes (Signed)
 Helped pt, as 1 person assist to the toilet and back to bed.

## 2024-05-21 NOTE — Progress Notes (Signed)
 [facility] Interventional Cardiology Note  Cath findings and treatment options reviewed at length with Angel [name] and Angel [name] family.  She would like to proceed with transfer to [facility] for possible PCI to RCA.  We will resume heparin gtt 2 hours after TR band has been removed and also load Angel [name] with clopidogrel .  Angel [name] has a DNR order, which she rescinded for catheterization.  Code status was readdressed and she would like to be limited code (no intubation or CPR) while admitted (she is okay with rescinding this again for PCI).  Await transfer to telemetry bed at [facility] when available.  Angel Hanson, MD [facility] 05/21/24 8:43 PM

## 2024-05-21 NOTE — Progress Notes (Signed)
 [facility] Liaison Note  This patient is currently followed by [facility]'s outpatient based palliative care program.  [facility] will follow through discharge dispositon  Please call with any Hospice related questions or concerns.  [facility] Liaison Note [phone]

## 2024-05-21 NOTE — Progress Notes (Signed)
 ANTICOAGULATION CONSULT NOTE  Pharmacy Consult for heparin infusion Indication: NSTEMI  Allergies  Allergen Reactions   Codeine Other (See Comments)    zoned out   Levofloxacin Other (See Comments)    AMS   Mirabegron  Other (See Comments)    Severe depression   Prednisone Other (See Comments)    AMS   Sulfa Antibiotics Other (See Comments)    AMS    Patient Measurements: Height: 5' 3 (160 cm) Weight: 84.8 kg (187 lb) IBW/kg (Calculated) : 52.4 HEPARIN DW (KG): 71.3  Vital Signs: Temp: 98.8 F (37.1 C) (10/14 1857) Temp Source: Oral (10/14 1857) BP: 114/61 (10/14 1810) Pulse Rate: 89 (10/14 1815)  Labs: Recent Labs    05/20/24 2047 05/21/24 0019 05/21/24 0200 05/21/24 0646 05/21/24 1511 05/21/24 1519  HGB 12.8  --   --   --  12.6 12.9  HCT 37.7  --   --   --  37.0 38.0  PLT 259  --   --   --   --   --   APTT  --   --  31  --   --   --   LABPROT  --   --  13.3  --   --   --   INR  --   --  1.0  --   --   --   CREATININE 0.93  --   --   --   --   --   TROPONINIHS 21* 467*  --  562*  --   --     Estimated Creatinine Clearance: 40.7 mL/min (by C-G formula based on SCr of 0.93 mg/dL).   Medical History: Past Medical History:  Diagnosis Date   Acute CVA (cerebrovascular accident) (HCC) 06/28/2020   Anxiety    Aortic aneurysm without rupture    Aortic stenosis    mild-moderate by echo   Arthritis    CAD (coronary artery disease)    Chronic hip pain    Endometriosis    Hearing loss    HTN, goal below 150/90    Hyperlipidemia LDL goal <70 11/18/2014   NSTEMI (non-ST elevated myocardial infarction) (HCC) 05/21/2024   Osteopenia of the elderly    Urge and stress incontinence     Assessment: Pt is a 89 yo female with h/o aortic valve issue presenting to ED reports chest pain and bilateral jaw pain beginning around 1900 (10/13), found with elevated Troponin I level trending up.  10/14: Patient went for a cardiac catheterization, which showed  two-vessel disease with total occlusion of the circumflex artery and 99% occlusion of the RCA. Cardiology recommends transfer to [facility] for high risk PCI. Patient was previously on heparin gtt which was held for procedure, with plans to resume heparin gtt 2 hours post radial band removal. Band was removed at 1815.    Goal of Therapy:  Heparin level 0.3-0.7 units/ml Monitor platelets by anticoagulation protocol: Yes   Plan:  - Will resume heparin gtt at previous infusion rate of 950 units/hr. Heparin gtt will be started at 2030 - Will check HL 8 hours after starting gtt - Will monitor CBC daily  Ransom Blanch PGY-1 Pharmacy Resident  [facility]  05/21/2024 7:22 PM

## 2024-05-21 NOTE — ED Notes (Signed)
 Pt tx to Cath lab.

## 2024-05-21 NOTE — Progress Notes (Signed)
 Patient is confused. Patient is trying to elope from bed to get her medications, she refused her bedtime medications at 2130. Patient stated I took them at home.  I inquired about her nighttime medications, she said she took them at home. Patient's significant other states patient does not act like this and requested a psychiatric consult. [facility] When I initially told them that transport would be here in about 30 minutes. The patient initially refused and the partner asked to give them time to discuss their options. After they decided to go, I informed care link. Care link is trying to secure transportation again.  Nurse tech observed patient trying to remove necklace and elope from bed. Bed alarm has been activated. Patient declined xanax  when offered earlier but requested 30 minutes later.

## 2024-05-21 NOTE — Progress Notes (Incomplete)
 Patient is confused. Patient is trying to elope from bed to get her medications, she refused her bedtime medications at 2130. Patient stated I took them at home.  I inquired about her nighttime medications, she said she took them at home. Patient's significant other states patient does not act like this and requested a psychiatric consult. [facility] When I initially told them that transport would be here in about 30 minutes. The patient initially refused and the partner asked to give them time to discuss their options. After they decided to go, I informed care link. Care link is trying to secure transportation again.

## 2024-05-21 NOTE — Discharge Summary (Signed)
 Physician Discharge Summary   Patient: Angel [name] MRN: [id number] DOB: October 15, 1930  Admit date:     05/20/2024  Discharge date: 05/21/24  Discharge Physician: Charlie Patterson   PCP: Lendia Boby CROME, NP-C   Recommendations at discharge:   Transfer to [facility] for high risk PCI  Discharge Diagnoses: Principal Problem:   NSTEMI (non-ST elevated myocardial infarction) [facility]) Active Problems:   Aortic valve stenosis   History of CVA (cerebrovascular accident)   Hypertensive urgency   Hypokalemia    Hospital Course:  89 y.o. female with medical history significant for HTN, prior stroke, aortic valve stenosis, presumed CAD on prior cardiac MRI a few years ago (small apical infarct), declining further evaluation with cardiac cath at that time, being admitted with NSTEMI.  She presented with retrosternal chest pain radiating to the jaw starting at about 7 PM that resolved spontaneously.  Denies nausea or vomiting, diaphoresis or shortness of breath.  Denies lightheadedness.  Denies lower extremity pain or swelling. In the ED BP 187/93 with pulse 107 and otherwise normal vitals Labs notable for troponin 21--467 CBC normal BMP with minor electrolyte abnormalities including potassium of 3.3 and bicarb of 21 EKG showing sinus tachycardia at 109 with nonspecific T wave abnormalities CTA aorta with no acute findings in the chest abdomen or pelvis Patient given chewable aspirin  324 mg, started on a heparin infusion Admission requested for NSTEMI.   10/14.  Patient decided to go for cardiac catheterization.  Cardiology recommends transfer to [facility] for high risk PCI.  Patient had two-vessel disease with total occlusion of the circumflex artery and 99% occlusion of the RCA.     Assessment and Plan: * NSTEMI (non-ST elevated myocardial infarction) (HCC) Patient initially on heparin drip.  Heparin drip will resume 2 hours after TR band removed. Patient on aspirin ,  Toprol  and Lipitor.  Check LDL.  Cardiology will load with Plavix . Nitroglycerin  sublingual as needed chest pain with morphine for breakthrough Two-vessel disease seen on cardiac catheterization.  Complete occlusion of the circumflex artery and 99% occlusion of the RCA.  Cardiology recommended transfer to [facility] for high risk PCI.   Aortic valve stenosis No acute issues suspected  History of CVA (cerebrovascular accident) Continue aspirin  and Lipitor  Hypertensive urgency Continue home metoprolol  and lisinopril .  Add Norvasc .   Hypokalemia Replace potassium orally   Patient's CODE STATUS now a DNR limited.      Consultants: Cardiology Procedures performed: None Disposition: Transfer to [facility] Diet recommendation:  Cardiac diet DISCHARGE MEDICATION: Allergies as of 05/21/2024       Reactions   Codeine Other (See Comments)   zoned out   Levofloxacin Other (See Comments)   AMS   Mirabegron  Other (See Comments)   Severe depression   Prednisone Other (See Comments)   AMS   Sulfa Antibiotics Other (See Comments)   AMS        Medication List     STOP taking these medications    budesonide 32 MCG/ACT nasal spray Commonly known as: RHINOCORT AQUA   chlorthalidone  25 MG tablet Commonly known as: HYGROTON    clobetasol  cream 0.05 % Commonly known as: TEMOVATE    diclofenac  Sodium 1 % Gel Commonly known as: Voltaren    Fish Oil 1000 MG Caps   multivitamin capsule   triamcinolone  cream 0.1 % Commonly known as: KENALOG    vitamin C 100 MG tablet   VITAMIN D PO       TAKE these medications  acetaminophen  325 MG tablet Commonly known as: TYLENOL  Take 2 tablets (650 mg total) by mouth every 4 (four) hours as needed for headache or mild pain (pain score 1-3).   ALPRAZolam  0.25 MG tablet Commonly known as: XANAX  TAKE 1 TABLET BY MOUTH 2 TIMES DAILY AS NEEDED.   amLODipine  5 MG tablet Commonly known as: NORVASC  Take 1  tablet (5 mg total) by mouth daily. What changed:  medication strength how much to take   aspirin  EC 81 MG tablet Take 1 tablet (81 mg total) by mouth daily. Swallow whole.   atorvastatin  80 MG tablet Commonly known as: LIPITOR TAKE 1 TABLET BY MOUTH EVERY DAY   busPIRone  10 MG tablet Commonly known as: BUSPAR  TAKE 1 TABLET BY MOUTH TWICE A DAY   clopidogrel  75 MG tablet Commonly known as: PLAVIX  Take 1 tablet (75 mg total) by mouth daily. Start taking on: May 22, 2024   lisinopril  40 MG tablet Commonly known as: ZESTRIL  TAKE 1 TABLET BY MOUTH EVERY DAY   metoprolol  succinate 100 MG 24 hr tablet Commonly known as: TOPROL -XL TAKE 1 TABLET BY MOUTH EVERY DAY WITH OR IMMEDIATELY FOLLOWING A MEAL   nitroGLYCERIN  0.4 MG SL tablet Commonly known as: NITROSTAT  Place 1 tablet (0.4 mg total) under the tongue every 5 (five) minutes as needed for chest pain.   predniSONE 5 MG tablet Commonly known as: DELTASONE Take 1 tablet (5 mg total) by mouth daily with breakfast.   vitamin B-12 100 MCG tablet Commonly known as: CYANOCOBALAMIN Take 1,000 mcg by mouth daily.        Follow-up Information     Call  [place], [name] L, NP-C.   Specialty: Family Medicine Why: for a follow up appointment Contact information: [address] [place] [address] [phone]         Call  Milissa Hamming, MD.   Specialty: Otolaryngology Why: for a follow up appointment with ENT to discuss your jaw pain if you would like to see someone other than your dentist Contact information: [address] [place] [address] [phone]         Your dentist.   Why: to follow up regarding your jaw pain        End, Lonni, MD.   Specialty: Cardiology Why: Someone from Dr. Ulysses office will reach out to you to schedule a follow up appointment Contact information: [address] [place] [address] [phone]                 Discharge Exam: [name]   05/20/24 2046  Weight: 84.8 kg   Physical Exam HENT:     Head: Normocephalic.  Eyes:     General: Lids are normal.     Conjunctiva/sclera: Conjunctivae normal.  Cardiovascular:     Rate and Rhythm: Normal rate and regular rhythm.     Heart sounds: S1 normal and S2 normal. Murmur heard.     Systolic murmur is present with a grade of 2/6.  Pulmonary:     Breath sounds: No decreased breath sounds, wheezing, rhonchi or rales.  Abdominal:     Palpations: Abdomen is soft.     Tenderness: There is no abdominal tenderness.  Musculoskeletal:     Right lower leg: No swelling.     Left lower leg: No swelling.  Skin:    General: Skin is warm.     Findings: No rash.  Neurological:     Mental Status: She  is alert and oriented to person, place, and time.      Condition at discharge: stable  The results of significant diagnostics from this hospitalization (including imaging, microbiology, ancillary and laboratory) are listed below for reference.   Imaging Studies: CT Angio Chest/Abd/Pel for Dissection W and/or W/WO Result Date: 05/21/2024 EXAM: CTA CHEST, ABDOMEN AND PELVIS WITH AND WITHOUT CONTRAST 05/21/2024 02:40:53 AM TECHNIQUE: CTA of the chest was performed with and without the administration of intravenous contrast. CTA of the abdomen and pelvis was performed with and without the administration of intravenous contrast. Multiplanar reformatted images are provided for review. MIP images are provided for review. Automated exposure control, iterative reconstruction, and/or weight based adjustment of the mA/kV was utilized to reduce the radiation dose to as low as reasonably achievable. (iohexol  (OMNIPAQUE ) 350 MG/ML injection 100 mL IOHEXOL  350 MG/ML SOLN) was administered. COMPARISON: None available. CLINICAL HISTORY: Acute aortic syndrome (AAS) suspected. Pt reports chest pain and bilateral jaw pain that began around 1900 tonight. Pt reports she  has an aortic valve issue that she follows cardiology for. Pt reports she also feels anxious. FINDINGS: VASCULATURE: AORTA: Ectasia of the ascending thoracic aorta, measuring 3.6 cm (coronal image 55). No evidence of thoracoabdominal aortic aneurysm or dissection. Thoracic aortic atherosclerosis. PULMONARY ARTERIES: No evidence of pulmonary embolism. GREAT VESSELS OF AORTIC ARCH: No acute finding. No dissection. No arterial occlusion or significant stenosis. CELIAC TRUNK: No acute finding. No occlusion or significant stenosis. SUPERIOR MESENTERIC ARTERY: No acute finding. No occlusion or significant stenosis. INFERIOR MESENTERIC ARTERY: No acute finding. No occlusion or significant stenosis. RENAL ARTERIES: No acute finding. No occlusion or significant stenosis. ILIAC ARTERIES: No acute finding. No occlusion or significant stenosis. CHEST: MEDIASTINUM: No mediastinal lymphadenopathy. The heart and pericardium demonstrate no acute abnormality. Moderate 3-vessel coronary atherosclerosis. LUNGS AND PLEURA: Mild subpleural scarring in the lungs bilaterally. No focal consolidation or pulmonary edema. No evidence of pleural effusion or pneumothorax. THORACIC BONES AND SOFT TISSUES: Small hiatal hernia. Mild degenerative changes of the lower thoracic/upper lumbar spine. No acute bone or soft tissue abnormality. ABDOMEN AND PELVIS: LIVER: 3.1 cm posterior right hepatic cyst (image 144), benign. GALLBLADDER AND BILE DUCTS: Gallbladder is unremarkable. No biliary ductal dilatation. SPLEEN: The spleen is unremarkable. PANCREAS: The pancreas is unremarkable. ADRENAL GLANDS: Bilateral adrenal glands demonstrate no acute abnormality. KIDNEYS, URETERS AND BLADDER: No stones in the kidneys or ureters. No hydronephrosis. No perinephric or periureteral stranding. Periurethral calcifications, likely chronic. Urinary bladder is unremarkable. GI AND BOWEL: Stomach and duodenal sweep demonstrate no acute abnormality. Extensive colonic  diverticulosis, without evidence of diverticulitis. The appendix is not discretely visualized, favored to be surgically absent. There is no bowel obstruction. No abnormal bowel wall thickening or distension. REPRODUCTIVE: Status post hysterectomy. PERITONEUM AND RETROPERITONEUM: No ascites or free air. LYMPH NODES: No lymphadenopathy. ABDOMINAL BONES AND SOFT TISSUES: Mild degenerative changes of the lower thoracic/upper lumbar spine. No acute abnormality of the bones. No acute soft tissue abnormality. IMPRESSION: 1. No evidence of thoracoabdominal aortic aneurysm or dissection. 2. No evidence of pulmonary embolism. 3. No acute findings in the chest, abdomen, or pelvis. Electronically signed by: Pinkie Pebbles MD 05/21/2024 02:49 AM EDT RP Workstation: [id number]   DG Chest 2 View Result Date: 05/20/2024 CLINICAL DATA:  Chest pain EXAM: DG CHEST 2V COMPARISON:  09/01/2021 FINDINGS: Heart and mediastinal contours are within normal limits. No focal opacities or effusions. No acute bony abnormality. Aortic atherosclerosis. IMPRESSION: No active cardiopulmonary disease. Electronically Signed   By: Franky  Dover M.D.   On: 05/20/2024 21:22    Labs: CBC: Recent Labs  Lab 05/20/24 2047 05/21/24 1511 05/21/24 1519  WBC 9.0  --   --   HGB 12.8 12.6 12.9  HCT 37.7 37.0 38.0  MCV 95.9  --   --   PLT 259  --   --    Basic Metabolic Panel: Recent Labs  Lab 05/20/24 2047 05/21/24 1511 05/21/24 1519  NA 137 137 141  K 3.3* 3.3* 3.5  CL 102  --   --   CO2 21*  --   --   GLUCOSE 146*  --   --   BUN 38*  --   --   CREATININE 0.93  --   --   CALCIUM  9.3  --   --    Liver Function Tests: No results for input(s): AST, ALT, ALKPHOS, BILITOT, PROT, ALBUMIN in the last 168 hours. CBG: No results for input(s): GLUCAP in the last 168 hours.  Discharge time spent: greater than 30 minutes.  Signed: Charlie Patterson, MD [facility] 05/21/2024

## 2024-05-21 NOTE — H&P (View-Only) (Signed)
 Cardiology Consultation   Patient ID: EMREE LOCICERO MRN: [id number]; DOB: [birth year]  Admit date: 05/20/2024 Date of Consult: 05/21/2024  PCP:  Lendia Boby CROME, NP-C   [facility] Providers Cardiologist:  Lonni Hanson, MD        Patient Profile: Angel [name] is a 89 y.o. female with a hx of poor R wave progression by EKG, abnormal myocardial perfusion stress test, and question of small apical infarct by cardiac MRI, aortic stenosis, hypertension, hyperlipidemia, and stroke 06/2020 who is being seen 05/21/2024 for the evaluation of chest pain at the request of Dr. Josette.  History of Present Illness:   Angel [name] has a history of abnormal myocardial perfusion stress test and questional small apical infarct versus diverticulum by cardiac MRI 10+ years ago.  She has previously declined invasive testing and has been medically managed in the meantime.  Most recent echo 01/2024 showed EF 60 to 65% with mild LVH, G1 DD, and moderate aortic valve stenosis with mean gradient 22 mmHg and V-max 3.18 m/s.   Patient reports intermittent episodes of bilateral jaw tightness over the past few weeks.  These episodes typically occur after eating and resolve within 30 minutes.  Not associated with exertion.  Yesterday evening after dinner, she had another episode of bilateral jaw tightness but then started to feel the pain radiating into the center of her chest.  She describes this as a pressure.  Denies lightheadedness, dizziness, palpitations, nausea, and diaphoresis.  When the pain did not resolve after 30 minutes, she decided she should report to the emergency room for further evaluation.  In the ED, BP 187/93, HR 107 with otherwise normal vital signs.  Pertinent labs include potassium of 3.3 and bicarb of 21.  CBC largely unremarkable.  Troponin 21 > 467 > 562.  EKG shows tachycardia with poor R wave progression, consistent with prior tracings, without acute ischemic changes.  CTA aorta  without acute findings.    Patient was given aspirin  324 mg and started on heparin infusion. Cardiology was asked to consult for further evaluation of chest pain.  At time of cardiology consult, patient reports that pain is largely resolved.  Past Medical History:  Diagnosis Date   Acute CVA (cerebrovascular accident) (HCC) 06/28/2020   Anxiety    Aortic aneurysm without rupture    Aortic stenosis    mild-moderate by echo   Arthritis    CAD (coronary artery disease)    Chronic hip pain    Endometriosis    Hearing loss    HTN, goal below 150/90    Hyperlipidemia LDL goal <70 11/18/2014   NSTEMI (non-ST elevated myocardial infarction) (HCC) 05/21/2024   Osteopenia of the elderly    Urge and stress incontinence     Past Surgical History:  Procedure Laterality Date   ABDOMINAL HYSTERECTOMY  1976   total   APPENDECTOMY     BREAST EXCISIONAL BIOPSY Left 1987   benign   CATARACT EXTRACTION, BILATERAL  08/2018   Dr. Maree with [facility]       Scheduled Meds:  [START ON 05/22/2024] aspirin  EC  81 mg Oral Daily   atorvastatin   80 mg Oral Daily   busPIRone   10 mg Oral BID   free water  500 mL Oral Once   lisinopril   40 mg Oral Daily   metoprolol  succinate  100 mg Oral Daily   Continuous Infusions:  heparin 950 Units/hr (05/21/24 0722)   PRN Meds: acetaminophen , ALPRAZolam , nitroGLYCERIN , ondansetron (ZOFRAN)  IV  Allergies:    Allergies  Allergen Reactions   Codeine Other (See Comments)    zoned out   Levofloxacin Other (See Comments)    AMS   Mirabegron  Other (See Comments)    Severe depression   Prednisone Other (See Comments)    AMS   Sulfa Antibiotics Other (See Comments)    AMS    Social History:   Social History   Socioeconomic History   Marital status: Significant Other    Spouse name: Not on file   Number of children: 3   Years of education: Not on file   Highest education level: Master's degree (e.g., MA, MS, MEng, MEd, MSW, MBA)   Occupational History   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  Tobacco Use   Smoking status: Never   Smokeless tobacco: Never  Vaping Use   Vaping status: Never Used  Substance and Sexual Activity   Alcohol use: Yes    Alcohol/week: 1.0 standard drink of alcohol    Types: 1 Cans of beer per week    Comment: once a month   Drug use: No   Sexual activity: Not on file  Other Topics Concern   Not on file  Social History Narrative   Not on file   Social Drivers of Health   Financial Resource Strain: Low Risk  (06/22/2023)   Overall Financial Resource Strain (CARDIA)    Difficulty of Paying Living Expenses: Not very hard  Food Insecurity: No Food Insecurity (06/22/2023)   Hunger Vital Sign    Worried About Running Out of Food in the Last Year: Never true    Ran Out of Food in the Last Year: Never true  Transportation Needs: No Transportation Needs (06/22/2023)   PRAPARE - Administrator, Civil Service (Medical): No    Lack of Transportation (Non-Medical): No  Physical Activity: Inactive (06/22/2023)   Exercise Vital Sign    Days of Exercise per Week: 0 days    Minutes of Exercise per Session: 0 min  Stress: No Stress Concern Present (06/22/2023)   [employer] of Occupational Health - Occupational Stress Questionnaire    Feeling of Stress : Only a little  Social Connections: Socially Isolated (06/22/2023)   Social Connection and Isolation Panel    Frequency of Communication with Friends and Family: More than three times a week    Frequency of Social Gatherings with Friends and Family: More than three times a week    Attends Religious Services: Never    Database administrator or Organizations: No    Attends Banker Meetings: Never    Marital Status: Widowed  Intimate Partner Violence: Patient Unable To Answer (06/22/2023)   Humiliation, Afraid, Rape, and Kick questionnaire    Fear of Current or Ex-Partner: Patient unable to answer    Emotionally  Abused: Patient unable to answer    Physically Abused: Patient unable to answer    Sexually Abused: Patient unable to answer    Family History:    Family History  Problem Relation Age of Onset   Stroke Mother    Cancer Brother        skin   Breast cancer Maternal Grandmother 60   Heart disease Maternal Grandmother      ROS:  Please see the history of present illness.   Physical Exam/Data: Vitals:   05/21/24 0900 05/21/24 0902 05/21/24 0930 05/21/24 1035  BP: (!) 171/97 (!) 171/97 (!) 158/90   Pulse: (!) 105 ROLLEN)  103 95   Resp: 17  19   Temp:    97.7 F (36.5 C)  TempSrc:    Oral  SpO2: 97%  98%   Weight:      Height:        Intake/Output Summary (Last 24 hours) at 05/21/2024 1252 Last data filed at 05/21/2024 9297 Gross per 24 hour  Intake --  Output 700 ml  Net -700 ml      05/20/2024    8:46 PM 04/26/2024    8:47 AM 03/01/2024    3:54 PM  Last 3 Weights  Weight (lbs) 187 lb 187 lb 183 lb  Weight (kg) 84.823 kg 84.823 kg 83.008 kg     Body mass index is 33.13 kg/m.  General:  Well nourished, well developed, in no acute distress HEENT: normal Neck: + JVP Vascular: No carotid bruits; Distal pulses 2+ bilaterally Cardiac:  normal S1, S2; RRR; II/VI systolic murmur Lungs:  clear to auscultation bilaterally, no wheezing, rhonchi or rales  Abd: soft, nontender, no hepatomegaly  Ext: trace LE edema Skin: warm and dry  Psych:  Normal affect   EKG:  The EKG was personally reviewed and demonstrates: Sinus tachycardia with poor R wave progression, rate 103 bpm Telemetry:  Telemetry was personally reviewed and demonstrates:  Sinus rhythm and sinus tachycardia with PVCs  Relevant CV Studies:  01/2024 Echo complete 1. Left ventricular ejection fraction, by estimation, is 60 to 65%. The  left ventricle has normal function. The left ventricle has no regional  wall motion abnormalities. There is mild left ventricular hypertrophy.  Left ventricular diastolic  parameters  are consistent with Grade I diastolic dysfunction (impaired relaxation).   2. Right ventricular systolic function is normal. The right ventricular  size is normal.   3. Left atrial size was mildly dilated.   4. The mitral valve is degenerative. No evidence of mitral valve  regurgitation.   5. Aov DVI 0.27. The aortic valve is calcified. Aortic valve  regurgitation is not visualized. Moderate aortic valve stenosis. Aortic  valve mean gradient measures 22.0 mmHg. Aortic valve Vmax measures 3.18  m/s.   6. The inferior vena cava is normal in size with greater than 50%  respiratory variability, suggesting right atrial pressure of 3 mmHg.   Laboratory Data: High Sensitivity Troponin:   Recent Labs  Lab 05/20/24 2047 05/21/24 0019 05/21/24 0646  TROPONINIHS 21* 467* 562*     Chemistry Recent Labs  Lab 05/20/24 2047  NA 137  K 3.3*  CL 102  CO2 21*  GLUCOSE 146*  BUN 38*  CREATININE 0.93  CALCIUM  9.3  GFRNONAA 58*  ANIONGAP 14    No results for input(s): PROT, ALBUMIN, AST, ALT, ALKPHOS, BILITOT in the last 168 hours. Lipids No results for input(s): CHOL, TRIG, HDL, LABVLDL, LDLCALC, CHOLHDL in the last 168 hours.  Hematology Recent Labs  Lab 05/20/24 2047  WBC 9.0  RBC 3.93  HGB 12.8  HCT 37.7  MCV 95.9  MCH 32.6  MCHC 34.0  RDW 12.6  PLT 259   Thyroid  No results for input(s): TSH, FREET4 in the last 168 hours.  BNPNo results for input(s): BNP, PROBNP in the last 168 hours.  DDimer No results for input(s): DDIMER in the last 168 hours.  Radiology/Studies:  CT Angio Chest/Abd/Pel for Dissection W and/or W/WO Result Date: 05/21/2024 IMPRESSION: 1. No evidence of thoracoabdominal aortic aneurysm or dissection. 2. No evidence of pulmonary embolism. 3. No acute findings in the chest, abdomen,  or pelvis. Electronically signed by: Pinkie Pebbles MD 05/21/2024 02:49 AM EDT RP Workstation: [id number]   DG Chest 2  View Result Date: 05/20/2024 IMPRESSION: No active cardiopulmonary disease. Electronically Signed   By: Franky Crease M.D.   On: 05/20/2024 21:22   Assessment and Plan:  NSTEMI Coronary artery disease Hyperlipidemia - Prior abnormal myocardial perfusion stress test with subsequent MRI showing a possible small area of scarring near the apex 10+ years ago - Most recent echo 01/2024 with EF 60-65% without RWMA - Presenting with bilateral jaw pain radiating into the central chest - EKG with poor R wave progression, consistent with prior tracings.  No acute ST/T wave abnormalities. - Troponin 21 > 467 > 562 - Continue heparin GTT - Obtain echo - Continue aspirin , statin, and beta-blocker therapy - Discussed options with patient including ischemic evaluation versus conservative medical therapy.  After further thought, patient agrees to proceed with right and left heart catheterization.  Aortic valve stenosis - Most recent echo 01/2024 with moderate aortic valve stenosis with mean gradient 22 mmHg and Vmax 3.18 m/s - Repeat echo as above - R/LHC as above  Hypertension - Continue PTA antihypertensives   Risk Assessment/Risk Scores:    TIMI Risk Score for Unstable Angina or Non-ST Elevation MI:   The patient's TIMI risk score is  , which indicates a  % risk of all cause mortality, new or recurrent myocardial infarction or need for urgent revascularization in the next 14 days.  Informed Consent   Shared Decision Making/Informed Consent The risks, including but not limited to, [bleeding or vascular complications (1 in 500), pneumothorax (1 in 1600), arrhythmia (1 in 1000) and death (1 in 5000)], benefits (diagnostic support and/or management of heart failure, pulmonary hypertension) and alternatives of a right heart catheterization were discussed in detail with Angel [name] and she is willing to proceed. The risks [stroke (1 in 1000), death (1 in 1000), kidney failure [usually temporary] (1 in  500), bleeding (1 in 200), allergic reaction [possibly serious] (1 in 200)], benefits (diagnostic support and management of coronary artery disease) and alternatives of a cardiac catheterization were discussed in detail with Angel [name] and she is willing to proceed.      For questions or updates, please contact [facility] Please consult [email or web address] for contact info under      Signed, Lesley LITTIE Maffucci, PA-C  05/21/2024 12:52 PM

## 2024-05-21 NOTE — Consult Note (Signed)
 Cardiology Consultation   Patient ID: Angel [name] MRN: [id number]; DOB: [birth year]  Admit date: 05/20/2024 Date of Consult: 05/21/2024  PCP:  Lendia Boby CROME, NP-C   [facility] Providers Cardiologist:  Lonni Hanson, MD        Patient Profile: Angel [name] is a 89 y.o. female with a hx of poor R wave progression by EKG, abnormal myocardial perfusion stress test, and question of small apical infarct by cardiac MRI, aortic stenosis, hypertension, hyperlipidemia, and stroke 06/2020 who is being seen 05/21/2024 for the evaluation of chest pain at the request of Dr. Josette.  History of Present Illness:   Angel [name] has a history of abnormal myocardial perfusion stress test and questional small apical infarct versus diverticulum by cardiac MRI 10+ years ago.  She has previously declined invasive testing and has been medically managed in the meantime.  Most recent echo 01/2024 showed EF 60 to 65% with mild LVH, G1 DD, and moderate aortic valve stenosis with mean gradient 22 mmHg and V-max 3.18 m/s.   Patient reports intermittent episodes of bilateral jaw tightness over the past few weeks.  These episodes typically occur after eating and resolve within 30 minutes.  Not associated with exertion.  Yesterday evening after dinner, she had another episode of bilateral jaw tightness but then started to feel the pain radiating into the center of her chest.  She describes this as a pressure.  Denies lightheadedness, dizziness, palpitations, nausea, and diaphoresis.  When the pain did not resolve after 30 minutes, she decided she should report to the emergency room for further evaluation.  In the ED, BP 187/93, HR 107 with otherwise normal vital signs.  Pertinent labs include potassium of 3.3 and bicarb of 21.  CBC largely unremarkable.  Troponin 21 > 467 > 562.  EKG shows tachycardia with poor R wave progression, consistent with prior tracings, without acute ischemic changes.  CTA aorta  without acute findings.    Patient was given aspirin  324 mg and started on heparin infusion. Cardiology was asked to consult for further evaluation of chest pain.  At time of cardiology consult, patient reports that pain is largely resolved.  Past Medical History:  Diagnosis Date   Acute CVA (cerebrovascular accident) (HCC) 06/28/2020   Anxiety    Aortic aneurysm without rupture    Aortic stenosis    mild-moderate by echo   Arthritis    CAD (coronary artery disease)    Chronic hip pain    Endometriosis    Hearing loss    HTN, goal below 150/90    Hyperlipidemia LDL goal <70 11/18/2014   NSTEMI (non-ST elevated myocardial infarction) (HCC) 05/21/2024   Osteopenia of the elderly    Urge and stress incontinence     Past Surgical History:  Procedure Laterality Date   ABDOMINAL HYSTERECTOMY  1976   total   APPENDECTOMY     BREAST EXCISIONAL BIOPSY Left 1987   benign   CATARACT EXTRACTION, BILATERAL  08/2018   Dr. Maree with [facility]       Scheduled Meds:  [START ON 05/22/2024] aspirin  EC  81 mg Oral Daily   atorvastatin   80 mg Oral Daily   busPIRone   10 mg Oral BID   free water  500 mL Oral Once   lisinopril   40 mg Oral Daily   metoprolol  succinate  100 mg Oral Daily   Continuous Infusions:  heparin 950 Units/hr (05/21/24 0722)   PRN Meds: acetaminophen , ALPRAZolam , nitroGLYCERIN , ondansetron (ZOFRAN)  IV  Allergies:    Allergies  Allergen Reactions   Codeine Other (See Comments)    zoned out   Levofloxacin Other (See Comments)    AMS   Mirabegron  Other (See Comments)    Severe depression   Prednisone Other (See Comments)    AMS   Sulfa Antibiotics Other (See Comments)    AMS    Social History:   Social History   Socioeconomic History   Marital status: Significant Other    Spouse name: Not on file   Number of children: 3   Years of education: Not on file   Highest education level: Master's degree (e.g., MA, MS, MEng, MEd, MSW, MBA)   Occupational History   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  Tobacco Use   Smoking status: Never   Smokeless tobacco: Never  Vaping Use   Vaping status: Never Used  Substance and Sexual Activity   Alcohol use: Yes    Alcohol/week: 1.0 standard drink of alcohol    Types: 1 Cans of beer per week    Comment: once a month   Drug use: No   Sexual activity: Not on file  Other Topics Concern   Not on file  Social History Narrative   Not on file   Social Drivers of Health   Financial Resource Strain: Low Risk  (06/22/2023)   Overall Financial Resource Strain (CARDIA)    Difficulty of Paying Living Expenses: Not very hard  Food Insecurity: No Food Insecurity (06/22/2023)   Hunger Vital Sign    Worried About Running Out of Food in the Last Year: Never true    Ran Out of Food in the Last Year: Never true  Transportation Needs: No Transportation Needs (06/22/2023)   PRAPARE - Administrator, Civil Service (Medical): No    Lack of Transportation (Non-Medical): No  Physical Activity: Inactive (06/22/2023)   Exercise Vital Sign    Days of Exercise per Week: 0 days    Minutes of Exercise per Session: 0 min  Stress: No Stress Concern Present (06/22/2023)   [employer] of Occupational Health - Occupational Stress Questionnaire    Feeling of Stress : Only a little  Social Connections: Socially Isolated (06/22/2023)   Social Connection and Isolation Panel    Frequency of Communication with Friends and Family: More than three times a week    Frequency of Social Gatherings with Friends and Family: More than three times a week    Attends Religious Services: Never    Database administrator or Organizations: No    Attends Banker Meetings: Never    Marital Status: Widowed  Intimate Partner Violence: Patient Unable To Answer (06/22/2023)   Humiliation, Afraid, Rape, and Kick questionnaire    Fear of Current or Ex-Partner: Patient unable to answer    Emotionally  Abused: Patient unable to answer    Physically Abused: Patient unable to answer    Sexually Abused: Patient unable to answer    Family History:    Family History  Problem Relation Age of Onset   Stroke Mother    Cancer Brother        skin   Breast cancer Maternal Grandmother 60   Heart disease Maternal Grandmother      ROS:  Please see the history of present illness.   Physical Exam/Data: Vitals:   05/21/24 0900 05/21/24 0902 05/21/24 0930 05/21/24 1035  BP: (!) 171/97 (!) 171/97 (!) 158/90   Pulse: (!) 105 ROLLEN)  103 95   Resp: 17  19   Temp:    97.7 F (36.5 C)  TempSrc:    Oral  SpO2: 97%  98%   Weight:      Height:        Intake/Output Summary (Last 24 hours) at 05/21/2024 1252 Last data filed at 05/21/2024 9297 Gross per 24 hour  Intake --  Output 700 ml  Net -700 ml      05/20/2024    8:46 PM 04/26/2024    8:47 AM 03/01/2024    3:54 PM  Last 3 Weights  Weight (lbs) 187 lb 187 lb 183 lb  Weight (kg) 84.823 kg 84.823 kg 83.008 kg     Body mass index is 33.13 kg/m.  General:  Well nourished, well developed, in no acute distress HEENT: normal Neck: + JVP Vascular: No carotid bruits; Distal pulses 2+ bilaterally Cardiac:  normal S1, S2; RRR; II/VI systolic murmur Lungs:  clear to auscultation bilaterally, no wheezing, rhonchi or rales  Abd: soft, nontender, no hepatomegaly  Ext: trace LE edema Skin: warm and dry  Psych:  Normal affect   EKG:  The EKG was personally reviewed and demonstrates: Sinus tachycardia with poor R wave progression, rate 103 bpm Telemetry:  Telemetry was personally reviewed and demonstrates:  Sinus rhythm and sinus tachycardia with PVCs  Relevant CV Studies:  01/2024 Echo complete 1. Left ventricular ejection fraction, by estimation, is 60 to 65%. The  left ventricle has normal function. The left ventricle has no regional  wall motion abnormalities. There is mild left ventricular hypertrophy.  Left ventricular diastolic  parameters  are consistent with Grade I diastolic dysfunction (impaired relaxation).   2. Right ventricular systolic function is normal. The right ventricular  size is normal.   3. Left atrial size was mildly dilated.   4. The mitral valve is degenerative. No evidence of mitral valve  regurgitation.   5. Aov DVI 0.27. The aortic valve is calcified. Aortic valve  regurgitation is not visualized. Moderate aortic valve stenosis. Aortic  valve mean gradient measures 22.0 mmHg. Aortic valve Vmax measures 3.18  m/s.   6. The inferior vena cava is normal in size with greater than 50%  respiratory variability, suggesting right atrial pressure of 3 mmHg.   Laboratory Data: High Sensitivity Troponin:   Recent Labs  Lab 05/20/24 2047 05/21/24 0019 05/21/24 0646  TROPONINIHS 21* 467* 562*     Chemistry Recent Labs  Lab 05/20/24 2047  NA 137  K 3.3*  CL 102  CO2 21*  GLUCOSE 146*  BUN 38*  CREATININE 0.93  CALCIUM  9.3  GFRNONAA 58*  ANIONGAP 14    No results for input(s): PROT, ALBUMIN, AST, ALT, ALKPHOS, BILITOT in the last 168 hours. Lipids No results for input(s): CHOL, TRIG, HDL, LABVLDL, LDLCALC, CHOLHDL in the last 168 hours.  Hematology Recent Labs  Lab 05/20/24 2047  WBC 9.0  RBC 3.93  HGB 12.8  HCT 37.7  MCV 95.9  MCH 32.6  MCHC 34.0  RDW 12.6  PLT 259   Thyroid  No results for input(s): TSH, FREET4 in the last 168 hours.  BNPNo results for input(s): BNP, PROBNP in the last 168 hours.  DDimer No results for input(s): DDIMER in the last 168 hours.  Radiology/Studies:  CT Angio Chest/Abd/Pel for Dissection W and/or W/WO Result Date: 05/21/2024 IMPRESSION: 1. No evidence of thoracoabdominal aortic aneurysm or dissection. 2. No evidence of pulmonary embolism. 3. No acute findings in the chest, abdomen,  or pelvis. Electronically signed by: Pinkie Pebbles MD 05/21/2024 02:49 AM EDT RP Workstation: [id number]   DG Chest 2  View Result Date: 05/20/2024 IMPRESSION: No active cardiopulmonary disease. Electronically Signed   By: Franky Crease M.D.   On: 05/20/2024 21:22   Assessment and Plan:  NSTEMI Coronary artery disease Hyperlipidemia - Prior abnormal myocardial perfusion stress test with subsequent MRI showing a possible small area of scarring near the apex 10+ years ago - Most recent echo 01/2024 with EF 60-65% without RWMA - Presenting with bilateral jaw pain radiating into the central chest - EKG with poor R wave progression, consistent with prior tracings.  No acute ST/T wave abnormalities. - Troponin 21 > 467 > 562 - Continue heparin GTT - Obtain echo - Continue aspirin , statin, and beta-blocker therapy - Discussed options with patient including ischemic evaluation versus conservative medical therapy.  After further thought, patient agrees to proceed with right and left heart catheterization.  Aortic valve stenosis - Most recent echo 01/2024 with moderate aortic valve stenosis with mean gradient 22 mmHg and Vmax 3.18 m/s - Repeat echo as above - R/LHC as above  Hypertension - Continue PTA antihypertensives   Risk Assessment/Risk Scores:    TIMI Risk Score for Unstable Angina or Non-ST Elevation MI:   The patient's TIMI risk score is  , which indicates a  % risk of all cause mortality, new or recurrent myocardial infarction or need for urgent revascularization in the next 14 days.  Informed Consent   Shared Decision Making/Informed Consent The risks, including but not limited to, [bleeding or vascular complications (1 in 500), pneumothorax (1 in 1600), arrhythmia (1 in 1000) and death (1 in 5000)], benefits (diagnostic support and/or management of heart failure, pulmonary hypertension) and alternatives of a right heart catheterization were discussed in detail with Angel [name] and she is willing to proceed. The risks [stroke (1 in 1000), death (1 in 1000), kidney failure [usually temporary] (1 in  500), bleeding (1 in 200), allergic reaction [possibly serious] (1 in 200)], benefits (diagnostic support and management of coronary artery disease) and alternatives of a cardiac catheterization were discussed in detail with Angel [name] and she is willing to proceed.      For questions or updates, please contact [facility] Please consult [email or web address] for contact info under      Signed, Angel LITTIE Maffucci, PA-C  05/21/2024 12:52 PM

## 2024-05-21 NOTE — Assessment & Plan Note (Signed)
 No acute issues suspected

## 2024-05-21 NOTE — Progress Notes (Addendum)
 Patient is uncooperative with staff. She has refused lab draws. I have explained to patient the importance of having this information since she is on heparin and had a heart catherization.  Patient refused to have physical assessment preformed. I explained again the importance of her receiving this care while in the hospital and needing further treatment. Patient refused to complete the patient profile. I explained to patient that these are questions every patient is asked upon admission.  Echocardiogram technician informed me that patient refused echo.

## 2024-05-21 NOTE — Assessment & Plan Note (Addendum)
 Continue home metoprolol  and lisinopril .  Add Norvasc .

## 2024-05-22 ENCOUNTER — Other Ambulatory Visit: Payer: Self-pay

## 2024-05-22 ENCOUNTER — Inpatient Hospital Stay (HOSPITAL_COMMUNITY)
Admission: EM | Admit: 2024-05-22 | Discharge: 2024-05-26 | DRG: 281 | Disposition: A | Discharge status: Other Acute Inpatient Hospital | Attending: Internal Medicine | Admitting: Internal Medicine

## 2024-05-22 ENCOUNTER — Encounter (HOSPITAL_COMMUNITY): Payer: Self-pay | Admitting: Internal Medicine

## 2024-05-22 DIAGNOSIS — I25110 Atherosclerotic heart disease of native coronary artery with unstable angina pectoris: Secondary | ICD-10-CM | POA: Diagnosis not present

## 2024-05-22 DIAGNOSIS — Z7902 Long term (current) use of antithrombotics/antiplatelets: Secondary | ICD-10-CM

## 2024-05-22 DIAGNOSIS — N179 Acute kidney failure, unspecified: Secondary | ICD-10-CM | POA: Diagnosis present

## 2024-05-22 DIAGNOSIS — I2510 Atherosclerotic heart disease of native coronary artery without angina pectoris: Secondary | ICD-10-CM | POA: Diagnosis present

## 2024-05-22 DIAGNOSIS — Z90710 Acquired absence of both cervix and uterus: Secondary | ICD-10-CM | POA: Diagnosis not present

## 2024-05-22 DIAGNOSIS — Z882 Allergy status to sulfonamides status: Secondary | ICD-10-CM | POA: Diagnosis not present

## 2024-05-22 DIAGNOSIS — Z743 Need for continuous supervision: Secondary | ICD-10-CM | POA: Diagnosis not present

## 2024-05-22 DIAGNOSIS — I2582 Chronic total occlusion of coronary artery: Secondary | ICD-10-CM | POA: Diagnosis present

## 2024-05-22 DIAGNOSIS — I719 Aortic aneurysm of unspecified site, without rupture: Secondary | ICD-10-CM | POA: Diagnosis present

## 2024-05-22 DIAGNOSIS — I214 Non-ST elevation (NSTEMI) myocardial infarction: Principal | ICD-10-CM | POA: Diagnosis present

## 2024-05-22 DIAGNOSIS — Z8249 Family history of ischemic heart disease and other diseases of the circulatory system: Secondary | ICD-10-CM

## 2024-05-22 DIAGNOSIS — Z8673 Personal history of transient ischemic attack (TIA), and cerebral infarction without residual deficits: Secondary | ICD-10-CM

## 2024-05-22 DIAGNOSIS — Z79899 Other long term (current) drug therapy: Secondary | ICD-10-CM

## 2024-05-22 DIAGNOSIS — I10 Essential (primary) hypertension: Secondary | ICD-10-CM | POA: Diagnosis present

## 2024-05-22 DIAGNOSIS — Z66 Do not resuscitate: Secondary | ICD-10-CM | POA: Diagnosis present

## 2024-05-22 DIAGNOSIS — R531 Weakness: Secondary | ICD-10-CM | POA: Diagnosis not present

## 2024-05-22 DIAGNOSIS — E782 Mixed hyperlipidemia: Secondary | ICD-10-CM | POA: Diagnosis present

## 2024-05-22 DIAGNOSIS — I350 Nonrheumatic aortic (valve) stenosis: Secondary | ICD-10-CM | POA: Diagnosis present

## 2024-05-22 DIAGNOSIS — I340 Nonrheumatic mitral (valve) insufficiency: Secondary | ICD-10-CM | POA: Diagnosis present

## 2024-05-22 DIAGNOSIS — E876 Hypokalemia: Secondary | ICD-10-CM | POA: Diagnosis present

## 2024-05-22 DIAGNOSIS — Z881 Allergy status to other antibiotic agents status: Secondary | ICD-10-CM

## 2024-05-22 DIAGNOSIS — Z888 Allergy status to other drugs, medicaments and biological substances status: Secondary | ICD-10-CM | POA: Diagnosis not present

## 2024-05-22 DIAGNOSIS — D649 Anemia, unspecified: Secondary | ICD-10-CM | POA: Diagnosis present

## 2024-05-22 DIAGNOSIS — Z7952 Long term (current) use of systemic steroids: Secondary | ICD-10-CM

## 2024-05-22 DIAGNOSIS — E785 Hyperlipidemia, unspecified: Secondary | ICD-10-CM | POA: Diagnosis present

## 2024-05-22 DIAGNOSIS — F419 Anxiety disorder, unspecified: Secondary | ICD-10-CM | POA: Diagnosis present

## 2024-05-22 DIAGNOSIS — Z7982 Long term (current) use of aspirin: Secondary | ICD-10-CM

## 2024-05-22 DIAGNOSIS — Z885 Allergy status to narcotic agent status: Secondary | ICD-10-CM | POA: Diagnosis not present

## 2024-05-22 LAB — CBC
HCT: 37.4 % (ref 36.0–46.0)
Hemoglobin: 12.6 g/dL (ref 12.0–15.0)
MCH: 32.6 pg (ref 26.0–34.0)
MCHC: 33.7 g/dL (ref 30.0–36.0)
MCV: 96.6 fL (ref 80.0–100.0)
Platelets: 254 K/uL (ref 150–400)
RBC: 3.87 MIL/uL (ref 3.87–5.11)
RDW: 12.9 % (ref 11.5–15.5)
WBC: 7.9 K/uL (ref 4.0–10.5)
nRBC: 0 % (ref 0.0–0.2)

## 2024-05-22 LAB — ECHOCARDIOGRAM COMPLETE
AR max vel: 0.81 cm2
AV Area VTI: 0.7 cm2
AV Area mean vel: 0.76 cm2
AV Mean grad: 16.8 mmHg
AV Peak grad: 28.9 mmHg
Ao pk vel: 2.69 m/s
Area-P 1/2: 4.74 cm2
Height: 63.5 in
S' Lateral: 2.9 cm
Weight: 2955.93 [oz_av]

## 2024-05-22 LAB — BASIC METABOLIC PANEL WITH GFR
Anion gap: 12 (ref 5–15)
BUN: 28 mg/dL — ABNORMAL HIGH (ref 8–23)
CO2: 22 mmol/L (ref 22–32)
Calcium: 9 mg/dL (ref 8.9–10.3)
Chloride: 103 mmol/L (ref 98–111)
Creatinine, Ser: 1.02 mg/dL — ABNORMAL HIGH (ref 0.44–1.00)
GFR, Estimated: 52 mL/min — ABNORMAL LOW (ref >60)
Glucose, Bld: 130 mg/dL — ABNORMAL HIGH (ref 70–99)
Potassium: 3.2 mmol/L — ABNORMAL LOW (ref 3.5–5.1)
Sodium: 137 mmol/L (ref 135–145)

## 2024-05-22 LAB — HEPARIN LEVEL (UNFRACTIONATED): Heparin Unfractionated: 0.31 [IU]/mL (ref 0.30–0.70)

## 2024-05-22 LAB — MRSA NEXT GEN BY PCR, NASAL: MRSA by PCR Next Gen: NOT DETECTED

## 2024-05-22 MED ORDER — NITROGLYCERIN 0.4 MG SL SUBL
0.4000 mg | SUBLINGUAL_TABLET | SUBLINGUAL | Status: DC | PRN
  Administered 2024-05-24 (×2): 0.4 mg via SUBLINGUAL
  Filled 2024-05-22 (×2): qty 1

## 2024-05-22 MED ORDER — ISOSORBIDE MONONITRATE ER 30 MG PO TB24
30.0000 mg | ORAL_TABLET | Freq: Every day | ORAL | Status: DC
  Administered 2024-05-22 – 2024-05-23 (×2): 30 mg via ORAL
  Filled 2024-05-22 (×2): qty 1

## 2024-05-22 MED ORDER — ASPIRIN 300 MG RE SUPP: 300.0000 mg | RECTAL | Status: DC

## 2024-05-22 MED ORDER — LISINOPRIL 20 MG PO TABS
40.0000 mg | ORAL_TABLET | Freq: Every day | ORAL | Status: AC
Start: 2024-05-22 — End: ?
  Administered 2024-05-22 – 2024-05-23 (×2): 40 mg via ORAL
  Filled 2024-05-22 (×2): qty 2

## 2024-05-22 MED ORDER — CLOPIDOGREL BISULFATE 75 MG PO TABS
300.0000 mg | ORAL_TABLET | Freq: Once | ORAL | Status: AC
Start: 2024-05-22 — End: 2024-05-22
  Administered 2024-05-22: 300 mg via ORAL
  Filled 2024-05-22: qty 4

## 2024-05-22 MED ORDER — CLOPIDOGREL BISULFATE 75 MG PO TABS
75.0000 mg | ORAL_TABLET | Freq: Every day | ORAL | Status: DC
Start: 2024-05-22 — End: 2024-05-22

## 2024-05-22 MED ORDER — CLOPIDOGREL BISULFATE 75 MG PO TABS: 600.0000 mg | ORAL_TABLET | Freq: Once | ORAL | Status: DC

## 2024-05-22 MED ORDER — HEPARIN (PORCINE) 25000 UT/250ML-% IV SOLN
1000.0000 [IU]/h | INTRAVENOUS | Status: DC
  Administered 2024-05-22: 1000 [IU]/h via INTRAVENOUS
  Filled 2024-05-22: qty 250

## 2024-05-22 MED ORDER — ASPIRIN 81 MG PO CHEW: 324.0000 mg | CHEWABLE_TABLET | ORAL | Status: DC

## 2024-05-22 MED ORDER — ACETAMINOPHEN 325 MG PO TABS
650.0000 mg | ORAL_TABLET | ORAL | Status: DC | PRN
  Administered 2024-05-23 – 2024-05-26 (×4): 650 mg via ORAL
  Filled 2024-05-22 (×5): qty 2

## 2024-05-22 MED ORDER — ASPIRIN 81 MG PO TBEC
81.0000 mg | DELAYED_RELEASE_TABLET | Freq: Every day | ORAL | Status: DC
  Administered 2024-05-24 – 2024-05-26 (×3): 81 mg via ORAL
  Filled 2024-05-22 (×4): qty 1

## 2024-05-22 MED ORDER — CLOPIDOGREL BISULFATE 75 MG PO TABS: 75.0000 mg | ORAL_TABLET | Freq: Every day | ORAL | Status: DC

## 2024-05-22 MED ORDER — VITAMIN B-12 1000 MCG PO TABS
1000.0000 ug | ORAL_TABLET | Freq: Every day | ORAL | Status: DC
  Administered 2024-05-22 – 2024-05-26 (×5): 1000 ug via ORAL
  Filled 2024-05-22 (×5): qty 1

## 2024-05-22 MED ORDER — METOPROLOL SUCCINATE ER 100 MG PO TB24
100.0000 mg | ORAL_TABLET | Freq: Every day | ORAL | Status: DC
  Administered 2024-05-22 – 2024-05-24 (×3): 100 mg via ORAL
  Filled 2024-05-22 (×4): qty 1

## 2024-05-22 MED ORDER — FUROSEMIDE 10 MG/ML IJ SOLN
20.0000 mg | Freq: Once | INTRAMUSCULAR | Status: AC
  Administered 2024-05-22: 20 mg via INTRAVENOUS
  Filled 2024-05-22: qty 2

## 2024-05-22 MED ORDER — ONDANSETRON HCL 4 MG/2ML IJ SOLN: 4.0000 mg | Freq: Four times a day (QID) | INTRAMUSCULAR | Status: DC | PRN

## 2024-05-22 MED ORDER — ATORVASTATIN CALCIUM 80 MG PO TABS
80.0000 mg | ORAL_TABLET | Freq: Every day | ORAL | Status: DC
Start: 2024-05-22 — End: 2024-05-26
  Administered 2024-05-22 – 2024-05-26 (×5): 80 mg via ORAL
  Filled 2024-05-22 (×5): qty 1

## 2024-05-22 MED ORDER — POTASSIUM CHLORIDE CRYS ER 20 MEQ PO TBCR
40.0000 meq | EXTENDED_RELEASE_TABLET | Freq: Once | ORAL | Status: AC
  Administered 2024-05-22: 40 meq via ORAL
  Filled 2024-05-22: qty 2

## 2024-05-22 NOTE — Progress Notes (Addendum)
 Rounding Note   Patient Name: Angel [name] Date of Encounter: 05/22/2024  [facility] Cardiologist: Lonni Hanson, MD   Subjective Feels well this am. No chest pain or dyspnea. Presented with few weeks of jaw pain with eating. Progressed over the weekend and admitted with NSTEMI  Scheduled Meds:  aspirin   324 mg Oral NOW   Or   aspirin   300 mg Rectal NOW   [START ON 05/23/2024] aspirin  EC  81 mg Oral Daily   clopidogrel   75 mg Oral Daily   vitamin B-12  1,000 mcg Oral Daily   Continuous Infusions:  heparin 1,000 Units/hr (05/22/24 0604)   PRN Meds: acetaminophen , nitroGLYCERIN , ondansetron (ZOFRAN) IV   Vital Signs  Vitals:   05/22/24 0307 05/22/24 0308 05/22/24 0508 05/22/24 0728  BP: (!) 148/85  (!) 142/81 (!) 169/79  Pulse: 93 89 78 79  Resp: 18 18 13 18   Temp:  98.5 F (36.9 C) 98 F (36.7 C) 98.5 F (36.9 C)  TempSrc:  Oral Axillary Oral  SpO2: 91% 93% 93% 96%  Weight: 82.9 kg     Height: 5' 3 (1.6 m)      No intake or output data in the 24 hours ending 05/22/24 0842    05/22/2024    3:07 AM 05/21/2024    7:29 PM 05/20/2024    8:46 PM  Last 3 Weights  Weight (lbs) 182 lb 12.2 oz 184 lb 11.9 oz 187 lb  Weight (kg) 82.9 kg 83.8 kg 84.823 kg      Telemetry NSR. One PVC triplet. One brief run of atrial tachycardia - Personally Reviewed  ECG  NSR, LVH no acute ST T changes. - Personally Reviewed  Physical Exam  GEN: No acute distress.   Neck: No JVD Cardiac: RRR, gr 2/6 harsh systolic murmur RUSB Respiratory: Clear to auscultation bilaterally. GI: Soft, nontender, non-distended  MS: No edema; No deformity. Neuro:  Nonfocal  Psych: Normal affect   Labs High Sensitivity Troponin:   Recent Labs  Lab 05/20/24 2047 05/21/24 0019 05/21/24 0646  TROPONINIHS 21* 467* 562*     Chemistry Recent Labs  Lab 05/20/24 2047 05/21/24 1511 05/21/24 1519 05/22/24 0442  NA 137 137 141 137  K 3.3* 3.3* 3.5 3.2*  CL 102  --   --  103   CO2 21*  --   --  22  GLUCOSE 146*  --   --  130*  BUN 38*  --   --  28*  CREATININE 0.93  --   --  1.02*  CALCIUM  9.3  --   --  9.0  GFRNONAA 58*  --   --  52*  ANIONGAP 14  --   --  12    Lipids No results for input(s): CHOL, TRIG, HDL, LABVLDL, LDLCALC, CHOLHDL in the last 168 hours.  Hematology Recent Labs  Lab 05/20/24 2047 05/21/24 1511 05/21/24 1519 05/22/24 0442  WBC 9.0  --   --  7.9  RBC 3.93  --   --  3.87  HGB 12.8 12.6 12.9 12.6  HCT 37.7 37.0 38.0 37.4  MCV 95.9  --   --  96.6  MCH 32.6  --   --  32.6  MCHC 34.0  --   --  33.7  RDW 12.6  --   --  12.9  PLT 259  --   --  254   Thyroid  No results for input(s): TSH, FREET4 in the last 168 hours.  BNPNo results  for input(s): BNP, PROBNP in the last 168 hours.  DDimer No results for input(s): DDIMER in the last 168 hours.   Radiology  CARDIAC CATHETERIZATION Result Date: 05/21/2024 Conclusions: Severe two-vessel coronary artery disease with chronic total occlusion of mid LCx and 99% ostial RCA disease with heavy calcification.  Suspect there is also moderate to severe disease in the mid RCA, though this is suboptimally visualized due to incomplete filling of the RCA secondary to severe ostial disease.  LAD with mild-moderate disease. Mildly elevated left heart and pulmonary artery pressures (PCWP 18 mmHg, LVEDP 20 mmHg, mean PA 26 mmHg). Normal Fick cardiac output/index CO 5.4 L/min, CI 2.9 L/min/m). Moderate-severe aortic valve stenosis (mean gradient 22 mmHg, aortic valve area 0.9 cm). Recommendations: Catheterization findings discussed at length with the patient and her family and also reviewed with the interventional team at [facility].  Will arrange for transfer to [facility] for possible PCI to RCA, which may require atherectomy and/or intravascular lithotripsy given heavy calcification.  Recommend consideration of femoral approach to optimize support for intervention. Resume heparin infusion  2 hours after TR band has been removed. Loaded with clopidogrel  with plans for dual antiplatelet therapy with aspirin  and clopidogrel  for 12 months. Follow-up echocardiogram to reassess LVEF and aortic stenosis. Aggressive secondary prevention of coronary artery disease. Lonni Hanson, MD [facility]  CT Angio Chest/Abd/Pel for Dissection W and/or W/WO Result Date: 05/21/2024 EXAM: CTA CHEST, ABDOMEN AND PELVIS WITH AND WITHOUT CONTRAST 05/21/2024 02:40:53 AM TECHNIQUE: CTA of the chest was performed with and without the administration of intravenous contrast. CTA of the abdomen and pelvis was performed with and without the administration of intravenous contrast. Multiplanar reformatted images are provided for review. MIP images are provided for review. Automated exposure control, iterative reconstruction, and/or weight based adjustment of the mA/kV was utilized to reduce the radiation dose to as low as reasonably achievable. (iohexol  (OMNIPAQUE ) 350 MG/ML injection 100 mL IOHEXOL  350 MG/ML SOLN) was administered. COMPARISON: None available. CLINICAL HISTORY: Acute aortic syndrome (AAS) suspected. Pt reports chest pain and bilateral jaw pain that began around 1900 tonight. Pt reports she has an aortic valve issue that she follows cardiology for. Pt reports she also feels anxious. FINDINGS: VASCULATURE: AORTA: Ectasia of the ascending thoracic aorta, measuring 3.6 cm (coronal image 55). No evidence of thoracoabdominal aortic aneurysm or dissection. Thoracic aortic atherosclerosis. PULMONARY ARTERIES: No evidence of pulmonary embolism. GREAT VESSELS OF AORTIC ARCH: No acute finding. No dissection. No arterial occlusion or significant stenosis. CELIAC TRUNK: No acute finding. No occlusion or significant stenosis. SUPERIOR MESENTERIC ARTERY: No acute finding. No occlusion or significant stenosis. INFERIOR MESENTERIC ARTERY: No acute finding. No occlusion or significant stenosis. RENAL ARTERIES: No acute  finding. No occlusion or significant stenosis. ILIAC ARTERIES: No acute finding. No occlusion or significant stenosis. CHEST: MEDIASTINUM: No mediastinal lymphadenopathy. The heart and pericardium demonstrate no acute abnormality. Moderate 3-vessel coronary atherosclerosis. LUNGS AND PLEURA: Mild subpleural scarring in the lungs bilaterally. No focal consolidation or pulmonary edema. No evidence of pleural effusion or pneumothorax. THORACIC BONES AND SOFT TISSUES: Small hiatal hernia. Mild degenerative changes of the lower thoracic/upper lumbar spine. No acute bone or soft tissue abnormality. ABDOMEN AND PELVIS: LIVER: 3.1 cm posterior right hepatic cyst (image 144), benign. GALLBLADDER AND BILE DUCTS: Gallbladder is unremarkable. No biliary ductal dilatation. SPLEEN: The spleen is unremarkable. PANCREAS: The pancreas is unremarkable. ADRENAL GLANDS: Bilateral adrenal glands demonstrate no acute abnormality. KIDNEYS, URETERS AND BLADDER: No stones in the kidneys or  ureters. No hydronephrosis. No perinephric or periureteral stranding. Periurethral calcifications, likely chronic. Urinary bladder is unremarkable. GI AND BOWEL: Stomach and duodenal sweep demonstrate no acute abnormality. Extensive colonic diverticulosis, without evidence of diverticulitis. The appendix is not discretely visualized, favored to be surgically absent. There is no bowel obstruction. No abnormal bowel wall thickening or distension. REPRODUCTIVE: Status post hysterectomy. PERITONEUM AND RETROPERITONEUM: No ascites or free air. LYMPH NODES: No lymphadenopathy. ABDOMINAL BONES AND SOFT TISSUES: Mild degenerative changes of the lower thoracic/upper lumbar spine. No acute abnormality of the bones. No acute soft tissue abnormality. IMPRESSION: 1. No evidence of thoracoabdominal aortic aneurysm or dissection. 2. No evidence of pulmonary embolism. 3. No acute findings in the chest, abdomen, or pelvis. Electronically signed by: Pinkie Pebbles MD  05/21/2024 02:49 AM EDT RP Workstation: [id number]   DG Chest 2 View Result Date: 05/20/2024 CLINICAL DATA:  Chest pain EXAM: DG CHEST 2V COMPARISON:  09/01/2021 FINDINGS: Heart and mediastinal contours are within normal limits. No focal opacities or effusions. No acute bony abnormality. Aortic atherosclerosis. IMPRESSION: No active cardiopulmonary disease. Electronically Signed   By: Franky Crease M.D.   On: 05/20/2024 21:22    Cardiac Studies See above  Patient Profile   89 y.o. female with moderate to severe AS presents with NSTEMI. Underwent cardiac cath at [facility] with findings of CTO of small LCx and critical ostial RCA stenosis which is culprit. Transferred for consideration of complex PCI  Assessment & Plan  NSTEMI. Coronary angiography personally reviewed. Left main and large LAD look good. CTO of small LCx. RCA  is a large vessel with critical ostial stenosis that is heavily calcified. Somewhat superior take off. Reviewed with patient today. Options include PCI versus medical therapy. She is otherwise in good health and very functional at home- still drives and does her own shopping/cooking. PCI is complex given ostial location, takeoff, and calcification. I think would require atherectomy. I doubt lesion could be cross with lithotripsy balloon. Would plan femoral approach and would place temporary pacing wire. I discussed with patient and her significant other at length. Reviewed procedure and risks including but not limited to death, MI, stroke, bleeding, vascular injury or arrhythmia. After extensive discussion she is agreeable to proceed. I will discuss with Dr Verlin and plan for PCI tomorrow. Will load with Plavix  today Aortic stenosis. I reviewed Echo and cath data. Mean AV gradient 20 mm Hg with AVA 0.8 cm squared c/w severe AS. Depending on results of coronary PCI she may be a candidate for TAVR at a later date.  HTN. Was on Toprol  XL 100 mg daily and lisinopril  40 mg daily as  outpatient. Will add Imdur.  HLD prior LDL 96 last year. Was on lipitor 80 mg daily. Update labs.  MIldly elevated LV filling pressures on cath - will give IV lasix x 1 today Hypokalemia. Will replete.  Code status. Limited Code status documented- no CPR or intubation but ok with rescinding for PCI.    For questions or updates, please contact [facility] Please consult [email or web address] for contact info under       Signed, Sheron Tallman [place], MD  05/22/2024, 8:42 AM

## 2024-05-22 NOTE — H&P (Signed)
 Cardiology Admission History and Physical   Patient ID: GERALDYN SHAIN MRN: [id number]; DOB: [birth year]   Admission date: 05/22/2024  PCP:  Lendia Boby CROME, NP-C   [facility] Providers Cardiologist:  Lonni Hanson, MD       Chief Complaint:  chest pain  Patient Profile: Angel [name] is a 89 y.o. female with a history of coronary artery disease, hypertension, moderate AS, and prior stroke who is being seen 05/22/2024 for the evaluation of chest pain.  History of Present Illness: Angel [name] presented with acute onset chest pain that radiated to the jaw sometime during the evening of 05/21/2024.  She describes the symptoms as constant, pressure-like pain that was persistent.  Other associated symptoms included fatigue.  This prompted an ER visit.  She denied syncope, apnea, paroxysmal nocturnal dyspnea, fevers, chills, shortness of breath.  Patient states her most recent symptoms are fairly new.  She lives in a house with lots of land, and ambulates with a mechanical device.  She states she has not endorsed chest pain with exertion, however her movements are fairly minimal.  She presented to [facility], she was found to have evidence of acute myocardial injury with a high-sensitivity troponin of 467-> 562.  Other notable labs include K 3.3, sCr 0.9Went a left heart catheterization that showed severe two-vessel coronary artery disease.'s cysts specifically, she had evidence of a CTO of the mid circumflex, and 99% ostial RCA disease.  LVEDP was 20.  She was also found to have evidence of moderate aortic stenosis with a mean gradient of 22, and an aortic valve area 0.9.  OF cardiac index 2.9.  Patient was continued on heparin and received aspirin  and Plavix  for antiplatelet therapy.  She was transferred to [facility] for further management of her obstructive coronary artery disease.   Past Medical History:  Diagnosis Date   Acute CVA (cerebrovascular accident) (HCC)  06/28/2020   Anxiety    Aortic aneurysm without rupture    Aortic stenosis    mild-moderate by echo   Arthritis    CAD (coronary artery disease)    Chronic hip pain    Endometriosis    Hearing loss    HTN, goal below 150/90    Hyperlipidemia LDL goal <70 11/18/2014   NSTEMI (non-ST elevated myocardial infarction) (HCC) 05/21/2024   Osteopenia of the elderly    Urge and stress incontinence    Past Surgical History:  Procedure Laterality Date   ABDOMINAL HYSTERECTOMY  1976   total   APPENDECTOMY     BREAST EXCISIONAL BIOPSY Left 1987   benign   CATARACT EXTRACTION, BILATERAL  08/2018   Dr. Maree with [facility]     Medications Prior to Admission: Prior to Admission medications   Medication Sig Start Date End Date Taking? Authorizing Provider  acetaminophen  (TYLENOL ) 325 MG tablet Take 2 tablets (650 mg total) by mouth every 4 (four) hours as needed for headache or mild pain (pain score 1-3). 05/21/24   Wieting, Richard, MD  ALPRAZolam  (XANAX ) 0.25 MG tablet TAKE 1 TABLET BY MOUTH 2 TIMES DAILY AS NEEDED. 05/04/23   Henson, [name] L, NP-C  amLODipine  (NORVASC ) 5 MG tablet Take 1 tablet (5 mg total) by mouth daily. 05/21/24   Josette Ade, MD  aspirin  EC 81 MG EC tablet Take 1 tablet (81 mg total) by mouth daily. Swallow whole. 07/01/20   Vann, Jessica U, DO  atorvastatin  (LIPITOR) 80 MG tablet TAKE 1 TABLET BY MOUTH EVERY DAY  05/20/24   End, Lonni, MD  busPIRone  (BUSPAR ) 10 MG tablet TAKE 1 TABLET BY MOUTH TWICE A DAY 05/21/24   Henson, [name] L, NP-C  clopidogrel  (PLAVIX ) 75 MG tablet Take 1 tablet (75 mg total) by mouth daily. 05/22/24   Josette Ade, MD  lisinopril  (ZESTRIL ) 40 MG tablet TAKE 1 TABLET BY MOUTH EVERY DAY 05/14/24   End, Lonni, MD  metoprolol  succinate (TOPROL -XL) 100 MG 24 hr tablet TAKE 1 TABLET BY MOUTH EVERY DAY WITH OR IMMEDIATELY FOLLOWING A MEAL 05/14/24   End, Lonni, MD  nitroGLYCERIN  (NITROSTAT ) 0.4 MG SL tablet Place 1  tablet (0.4 mg total) under the tongue every 5 (five) minutes as needed for chest pain. 11/20/19   Lelon Hamilton T, PA-C  predniSONE (DELTASONE) 5 MG tablet Take 1 tablet (5 mg total) by mouth daily with breakfast. 05/14/24   Henson, [name] L, NP-C  vitamin B-12 (CYANOCOBALAMIN) 100 MCG tablet Take 1,000 mcg by mouth daily.     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  Tobacco Use   Smoking status: Never   Smokeless tobacco: Never  Vaping Use   Vaping status: Never Used  Substance and Sexual Activity   Alcohol use: Yes    Alcohol/week: 1.0 standard drink of alcohol    Types: 1 Cans of beer per week    Comment: once a month   Drug use: No   Sexual activity: Not on file  Other Topics Concern   Not on file  Social History Narrative   Not on file   Social Drivers of Health   Financial Resource Strain: Low Risk  (06/22/2023)   Overall Financial Resource Strain (CARDIA)    Difficulty of Paying Living Expenses: Not very hard  Food Insecurity: No Food Insecurity (06/22/2023)   Hunger Vital Sign    Worried About Running Out of Food in the Last Year: Never true    Ran Out of Food in the Last Year: Never true  Transportation Needs: No Transportation Needs (06/22/2023)   PRAPARE - Administrator, Civil Service (Medical): No    Lack of Transportation (Non-Medical): No  Physical  Activity: Inactive (06/22/2023)   Exercise Vital Sign    Days of Exercise per Week: 0 days    Minutes of Exercise per Session: 0 min  Stress: No Stress Concern Present (06/22/2023)   [employer] of Occupational Health - Occupational Stress Questionnaire    Feeling of Stress : Only a little  Social Connections: Socially Isolated (06/22/2023)   Social Connection and Isolation Panel    Frequency of Communication with Friends and Family: More than three times a week    Frequency of Social Gatherings with Friends and Family: More than three times a week  Attends Religious Services: Never    Active Member of Clubs or Organizations: No    Attends Banker Meetings: Never    Marital Status: Widowed  Intimate Partner Violence: Patient Unable To Answer (06/22/2023)   Humiliation, Afraid, Rape, and Kick questionnaire    Fear of Current or Ex-Partner: Patient unable to answer    Emotionally Abused: Patient unable to answer    Physically Abused: Patient unable to answer    Sexually Abused: Patient unable to answer     Family History:   The patient's family history includes Breast cancer (age of onset: 41) in her maternal grandmother; Cancer in her brother; Heart disease in her maternal grandmother; Stroke in her mother.    ROS:  Please see the history of present illness.  All other ROS reviewed and negative.     Physical Exam/Data: There were no vitals filed for this visit. No intake or output data in the 24 hours ending 05/22/24 0410    05/21/2024    7:29 PM 05/20/2024    8:46 PM 04/26/2024    8:47 AM  Last 3 Weights  Weight (lbs) 184 lb 11.9 oz 187 lb 187 lb  Weight (kg) 83.8 kg 84.823 kg 84.823 kg     There is no height or weight on file to calculate BMI.  General:  Well nourished, well developed, in no acute distress. Uses hearing aids HEENT: normal Neck: no JVD Vascular: No carotid bruits; Distal pulses 2+ bilaterally   Cardiac:  II/VI systolic murmur Lungs:   clear to auscultation bilaterally, no wheezing, rhonchi or rales  Abd: soft, nontender, no hepatomegaly  Ext: no edema Musculoskeletal:  No deformities, BUE and BLE strength normal and equal Skin: warm and dry  Neuro:  CNs 2-12 intact, no focal abnormalities noted Psych:  Normal affect   EKG:  The ECG that was done  was personally reviewed and demonstrates sinus rhythm  Relevant CV Studies: reviewed  Laboratory Data: High Sensitivity Troponin:   Recent Labs  Lab 05/20/24 2047 05/21/24 0019 05/21/24 0646  TROPONINIHS 21* 467* 562*      Chemistry Recent Labs  Lab 05/20/24 2047 05/21/24 1511 05/21/24 1519  NA 137 137 141  K 3.3* 3.3* 3.5  CL 102  --   --   CO2 21*  --   --   GLUCOSE 146*  --   --   BUN 38*  --   --   CREATININE 0.93  --   --   CALCIUM  9.3  --   --   GFRNONAA 58*  --   --   ANIONGAP 14  --   --     No results for input(s): PROT, ALBUMIN, AST, ALT, ALKPHOS, BILITOT in the last 168 hours. Lipids No results for input(s): CHOL, TRIG, HDL, LABVLDL, LDLCALC, CHOLHDL in the last 168 hours. Hematology Recent Labs  Lab 05/20/24 2047 05/21/24 1511 05/21/24 1519  WBC 9.0  --   --   RBC 3.93  --   --   HGB 12.8 12.6 12.9  HCT 37.7 37.0 38.0  MCV 95.9  --   --   MCH 32.6  --   --   MCHC 34.0  --   --   RDW 12.6  --   --   PLT 259  --   --    Thyroid  No results for input(s): TSH, FREET4 in the last 168 hours. BNPNo results for input(s): BNP, PROBNP in the last 168 hours.  DDimer  No results for input(s): DDIMER in the last 168 hours.  Radiology/Studies:  CARDIAC CATHETERIZATION Result Date: 05/21/2024 Conclusions: Severe two-vessel coronary artery disease with chronic total occlusion of mid LCx and 99% ostial RCA disease with heavy calcification.  Suspect there is also moderate to severe disease in the mid RCA, though this is suboptimally visualized due to incomplete filling of the RCA secondary to severe ostial disease.   LAD with mild-moderate disease. Mildly elevated left heart and pulmonary artery pressures (PCWP 18 mmHg, LVEDP 20 mmHg, mean PA 26 mmHg). Normal Fick cardiac output/index CO 5.4 L/min, CI 2.9 L/min/m). Moderate-severe aortic valve stenosis (mean gradient 22 mmHg, aortic valve area 0.9 cm). Recommendations: Catheterization findings discussed at length with the patient and her family and also reviewed with the interventional team at [facility].  Will arrange for transfer to [facility] for possible PCI to RCA, which may require atherectomy and/or intravascular lithotripsy given heavy calcification.  Recommend consideration of femoral approach to optimize support for intervention. Resume heparin infusion 2 hours after TR band has been removed. Loaded with clopidogrel  with plans for dual antiplatelet therapy with aspirin  and clopidogrel  for 12 months. Follow-up echocardiogram to reassess LVEF and aortic stenosis. Aggressive secondary prevention of coronary artery disease. Lonni Hanson, MD [facility]    Assessment and Plan:  Angel [name] is a 89 y.o. female with a history of coronary artery disease, hypertension, moderate AS, and prior stroke who is being seen 05/22/2024 for the evaluation of chest pain.   NSTEMI Currently chest pain-free, on heparin drip.  Telemetry without ectopic ventricular beats. Received aspirin  Plavix  for dual antiplatelet therapy.  She will undergo repeat heart catheterization with possible PCI this Thursday. -continue DAPT -continue IV heparin -continue statin  -continue metoprolol  succinate  2. Moderate to severe AS Continue to monitor  3. Hypertension -restart home anti-hypertensives  Risk Assessment/Risk Scores:   TIMI Risk Score for Unstable Angina or Non-ST Elevation MI:   The patient's TIMI risk score is 5, which indicates a 26% risk of all cause mortality, new or recurrent myocardial infarction or need for urgent revascularization in the next 14  days.     Code Status: Do Not Resuscitate (DNR)  Severity of Illness: The appropriate patient status for this patient is INPATIENT. Inpatient status is judged to be reasonable and necessary in order to provide the required intensity of service to ensure the patient's safety. The patient's presenting symptoms, physical exam findings, and initial radiographic and laboratory data in the context of their chronic comorbidities is felt to place them at high risk for further clinical deterioration. Furthermore, it is not anticipated that the patient will be medically stable for discharge from the hospital within 2 midnights of admission.   * I certify that at the point of admission it is my clinical judgment that the patient will require inpatient hospital care spanning beyond 2 midnights from the point of admission due to high intensity of service, high risk for further deterioration and high frequency of surveillance required.*  For questions or updates, please contact [facility] Please consult [email or web address] for contact info under       Signed, Angel Gouge A Meily Glowacki, MD  05/22/2024 4:10 AM

## 2024-05-22 NOTE — Progress Notes (Signed)
 PHARMACY - ANTICOAGULATION CONSULT NOTE  Pharmacy Consult for heparin Indication: CAD awaiting possible PCI  Allergies  Allergen Reactions   Codeine Other (See Comments)    zoned out   Levofloxacin Other (See Comments)    AMS   Mirabegron  Other (See Comments)    Severe depression   Prednisone Other (See Comments)    AMS   Sulfa Antibiotics Other (See Comments)    AMS    Patient Measurements: Height: 5' 3 (160 cm) Weight: 82.9 kg (182 lb 12.2 oz) IBW/kg (Calculated) : 52.4 HEPARIN DW (KG): 70.7  Vital Signs: Temp: 98 F (36.7 C) (10/15 0508) Temp Source: Axillary (10/15 0508) BP: 142/81 (10/15 0508) Pulse Rate: 78 (10/15 0508)  Labs: Recent Labs    05/20/24 2047 05/21/24 0019 05/21/24 0200 05/21/24 0646 05/21/24 1511 05/21/24 1519 05/22/24 0442  HGB 12.8  --   --   --  12.6 12.9 12.6  HCT 37.7  --   --   --  37.0 38.0 37.4  PLT 259  --   --   --   --   --  254  APTT  --   --  31  --   --   --   --   LABPROT  --   --  13.3  --   --   --   --   INR  --   --  1.0  --   --   --   --   HEPARINUNFRC  --   --   --   --   --   --  0.31  CREATININE 0.93  --   --   --   --   --  1.02*  TROPONINIHS 21* 467*  --  562*  --   --   --     Estimated Creatinine Clearance: 36.6 mL/min (A) (by C-G formula based on SCr of 1.02 mg/dL (H)).   Medical History: Past Medical History:  Diagnosis Date   Acute CVA (cerebrovascular accident) (HCC) 06/28/2020   Anxiety    Aortic aneurysm without rupture    Aortic stenosis    mild-moderate by echo   Arthritis    CAD (coronary artery disease)    Chronic hip pain    Endometriosis    Hearing loss    HTN, goal below 150/90    Hyperlipidemia LDL goal <70 11/18/2014   NSTEMI (non-ST elevated myocardial infarction) (HCC) 05/21/2024   Osteopenia of the elderly    Urge and stress incontinence     Assessment: 89yo female tx'd from [facility] after cardiac cath revealed severe 2v CAD with heavy calcification, awaiting possible PCI to  RCA, arrived to [facility] on heparin infusion at 950 units/hr; current level is at goal but at the very low end.  Goal of Therapy:  Heparin level 0.3-0.7 units/ml Monitor platelets by anticoagulation protocol: Yes   Plan:  Increase heparin infusion slightly to 1000 units/hr to maintain therapeutic level. Check level in 8h.  Marvetta Dauphin, PharmD, BCPS  05/22/2024,5:58 AM

## 2024-05-22 NOTE — Plan of Care (Signed)
  Problem: Education: Goal: Knowledge of General Education information will improve Description: Including pain rating scale, medication(s)/side effects and non-pharmacologic comfort measures Outcome: Progressing   Problem: Health Behavior/Discharge Planning: Goal: Ability to manage health-related needs will improve Outcome: Progressing   Problem: Clinical Measurements: Goal: Ability to maintain clinical measurements within normal limits will improve Outcome: Progressing Goal: Respiratory complications will improve Outcome: Progressing   Problem: Activity: Goal: Risk for activity intolerance will decrease Outcome: Progressing   Problem: Nutrition: Goal: Adequate nutrition will be maintained Outcome: Progressing   Problem: Elimination: Goal: Will not experience complications related to urinary retention Outcome: Progressing   Problem: Pain Managment: Goal: General experience of comfort will improve and/or be controlled Outcome: Progressing   Problem: Safety: Goal: Ability to remain free from injury will improve Outcome: Progressing

## 2024-05-22 NOTE — TOC CM/SW Note (Signed)
 Transition of Care [facility]) - Inpatient Brief Assessment   Patient Details  Name: Angel [name] MRN: [id number] Date of Birth: 1931-07-22  Transition of Care [facility]) CM/SW Contact:    Lauraine FORBES Saa, LCSWA Phone Number: 05/22/2024, 9:15 AM   Clinical Narrative:  9:15 AM Per chart review, patient resides at home resides at home with significant other. Patient has a PCP and insurance. Patient does not have SNF history. Patient has HH history with [place]. [place] currently has HH aide. Patient has DME (RW) history with [employer]. Patient's preferred pharmacy's are [facility], [facility], and [facility] [phone]. No TOC needs identified at this time. TOC will continue to follow.  Transition of Care Asessment: Insurance and Status: Insurance coverage has been reviewed Patient has primary care physician: Yes Home environment has been reviewed: Private Residence Prior level of function:: N/A Prior/Current Home Services: Current home services (HH Aide) Social Drivers of Health Review: SDOH reviewed no interventions necessary Readmission risk has been reviewed: Yes (Currently Green 13%) Transition of care needs: no transition of care needs at this time

## 2024-05-23 ENCOUNTER — Other Ambulatory Visit: Payer: Self-pay

## 2024-05-23 ENCOUNTER — Encounter (HOSPITAL_COMMUNITY): Payer: Self-pay | Admitting: Cardiovascular Disease

## 2024-05-23 ENCOUNTER — Encounter (HOSPITAL_COMMUNITY)
Admission: EM | Disposition: A | Discharge status: Other Acute Inpatient Hospital | Payer: Self-pay | Source: Home / Self Care | Attending: Internal Medicine

## 2024-05-23 DIAGNOSIS — I350 Nonrheumatic aortic (valve) stenosis: Secondary | ICD-10-CM | POA: Diagnosis not present

## 2024-05-23 DIAGNOSIS — I214 Non-ST elevation (NSTEMI) myocardial infarction: Secondary | ICD-10-CM | POA: Diagnosis not present

## 2024-05-23 DIAGNOSIS — I25110 Atherosclerotic heart disease of native coronary artery with unstable angina pectoris: Secondary | ICD-10-CM

## 2024-05-23 DIAGNOSIS — E785 Hyperlipidemia, unspecified: Secondary | ICD-10-CM | POA: Diagnosis not present

## 2024-05-23 HISTORY — PX: CORONARY ANGIOGRAPHY: CATH118303

## 2024-05-23 LAB — POCT ACTIVATED CLOTTING TIME
Activated Clotting Time: 274 s
Activated Clotting Time: 227 s
Activated Clotting Time: 175 s
Activated Clotting Time: 181 s

## 2024-05-23 LAB — CBC
HCT: 36.7 % (ref 36.0–46.0)
Hemoglobin: 12.1 g/dL (ref 12.0–15.0)
MCH: 32.2 pg (ref 26.0–34.0)
MCHC: 33 g/dL (ref 30.0–36.0)
MCV: 97.6 fL (ref 80.0–100.0)
Platelets: 277 K/uL (ref 150–400)
RBC: 3.76 MIL/uL — ABNORMAL LOW (ref 3.87–5.11)
RDW: 13.1 % (ref 11.5–15.5)
WBC: 8.5 K/uL (ref 4.0–10.5)
nRBC: 0 % (ref 0.0–0.2)

## 2024-05-23 LAB — BASIC METABOLIC PANEL WITH GFR
Anion gap: 11 (ref 5–15)
BUN: 36 mg/dL — ABNORMAL HIGH (ref 8–23)
CO2: 22 mmol/L (ref 22–32)
Calcium: 8.8 mg/dL — ABNORMAL LOW (ref 8.9–10.3)
Chloride: 106 mmol/L (ref 98–111)
Creatinine, Ser: 1 mg/dL (ref 0.44–1.00)
GFR, Estimated: 53 mL/min — ABNORMAL LOW (ref >60)
Glucose, Bld: 116 mg/dL — ABNORMAL HIGH (ref 70–99)
Potassium: 3.8 mmol/L (ref 3.5–5.1)
Sodium: 139 mmol/L (ref 135–145)

## 2024-05-23 LAB — HEPARIN LEVEL (UNFRACTIONATED): Heparin Unfractionated: 0.5 [IU]/mL (ref 0.30–0.70)

## 2024-05-23 LAB — LIPOPROTEIN A (LPA)
Lipoprotein (a): 268 nmol/L — ABNORMAL HIGH (ref <75.0)
Lipoprotein (a): 255.8 nmol/L — ABNORMAL HIGH (ref <75.0)

## 2024-05-23 SURGERY — CORONARY ANGIOGRAPHY (CATH LAB)
Anesthesia: Moderate Sedation

## 2024-05-23 MED ORDER — CLOPIDOGREL BISULFATE 75 MG PO TABS
600.0000 mg | ORAL_TABLET | Freq: Once | ORAL | Status: AC
  Administered 2024-05-23: 600 mg via ORAL
  Filled 2024-05-23 (×2): qty 8

## 2024-05-23 MED ORDER — ACETAMINOPHEN 325 MG PO TABS
ORAL_TABLET | ORAL | Status: AC
  Filled 2024-05-23: qty 2

## 2024-05-23 MED ORDER — FREE WATER
500.0000 mL | Freq: Once | Status: AC
  Administered 2024-05-23: 500 mL via ORAL

## 2024-05-23 MED ORDER — LIDOCAINE HCL (PF) 1 % IJ SOLN
INTRAMUSCULAR | Status: AC
  Filled 2024-05-23: qty 30

## 2024-05-23 MED ORDER — LIDOCAINE HCL (PF) 1 % IJ SOLN
INTRAMUSCULAR | Status: DC | PRN
  Administered 2024-05-23: 5 mL via INTRADERMAL

## 2024-05-23 MED ORDER — HYDRALAZINE HCL 20 MG/ML IJ SOLN: 10.0000 mg | INTRAMUSCULAR | Status: AC | PRN

## 2024-05-23 MED ORDER — ASPIRIN 81 MG PO CHEW
81.0000 mg | CHEWABLE_TABLET | ORAL | Status: AC
  Administered 2024-05-23: 81 mg via ORAL
  Filled 2024-05-23: qty 1

## 2024-05-23 MED ORDER — HEPARIN SODIUM (PORCINE) 1000 UNIT/ML IJ SOLN
INTRAMUSCULAR | Status: AC
  Filled 2024-05-23: qty 10

## 2024-05-23 MED ORDER — HEPARIN (PORCINE) IN NACL 1000-0.9 UT/500ML-% IV SOLN
INTRAVENOUS | Status: DC | PRN
  Administered 2024-05-23 (×2): 500 mL

## 2024-05-23 MED ORDER — MIDAZOLAM HCL 2 MG/2ML IJ SOLN
INTRAMUSCULAR | Status: AC
Start: 2024-05-23 — End: 2024-05-23
  Filled 2024-05-23: qty 2

## 2024-05-23 MED ORDER — MIDAZOLAM HCL (PF) 2 MG/2ML IJ SOLN
INTRAMUSCULAR | Status: DC | PRN
Start: 2024-05-23 — End: 2024-05-23
  Administered 2024-05-23: 1 mg via INTRAVENOUS

## 2024-05-23 MED ORDER — FENTANYL CITRATE (PF) 50 MCG/ML IJ SOSY: 25.0000 ug | PREFILLED_SYRINGE | INTRAMUSCULAR | Status: DC | PRN

## 2024-05-23 MED ORDER — LABETALOL HCL 5 MG/ML IV SOLN: 10.0000 mg | INTRAVENOUS | Status: DC | PRN

## 2024-05-23 MED ORDER — FENTANYL CITRATE (PF) 100 MCG/2ML IJ SOLN
INTRAMUSCULAR | Status: AC
  Filled 2024-05-23: qty 2

## 2024-05-23 MED ORDER — SODIUM CHLORIDE 0.9 % IV SOLN: 250.0000 mL | INTRAVENOUS | Status: AC | PRN

## 2024-05-23 MED ORDER — CLOPIDOGREL BISULFATE 75 MG PO TABS
75.0000 mg | ORAL_TABLET | Freq: Every day | ORAL | Status: DC
  Administered 2024-05-24 – 2024-05-26 (×3): 75 mg via ORAL
  Filled 2024-05-23 (×3): qty 1

## 2024-05-23 MED ORDER — FENTANYL CITRATE (PF) 100 MCG/2ML IJ SOLN
25.0000 ug | INTRAMUSCULAR | Status: DC | PRN
  Administered 2024-05-23: 50 ug via INTRAVENOUS

## 2024-05-23 MED ORDER — ONDANSETRON HCL 4 MG/2ML IJ SOLN
INTRAMUSCULAR | Status: AC
  Filled 2024-05-23: qty 2

## 2024-05-23 MED ORDER — ATROPINE SULFATE 1 MG/10ML IJ SOSY
PREFILLED_SYRINGE | INTRAMUSCULAR | Status: AC
  Filled 2024-05-23: qty 10

## 2024-05-23 MED ORDER — SODIUM CHLORIDE 0.9% FLUSH: 3.0000 mL | INTRAVENOUS | Status: DC | PRN

## 2024-05-23 MED ORDER — FREE WATER
250.0000 mL | Freq: Once | Status: AC
  Administered 2024-05-23: 250 mL via ORAL

## 2024-05-23 MED ORDER — SODIUM CHLORIDE 0.9 % IV SOLN
5.0000 mg/kg | Freq: Once | INTRAVENOUS | Status: DC
  Filled 2024-05-23: qty 16.84

## 2024-05-23 MED ORDER — SODIUM CHLORIDE 0.9% FLUSH
3.0000 mL | Freq: Two times a day (BID) | INTRAVENOUS | Status: DC
  Administered 2024-05-23 – 2024-05-26 (×5): 3 mL via INTRAVENOUS

## 2024-05-23 MED ORDER — SODIUM CHLORIDE 0.9 % IV BOLUS
INTRAVENOUS | Status: AC | PRN
  Administered 2024-05-23: 250 mL via INTRAVENOUS

## 2024-05-23 MED ORDER — POTASSIUM CHLORIDE CRYS ER 20 MEQ PO TBCR
20.0000 meq | EXTENDED_RELEASE_TABLET | Freq: Once | ORAL | Status: AC
Start: 2024-05-23 — End: 2024-05-23
  Administered 2024-05-23: 20 meq via ORAL
  Filled 2024-05-23: qty 1

## 2024-05-23 MED ORDER — IOHEXOL 350 MG/ML SOLN
INTRAVENOUS | Status: DC | PRN
  Administered 2024-05-23: 30 mL

## 2024-05-23 MED ORDER — FENTANYL CITRATE (PF) 100 MCG/2ML IJ SOLN
INTRAMUSCULAR | Status: DC | PRN
  Administered 2024-05-23: 25 ug via INTRAVENOUS

## 2024-05-23 SURGICAL SUPPLY — 14 items
CABLE ADAPT PACING TEMP 12FT (ADAPTER) IMPLANT
CATH LAUNCHER 6FR AR1 (CATHETERS) IMPLANT
CATH VISTA GUIDE 6FR JR4 ECOPK (CATHETERS) IMPLANT
ELECT DEFIB PAD ADLT CADENCE (PAD) IMPLANT
GLIDESHEATH SLEND SS 6F .021 (SHEATH) IMPLANT
KIT ESSENTIALS PG (KITS) IMPLANT
MAT PREVALON FULL STRYKER (MISCELLANEOUS) IMPLANT
PACK CARDIAC CATHETERIZATION (CUSTOM PROCEDURE TRAY) ×1 IMPLANT
SET ATX-X65L (MISCELLANEOUS) IMPLANT
SHEATH PINNACLE 6F 10CM (SHEATH) IMPLANT
SHEATH PROBE COVER 6X72 (BAG) IMPLANT
WIRE ASAHI PROWATER 300CM (WIRE) IMPLANT
WIRE EMERALD 3MM-J .035X260CM (WIRE) IMPLANT
WIRE MICRO SET SILHO 5FR 7 (SHEATH) IMPLANT

## 2024-05-23 NOTE — Progress Notes (Signed)
  Progress Note  Patient Name: Angel [name] Date of Encounter: 05/23/2024 [facility] Cardiologist: Lonni Hanson, MD   Interval Summary    Having jaw pain intermittently, otherwise doing ok.   Vital Signs Vitals:   05/23/24 0032 05/23/24 0251 05/23/24 0700 05/23/24 0746  BP: 120/63 113/72  (!) 104/50  Pulse: 70 73  82  Resp: 20 15  16   Temp: 98.2 F (36.8 C) 98.5 F (36.9 C)  98.6 F (37 C)  TempSrc: Oral Oral  Oral  SpO2: 97% 93%  97%  Weight:   84.2 kg   Height:        Intake/Output Summary (Last 24 hours) at 05/23/2024 0947 Last data filed at 05/23/2024 0500 Gross per 24 hour  Intake 444.43 ml  Output --  Net 444.43 ml      05/23/2024    7:00 AM 05/22/2024    3:07 AM 05/21/2024    7:29 PM  Last 3 Weights  Weight (lbs) 185 lb 10 oz 182 lb 12.2 oz 184 lb 11.9 oz  Weight (kg) 84.2 kg 82.9 kg 83.8 kg      Telemetry/ECG  Sinus Rhythm, PVCs - Personally Reviewed  Cardiac studies  Diagnostic Dominance: Right   Physical Exam  GEN: No acute distress.   Neck: No JVD Cardiac: RRR, + systolic murmur, no rubs, or gallops.  Respiratory: Clear to auscultation bilaterally. GI: Soft, nontender, non-distended  MS: No edema  Assessment & Plan   89 y.o. female with moderate to severe AS presents with NSTEMI. Underwent cardiac cath at [facility] with findings of CTO of small LCx and critical ostial RCA stenosis which is culprit. Transferred for consideration of complex PCI   NSTEMI -- High-sensitivity troponin 21>> 467>> 562 -- Underwent cardiac catheterization on 1014 at [facility] with severe two-vessel CAD with CTO of mid circumflex and 99% ostial RCA disease with heavy calcification.  She has been transferred to [facility] with plans for PCI of the RCA today requiring atherectomy, and temp wire -- Loaded with Plavix , plan for 75 mg daily in addition to aspirin  81 mg daily -- Continue aspirin , Plavix , lovastatin 80 mg daily, Imdur 30 mg daily, metoprolol   succinate 100 mg daily, lisinopril  40 mg daily  Aortic stenosis -- Echo 10/14 with mild to moderate aortic valve stenosis, mean gradient of 16.8 mmHg. -- Cath data with moderate to severe aortic valve stenosis with a mean gradient of 22 mmHg and aortic valve area of 0.9 cm -- May ultimately need to be considered for TAVR pending cath results  HTN -- Well-controlled -- Continue metoprolol  succinate 100 mg daily, lisinopril  40 mg daily, Imdur 30 mg daily  HLD -- LP(a) 255, check lipids in a.m. -- Continue Lipitor 80 mg daily  Code status. Limited Code status documented- no CPR or intubation but ok with rescinding for PCI.   For questions or updates, please contact [facility] Please consult [email or web address] for contact info under   Signed, Manuelita Rummer, NP

## 2024-05-23 NOTE — Progress Notes (Signed)
 Site area: Right femoral Site Prior to Removal:  Level 2 Pressure Applied For:30 minutes Manual:   Yes Patient Status During Pull:  stable Post Pull Site:  Level 1 Post Pull Instructions Given: Yes  Post Pull Pulses Present: Bilateral Dp's +1 Dressing Applied:  gauze and tegaderm Bedrest begins @ 1830 Comments: level 2 hematoma prior to sheath removal. Cath lab manager Beverley aware, as well as Dr. Verlin notified, who gave orders to administer pain medication. 3 nurses at groin site to stabilize arterial site. Patient stable throughout sheath pull.

## 2024-05-23 NOTE — Plan of Care (Signed)
  Problem: Education: Goal: Knowledge of General Education information will improve Description: Including pain rating scale, medication(s)/side effects and non-pharmacologic comfort measures Outcome: Progressing   Problem: Health Behavior/Discharge Planning: Goal: Ability to manage health-related needs will improve Outcome: Progressing   Problem: Clinical Measurements: Goal: Respiratory complications will improve Outcome: Progressing Goal: Cardiovascular complication will be avoided Outcome: Progressing   Problem: Activity: Goal: Risk for activity intolerance will decrease Outcome: Progressing   Problem: Nutrition: Goal: Adequate nutrition will be maintained Outcome: Progressing   Problem: Elimination: Goal: Will not experience complications related to bowel motility Outcome: Progressing Goal: Will not experience complications related to urinary retention Outcome: Progressing   Problem: Pain Managment: Goal: General experience of comfort will improve and/or be controlled Outcome: Progressing

## 2024-05-23 NOTE — Progress Notes (Signed)
 PHARMACY - ANTICOAGULATION CONSULT NOTE  Pharmacy Consult for heparin Indication: CAD awaiting possible PCI  Allergies  Allergen Reactions   Codeine Other (See Comments)    zoned out   Levofloxacin Other (See Comments)    AMS   Mirabegron  Other (See Comments)    Severe depression   Prednisone Other (See Comments)    AMS   Sulfa Antibiotics Other (See Comments)    AMS   Duloxetine  Other (See Comments)    Caused patient more anxiety     Patient Measurements: Height: 5' 3 (160 cm) Weight: 82.9 kg (182 lb 12.2 oz) IBW/kg (Calculated) : 52.4 HEPARIN DW (KG): 70.7  Vital Signs: Temp: 98.5 F (36.9 C) (10/16 0251) Temp Source: Oral (10/16 0251) BP: 113/72 (10/16 0251) Pulse Rate: 73 (10/16 0251)  Labs: Recent Labs    05/20/24 2047 05/21/24 0019 05/21/24 0200 05/21/24 0646 05/21/24 1511 05/21/24 1519 05/22/24 0442 05/23/24 0256  HGB 12.8  --   --   --    < > 12.9 12.6 12.1  HCT 37.7  --   --   --    < > 38.0 37.4 36.7  PLT 259  --   --   --   --   --  254 277  APTT  --   --  31  --   --   --   --   --   LABPROT  --   --  13.3  --   --   --   --   --   INR  --   --  1.0  --   --   --   --   --   HEPARINUNFRC  --   --   --   --   --   --  0.31 0.50  CREATININE 0.93  --   --   --   --   --  1.02* 1.00  TROPONINIHS 21* 467*  --  562*  --   --   --   --    < > = values in this interval not displayed.    Estimated Creatinine Clearance: 37.4 mL/min (by C-G formula based on SCr of 1 mg/dL).   Medical History: Past Medical History:  Diagnosis Date   Acute CVA (cerebrovascular accident) (HCC) 06/28/2020   Anxiety    Aortic aneurysm without rupture    Aortic stenosis    mild-moderate by echo   Arthritis    CAD (coronary artery disease)    Chronic hip pain    Endometriosis    Hearing loss    HTN, goal below 150/90    Hyperlipidemia LDL goal <70 11/18/2014   NSTEMI (non-ST elevated myocardial infarction) (HCC) 05/21/2024   Osteopenia of the elderly    Urge  and stress incontinence     Assessment: 89yo female tx'd from [facility] after cardiac cath revealed severe 2v CAD with heavy calcification, pharmacy dosing IV heparin, staged PCI planned.  Heparin level therapeutic at 0.5, CBC stable.  Goal of Therapy:  Heparin level 0.3-0.7 units/ml Monitor platelets by anticoagulation protocol: Yes   Plan:  Heparin 1000 units/h Daily heparin level and CBC  Ozell Jamaica, PharmD, [facility] Clinical Pharmacist [phone] Please check AMION for all [facility] numbers 05/23/2024

## 2024-05-23 NOTE — Telephone Encounter (Signed)
 Will defer refilling this prescription, in case medication regimen is adjusted based on findings of ongoing hospitalization.  Lonni Hanson, MD [facility]

## 2024-05-24 ENCOUNTER — Other Ambulatory Visit: Payer: Self-pay

## 2024-05-24 DIAGNOSIS — I214 Non-ST elevation (NSTEMI) myocardial infarction: Secondary | ICD-10-CM | POA: Diagnosis not present

## 2024-05-24 DIAGNOSIS — I350 Nonrheumatic aortic (valve) stenosis: Secondary | ICD-10-CM | POA: Diagnosis not present

## 2024-05-24 LAB — BASIC METABOLIC PANEL WITH GFR
Anion gap: 11 (ref 5–15)
BUN: 40 mg/dL — ABNORMAL HIGH (ref 8–23)
CO2: 20 mmol/L — ABNORMAL LOW (ref 22–32)
Calcium: 8.7 mg/dL — ABNORMAL LOW (ref 8.9–10.3)
Chloride: 105 mmol/L (ref 98–111)
Creatinine, Ser: 1.14 mg/dL — ABNORMAL HIGH (ref 0.44–1.00)
GFR, Estimated: 45 mL/min — ABNORMAL LOW (ref >60)
Glucose, Bld: 111 mg/dL — ABNORMAL HIGH (ref 70–99)
Potassium: 3.7 mmol/L (ref 3.5–5.1)
Sodium: 136 mmol/L (ref 135–145)

## 2024-05-24 LAB — LIPID PANEL
Cholesterol: 140 mg/dL (ref 0–200)
HDL: 35 mg/dL — ABNORMAL LOW (ref >40)
LDL Cholesterol: 64 mg/dL (ref 0–99)
Total CHOL/HDL Ratio: 4 ratio
Triglycerides: 204 mg/dL — ABNORMAL HIGH (ref <150)
VLDL: 41 mg/dL — ABNORMAL HIGH (ref 0–40)

## 2024-05-24 LAB — CBC
HCT: 32 % — ABNORMAL LOW (ref 36.0–46.0)
Hemoglobin: 10.6 g/dL — ABNORMAL LOW (ref 12.0–15.0)
MCH: 32.1 pg (ref 26.0–34.0)
MCHC: 33.1 g/dL (ref 30.0–36.0)
MCV: 97 fL (ref 80.0–100.0)
Platelets: 254 K/uL (ref 150–400)
RBC: 3.3 MIL/uL — ABNORMAL LOW (ref 3.87–5.11)
RDW: 13 % (ref 11.5–15.5)
WBC: 9.5 K/uL (ref 4.0–10.5)
nRBC: 0 % (ref 0.0–0.2)

## 2024-05-24 MED ORDER — ALUM & MAG HYDROXIDE-SIMETH 200-200-20 MG/5ML PO SUSP
15.0000 mL | Freq: Four times a day (QID) | ORAL | Status: DC | PRN
  Administered 2024-05-24: 15 mL via ORAL
  Filled 2024-05-24: qty 30

## 2024-05-24 MED ORDER — RANOLAZINE ER 500 MG PO TB12
500.0000 mg | ORAL_TABLET | Freq: Two times a day (BID) | ORAL | Status: DC
Start: 2024-05-24 — End: 2024-05-26
  Administered 2024-05-24 – 2024-05-26 (×5): 500 mg via ORAL
  Filled 2024-05-24 (×5): qty 1

## 2024-05-24 MED ORDER — METOPROLOL SUCCINATE ER 50 MG PO TB24
50.0000 mg | ORAL_TABLET | Freq: Every evening | ORAL | Status: DC
  Administered 2024-05-25: 50 mg via ORAL
  Filled 2024-05-24 (×3): qty 1

## 2024-05-24 MED ORDER — NITROGLYCERIN IN D5W 200-5 MCG/ML-% IV SOLN
2.0000 ug/min | INTRAVENOUS | Status: DC
  Administered 2024-05-24: 5 ug/min via INTRAVENOUS
  Filled 2024-05-24: qty 250

## 2024-05-24 MED ORDER — EZETIMIBE 10 MG PO TABS
10.0000 mg | ORAL_TABLET | Freq: Every day | ORAL | Status: DC
  Administered 2024-05-24 – 2024-05-26 (×3): 10 mg via ORAL
  Filled 2024-05-24 (×3): qty 1

## 2024-05-24 MED ORDER — ISOSORBIDE MONONITRATE ER 60 MG PO TB24
60.0000 mg | ORAL_TABLET | Freq: Every day | ORAL | Status: DC
  Administered 2024-05-24 – 2024-05-26 (×3): 60 mg via ORAL
  Filled 2024-05-24 (×3): qty 1

## 2024-05-24 NOTE — Progress Notes (Signed)
 Pt had another episode of CP 5/10, described again as burning sensation. Notified Gail, MD. See new orders in [facility]. Pt is now resting comfortably with no complaints of pain. Pt instructed to inform RN of any changes or return of symptoms.   Lonell LITTIE Lyme, RN

## 2024-05-24 NOTE — Progress Notes (Signed)
 Pt had another episode of chest discomfort that she described as burning, CP 5/10. SL Nitroglycerin  x1 administered with alleviation of symptoms, pt also placed on 2L [place] of supplemental O2. Pt instructed to notify RN if symptoms reoccurred. Call bell within reach.   Lonell LITTIE Lyme, RN

## 2024-05-24 NOTE — Evaluation (Signed)
 Occupational Therapy Evaluation Patient Details Name: Angel [name] MRN: [id number] DOB: 03/31/1931 Today's Date: 05/24/2024   History of Present Illness   Pt is a 89 y/o female presenting on 10/14 to [facility] with chest pain. Found with NSTEMI, and underwent cardiac cath on 10/14. Transferred to [facility] 10/15 for PCI but unsuccessful. Per cardiology plan to focus on medical management. PMH includes: CAD, HTN, moderate AS, CVA, anxiety, arthritis.     Clinical Impressions PTA patient reports independent with ADLs, light IADLs and mobility using cane vs RW.  Admitted for above and presents with problem list below.  Currently pt completing transfers and mobility in room using RW with supervision, ADLs with up to supervision for safety.  HR max 95, SpO2 stable on RA, denies pain or discomfort during session.  Discussed energy conservation and safety, pt voices understanding.  She has good support at home, and can call for assist if needs from family/staff on site. Based on performance today, will follow acutely to optimize independence, safety and tolerance for daily routines but anticipate no further needs required after dc home.      If plan is discharge home, recommend the following:   A little help with bathing/dressing/bathroom;Assistance with cooking/housework;Assist for transportation;Help with stairs or ramp for entrance     Functional Status Assessment   Patient has had a recent decline in their functional status and demonstrates the ability to make significant improvements in function in a reasonable and predictable amount of time.     Equipment Recommendations   Tub/shower seat (but patient declining)     Recommendations for Other Services         Precautions/Restrictions   Precautions Precautions: Fall Recall of Precautions/Restrictions: Intact Restrictions Weight Bearing Restrictions Per Provider Order: No     Mobility Bed Mobility               General  bed mobility comments: OOB in recliner upon entry and at exit    Transfers Overall transfer level: Needs assistance Equipment used: Rolling walker (2 wheels) Transfers: Sit to/from Stand Sit to Stand: Supervision                  Balance Overall balance assessment: Mild deficits observed, not formally tested                                         ADL either performed or assessed with clinical judgement   ADL Overall ADL's : Needs assistance/impaired     Grooming: Supervision/safety;Standing           Upper Body Dressing : Supervision/safety;Sitting   Lower Body Dressing: Supervision/safety;Sit to/from stand   Toilet Transfer: Supervision/safety;Ambulation;Rolling walker (2 wheels)           Functional mobility during ADLs: Supervision/safety;Rolling walker (2 wheels)       Vision   Vision Assessment?: No apparent visual deficits     Perception         Praxis         Pertinent Vitals/Pain Pain Assessment Pain Assessment: No/denies pain     Extremity/Trunk Assessment Upper Extremity Assessment Upper Extremity Assessment: Generalized weakness   Lower Extremity Assessment Lower Extremity Assessment: Defer to PT evaluation       Communication Communication Communication: No apparent difficulties   Cognition Arousal: Alert Behavior During Therapy: WFL for tasks assessed/performed Cognition: No apparent impairments  Following commands: Intact       Cueing  General Comments   Cueing Techniques: Verbal cues  son and significant other present, supportive.  HR max 95. no chest pain during session.   Exercises     Shoulder Instructions      Home Living Family/patient expects to be discharged to:: Private residence Living Arrangements: Spouse/significant other;Other (Comment) Available Help at Discharge: Family (7pm-7am help on 3 days a week (2  of these days she has someone  in the house about 6 hours), significant other there 4 days week. and she has staff around daily who can assist as needed (staff from wedding venue on property)) Type of Home: House Home Access: Level entry     Home Layout: Able to live on main level with bedroom/bathroom;Multi-level (bed/bath on the main floor, chair lift down to main floor and pt does not go up to 3rd floor)     Bathroom Shower/Tub: Producer, television/film/video: Standard (BSC over toilet)     Home Equipment: Grab bars - tub/shower;Grab bars - toilet          Prior Functioning/Environment Prior Level of Function : Independent/Modified Independent             Mobility Comments: using RW or cane for mobility ADLs Comments: completes IADLs, drives minimally locally    OT Problem List: Decreased strength;Decreased activity tolerance;Impaired balance (sitting and/or standing);Decreased knowledge of precautions;Decreased knowledge of use of DME or AE   OT Treatment/Interventions: Self-care/ADL training;Energy conservation;DME and/or AE instruction;Therapeutic activities;Patient/family education;Balance training      OT Goals(Current goals can be found in the care plan section)   Acute Rehab OT Goals Patient Stated Goal: home OT Goal Formulation: With patient Time For Goal Achievement: 06/07/24 Potential to Achieve Goals: Good   OT Frequency:  Min 2X/week    Co-evaluation              AM-PAC OT 6 Clicks Daily Activity     Outcome Measure Help from another person eating meals?: None Help from another person taking care of personal grooming?: A Little Help from another person toileting, which includes using toliet, bedpan, or urinal?: A Little Help from another person bathing (including washing, rinsing, drying)?: A Little Help from another person to put on and taking off regular upper body clothing?: A Little Help from another person to put on and taking off regular lower body clothing?:  A Little 6 Click Score: 19   End of Session Equipment Utilized During Treatment: Rolling walker (2 wheels) Nurse Communication: Mobility status  Activity Tolerance: Patient tolerated treatment well Patient left: in chair;with call bell/phone within reach;with family/visitor present  OT Visit Diagnosis: Other abnormalities of gait and mobility (R26.89);Muscle weakness (generalized) (M62.81)                Time: 8874-8851 OT Time Calculation (min): 23 min Charges:  OT General Charges $OT Visit: 1 Visit OT Evaluation $OT Eval Moderate Complexity: 1 Mod OT Treatments $Self Care/Home Management : 8-22 mins  Etta NOVAK, OT Acute Rehabilitation Services Office [phone] Secure Chat Preferred    Etta GORMAN Hope 05/24/2024, 12:06 PM

## 2024-05-24 NOTE — Progress Notes (Signed)
 CARDIAC REHAB PHASE I   PRE:  Rate/Rhythm: 90 SR    BP: lying before meds 89/61    SpO2: 94 RA  MODE:  Ambulation: 340 ft   POST:  Rate/Rhythm: 112 ST    BP: sitting 107/84     SpO2: 97 RA   Pt slow to get up due to arthritis but able to walk with RW and standby assist. Long distance and denied any CP or other sx. To recliner.   Discussed with pt and her significant other MI, restrictions, angina/NTG, exercise as tolerated and CRPII. Pt receptive with many appropriate questions. Will refer to [facility] CRPII.  [phone]  Aliene Aris BS, ACSM-CEP 05/24/2024 10:53 AM

## 2024-05-24 NOTE — Progress Notes (Signed)
 Dr. [place] felt best option was for patient to stay for obs today given age, decline in Hgb, bruising at cath site, recurrent CP overnight requiring IV NTG. Fortunately remaining stable this afternoon. Originally patient insistent on DC but after discussion with family, patient now agreeable to stay. Will order f/u labs for AM.

## 2024-05-24 NOTE — Evaluation (Signed)
 Physical Therapy Evaluation Patient Details Name: Angel [name] MRN: [id number] DOB: 05-31-1931 Today's Date: 05/24/2024  History of Present Illness  Pt is a 89 y/o female presenting on 10/14 to [facility] with chest pain. Found with NSTEMI, and underwent cardiac cath on 10/14. Transferred to [facility] 10/15 for PCI but unsuccessful. Per cardiology plan to focus on medical management. PMH includes: CAD, HTN, moderate AS, CVA, anxiety, arthritis.  Clinical Impression  Pt is presenting at Mod I for sit to stand, gait with RW. Pt was able to ambulate 100 ft without significant deviations from baseline. Pt with 1x verbal cues for body proximity to RW. Significant other and son present and supportive. Currently pt is presenting at baseline level of functioning and no skilled physical therapy services recommended. Pt will be discharged from skilled physical therapy services at this time; please re-consult if further needs arise.            If plan is discharge home, recommend the following: Assistance with cooking/housework;Assist for transportation     Equipment Recommendations None recommended by PT     Functional Status Assessment Patient has had a recent decline in their functional status and demonstrates the ability to make significant improvements in function in a reasonable and predictable amount of time.     Precautions / Restrictions Precautions Precautions: Fall Recall of Precautions/Restrictions: Intact Restrictions Weight Bearing Restrictions Per Provider Order: No      Mobility  Bed Mobility     General bed mobility comments: OOB in recliner upon entry and at exit    Transfers Overall transfer level: Modified independent Equipment used: Rolling walker (2 wheels) Transfers: Sit to/from Stand Sit to Stand: Modified independent (Device/Increase time)           General transfer comment: good hand placement    Ambulation/Gait Ambulation/Gait assistance: Modified independent  (Device/Increase time) Gait Distance (Feet): 100 Feet Assistive device: Rolling walker (2 wheels) Gait Pattern/deviations: Step-through pattern, Decreased stride length Gait velocity: decreased Gait velocity interpretation: 1.31 - 2.62 ft/sec, indicative of limited community ambulator   General Gait Details: no significant deviations. Pt kyphotic, 1x verbal cues for body proximity to RW. Significant other states pt is moving similar to prior to hospitalization  Stairs Stairs:  (level entry and stair lift.)             Balance Overall balance assessment: Mild deficits observed, not formally tested       Pertinent Vitals/Pain Pain Assessment Pain Assessment: No/denies pain    Home Living Family/patient expects to be discharged to:: Private residence Living Arrangements: Spouse/significant other;Other (Comment) Available Help at Discharge: Family (7pm-7am help on 3 days a week (2  of these days she has someone in the house about 6 hours), significant other there 4 days week. and she has staff around daily who can assist as needed (staff from wedding venue on property)) Type of Home: House Home Access: Level entry       Home Layout: Able to live on main level with bedroom/bathroom;Multi-level (bed/bath on the main floor, chair lift down to main floor and pt does not go up to 3rd floor) Home Equipment: Grab bars - tub/shower;Grab bars - toilet      Prior Function Prior Level of Function : Independent/Modified Independent             Mobility Comments: using RW or cane for mobility ADLs Comments: completes IADLs, drives minimally locally     Extremity/Trunk Assessment   Upper Extremity Assessment Upper Extremity  Assessment: Defer to OT evaluation    Lower Extremity Assessment Lower Extremity Assessment: Generalized weakness;Overall [facility] for tasks assessed    Cervical / Trunk Assessment Cervical / Trunk Assessment: Normal  Communication    Communication Communication: No apparent difficulties    Cognition Arousal: Alert Behavior During Therapy: WFL for tasks assessed/performed   Following commands: Intact       Cueing Cueing Techniques: Verbal cues     General Comments General comments (skin integrity, edema, etc.): Pt O2 sats 91% after ambulation and HR WNL throughout ~80-90 bpm        Assessment/Plan    PT Assessment Patient does not need any further PT services         PT Goals (Current goals can be found in the Care Plan section)  Acute Rehab PT Goals PT Goal Formulation: All assessment and education complete, DC therapy     AM-PAC PT 6 Clicks Mobility  Outcome Measure Help needed turning from your back to your side while in a flat bed without using bedrails?: None Help needed moving from lying on your back to sitting on the side of a flat bed without using bedrails?: None Help needed moving to and from a bed to a chair (including a wheelchair)?: None Help needed standing up from a chair using your arms (e.g., wheelchair or bedside chair)?: None Help needed to walk in hospital room?: None Help needed climbing 3-5 steps with a railing? : A Little 6 Click Score: 23    End of Session Equipment Utilized During Treatment: Gait belt Activity Tolerance: Patient tolerated treatment well Patient left: in chair;with call bell/phone within reach;with family/visitor present Nurse Communication: Mobility status      Time: 1206-1219 PT Time Calculation (min) (ACUTE ONLY): 13 min   Charges:   PT Evaluation $PT Eval Low Complexity: 1 Low   PT General Charges $$ ACUTE PT VISIT: 1 Visit         Dorothyann Maier, DPT, CLT  Acute Rehabilitation Services Office: [phone] (Secure chat preferred)   Dorothyann VEAR Maier 05/24/2024, 12:25 PM

## 2024-05-24 NOTE — Progress Notes (Signed)
 Progress Note  Patient Name: Angel [name] Date of Encounter: 05/24/2024 [facility] Cardiologist: Lonni Hanson, MD   Interval Summary    Patient had recurrent chest pain last night relieved with IV Ntg. Also significant bruising at groin cath site. Reports she is pain free this am.   Vital Signs Vitals:   05/24/24 0304 05/24/24 0400 05/24/24 0417 05/24/24 0700  BP: (!) 94/51 132/74 129/76 115/64  Pulse: 93 83 85   Resp: 20 20 18 18   Temp: 97.9 F (36.6 C)   97.8 F (36.6 C)  TempSrc: Oral   Oral  SpO2: 92% 96% 97% 97%  Weight:      Height:        Intake/Output Summary (Last 24 hours) at 05/24/2024 0824 Last data filed at 05/23/2024 2200 Gross per 24 hour  Intake 1000 ml  Output --  Net 1000 ml      05/23/2024    7:00 AM 05/22/2024    3:07 AM 05/21/2024    7:29 PM  Last 3 Weights  Weight (lbs) 185 lb 10 oz 182 lb 12.2 oz 184 lb 11.9 oz  Weight (kg) 84.2 kg 82.9 kg 83.8 kg      Telemetry/ECG  Sinus Rhythm rate 85- Personally Reviewed  Cardiac studies  Diagnostic Dominance: Right   Physical Exam  GEN: No acute distress.   Neck: No JVD Cardiac: RRR, + 2/6 systolic murmur RUSB, no rubs, or gallops.  Respiratory: Clear to auscultation bilaterally. GI: Soft, nontender, non-distended  MS: No edema, left radial cath site is bruised no hematoma Right  groin with extensive bruising. Soft, no bruit. Distal pulse ok  Assessment & Plan   89 y.o. female with moderate to severe AS presents with NSTEMI. Underwent cardiac cath at [facility] with findings of CTO of small LCx and critical ostial RCA stenosis which is culprit. Transferred for consideration of complex PCI   NSTEMI -- High-sensitivity troponin 21>> 467>> 562 -- Underwent cardiac catheterization on 1014 at [facility] with severe two-vessel CAD with CTO of mid circumflex and 99% ostial RCA disease with heavy calcification.  - attempted PCI yesterday unsuccessful since unable to engage the RCA.  Critical ostial lesion and difficult take off. Will need to focus on medical management.  -- on DAPT with ASAS and Plavix  for ACS indication -- will increase Toprol  XL to 100 mg in am and 50 mg in pm. Will benefit from slower HR -- increase Imdur to 60 mg daily.  -- add Ranexa 500 mg bid -- will hold lisinopril  to allow more BP for antianginal titration.  -- with recurrent chest pain have strongly recommended we continue hospitalization for adjustment of medication. She insists on going home. Will DC IV Ntg. Ambulate with Cardiac Rehab.  -- in same breath patient wants case management assessment for DC needs - will consult PT/OT and case management  Aortic stenosis -- Echo 10/14 with mild to moderate aortic valve stenosis, mean gradient of 16.8 mmHg. -- Cath data with moderate to severe aortic valve stenosis with a mean gradient of 22 mmHg and aortic valve area of 0.9 cm -- not really a TAVR candidate with her unstable coronary situation  HTN -- Well-controlled -- see changes above  HLD -- LP(a) 255, LDL 64 -- Continue Lipitor 80 mg daily -- add Zetia 10 mg daily  Code status. Limited Code status documented- no CPR or intubation  For questions or updates, please contact [facility] Please consult [email or web address] for contact info under  Signed, Finneas Mathe [place], MD

## 2024-05-24 NOTE — Plan of Care (Signed)
  Problem: Education: Goal: Knowledge of General Education information will improve Description: Including pain rating scale, medication(s)/side effects and non-pharmacologic comfort measures Outcome: Progressing   Problem: Clinical Measurements: Goal: Will remain free from infection Outcome: Progressing Goal: Respiratory complications will improve Outcome: Progressing   Problem: Nutrition: Goal: Adequate nutrition will be maintained Outcome: Progressing   Problem: Elimination: Goal: Will not experience complications related to bowel motility Outcome: Progressing Goal: Will not experience complications related to urinary retention Outcome: Progressing   Problem: Pain Managment: Goal: General experience of comfort will improve and/or be controlled Outcome: Progressing

## 2024-05-24 NOTE — Progress Notes (Signed)
 Pt complaining of 5/10 CP, 12-lead EKG obtained, SL Nitroglycerin  x1 administered with no relief. Attempted to notify Gail, MD. PRN Maalox administered with alleviation of symptoms.   Lonell LITTIE Lyme, RN

## 2024-05-25 DIAGNOSIS — E785 Hyperlipidemia, unspecified: Secondary | ICD-10-CM

## 2024-05-25 DIAGNOSIS — D649 Anemia, unspecified: Secondary | ICD-10-CM | POA: Diagnosis not present

## 2024-05-25 DIAGNOSIS — I214 Non-ST elevation (NSTEMI) myocardial infarction: Secondary | ICD-10-CM | POA: Diagnosis not present

## 2024-05-25 LAB — COMPREHENSIVE METABOLIC PANEL WITH GFR
ALT: 26 U/L (ref 0–44)
AST: 74 U/L — ABNORMAL HIGH (ref 15–41)
Albumin: 2.9 g/dL — ABNORMAL LOW (ref 3.5–5.0)
Alkaline Phosphatase: 42 U/L (ref 38–126)
Anion gap: 12 (ref 5–15)
BUN: 51 mg/dL — ABNORMAL HIGH (ref 8–23)
CO2: 21 mmol/L — ABNORMAL LOW (ref 22–32)
Calcium: 8.3 mg/dL — ABNORMAL LOW (ref 8.9–10.3)
Chloride: 101 mmol/L (ref 98–111)
Creatinine, Ser: 1.77 mg/dL — ABNORMAL HIGH (ref 0.44–1.00)
GFR, Estimated: 27 mL/min — ABNORMAL LOW (ref >60)
Glucose, Bld: 109 mg/dL — ABNORMAL HIGH (ref 70–99)
Potassium: 3.7 mmol/L (ref 3.5–5.1)
Sodium: 134 mmol/L — ABNORMAL LOW (ref 135–145)
Total Bilirubin: 0.8 mg/dL (ref 0.0–1.2)
Total Protein: 5.8 g/dL — ABNORMAL LOW (ref 6.5–8.1)

## 2024-05-25 LAB — CBC
HCT: 27.9 % — ABNORMAL LOW (ref 36.0–46.0)
Hemoglobin: 9.4 g/dL — ABNORMAL LOW (ref 12.0–15.0)
MCH: 32.4 pg (ref 26.0–34.0)
MCHC: 33.7 g/dL (ref 30.0–36.0)
MCV: 96.2 fL (ref 80.0–100.0)
Platelets: 199 K/uL (ref 150–400)
RBC: 2.9 MIL/uL — ABNORMAL LOW (ref 3.87–5.11)
RDW: 13.1 % (ref 11.5–15.5)
WBC: 9.2 K/uL (ref 4.0–10.5)
nRBC: 0 % (ref 0.0–0.2)

## 2024-05-25 LAB — HEMOGLOBIN AND HEMATOCRIT, BLOOD
HCT: 30.2 % — ABNORMAL LOW (ref 36.0–46.0)
Hemoglobin: 9.9 g/dL — ABNORMAL LOW (ref 12.0–15.0)

## 2024-05-25 MED ORDER — SODIUM CHLORIDE 0.9 % IV SOLN: INTRAVENOUS | Status: AC

## 2024-05-25 MED ORDER — DOCUSATE SODIUM 100 MG PO CAPS
100.0000 mg | ORAL_CAPSULE | Freq: Every day | ORAL | Status: DC
  Administered 2024-05-25 – 2024-05-26 (×2): 100 mg via ORAL
  Filled 2024-05-25 (×2): qty 1

## 2024-05-25 NOTE — Progress Notes (Addendum)
 Rounding Note   Patient Name: Angel [name] Date of Encounter: 05/25/2024  [facility] Cardiologist: Lonni Hanson, MD   Subjective Chest pain has resolved.   Scheduled Meds:  aspirin  EC  81 mg Oral Daily   atorvastatin   80 mg Oral Daily   clopidogrel   75 mg Oral Daily   vitamin B-12  1,000 mcg Oral Daily   ezetimibe  10 mg Oral Daily   isosorbide mononitrate  60 mg Oral Daily   metoprolol  succinate  100 mg Oral Daily   metoprolol  succinate  50 mg Oral QPM   ranolazine  500 mg Oral BID   sodium chloride  flush  3 mL Intravenous Q12H   Continuous Infusions:  PRN Meds: acetaminophen , alum & mag hydroxide-simeth, fentaNYL (SUBLIMAZE) injection, nitroGLYCERIN , ondansetron (ZOFRAN) IV, sodium chloride  flush   Vital Signs  Vitals:   05/24/24 1710 05/24/24 1910 05/25/24 0006 05/25/24 0345  BP: (!) 93/59 (!) 95/56 (!) 110/57 116/70  Pulse: 83 79 81 80  Resp: 18 17 (!) 21 19  Temp: 97.8 F (36.6 C) 98.2 F (36.8 C) 97.9 F (36.6 C) 98.1 F (36.7 C)  TempSrc: Oral Oral Oral Oral  SpO2: 92% 91% 91% 99%  Weight:      Height:        Intake/Output Summary (Last 24 hours) at 05/25/2024 0720 Last data filed at 05/24/2024 1654 Gross per 24 hour  Intake 8.2 ml  Output --  Net 8.2 ml      05/23/2024    7:00 AM 05/22/2024    3:07 AM 05/21/2024    7:29 PM  Last 3 Weights  Weight (lbs) 185 lb 10 oz 182 lb 12.2 oz 184 lb 11.9 oz  Weight (kg) 84.2 kg 82.9 kg 83.8 kg      Telemetry NSR - Personally Reviewed  ECG  N/a - Personally Reviewed  Physical Exam  GEN: No acute distress.   Neck: No JVD Cardiac: RRR, no murmurs, rubs, or gallops.  Respiratory: Clear to auscultation bilaterally. GI: Soft, nontender, non-distended  MS: No edema; No deformity. Neuro:  Nonfocal  Psych: Normal affect   Labs High Sensitivity Troponin:   Recent Labs  Lab 05/20/24 2047 05/21/24 0019 05/21/24 0646  TROPONINIHS 21* 467* 562*     Chemistry Recent Labs  Lab  05/23/24 0256 05/24/24 0313 05/25/24 0214  NA 139 136 134*  K 3.8 3.7 3.7  CL 106 105 101  CO2 22 20* 21*  GLUCOSE 116* 111* 109*  BUN 36* 40* 51*  CREATININE 1.00 1.14* 1.77*  CALCIUM  8.8* 8.7* 8.3*  PROT  --   --  5.8*  ALBUMIN  --   --  2.9*  AST  --   --  74*  ALT  --   --  26  ALKPHOS  --   --  42  BILITOT  --   --  0.8  GFRNONAA 53* 45* 27*  ANIONGAP 11 11 12     Lipids  Recent Labs  Lab 05/24/24 0313  CHOL 140  TRIG 204*  HDL 35*  LDLCALC 64  CHOLHDL 4.0    Hematology Recent Labs  Lab 05/23/24 0256 05/24/24 0313 05/25/24 0214  WBC 8.5 9.5 9.2  RBC 3.76* 3.30* 2.90*  HGB 12.1 10.6* 9.4*  HCT 36.7 32.0* 27.9*  MCV 97.6 97.0 96.2  MCH 32.2 32.1 32.4  MCHC 33.0 33.1 33.7  RDW 13.1 13.0 13.1  PLT 277 254 199   Thyroid  No results for input(s): TSH, FREET4  in the last 168 hours.  BNPNo results for input(s): BNP, PROBNP in the last 168 hours.  DDimer No results for input(s): DDIMER in the last 168 hours.   Radiology  CARDIAC CATHETERIZATION Result Date: 05/23/2024   Prox RCA lesion is 70% stenosed.   Ost RCA to Prox RCA lesion is 99% stenosed.   Mid RCA lesion is 70% stenosed. Severe, heavily calcified disease extending from the ostium of the RCA with moderate to severe, calcific disease in the mid vessel. Unsuccessful attempt at PCI of the RCA Recommendations: Medical management of CAD      Patient Profile   89 y.o. female with moderate to severe AS presents with NSTEMI. Underwent cardiac cath at [facility] with findings of CTO of small LCx and critical ostial RCA stenosis which is culprit. Transferred for consideration of complex PCI   Assessment & Plan   1.CAD/NSTEMI - trop up to 562 - Underwent cardiac catheterization on 05/21/24 at [facility] with severe two-vessel CAD with CTO of mid circumflex and 99% ostial RCA disease with heavy calcification.  - attempted PCI 05/23/24 unsuccessful since unable to engage the RCA. Critical ostial lesion and  difficult take off. Will need to focus on medical management.  - 05/2024 echo: LVEF 55%  - medical therapy with ASA 81, plavix  75, atorva 80, zetia 10mg , imdur 53m, toprol  100mg  in AM and 50mg  in pm, ranexa 500mg  bid - some recurrent chest pain yesterday, started on IV NG and antianginals titrated up. - chest pain has resolved, off IV NG.   2. Aortic stenosis - - Echo 10/14 with mild to moderate aortic valve stenosis, mean gradient of 16.8 mmHg. -- Cath data with moderate to severe aortic valve stenosis with a mean gradient of 22 mmHg and aortic valve area of 0.9 cm  3. Anemia - decline in Hgb since 10/16 cath. 12.1-->10.6-->9.4. Had heavy bruising at cath site. No significant palpable hematoma on exam - repeat H&H at noon today  4.AKI - Cr up to 1.77 today, likely contrast related.  - give gentle IVFs.   Possible d/c today pending H&H at noon  For questions or updates, please contact [facility] Please consult [email or web address] for contact info under       Signed, Alvan Carrier, MD  05/25/2024, 7:20 AM

## 2024-05-25 NOTE — Plan of Care (Signed)
  Problem: Education: Goal: Knowledge of General Education information will improve Description: Including pain rating scale, medication(s)/side effects and non-pharmacologic comfort measures Outcome: Progressing   Problem: Health Behavior/Discharge Planning: Goal: Ability to manage health-related needs will improve Outcome: Progressing   Problem: Pain Managment: Goal: General experience of comfort will improve and/or be controlled Outcome: Progressing   Problem: Safety: Goal: Ability to remain free from injury will improve Outcome: Progressing   Problem: Cardiac: Goal: Ability to achieve and maintain adequate cardiovascular perfusion will improve Outcome: Progressing   Problem: Health Behavior/Discharge Planning: Goal: Ability to safely manage health-related needs after discharge will improve Outcome: Progressing   Problem: Cardiovascular: Goal: Ability to achieve and maintain adequate cardiovascular perfusion will improve Outcome: Progressing

## 2024-05-26 ENCOUNTER — Encounter: Payer: Self-pay | Admitting: Internal Medicine

## 2024-05-26 ENCOUNTER — Other Ambulatory Visit (HOSPITAL_COMMUNITY): Payer: Self-pay

## 2024-05-26 ENCOUNTER — Encounter (HOSPITAL_COMMUNITY): Payer: Self-pay | Admitting: Internal Medicine

## 2024-05-26 DIAGNOSIS — I214 Non-ST elevation (NSTEMI) myocardial infarction: Secondary | ICD-10-CM | POA: Diagnosis not present

## 2024-05-26 DIAGNOSIS — N179 Acute kidney failure, unspecified: Secondary | ICD-10-CM | POA: Insufficient documentation

## 2024-05-26 DIAGNOSIS — D649 Anemia, unspecified: Secondary | ICD-10-CM | POA: Insufficient documentation

## 2024-05-26 LAB — BASIC METABOLIC PANEL WITH GFR
Anion gap: 10 (ref 5–15)
BUN: 40 mg/dL — ABNORMAL HIGH (ref 8–23)
CO2: 20 mmol/L — ABNORMAL LOW (ref 22–32)
Calcium: 8.3 mg/dL — ABNORMAL LOW (ref 8.9–10.3)
Chloride: 106 mmol/L (ref 98–111)
Creatinine, Ser: 1.46 mg/dL — ABNORMAL HIGH (ref 0.44–1.00)
GFR, Estimated: 34 mL/min — ABNORMAL LOW (ref >60)
Glucose, Bld: 127 mg/dL — ABNORMAL HIGH (ref 70–99)
Potassium: 4.2 mmol/L (ref 3.5–5.1)
Sodium: 136 mmol/L (ref 135–145)

## 2024-05-26 LAB — CBC
HCT: 27.2 % — ABNORMAL LOW (ref 36.0–46.0)
Hemoglobin: 9.1 g/dL — ABNORMAL LOW (ref 12.0–15.0)
MCH: 32.6 pg (ref 26.0–34.0)
MCHC: 33.5 g/dL (ref 30.0–36.0)
MCV: 97.5 fL (ref 80.0–100.0)
Platelets: 202 K/uL (ref 150–400)
RBC: 2.79 MIL/uL — ABNORMAL LOW (ref 3.87–5.11)
RDW: 13.1 % (ref 11.5–15.5)
WBC: 9.8 K/uL (ref 4.0–10.5)
nRBC: 0 % (ref 0.0–0.2)

## 2024-05-26 MED ORDER — EZETIMIBE 10 MG PO TABS
10.0000 mg | ORAL_TABLET | Freq: Every day | ORAL | 1 refills | Status: DC
  Filled 2024-05-26: qty 90, 90d supply, fill #0

## 2024-05-26 MED ORDER — CLOPIDOGREL BISULFATE 75 MG PO TABS
75.0000 mg | ORAL_TABLET | Freq: Every day | ORAL | 2 refills | Status: DC
  Filled 2024-05-26: qty 90, 90d supply, fill #0

## 2024-05-26 MED ORDER — METOPROLOL SUCCINATE ER 50 MG PO TB24
50.0000 mg | ORAL_TABLET | Freq: Two times a day (BID) | ORAL | 2 refills | Status: DC
  Filled 2024-05-26: qty 60, 30d supply, fill #0

## 2024-05-26 MED ORDER — METOPROLOL SUCCINATE ER 50 MG PO TB24
50.0000 mg | ORAL_TABLET | Freq: Two times a day (BID) | ORAL | Status: DC
  Administered 2024-05-26: 50 mg via ORAL
  Filled 2024-05-26: qty 1

## 2024-05-26 MED ORDER — ISOSORBIDE MONONITRATE ER 60 MG PO TB24
60.0000 mg | ORAL_TABLET | Freq: Every day | ORAL | 1 refills | Status: DC
  Filled 2024-05-26: qty 90, 90d supply, fill #0

## 2024-05-26 MED ORDER — RANOLAZINE ER 500 MG PO TB12
500.0000 mg | ORAL_TABLET | Freq: Two times a day (BID) | ORAL | 2 refills | Status: DC
  Filled 2024-05-26: qty 60, 30d supply, fill #0

## 2024-05-26 NOTE — Discharge Summary (Signed)
 Discharge Summary   Patient ID: Angel [name] MRN: [id number]; DOB: [birth year]  Admit date: 05/22/2024 Discharge date: 05/26/2024  PCP:  Lendia Boby CROME, NP-C   [facility] Providers Cardiologist:  Lonni Hanson, MD     Discharge Diagnoses  Principal Problem:   NSTEMI (non-ST elevated myocardial infarction) [facility]) Active Problems:   Essential hypertension   Hyperlipidemia LDL goal <70   Mixed hyperlipidemia   AKI (acute kidney injury)   Anemia  Diagnostic Studies/Procedures   Cath: 05/21/2024  Conclusions: Severe two-vessel coronary artery disease with chronic total occlusion of mid LCx and 99% ostial RCA disease with heavy calcification.  Suspect there is also moderate to severe disease in the mid RCA, though this is suboptimally visualized due to incomplete filling of the RCA secondary to severe ostial disease.  LAD with mild-moderate disease. Mildly elevated left heart and pulmonary artery pressures (PCWP 18 mmHg, LVEDP 20 mmHg, mean PA 26 mmHg). Normal Fick cardiac output/index CO 5.4 L/min, CI 2.9 L/min/m). Moderate-severe aortic valve stenosis (mean gradient 22 mmHg, aortic valve area 0.9 cm).   Recommendations: Catheterization findings discussed at length with the patient and her family and also reviewed with the interventional team at [facility].  Will arrange for transfer to [facility] for possible PCI to RCA, which may require atherectomy and/or intravascular lithotripsy given heavy calcification.  Recommend consideration of femoral approach to optimize support for intervention. Resume heparin infusion 2 hours after TR band has been removed. Loaded with clopidogrel  with plans for dual antiplatelet therapy with aspirin  and clopidogrel  for 12 months. Follow-up echocardiogram to reassess LVEF and aortic stenosis. Aggressive secondary prevention of coronary artery disease.   Lonni Hanson, MD [facility]  Diagnostic Dominance: Right    Echo:  05/21/2024  IMPRESSIONS     1. Left ventricular ejection fraction, by estimation, is 55 %. Left  ventricular ejection fraction by PLAX is 59 %. The left ventricle has  normal function. The left ventricle demonstrates regional wall motion  abnormalities (mild inferolateral and lateral   wall hypokinesis). Left ventricular diastolic parameters are consistent  with Grade I diastolic dysfunction (impaired relaxation).   2. Right ventricular systolic function is normal. The right ventricular  size is normal.   3. The mitral valve is normal in structure. Mild mitral valve  regurgitation. No evidence of mitral stenosis.   4. The aortic valve is calcified. There is moderate calcification of the  aortic valve. Aortic valve regurgitation is not visualized. Mild to  moderate aortic valve stenosis. Aortic valve area, by VTI measures 0.70  cm. Aortic valve mean gradient measures   16.8 mmHg. Aortic valve Vmax measures 2.69 m/s.   5. The inferior vena cava is normal in size with greater than 50%  respiratory variability, suggesting right atrial pressure of 3 mmHg.   6. Challenging images, definity  not used   FINDINGS   Left Ventricle: Left ventricular ejection fraction, by estimation, is 55  %. Left ventricular ejection fraction by PLAX is 59 %. The left ventricle  has normal function. The left ventricle demonstrates regional wall motion  abnormalities. Strain was  performed and the global longitudinal strain is indeterminate. The left  ventricular internal cavity size was normal in size. There is no left  ventricular hypertrophy. Left ventricular diastolic parameters are  consistent with Grade I diastolic dysfunction   (impaired relaxation).   Right Ventricle: The right ventricular size is normal. No increase in  right ventricular wall thickness. Right ventricular systolic function is  normal.   Left Atrium: Left atrial size was normal in size.   Right Atrium: Right atrial size was  normal in size.   Pericardium: There is no evidence of pericardial effusion.   Mitral Valve: The mitral valve is normal in structure. Mild mitral annular  calcification. Mild mitral valve regurgitation. No evidence of mitral  valve stenosis.   Tricuspid Valve: The tricuspid valve is normal in structure. Tricuspid  valve regurgitation is not demonstrated. No evidence of tricuspid  stenosis.   Aortic Valve: The aortic valve is calcified. There is moderate  calcification of the aortic valve. Aortic valve regurgitation is not  visualized. Mild to moderate aortic stenosis is present. Aortic valve mean  gradient measures 16.8 mmHg. Aortic valve peak  gradient measures 28.9 mmHg. Aortic valve area, by VTI measures 0.70 cm.   Pulmonic Valve: The pulmonic valve was normal in structure. Pulmonic valve  regurgitation is not visualized. No evidence of pulmonic stenosis.   Aorta: The aortic root is normal in size and structure.   Venous: The inferior vena cava is normal in size with greater than 50%  respiratory variability, suggesting right atrial pressure of 3 mmHg.   IAS/Shunts: No atrial level shunt detected by color flow Doppler.   Additional Comments: 3D was performed not requiring image post processing  on an independent workstation and was indeterminate.   _____________   History of Present Illness   Angel [name] is a 89 y.o. female with  a history of coronary artery disease, hypertension, moderate AS, and prior stroke who was seen 05/22/2024 for the evaluation of chest pain.   Angel [name] presented with acute onset chest pain that radiated to the jaw sometime during the evening of 05/21/2024.  She described the symptoms as constant, pressure-like pain that was persistent.  Other associated symptoms included fatigue.  This prompted an ER visit.  She denied syncope, apnea, paroxysmal nocturnal dyspnea, fevers, chills, shortness of breath.   Patient stated her most recent symptoms  are fairly new.  She lives in a house with lots of land, and ambulates with a mechanical device.  She stated she has not noted chest pain with exertion, however her movements are fairly minimal.   She presented to [facility], she was found to have evidence of acute myocardial injury with a high-sensitivity troponin of 467-> 562.  Other notable labs include K 3.3, sCr 0.9. Underwent a left heart catheterization that showed severe two-vessel coronary artery disease specifically, she had evidence of a CTO of the mid circumflex, and 99% ostial RCA disease.  LVEDP was 20.  She was also found to have evidence of moderate aortic stenosis with a mean gradient of 22, and an aortic valve area 0.9, cardiac index 2.9.  Patient was continued on heparin and received aspirin  and Plavix  for antiplatelet therapy.  She was transferred to [facility] for further management of her obstructive coronary artery disease.   Hospital Course    NSTEMI -- High-sensitivity troponin 21>> 467>> 562 -- Underwent cardiac catheterization on 1014 at [facility] with severe two-vessel CAD with CTO of mid circumflex and 99% ostial RCA disease with heavy calcification.  -- attempted PCI  10/16 unsuccessful since unable to engage the RCA. Critical ostial lesion and difficult take off. Plan for medical management.  -- seen by CR -- on DAPT with ASA and Plavix  for ACS indication. Continue Toprol  XL 50mg  BID, Imdur to 60 mg daily and Ranexa 500 mg bid. Hold lisinopril  held with AKI at  discharge.   Aortic stenosis -- Echo 10/14 with mild to moderate aortic valve stenosis, mean gradient of 16.8 mmHg. -- Cath data with moderate to severe aortic valve stenosis with a mean gradient of 22 mmHg and aortic valve area of 0.9 cm -- not felt to be a TAVR candidate with her unstable coronary situation   HTN -- Well-controlled -- Toprol  XL 50mg  BID, Imdur to 60 mg daily   HLD -- LP(a) 255, LDL 64 -- Continue Lipitor 80 mg daily, Zetia 10 mg  daily  Anemia -- decline in Hgb since 10/16 cath. 12.1-->10.6-->9.4-->9.1. Had heavy bruising at cath site. No significant palpable hematoma was noted   AKI -- Cr peaked at 1.77, improved to 1.4 with IVFs prior to discharge  Patient was seen by Dr. Alvan and deemed stable for discharge home. Follow up arranged in the office     Did the patient have an acute coronary syndrome (MI, NSTEMI, STEMI, etc) this admission?:  Yes                               AHA/ACC ACS Clinical Performance & Quality Measures: Aspirin  prescribed? - Yes ADP Receptor Inhibitor (Plavix /Clopidogrel , Brilinta/Ticagrelor or Effient/Prasugrel) prescribed (includes medically managed patients)? - Yes Beta Blocker prescribed? - Yes High Intensity Statin (Lipitor 40-80mg  or Crestor 20-40mg ) prescribed? - Yes EF assessed during THIS hospitalization? - Yes For EF <40%, was ACEI/ARB prescribed? - Not Applicable (EF >/= 40%) For EF <40%, Aldosterone Antagonist (Spironolactone or Eplerenone) prescribed? - Not Applicable (EF >/= 40%) Cardiac Rehab Phase II ordered (including medically managed patients)? - Yes   The patient will be scheduled for a TOC follow up appointment in 10-14 days.  This has been already been arranged by [facility] team prior to discharge. _____________  Discharge Vitals Blood pressure 105/61, pulse 83, temperature 98.2 F (36.8 C), temperature source Oral, resp. rate 19, height 5' 3 (1.6 m), weight 84.2 kg, SpO2 91%.  Filed Weights   05/22/24 0307 05/23/24 0700  Weight: 82.9 kg 84.2 kg    Labs & Radiologic Studies  CBC Recent Labs    05/25/24 0214 05/25/24 1134 05/26/24 0225  WBC 9.2  --  9.8  HGB 9.4* 9.9* 9.1*  HCT 27.9* 30.2* 27.2*  MCV 96.2  --  97.5  PLT 199  --  202   Basic Metabolic Panel Recent Labs    89/81/74 0214 05/26/24 0225  NA 134* 136  K 3.7 4.2  CL 101 106  CO2 21* 20*  GLUCOSE 109* 127*  BUN 51* 40*  CREATININE 1.77* 1.46*  CALCIUM  8.3* 8.3*   Liver Function  Tests Recent Labs    05/25/24 0214  AST 74*  ALT 26  ALKPHOS 42  BILITOT 0.8  PROT 5.8*  ALBUMIN 2.9*   No results for input(s): LIPASE, AMYLASE in the last 72 hours. High Sensitivity Troponin:   Recent Labs  Lab 05/20/24 2047 05/21/24 0019 05/21/24 0646  TROPONINIHS 21* 467* 562*    No results for input(s): TRNPT in the last 720 hours.  BNP Invalid input(s): POCBNP No results for input(s): PROBNP in the last 72 hours.  No results for input(s): BNP in the last 72 hours.  D-Dimer No results for input(s): DDIMER in the last 72 hours. Hemoglobin A1C No results for input(s): HGBA1C in the last 72 hours. Fasting Lipid Panel Recent Labs    05/24/24 0313  CHOL 140  HDL 35*  LDLCALC  64  TRIG 204*  CHOLHDL 4.0   Lipoprotein (a)  Date/Time Value Ref Range Status  05/22/2024 04:42 AM 255.8 (H) <75.0 nmol/L Final    Comment:    (NOTE) **Results verified by repeat testing** This test was developed and its performance characteristics determined by [facility]. It has not been cleared or approved by the Food and Drug Administration. Note:  Values greater than or equal to 75.0 nmol/L may       indicate an independent risk factor for CHD,       but must be evaluated with caution when applied       to non-Caucasian populations due to the       influence of genetic factors on Lp(a) across       ethnicities. Performed At: [facility] [address] [place], [place] [id number] Jennette Shorter MD Ey:[phone]     Thyroid  Function Tests No results for input(s): TSH, T4TOTAL, T3FREE, THYROIDAB in the last 72 hours.  Invalid input(s): FREET3 _____________  CARDIAC CATHETERIZATION Result Date: 05/23/2024   Prox RCA lesion is 70% stenosed.   Ost RCA to Prox RCA lesion is 99% stenosed.   Mid RCA lesion is 70% stenosed. Severe, heavily calcified disease extending from the ostium of the RCA with moderate to severe, calcific disease in the mid vessel.  Unsuccessful attempt at PCI of the RCA Recommendations: Medical management of CAD   ECHOCARDIOGRAM COMPLETE Result Date: 05/22/2024    ECHOCARDIOGRAM REPORT   Patient Name:   Angel [name] Date of Exam: 05/21/2024 Medical Rec #:  [id number]       Height:       63.0 in Accession #:    [id number]      Weight:       187.0 lb Date of Birth:  June 02, 1931       BSA:          1.879 m Patient Age:    89 years        BP:           116/63 mmHg Patient Gender: F               HR:           91 bpm. Exam Location:  [facility] Procedure: 2D Echo, Cardiac Doppler and Color Doppler (Both Spectral and Color            Flow Doppler were utilized during procedure). Indications:     I21.4 NSTEMI  History:         Patient has prior history of Echocardiogram examinations, most                  recent 01/16/2024. CAD and Previous Myocardial Infarction, CVA;                  Risk Factors:Hypertension and Dyslipidemia.  Sonographer:     Carl Coma RDCS Referring Phys:  [id number] CHRISTOPHER END Diagnosing Phys: Timothy Gollan MD IMPRESSIONS  1. Left ventricular ejection fraction, by estimation, is 55 %. Left ventricular ejection fraction by PLAX is 59 %. The left ventricle has normal function. The left ventricle demonstrates regional wall motion abnormalities (mild inferolateral and lateral  wall hypokinesis). Left ventricular diastolic parameters are consistent with Grade I diastolic dysfunction (impaired relaxation).  2. Right ventricular systolic function is normal. The right ventricular size is normal.  3. The mitral valve is normal in structure. Mild mitral valve regurgitation. No evidence of mitral stenosis.  4. The aortic  valve is calcified. There is moderate calcification of the aortic valve. Aortic valve regurgitation is not visualized. Mild to moderate aortic valve stenosis. Aortic valve area, by VTI measures 0.70 cm. Aortic valve mean gradient measures  16.8 mmHg. Aortic valve Vmax measures 2.69 m/s.  5. The inferior vena  cava is normal in size with greater than 50% respiratory variability, suggesting right atrial pressure of 3 mmHg.  6. Challenging images, definity  not used FINDINGS  Left Ventricle: Left ventricular ejection fraction, by estimation, is 55 %. Left ventricular ejection fraction by PLAX is 59 %. The left ventricle has normal function. The left ventricle demonstrates regional wall motion abnormalities. Strain was performed and the global longitudinal strain is indeterminate. The left ventricular internal cavity size was normal in size. There is no left ventricular hypertrophy. Left ventricular diastolic parameters are consistent with Grade I diastolic dysfunction  (impaired relaxation). Right Ventricle: The right ventricular size is normal. No increase in right ventricular wall thickness. Right ventricular systolic function is normal. Left Atrium: Left atrial size was normal in size. Right Atrium: Right atrial size was normal in size. Pericardium: There is no evidence of pericardial effusion. Mitral Valve: The mitral valve is normal in structure. Mild mitral annular calcification. Mild mitral valve regurgitation. No evidence of mitral valve stenosis. Tricuspid Valve: The tricuspid valve is normal in structure. Tricuspid valve regurgitation is not demonstrated. No evidence of tricuspid stenosis. Aortic Valve: The aortic valve is calcified. There is moderate calcification of the aortic valve. Aortic valve regurgitation is not visualized. Mild to moderate aortic stenosis is present. Aortic valve mean gradient measures 16.8 mmHg. Aortic valve peak gradient measures 28.9 mmHg. Aortic valve area, by VTI measures 0.70 cm. Pulmonic Valve: The pulmonic valve was normal in structure. Pulmonic valve regurgitation is not visualized. No evidence of pulmonic stenosis. Aorta: The aortic root is normal in size and structure. Venous: The inferior vena cava is normal in size with greater than 50% respiratory variability, suggesting  right atrial pressure of 3 mmHg. IAS/Shunts: No atrial level shunt detected by color flow Doppler. Additional Comments: 3D was performed not requiring image post processing on an independent workstation and was indeterminate.  LEFT VENTRICLE PLAX 2D LV EF:         Left            Diastology                ventricular     LV e' medial:    4.90 cm/s                ejection        LV E/e' medial:  13.0                fraction by     LV e' lateral:   5.10 cm/s                PLAX is 59      LV E/e' lateral: 12.5                %. LVIDd:         4.20 cm LVIDs:         2.90 cm LV PW:         1.00 cm LV IVS:        1.00 cm LVOT diam:     1.90 cm LV SV:         36 LV SV Index:   19 LVOT Area:  2.84 cm  RIGHT VENTRICLE RV Basal diam:  3.50 cm RV S prime:     14.35 cm/s TAPSE (M-mode): 2.1 cm LEFT ATRIUM             Index        RIGHT ATRIUM           Index LA diam:        4.10 cm 2.18 cm/m   RA Area:     13.10 cm LA Vol (A2C):   38.0 ml 20.22 ml/m  RA Volume:   29.20 ml  15.54 ml/m LA Vol (A4C):   46.3 ml 24.64 ml/m LA Biplane Vol: 42.7 ml 22.72 ml/m  AORTIC VALVE AV Area (Vmax):    0.81 cm AV Area (Vmean):   0.76 cm AV Area (VTI):     0.70 cm AV Vmax:           268.60 cm/s AV Vmean:          192.600 cm/s AV VTI:            0.510 m AV Peak Grad:      28.9 mmHg AV Mean Grad:      16.8 mmHg LVOT Vmax:         77.03 cm/s LVOT Vmean:        51.900 cm/s LVOT VTI:          0.125 m LVOT/AV VTI ratio: 0.25  AORTA Ao Root diam: 3.20 cm Ao Asc diam:  3.90 cm MITRAL VALVE MV Area (PHT): 4.74 cm     SHUNTS MV Decel Time: 160 msec     Systemic VTI:  0.13 m MV E velocity: 63.70 cm/s   Systemic Diam: 1.90 cm MV A velocity: 158.00 cm/s MV E/A ratio:  0.40 Evalene Lunger MD Electronically signed by Evalene Lunger MD Signature Date/Time: 05/22/2024/9:57:00 AM    Final    CARDIAC CATHETERIZATION Result Date: 05/21/2024 Conclusions: Severe two-vessel coronary artery disease with chronic total occlusion of mid LCx and 99%  ostial RCA disease with heavy calcification.  Suspect there is also moderate to severe disease in the mid RCA, though this is suboptimally visualized due to incomplete filling of the RCA secondary to severe ostial disease.  LAD with mild-moderate disease. Mildly elevated left heart and pulmonary artery pressures (PCWP 18 mmHg, LVEDP 20 mmHg, mean PA 26 mmHg). Normal Fick cardiac output/index CO 5.4 L/min, CI 2.9 L/min/m). Moderate-severe aortic valve stenosis (mean gradient 22 mmHg, aortic valve area 0.9 cm). Recommendations: Catheterization findings discussed at length with the patient and her family and also reviewed with the interventional team at [facility].  Will arrange for transfer to [facility] for possible PCI to RCA, which may require atherectomy and/or intravascular lithotripsy given heavy calcification.  Recommend consideration of femoral approach to optimize support for intervention. Resume heparin infusion 2 hours after TR band has been removed. Loaded with clopidogrel  with plans for dual antiplatelet therapy with aspirin  and clopidogrel  for 12 months. Follow-up echocardiogram to reassess LVEF and aortic stenosis. Aggressive secondary prevention of coronary artery disease. Lonni Hanson, MD [facility]  CT Angio Chest/Abd/Pel for Dissection W and/or W/WO Result Date: 05/21/2024 EXAM: CTA CHEST, ABDOMEN AND PELVIS WITH AND WITHOUT CONTRAST 05/21/2024 02:40:53 AM TECHNIQUE: CTA of the chest was performed with and without the administration of intravenous contrast. CTA of the abdomen and pelvis was performed with and without the administration of intravenous contrast. Multiplanar reformatted images are provided for review. MIP images are provided for  review. Automated exposure control, iterative reconstruction, and/or weight based adjustment of the mA/kV was utilized to reduce the radiation dose to as low as reasonably achievable. (iohexol  (OMNIPAQUE ) 350 MG/ML injection 100 mL IOHEXOL   350 MG/ML SOLN) was administered. COMPARISON: None available. CLINICAL HISTORY: Acute aortic syndrome (AAS) suspected. Pt reports chest pain and bilateral jaw pain that began around 1900 tonight. Pt reports she has an aortic valve issue that she follows cardiology for. Pt reports she also feels anxious. FINDINGS: VASCULATURE: AORTA: Ectasia of the ascending thoracic aorta, measuring 3.6 cm (coronal image 55). No evidence of thoracoabdominal aortic aneurysm or dissection. Thoracic aortic atherosclerosis. PULMONARY ARTERIES: No evidence of pulmonary embolism. GREAT VESSELS OF AORTIC ARCH: No acute finding. No dissection. No arterial occlusion or significant stenosis. CELIAC TRUNK: No acute finding. No occlusion or significant stenosis. SUPERIOR MESENTERIC ARTERY: No acute finding. No occlusion or significant stenosis. INFERIOR MESENTERIC ARTERY: No acute finding. No occlusion or significant stenosis. RENAL ARTERIES: No acute finding. No occlusion or significant stenosis. ILIAC ARTERIES: No acute finding. No occlusion or significant stenosis. CHEST: MEDIASTINUM: No mediastinal lymphadenopathy. The heart and pericardium demonstrate no acute abnormality. Moderate 3-vessel coronary atherosclerosis. LUNGS AND PLEURA: Mild subpleural scarring in the lungs bilaterally. No focal consolidation or pulmonary edema. No evidence of pleural effusion or pneumothorax. THORACIC BONES AND SOFT TISSUES: Small hiatal hernia. Mild degenerative changes of the lower thoracic/upper lumbar spine. No acute bone or soft tissue abnormality. ABDOMEN AND PELVIS: LIVER: 3.1 cm posterior right hepatic cyst (image 144), benign. GALLBLADDER AND BILE DUCTS: Gallbladder is unremarkable. No biliary ductal dilatation. SPLEEN: The spleen is unremarkable. PANCREAS: The pancreas is unremarkable. ADRENAL GLANDS: Bilateral adrenal glands demonstrate no acute abnormality. KIDNEYS, URETERS AND BLADDER: No stones in the kidneys or ureters. No hydronephrosis. No  perinephric or periureteral stranding. Periurethral calcifications, likely chronic. Urinary bladder is unremarkable. GI AND BOWEL: Stomach and duodenal sweep demonstrate no acute abnormality. Extensive colonic diverticulosis, without evidence of diverticulitis. The appendix is not discretely visualized, favored to be surgically absent. There is no bowel obstruction. No abnormal bowel wall thickening or distension. REPRODUCTIVE: Status post hysterectomy. PERITONEUM AND RETROPERITONEUM: No ascites or free air. LYMPH NODES: No lymphadenopathy. ABDOMINAL BONES AND SOFT TISSUES: Mild degenerative changes of the lower thoracic/upper lumbar spine. No acute abnormality of the bones. No acute soft tissue abnormality. IMPRESSION: 1. No evidence of thoracoabdominal aortic aneurysm or dissection. 2. No evidence of pulmonary embolism. 3. No acute findings in the chest, abdomen, or pelvis. Electronically signed by: Pinkie Pebbles MD 05/21/2024 02:49 AM EDT RP Workstation: [id number]   DG Chest 2 View Result Date: 05/20/2024 CLINICAL DATA:  Chest pain EXAM: DG CHEST 2V COMPARISON:  09/01/2021 FINDINGS: Heart and mediastinal contours are within normal limits. No focal opacities or effusions. No acute bony abnormality. Aortic atherosclerosis. IMPRESSION: No active cardiopulmonary disease. Electronically Signed   By: Franky Crease M.D.   On: 05/20/2024 21:22    Disposition Pt is being discharged home today in good condition.  Follow-up Plans & Appointments  Discharge Instructions     Amb Referral to Cardiac Rehabilitation   Complete by: As directed    Diagnosis: NSTEMI   After initial evaluation and assessments completed: Virtual Based Care may be provided alone or in conjunction with Phase 2 Cardiac Rehab based on patient barriers.: Yes   Intensive Cardiac Rehabilitation (ICR) [facility] location only OR Traditional Cardiac Rehabilitation (TCR) *If criteria for ICR are not met will enroll in TCR ([facility] only): Yes   Call  MD for:  difficulty breathing, headache or visual disturbances   Complete by: As directed    Call MD for:  persistant dizziness or light-headedness   Complete by: As directed    Call MD for:  redness, tenderness, or signs of infection (pain, swelling, redness, odor or green/yellow discharge around incision site)   Complete by: As directed    Diet - low sodium heart healthy   Complete by: As directed    Discharge instructions   Complete by: As directed    Groin Site Care Refer to this sheet in the next few weeks. These instructions provide you with information on caring for yourself after your procedure. Your caregiver may also give you more specific instructions. Your treatment has been planned according to current medical practices, but problems sometimes occur. Call your caregiver if you have any problems or questions after your procedure. HOME CARE INSTRUCTIONS You may shower 24 hours after the procedure. Remove the bandage (dressing) and gently wash the site with plain soap and water. Gently pat the site dry.  Do not apply powder or lotion to the site.  Do not sit in a bathtub, swimming pool, or whirlpool for 5 to 7 days.  No bending, squatting, or lifting anything over 10 pounds (4.5 kg) as directed by your caregiver.  Inspect the site at least twice daily.  Do not drive home if you are discharged the same day of the procedure. Have someone else drive you.  You may drive 24 hours after the procedure unless otherwise instructed by your caregiver.  What to expect: Any bruising will usually fade within 1 to 2 weeks.  Blood that collects in the tissue (hematoma) may be painful to the touch. It should usually decrease in size and tenderness within 1 to 2 weeks.  SEEK IMMEDIATE MEDICAL CARE IF: You have unusual pain at the groin site or down the affected leg.  You have redness, warmth, swelling, or pain at the groin site.  You have drainage (other than a small amount of blood on the  dressing).  You have chills.  You have a fever or persistent symptoms for more than 72 hours.  You have a fever and your symptoms suddenly get worse.  Your leg becomes pale, cool, tingly, or numb.  You have heavy bleeding from the site. Hold pressure on the site. [name]  PLEASE DO NOT MISS ANY DOSES OF YOUR PLAVIX !!!!! Also keep a log of you blood pressures and bring back to your follow up appt. Please call the office with any questions.   Patients taking blood thinners should generally stay away from medicines like ibuprofen, Advil, Motrin, naproxen, and Aleve due to risk of stomach bleeding. You may take Tylenol  as directed or talk to your primary doctor about alternatives.   PLEASE ENSURE THAT YOU DO NOT RUN OUT OF YOUR PLAVIX . This medication is very important to remain on for at least one year. IF you have issues obtaining this medication due to cost please CALL the office 3-5 business days prior to running out in order to prevent missing doses of this medication.   Increase activity slowly   Complete by: As directed        Discharge Medications Allergies as of 05/26/2024       Reactions   Codeine Other (See Comments)   zoned out   Levofloxacin Other (See Comments)   AMS   Mirabegron  Other (See Comments)   Severe depression   Prednisone Other (See Comments)  AMS   Sulfa Antibiotics Other (See Comments)   AMS   Duloxetine  Other (See Comments)   Caused patient more anxiety         Medication List     STOP taking these medications    amLODipine  2.5 MG tablet Commonly known as: NORVASC    amLODipine  5 MG tablet Commonly known as: NORVASC    chlorthalidone  25 MG tablet Commonly known as: HYGROTON    lisinopril  40 MG tablet Commonly known as: ZESTRIL        TAKE these medications    acetaminophen  325 MG tablet Commonly known as: TYLENOL  Take 2 tablets (650 mg total) by mouth every 4 (four) hours as needed for headache or mild pain (pain score 1-3).    ALPRAZolam  0.25 MG tablet Commonly known as: XANAX  TAKE 1 TABLET BY MOUTH 2 TIMES DAILY AS NEEDED. What changed: See the new instructions.   ascorbic acid 500 MG tablet Commonly known as: VITAMIN C Take 500 mg by mouth daily.   aspirin  EC 81 MG tablet Take 1 tablet (81 mg total) by mouth daily. Swallow whole.   atorvastatin  80 MG tablet Commonly known as: LIPITOR TAKE 1 TABLET BY MOUTH EVERY DAY   busPIRone  10 MG tablet Commonly known as: BUSPAR  TAKE 1 TABLET BY MOUTH TWICE A DAY   clopidogrel  75 MG tablet Commonly known as: PLAVIX  Take 1 tablet (75 mg total) by mouth daily.   ezetimibe 10 MG tablet Commonly known as: ZETIA Take 1 tablet (10 mg total) by mouth daily.   isosorbide mononitrate 60 MG 24 hr tablet Commonly known as: IMDUR Take 1 tablet (60 mg total) by mouth daily.   metoprolol  succinate 50 MG 24 hr tablet Commonly known as: TOPROL -XL Take 1 tablet (50 mg total) by mouth 2 (two) times daily. Take with or immediately following a meal. What changed:  medication strength See the new instructions.   multivitamin with minerals Tabs tablet Take 1 tablet by mouth daily.   nitroGLYCERIN  0.4 MG SL tablet Commonly known as: NITROSTAT  Place 1 tablet (0.4 mg total) under the tongue every 5 (five) minutes as needed for chest pain.   ranolazine 500 MG 12 hr tablet Commonly known as: RANEXA Take 1 tablet (500 mg total) by mouth 2 (two) times daily.   vitamin B-12 100 MCG tablet Commonly known as: CYANOCOBALAMIN Take 1,000 mcg by mouth daily.   vitamin E 180 MG (400 UNITS) capsule Take 400 Units by mouth daily.         Outstanding Labs/Studies BMET/ H&H at follow up  Duration of Discharge Encounter: APP Time: 20 minutes   Signed, Manuelita Rummer, NP 05/26/2024, 8:22 AM

## 2024-05-26 NOTE — Progress Notes (Signed)
 Discharge Nurse Summary: DC order noted per MD. DC RN at bedside with patient. Patient agreeable with discharge plan, family at bedside. AVS printed/reviewed. PIV removed, skin intact. No DME needs. No home meds. TOC meds pending pickup. CP/Edu resolved. Telemonitor returned to charging station. All belongings accounted for. Dressing to wound, CDI w/o bleeding or drainage. See LDAs. Patient/family will wait for TOC meds once open at 10am.  Rosario EMERSON Lund, RN

## 2024-05-26 NOTE — Progress Notes (Signed)
 Rounding Note   Patient Name: Angel [name] Date of Encounter: 05/26/2024  [facility] Cardiologist: Lonni Hanson, MD   Subjective No complaints  Scheduled Meds:  aspirin  EC  81 mg Oral Daily   atorvastatin   80 mg Oral Daily   clopidogrel   75 mg Oral Daily   vitamin B-12  1,000 mcg Oral Daily   docusate sodium   100 mg Oral Daily   ezetimibe  10 mg Oral Daily   isosorbide mononitrate  60 mg Oral Daily   metoprolol  succinate  100 mg Oral Daily   metoprolol  succinate  50 mg Oral QPM   ranolazine  500 mg Oral BID   sodium chloride  flush  3 mL Intravenous Q12H   Continuous Infusions:  PRN Meds: acetaminophen , alum & mag hydroxide-simeth, fentaNYL (SUBLIMAZE) injection, nitroGLYCERIN , ondansetron (ZOFRAN) IV, sodium chloride  flush   Vital Signs  Vitals:   05/25/24 1547 05/25/24 1933 05/25/24 2331 05/26/24 0408  BP: (!) 98/54 135/72 107/61 105/61  Pulse:  95 84 83  Resp:  20 20 19   Temp: 98.6 F (37 C) 98.2 F (36.8 C) 98.1 F (36.7 C) 98.2 F (36.8 C)  TempSrc: Oral Oral Oral Oral  SpO2:  92% 93% 91%  Weight:      Height:        Intake/Output Summary (Last 24 hours) at 05/26/2024 0703 Last data filed at 05/26/2024 0411 Gross per 24 hour  Intake 2150.25 ml  Output 600 ml  Net 1550.25 ml      05/23/2024    7:00 AM 05/22/2024    3:07 AM 05/21/2024    7:29 PM  Last 3 Weights  Weight (lbs) 185 lb 10 oz 182 lb 12.2 oz 184 lb 11.9 oz  Weight (kg) 84.2 kg 82.9 kg 83.8 kg      Telemetry NSR - Personally Reviewed  ECG  N/a - Personally Reviewed  Physical Exam  GEN: No acute distress.   Neck: No JVD Cardiac: RRR, no murmurs, rubs, or gallops.  Respiratory: Clear to auscultation bilaterally. GI: Soft, nontender, non-distended  MS: No edema; No deformity. Neuro:  Nonfocal  Psych: Normal affect   Labs High Sensitivity Troponin:   Recent Labs  Lab 05/20/24 2047 05/21/24 0019 05/21/24 0646  TROPONINIHS 21* 467* 562*      Chemistry Recent Labs  Lab 05/24/24 0313 05/25/24 0214 05/26/24 0225  NA 136 134* 136  K 3.7 3.7 4.2  CL 105 101 106  CO2 20* 21* 20*  GLUCOSE 111* 109* 127*  BUN 40* 51* 40*  CREATININE 1.14* 1.77* 1.46*  CALCIUM  8.7* 8.3* 8.3*  PROT  --  5.8*  --   ALBUMIN  --  2.9*  --   AST  --  74*  --   ALT  --  26  --   ALKPHOS  --  42  --   BILITOT  --  0.8  --   GFRNONAA 45* 27* 34*  ANIONGAP 11 12 10     Lipids  Recent Labs  Lab 05/24/24 0313  CHOL 140  TRIG 204*  HDL 35*  LDLCALC 64  CHOLHDL 4.0    Hematology Recent Labs  Lab 05/24/24 0313 05/25/24 0214 05/25/24 1134 05/26/24 0225  WBC 9.5 9.2  --  9.8  RBC 3.30* 2.90*  --  2.79*  HGB 10.6* 9.4* 9.9* 9.1*  HCT 32.0* 27.9* 30.2* 27.2*  MCV 97.0 96.2  --  97.5  MCH 32.1 32.4  --  32.6  MCHC 33.1 33.7  --  33.5  RDW 13.0 13.1  --  13.1  PLT 254 199  --  202   Thyroid  No results for input(s): TSH, FREET4 in the last 168 hours.  BNPNo results for input(s): BNP, PROBNP in the last 168 hours.  DDimer No results for input(s): DDIMER in the last 168 hours.   Radiology  No results found.  Cardiac Studies   Patient Profile   89 y.o. female with moderate to severe AS presents with NSTEMI. Underwent cardiac cath at [facility] with findings of CTO of small LCx and critical ostial RCA stenosis which is culprit. Transferred for consideration of complex PCI   Assessment & Plan   1.CAD/NSTEMI - trop up to 562 - Underwent cardiac catheterization on 05/21/24 at [facility] with severe two-vessel CAD with CTO of mid circumflex and 99% ostial RCA disease with heavy calcification.  - attempted PCI 05/23/24 unsuccessful since unable to engage the RCA. Critical ostial lesion and difficult take off. Will need to focus on medical management.  - 05/2024 echo: LVEF 55%   - some transient chest pain after cath, started on NG drip. Now off drip with resolved symptoms.  - medical therapy with ASA 81, plavix  75, atorva 80, zetia  10mg , imdur 59m, toprol  100mg  in AM and 50mg  in pm, ranexa 500mg  bid. Some soft bp's yesterday, lower toprol  to 50mg  bid.    2. Aortic stenosis - - Echo 10/14 with mild to moderate aortic valve stenosis, mean gradient of 16.8 mmHg. -- Cath data with moderate to severe aortic valve stenosis with a mean gradient of 22 mmHg and aortic valve area of 0.9 cm   3. Anemia - decline in Hgb since 10/16 cath. 12.1-->10.6-->9.4. Had heavy bruising at cath site. No significant palpable hematoma on exam - repeat H&H yesterday was uptrending, given some fluids last night with some dilutional effect on Hgb this AM, overall stable   4.AKI - Cr up to 1.77 today, likely contrast related.  - improved with IVFs   Ok for discharge today, she asks to f/u with Dr [place].   For questions or updates, please contact [facility] Please consult [email or web address] for contact info under       Signed, Alvan Carrier, MD  05/26/2024, 7:03 AM

## 2024-05-27 ENCOUNTER — Telehealth: Payer: Self-pay

## 2024-05-27 MED ORDER — NITROGLYCERIN 0.4 MG SL SUBL: 0.4000 mg | SUBLINGUAL_TABLET | SUBLINGUAL | 3 refills | Status: DC | PRN

## 2024-05-27 NOTE — Telephone Encounter (Signed)
 Yes, please refill her sublingual NTG.  Thanks.  Lonni Hanson, MD [facility]

## 2024-05-27 NOTE — Transitions of Care (Post Inpatient/ED Visit) (Signed)
   05/27/2024  Name: Angel [name] MRN: [id number] DOB: 27-Jan-1931  Today's TOC FU Call Status: Today's TOC FU Call Status:: Unsuccessful Call (1st Attempt) Unsuccessful Call (1st Attempt) Date: 05/27/24  Attempted to reach the patient regarding the most recent Inpatient/ED visit.  Follow Up Plan: Additional outreach attempts will be made to reach the patient to complete the Transitions of Care (Post Inpatient/ED visit) call.   Signature  Charmaine Bloodgood, LPN [facility] Advisor [place] [facility] You Are. We Are. One [facility] Direct Dial [phone]

## 2024-05-27 NOTE — Transitions of Care (Post Inpatient/ED Visit) (Signed)
 05/27/2024  Name: Angel [name] MRN: [id number] DOB: 05/31/1931  Today's TOC FU Call Status: Today's TOC FU Call Status:: Successful TOC FU Call Completed Unsuccessful Call (1st Attempt) Date: 05/27/24 [facility] FU Call Complete Date: 05/27/24 Patient's Name and Date of Birth confirmed.  Transition Care Management Follow-up Telephone Call Date of Discharge: 05/26/24 Discharge Facility: [facility]) Type of Discharge: Inpatient Admission Primary Inpatient Discharge Diagnosis:: NSTEMI How have you been since you were released from the hospital?: Better Any questions or concerns?: No  Items Reviewed: Did you receive and understand the discharge instructions provided?: Yes Medications obtained,verified, and reconciled?: Yes (Medications Reviewed) Any new allergies since your discharge?: No Dietary orders reviewed?: Yes Type of Diet Ordered:: heart healthy Do you have support at home?: Yes  Medications Reviewed Today: Medications Reviewed Today     Reviewed by Lavelle Charmaine NOVAK, LPN (Licensed Practical Nurse) on 05/27/24 at 1342  Med List Status: <None>   Medication Order Taking? Sig Documenting Provider Last Dose Status Informant  acetaminophen  (TYLENOL ) 325 MG tablet [id number]  Take 2 tablets (650 mg total) by mouth every 4 (four) hours as needed for headache or mild pain (pain score 1-3). Josette Ade, MD  Active Self, Spouse/Significant Other, Pharmacy Records, Multiple Informants  ALPRAZolam  (XANAX ) 0.25 MG tablet [id number]  TAKE 1 TABLET BY MOUTH 2 TIMES DAILY AS NEEDED.  Patient taking differently: Take 0.25 mg by mouth 2 (two) times daily as needed for sleep or anxiety.   Lendia Boby CROME, NP-C  Active Self, Spouse/Significant Other, Pharmacy Records, Multiple Informants  ascorbic acid (VITAMIN C) 500 MG tablet [id number]  Take 500 mg by mouth daily. [provider]  Active Self, Spouse/Significant Other, Pharmacy Records, Multiple Informants  aspirin  EC 81 MG EC  tablet [id number]  Take 1 tablet (81 mg total) by mouth daily. Swallow whole. Vann, Jessica U, DO  Active Self, Spouse/Significant Other, Pharmacy Records, Multiple Informants  atorvastatin  (LIPITOR) 80 MG tablet [id number]  TAKE 1 TABLET BY MOUTH EVERY DAY End, Lonni, MD  Active Self, Spouse/Significant Other, Pharmacy Records, Multiple Informants  busPIRone  (BUSPAR ) 10 MG tablet [id number]  TAKE 1 TABLET BY MOUTH TWICE A DAY Henson, [name] L, NP-C  Active Self, Spouse/Significant Other, Pharmacy Records, Multiple Informants           Med Note STEFFI NIAN   Wed May 22, 2024  8:09 AM) Patient does not believe this medication is helping    clopidogrel  (PLAVIX ) 75 MG tablet [id number]  Take 1 tablet (75 mg total) by mouth daily. Henry Manuelita NOVAK, NP  Active   ezetimibe (ZETIA) 10 MG tablet [id number]  Take 1 tablet (10 mg total) by mouth daily. Henry Manuelita NOVAK, NP  Active   isosorbide mononitrate (IMDUR) 60 MG 24 hr tablet [id number]  Take 1 tablet (60 mg total) by mouth daily. Henry Manuelita NOVAK, NP  Active   metoprolol  succinate (TOPROL -XL) 50 MG 24 hr tablet [id number]  Take 1 tablet (50 mg total) by mouth 2 (two) times daily. Take with or immediately following a meal. Henry Manuelita NOVAK, NP  Active   Multiple Vitamin (MULTIVITAMIN WITH MINERALS) TABS tablet [id number]  Take 1 tablet by mouth daily. [provider]  Active Self, Spouse/Significant Other, Pharmacy Records, Multiple Informants  nitroGLYCERIN  (NITROSTAT ) 0.4 MG SL tablet [id number]  Place 1 tablet (0.4 mg total) under the tongue every 5 (five) minutes as needed for chest pain. End, Lonni, MD  Active   ranolazine (RANEXA) 500 MG 12 hr tablet  [id number]  Take 1 tablet (500 mg total) by mouth 2 (two) times daily. Henry Manuelita NOVAK, NP  Active   vitamin B-12 (CYANOCOBALAMIN) 100 MCG tablet [id number]  Take 1,000 mcg by mouth daily.  [provider]  Active Self, Spouse/Significant Other, Pharmacy Records,  Multiple Informants  vitamin E 180 MG (400 UNITS) capsule [id number]  Take 400 Units by mouth daily. [provider]  Active Self, Spouse/Significant Other, Pharmacy Records, Multiple Informants            Home Care and Equipment/Supplies: Were Home Health Services Ordered?: NA Any new equipment or medical supplies ordered?: NA  Functional Questionnaire: Do you need assistance with bathing/showering or dressing?: No Do you need assistance with meal preparation?: No Do you need assistance with eating?: No Do you have difficulty maintaining continence: No Do you need assistance with getting out of bed/getting out of a chair/moving?: Yes Do you have difficulty managing or taking your medications?: No  Follow up appointments reviewed: PCP Follow-up appointment confirmed?: NA Specialist Hospital Follow-up appointment confirmed?: Yes Date of Specialist follow-up appointment?: 06/03/24 Follow-Up Specialty Provider:: Bernardino Bring- cardiology Do you need transportation to your follow-up appointment?: No Do you understand care options if your condition(s) worsen?: Yes-patient verbalized understanding    SIGNATURE Charmaine Bloodgood, LPN [facility] Advisor [place] [facility] You Are. We Are. One [facility] Direct Dial [phone]

## 2024-05-30 ENCOUNTER — Telehealth: Payer: Self-pay

## 2024-05-30 NOTE — Telephone Encounter (Signed)
 Copied from CRM [id number]. Topic: Appointments - Appointment Scheduling >> May 29, 2024  4:47 PM Rea ORN wrote: Pt companion Glendia called to schedule a hospital follow up with PCP virtually. Pt needs palliative care. Due to pt insurance, please call back [phone]) to advise if this can be done. >> May 30, 2024  7:43 AM CMA Kaylyn C wrote: Wrong office

## 2024-05-30 NOTE — Telephone Encounter (Signed)
 Angel [name] has spoke with POA, Glade (daughter in law) and notified her that medicare does not cover virtual appts. To avoid having her come back in office, she advised pt has cardiology appt 10/28 and can ask them for referral. If they are not comfortable placing this Angel [name] advised to let us  know and we can go from there.

## 2024-05-31 ENCOUNTER — Other Ambulatory Visit: Payer: Self-pay | Admitting: Family Medicine

## 2024-05-31 DIAGNOSIS — R5381 Other malaise: Secondary | ICD-10-CM

## 2024-05-31 DIAGNOSIS — I252 Old myocardial infarction: Secondary | ICD-10-CM

## 2024-05-31 DIAGNOSIS — I25119 Atherosclerotic heart disease of native coronary artery with unspecified angina pectoris: Secondary | ICD-10-CM

## 2024-05-31 NOTE — Addendum Note (Signed)
 Addended by: Nnamdi Dacus E on: 05/31/2024 03:44 PM   Modules accepted: Orders

## 2024-06-03 ENCOUNTER — Ambulatory Visit: Admitting: Physician Assistant

## 2024-06-04 ENCOUNTER — Telehealth: Payer: Self-pay

## 2024-06-04 ENCOUNTER — Ambulatory Visit (INDEPENDENT_AMBULATORY_CARE_PROVIDER_SITE_OTHER): Admitting: Physician Assistant

## 2024-06-04 ENCOUNTER — Encounter: Payer: Self-pay | Admitting: Physician Assistant

## 2024-06-04 ENCOUNTER — Telehealth: Payer: Self-pay | Admitting: Internal Medicine

## 2024-06-04 VITALS — BP 120/64 | HR 92 | Ht 63.0 in | Wt 185.6 lb

## 2024-06-04 DIAGNOSIS — I25118 Atherosclerotic heart disease of native coronary artery with other forms of angina pectoris: Secondary | ICD-10-CM | POA: Insufficient documentation

## 2024-06-04 DIAGNOSIS — Z79899 Other long term (current) drug therapy: Secondary | ICD-10-CM | POA: Diagnosis not present

## 2024-06-04 DIAGNOSIS — I10 Essential (primary) hypertension: Secondary | ICD-10-CM | POA: Insufficient documentation

## 2024-06-04 DIAGNOSIS — I214 Non-ST elevation (NSTEMI) myocardial infarction: Secondary | ICD-10-CM | POA: Insufficient documentation

## 2024-06-04 DIAGNOSIS — R072 Precordial pain: Secondary | ICD-10-CM | POA: Diagnosis not present

## 2024-06-04 DIAGNOSIS — E785 Hyperlipidemia, unspecified: Secondary | ICD-10-CM | POA: Diagnosis not present

## 2024-06-04 DIAGNOSIS — I350 Nonrheumatic aortic (valve) stenosis: Secondary | ICD-10-CM

## 2024-06-04 DIAGNOSIS — I130 Hypertensive heart and chronic kidney disease with heart failure and stage 1 through stage 4 chronic kidney disease, or unspecified chronic kidney disease: Secondary | ICD-10-CM | POA: Diagnosis not present

## 2024-06-04 MED ORDER — ATORVASTATIN CALCIUM 20 MG PO TABS: 20.0000 mg | ORAL_TABLET | Freq: Every day | ORAL | 3 refills | Status: DC

## 2024-06-04 MED ORDER — ATORVASTATIN CALCIUM 80 MG PO TABS
80.0000 mg | ORAL_TABLET | Freq: Every day | ORAL | 3 refills | Status: DC
Start: 2024-06-04 — End: 2024-06-06

## 2024-06-04 MED ORDER — FUROSEMIDE 20 MG PO TABS: 20.0000 mg | ORAL_TABLET | Freq: Every day | ORAL | 3 refills | Status: DC

## 2024-06-04 NOTE — Telephone Encounter (Signed)
 Spoke w daughter in law stacy and they have received the referral, no action needed at this time

## 2024-06-04 NOTE — Telephone Encounter (Signed)
 Called Tracey back with hospice. Told Dwayne that we typically do not do hospice referrals. Gave her patient's PCP name and number of practice.

## 2024-06-04 NOTE — Telephone Encounter (Signed)
 Per hospice daughter is requesting a referral to be sent to hospice.

## 2024-06-04 NOTE — Patient Instructions (Signed)
 Medication Instructions:  Your physician recommends the following medication changes.  START TAKING: Lasix 20 mg daily  *If you need a refill on your cardiac medications before your next appointment, please call your pharmacy*  Lab Work: Your provider would like for you to have following labs drawn today CBC and BMeT.   If you have labs (blood work) drawn today and your tests are completely normal, you will receive your results only by: MyChart Message (if you have MyChart) OR A paper copy in the mail If you have any lab test that is abnormal or we need to change your treatment, we will call you to review the results.  Follow-Up: At [facility], you and your health needs are our priority.  As part of our continuing mission to provide you with exceptional heart care, our providers are all part of one team.  This team includes your primary Cardiologist (physician) and Advanced Practice Providers or APPs (Physician Assistants and Nurse Practitioners) who all work together to provide you with the care you need, when you need it.  Your next appointment:   13 November  Provider:   You may see Lonni Hanson, MD or Lesley Maffucci, PA-C

## 2024-06-04 NOTE — Progress Notes (Signed)
 Cardiology Office Note    Date:  06/04/2024   ID:  Angel Angel [name], Angel Angel [name] 09/21/1930, MRN [id number]  PCP:  Lendia Boby CROME, NP-C  Cardiologist:  Lonni Hanson, MD  Electrophysiologist:  None   Chief Complaint: Hospital follow up  History of Present Illness:   Angel Angel [name] is a 89 y.o. female with history of moderate to severe aortic stenosis, hypertension, hyperlipidemia, stroke, and coronary artery disease who presents for hospital follow-up.    Angel Angel [name] has a history of abnormal myocardial perfusion stress test and questional small apical infarct versus diverticulum by cardiac MRI 10+ years ago.  She has previously declined invasive testing and has been medically managed in Angel meantime.  Most recent echo 01/2024 showed EF 60 to 65% with mild LVH, G1 DD, and moderate aortic valve stenosis with mean gradient 22 mmHg and V-max 3.18 m/s.     She reported to Angel emergency department 05/21/2024 with intermittent episodes of bilateral jaw tightness over Angel past few weeks.  These episodes typically occurred after eating and resolved within 30 minutes.  Not associated with exertion.  Prior to presenting to Angel ED, she had an episode of bilateral jaw tightness which radiated to Angel center of her chest which she described as a pressure.  EKG showed tachycardia with poor R wave progression, consistent with prior tracings and without acute ischemic changes.  Troponin 21 > 467 > 562.  Right and left cardiac catheterization revealed severe two-vessel CAD with chronic total occlusion of Angel mid circumflex and 99% ostial RCA disease with heavy calcification.  There was also suspected severe disease in Angel mid RCA which was suboptimally visualized due to incomplete filling of Angel RCA due to his severe ostial disease.  LAD also with mild to moderate disease.  Mildly elevated left heart and pulmonary artery pressures (PCWP 18 mmHg, LVEDP 20 mmHg, mean PA 26 mmHg).  Normal cardiac output.  Moderate to  severe aortic valve stenosis with mean gradient 22 mmHg and aortic valve area 0.9 cm.  Echo showed EF 55% with mild inferolateral and lateral wall hypokinesis, G1 DD, mild MR, and mild to moderate aortic valve stenosis with VTI 0.7cm and gradient 16.8 mmHg.  Angel [name] was transferred to [facility] for possible PCI.  Coronary angiography revealed proximal RCA stenosis 70% ostial RCA to proximal RCA stenosis 99% and mid RCA lesion 70% stenosed.  Unsuccessful attempt at PCI to Angel RCA with recommendation for medical management of CAD.  She had recurrent chest pain following cath requiring IV nitroglycerin .  Hemoglobin dropped to 9.4 and she was noted to have heavy bruising at Angel cath site without palpable hematoma on exam.  Toprol  dose was increased to 100 mg in Angel a.m. and 50 mg in Angel p.m.  Imdur was increased to 60 mg daily and Ranexa was added on 5 mg twice daily.  Lisinopril  was discontinued to allow for more BP for antianginal titration.  Angel [name] was discharged in stable condition on 05/26/2024.  Angel [name] presents for follow-up today with several concerns.  She worries that she may be retaining fluid and has noted weight gain, dyspnea on exertion, dry cough, and mild orthopnea.  She also reports significant constipation since hospital discharge.  She reports that overall jaw/chest pain has improved since discharge, although she has taken 3 to 4 sublingual nitroglycerin  in Angel past week.  She is easing back into regular activity but she is still regaining her energy.  She had significant bruising at her cath  site during hospitalization which continues to improve.  She denies palpitations, lightheadedness and dizziness.  No bleeding or hematochezia.  Labs independently reviewed: 05/2024-sodium 136, potassium 4.2, BUN 40, creatinine 1.46, hemoglobin 9.1, hematocrit 27.2, platelets 202, TC 140, TG 204, HDL 35, LDL 64  Objective   Past Medical History:  Diagnosis Date   Acute CVA (cerebrovascular accident)  (HCC) 06/28/2020   Anemia 05/26/2024   Anxiety    Aortic aneurysm without rupture    Aortic stenosis    mild-moderate by echo   Arthritis    CAD (coronary artery disease)    Chronic hip pain    Endometriosis    Hearing loss    HTN, goal below 150/90    Hyperlipidemia LDL goal <70 11/18/2014   NSTEMI (non-ST elevated myocardial infarction) (HCC) 05/21/2024   Osteopenia of Angel elderly    Urge and stress incontinence     Current Medications: Current Meds  Medication Sig   acetaminophen  (TYLENOL ) 325 MG tablet Take 2 tablets (650 mg total) by mouth every 4 (four) hours as needed for headache or mild pain (pain score 1-3).   ALPRAZolam  (XANAX ) 0.25 MG tablet TAKE 1 TABLET BY MOUTH 2 TIMES DAILY AS NEEDED. (Angel [name] taking differently: Take 0.25 mg by mouth 2 (two) times daily as needed for sleep or anxiety.)   ascorbic acid (VITAMIN C) 500 MG tablet Take 500 mg by mouth daily.   aspirin  EC 81 MG EC tablet Take 1 tablet (81 mg total) by mouth daily. Swallow whole.   busPIRone  (BUSPAR ) 10 MG tablet TAKE 1 TABLET BY MOUTH TWICE A DAY   clopidogrel  (PLAVIX ) 75 MG tablet Take 1 tablet (75 mg total) by mouth daily.   ezetimibe (ZETIA) 10 MG tablet Take 1 tablet (10 mg total) by mouth daily.   isosorbide mononitrate (IMDUR) 60 MG 24 hr tablet Take 1 tablet (60 mg total) by mouth daily.   metoprolol  succinate (TOPROL -XL) 50 MG 24 hr tablet Take 1 tablet (50 mg total) by mouth 2 (two) times daily. Take with or immediately following a meal.   Multiple Vitamin (MULTIVITAMIN WITH MINERALS) TABS tablet Take 1 tablet by mouth daily.   nitroGLYCERIN  (NITROSTAT ) 0.4 MG SL tablet Place 1 tablet (0.4 mg total) under Angel tongue every 5 (five) minutes as needed for chest pain.   ranolazine (RANEXA) 500 MG 12 hr tablet Take 1 tablet (500 mg total) by mouth 2 (two) times daily.   vitamin B-12 (CYANOCOBALAMIN) 100 MCG tablet Take 1,000 mcg by mouth daily.    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  Tobacco Use   Smoking status: Never   Smokeless tobacco: Never  Vaping Use   Vaping status: Never Used  Substance and Sexual Activity   Alcohol use: Yes    Alcohol/week: 1.0 standard drink of alcohol    Types: 1 Cans of beer per week    Comment: once a month   Drug use: No   Sexual activity: Not on file  Other Topics Concern   Not  on file  Social History Narrative   Not on file   Social Drivers of Health   Financial Resource Strain: Low Risk  (06/22/2023)   Overall Financial Resource Strain (CARDIA)    Difficulty of Paying Living Expenses: Not very hard  Food Insecurity: No Food Insecurity (05/22/2024)   Hunger Vital Sign    Worried About Running Out of Food in Angel Last Year: Never true    Ran Out of Food in Angel Last Year: Never true  Transportation Needs: No Transportation Needs (05/22/2024)   PRAPARE - Administrator, Civil Service (Medical): No    Lack of Transportation (Non-Medical): No  Physical Activity: Inactive (06/22/2023)   Exercise Vital Sign    Days of Exercise per Week: 0 days    Minutes of Exercise per Session: 0 min  Stress: No Stress Concern Present (06/22/2023)   [employer] of Occupational Health - Occupational Stress Questionnaire    Feeling of Stress : Only a little  Social Connections: Unknown (05/22/2024)   Social Connection and Isolation Panel    Frequency of Communication with Friends and Family: Angel [name] declined    Frequency of Social Gatherings with Friends and Family: Not on file    Attends  Religious Services: Angel [name] declined    Active Member of Clubs or Organizations: Angel [name] declined    Attends Banker Meetings: Angel [name] declined    Marital Status: Angel [name] declined     Family History:  Angel Angel [name]'s family history includes Breast cancer (age of onset: 38) in her maternal grandmother; Cancer in her brother; Heart disease in her maternal grandmother; Stroke in her mother.  ROS:   12-point review of systems is negative unless otherwise noted in Angel HPI.  EKGs/Other Studies Reviewed:    Studies reviewed were summarized above. Angel additional studies were reviewed today:  05/23/2024 Coronary angiography   Prox RCA lesion is 70% stenosed.   Ost RCA to Prox RCA lesion is 99% stenosed.   Mid RCA lesion is 70% stenosed.   Severe, heavily calcified disease extending from Angel ostium of Angel RCA with moderate to severe, calcific disease in Angel mid vessel.  Unsuccessful attempt at PCI of Angel RCA  05/21/2024 Echo complete 1. Left ventricular ejection fraction, by estimation, is 55 %. Left  ventricular ejection fraction by PLAX is 59 %. Angel left ventricle has  normal function. Angel left ventricle demonstrates regional wall motion  abnormalities (mild inferolateral and lateral   wall hypokinesis). Left ventricular diastolic parameters are consistent  with Grade I diastolic dysfunction (impaired relaxation).   2. Right ventricular systolic function is normal. Angel right ventricular  size is normal.   3. Angel mitral valve is normal in structure. Mild mitral valve  regurgitation. No evidence of mitral stenosis.   4. Angel aortic valve is calcified. There is moderate calcification of Angel  aortic valve. Aortic valve regurgitation is not visualized. Mild to  moderate aortic valve stenosis. Aortic valve area, by VTI measures 0.70  cm. Aortic valve mean gradient measures   16.8 mmHg. Aortic valve Vmax measures 2.69 m/s.   5. Angel inferior vena cava is normal in size with greater  than 50%  respiratory variability, suggesting right atrial pressure of 3 mmHg.   6. Challenging images, definity  not used   05/21/2024 LHC Severe two-vessel coronary artery disease with chronic total occlusion of mid LCx and 99% ostial RCA disease with heavy calcification.  Suspect there is also moderate to severe disease in Angel mid RCA,  though this is suboptimally visualized due to incomplete filling of Angel RCA secondary to severe ostial disease.  LAD with mild-moderate disease. Mildly elevated left heart and pulmonary artery pressures (PCWP 18 mmHg, LVEDP 20 mmHg, mean PA 26 mmHg). Normal Fick cardiac output/index CO 5.4 L/min, CI 2.9 L/min/m). Moderate-severe aortic valve stenosis (mean gradient 22 mmHg,  EKG:  EKG personally reviewed by me today EKG Interpretation Date/Time:  Tuesday June 04 2024 14:37:14 EDT Ventricular Rate:  92 PR Interval:  172 QRS Duration:  100 QT Interval:  366 QTC Calculation: 452 R Axis:   -16  Text Interpretation: Normal sinus rhythm Moderate voltage criteria for LVH, may be normal variant ( R in aVL , Cornell product ) Inferior infarct (cited on or before 23-May-2024) Anterior infart vs lead placement Confirmed by Lorene Sinclair ([id number]) on 06/04/2024 2:48:08 PM  PHYSICAL EXAM:    VS:  BP 120/64 (BP Location: Left Wrist, Angel [name] Position: Sitting, Cuff Size: Normal)   Pulse 92 Comment: 95 oximeter  Ht 5' 3 (1.6 m)   Wt 185 lb 9.6 oz (84.2 kg)   LMP  (LMP Unknown)   SpO2 95%   BMI 32.88 kg/m   BMI: Body mass index is 32.88 kg/m.  GEN: Well nourished, well developed in no acute distress NECK: No JVD; No carotid bruits CARDIAC: RRR, II-III/VI systolic murmurs, no rubs or gallops.  Right femoral cath site is stable with mild ecchymosis. RESPIRATORY: Faint crackles in Angel bases bilaterally ABDOMEN: Soft, non-tender, non-distended EXTREMITIES: 1+ LE edema; No deformity  Wt Readings from Last 3 Encounters:  06/04/24 185 lb 9.6 oz (84.2 kg)   05/23/24 185 lb 10 oz (84.2 kg)  05/21/24 184 lb 11.9 oz (83.8 kg)                 Cardiac Rehabilitation Eligibility Assessment  Angel Angel [name] is NOT ready to start cardiac rehabilitation due to: Other (She would like her bruising to recover more prior to starting)    ASSESSMENT & PLAN:   Coronary artery disease - Recent hospitalization 10/14 through 10/19 for NSTEMI.  LHC showed severe two-vessel CAD with chronic total occlusion of Angel mid left circumflex and 99% ostial RCA disease with heavy calcification and suspected moderate to severe disease of Angel mid RCA.  Echo with preserved EF 55%.  She was transferred to [facility] and underwent coronary angiography which revealed severe heavily calcified disease extending from Angel ostium of Angel RCA with moderate to severe calcific disease in Angel mid vessel with unsuccessful attempt at PCI to Angel RCA.  Angel [name] had heavy bruising at Angel cath site with a drop in hemoglobin during hospitalization.  Right femoral site assessed today and appears to be improving with mild bruising noted.  Update CBC and BMP today.  She reports overall improvement in chest pain although she has taken 3-4 sublingual nitroglycerin  since discharge.  She is continued on aspirin  81 mg daily, atorvastatin  80 mg daily, Plavix  75 mg daily, Zetia 10 mg daily, Imdur 60 mg daily, Toprol  50 mg twice daily, and Ranexa 500 mg twice daily.  Can consider increasing Ranexa follow-up if she continues to have breakthrough chest pain.  She has been referred for cardiac rehab but would like to defer until her next visit.  Aortic valve stenosis - Echo 10/14 with mild to moderate aortic valve stenosis mean gradient of 16.8 mmHg with cath data showing moderate to severe aortic valve stenosis with mean gradient of 22 mmHg and valve area of 0.9  cm.  Not a candidate for TAVR given unstable coronary artery disease.  Mildly volume overloaded on exam today.  Start Lasix 20 mg daily.  Anticipate repeat  BMP at follow-up.  Hypertension - BP well-controlled.  She is continued on metoprolol  and Imdur as above.  Hyperlipidemia - LDL 64.  She is continued on atorvastatin  80 mg daily and Zetia 10 mg daily.  Can discuss PCSK9 inhibitor at follow-up.    Disposition: Check CBC and BMP.  Start Lasix 20 mg daily.  F/u with Dr. Mady or an APP in 2 weeks.   Medication Adjustments/Labs and Tests Ordered: Current medicines are reviewed at length with Angel Angel [name] today.  Concerns regarding medicines are outlined above. Medication changes, Labs and Tests ordered today are summarized above and listed in Angel Angel [name] Instructions accessible in Encounters.   Signed, Lesley Maffucci, PA-C 06/04/2024 4:43 PM     [facility] [place] [address] [place], [place] [address] [phone]

## 2024-06-04 NOTE — Telephone Encounter (Signed)
 Copied from CRM [id number]. Topic: Referral - Request for Referral >> Jun 04, 2024  2:14 PM Tinnie BROCKS wrote: Randine with [facility] is calling to request a referral to hospice on the behalf of this patient's family. They have requested their services. The best fax # to send referral is Fax# [phone]

## 2024-06-05 ENCOUNTER — Emergency Department (HOSPITAL_COMMUNITY)

## 2024-06-05 ENCOUNTER — Inpatient Hospital Stay (HOSPITAL_COMMUNITY)
Admission: EM | Admit: 2024-06-05 | Discharge: 2024-06-06 | DRG: 280 | Disposition: A | Discharge status: Hospice | Attending: Internal Medicine | Admitting: Internal Medicine

## 2024-06-05 ENCOUNTER — Other Ambulatory Visit: Payer: Self-pay

## 2024-06-05 ENCOUNTER — Ambulatory Visit: Payer: Self-pay | Admitting: Physician Assistant

## 2024-06-05 ENCOUNTER — Encounter (HOSPITAL_COMMUNITY): Payer: Self-pay

## 2024-06-05 DIAGNOSIS — Z882 Allergy status to sulfonamides status: Secondary | ICD-10-CM

## 2024-06-05 DIAGNOSIS — Z8673 Personal history of transient ischemic attack (TIA), and cerebral infarction without residual deficits: Secondary | ICD-10-CM

## 2024-06-05 DIAGNOSIS — Z90710 Acquired absence of both cervix and uterus: Secondary | ICD-10-CM | POA: Diagnosis not present

## 2024-06-05 DIAGNOSIS — I2510 Atherosclerotic heart disease of native coronary artery without angina pectoris: Secondary | ICD-10-CM | POA: Diagnosis present

## 2024-06-05 DIAGNOSIS — J9600 Acute respiratory failure, unspecified whether with hypoxia or hypercapnia: Secondary | ICD-10-CM | POA: Diagnosis present

## 2024-06-05 DIAGNOSIS — I350 Nonrheumatic aortic (valve) stenosis: Secondary | ICD-10-CM | POA: Diagnosis present

## 2024-06-05 DIAGNOSIS — M25559 Pain in unspecified hip: Secondary | ICD-10-CM | POA: Diagnosis present

## 2024-06-05 DIAGNOSIS — J810 Acute pulmonary edema: Principal | ICD-10-CM

## 2024-06-05 DIAGNOSIS — I509 Heart failure, unspecified: Secondary | ICD-10-CM | POA: Diagnosis not present

## 2024-06-05 DIAGNOSIS — E872 Acidosis, unspecified: Secondary | ICD-10-CM | POA: Diagnosis present

## 2024-06-05 DIAGNOSIS — R7303 Prediabetes: Secondary | ICD-10-CM | POA: Diagnosis present

## 2024-06-05 DIAGNOSIS — M8580 Other specified disorders of bone density and structure, unspecified site: Secondary | ICD-10-CM | POA: Diagnosis present

## 2024-06-05 DIAGNOSIS — Z7902 Long term (current) use of antithrombotics/antiplatelets: Secondary | ICD-10-CM

## 2024-06-05 DIAGNOSIS — N1832 Chronic kidney disease, stage 3b: Secondary | ICD-10-CM | POA: Diagnosis present

## 2024-06-05 DIAGNOSIS — Z79899 Other long term (current) drug therapy: Secondary | ICD-10-CM | POA: Diagnosis not present

## 2024-06-05 DIAGNOSIS — F419 Anxiety disorder, unspecified: Secondary | ICD-10-CM | POA: Diagnosis present

## 2024-06-05 DIAGNOSIS — E785 Hyperlipidemia, unspecified: Secondary | ICD-10-CM | POA: Diagnosis present

## 2024-06-05 DIAGNOSIS — I130 Hypertensive heart and chronic kidney disease with heart failure and stage 1 through stage 4 chronic kidney disease, or unspecified chronic kidney disease: Secondary | ICD-10-CM | POA: Diagnosis present

## 2024-06-05 DIAGNOSIS — Z7982 Long term (current) use of aspirin: Secondary | ICD-10-CM

## 2024-06-05 DIAGNOSIS — R072 Precordial pain: Secondary | ICD-10-CM | POA: Diagnosis present

## 2024-06-05 DIAGNOSIS — I5033 Acute on chronic diastolic (congestive) heart failure: Secondary | ICD-10-CM | POA: Diagnosis present

## 2024-06-05 DIAGNOSIS — Z66 Do not resuscitate: Secondary | ICD-10-CM | POA: Diagnosis present

## 2024-06-05 DIAGNOSIS — I719 Aortic aneurysm of unspecified site, without rupture: Secondary | ICD-10-CM | POA: Diagnosis present

## 2024-06-05 DIAGNOSIS — Z9841 Cataract extraction status, right eye: Secondary | ICD-10-CM

## 2024-06-05 DIAGNOSIS — Z881 Allergy status to other antibiotic agents status: Secondary | ICD-10-CM

## 2024-06-05 DIAGNOSIS — H9190 Unspecified hearing loss, unspecified ear: Secondary | ICD-10-CM | POA: Diagnosis present

## 2024-06-05 DIAGNOSIS — I959 Hypotension, unspecified: Secondary | ICD-10-CM | POA: Diagnosis not present

## 2024-06-05 DIAGNOSIS — Z9842 Cataract extraction status, left eye: Secondary | ICD-10-CM

## 2024-06-05 DIAGNOSIS — Z885 Allergy status to narcotic agent status: Secondary | ICD-10-CM

## 2024-06-05 DIAGNOSIS — Z8249 Family history of ischemic heart disease and other diseases of the circulatory system: Secondary | ICD-10-CM | POA: Diagnosis not present

## 2024-06-05 DIAGNOSIS — D631 Anemia in chronic kidney disease: Secondary | ICD-10-CM | POA: Diagnosis present

## 2024-06-05 DIAGNOSIS — I214 Non-ST elevation (NSTEMI) myocardial infarction: Secondary | ICD-10-CM | POA: Diagnosis present

## 2024-06-05 DIAGNOSIS — R000 Tachycardia, unspecified: Secondary | ICD-10-CM | POA: Diagnosis present

## 2024-06-05 DIAGNOSIS — G8929 Other chronic pain: Secondary | ICD-10-CM | POA: Diagnosis present

## 2024-06-05 DIAGNOSIS — K5909 Other constipation: Secondary | ICD-10-CM | POA: Diagnosis present

## 2024-06-05 DIAGNOSIS — Z888 Allergy status to other drugs, medicaments and biological substances status: Secondary | ICD-10-CM

## 2024-06-05 DIAGNOSIS — M1990 Unspecified osteoarthritis, unspecified site: Secondary | ICD-10-CM | POA: Diagnosis present

## 2024-06-05 DIAGNOSIS — N3946 Mixed incontinence: Secondary | ICD-10-CM | POA: Diagnosis present

## 2024-06-05 DIAGNOSIS — Z9049 Acquired absence of other specified parts of digestive tract: Secondary | ICD-10-CM

## 2024-06-05 DIAGNOSIS — Z823 Family history of stroke: Secondary | ICD-10-CM

## 2024-06-05 DIAGNOSIS — I25118 Atherosclerotic heart disease of native coronary artery with other forms of angina pectoris: Secondary | ICD-10-CM | POA: Diagnosis not present

## 2024-06-05 LAB — I-STAT CHEM 8, ED
BUN: 28 mg/dL — ABNORMAL HIGH (ref 8–23)
Calcium, Ion: 1.19 mmol/L (ref 1.15–1.40)
Chloride: 107 mmol/L (ref 98–111)
Creatinine, Ser: 1.1 mg/dL — ABNORMAL HIGH (ref 0.44–1.00)
Glucose, Bld: 169 mg/dL — ABNORMAL HIGH (ref 70–99)
HCT: 30 % — ABNORMAL LOW (ref 36.0–46.0)
Hemoglobin: 10.2 g/dL — ABNORMAL LOW (ref 12.0–15.0)
Potassium: 4.1 mmol/L (ref 3.5–5.1)
Sodium: 138 mmol/L (ref 135–145)
TCO2: 19 mmol/L — ABNORMAL LOW (ref 22–32)

## 2024-06-05 LAB — URINALYSIS, ROUTINE W REFLEX MICROSCOPIC
Bilirubin Urine: NEGATIVE
Glucose, UA: NEGATIVE mg/dL
Hgb urine dipstick: NEGATIVE
Ketones, ur: NEGATIVE mg/dL
Leukocytes,Ua: NEGATIVE
Nitrite: NEGATIVE
Protein, ur: NEGATIVE mg/dL
Specific Gravity, Urine: 1.006 (ref 1.005–1.030)
pH: 5 (ref 5.0–8.0)

## 2024-06-05 LAB — BASIC METABOLIC PANEL WITH GFR
BUN/Creatinine Ratio: 25 (ref 12–28)
BUN: 31 mg/dL (ref 10–36)
CO2: 19 mmol/L — ABNORMAL LOW (ref 20–29)
Calcium: 8.8 mg/dL (ref 8.7–10.3)
Chloride: 107 mmol/L — ABNORMAL HIGH (ref 96–106)
Creatinine, Ser: 1.23 mg/dL — ABNORMAL HIGH (ref 0.57–1.00)
Glucose: 112 mg/dL — ABNORMAL HIGH (ref 70–99)
Potassium: 4.6 mmol/L (ref 3.5–5.2)
Sodium: 139 mmol/L (ref 134–144)
eGFR: 41 mL/min/1.73 — ABNORMAL LOW (ref >59)

## 2024-06-05 LAB — CBC WITH DIFFERENTIAL/PLATELET
Abs Immature Granulocytes: 0.16 K/uL — ABNORMAL HIGH (ref 0.00–0.07)
Basophils Absolute: 0.1 K/uL (ref 0.0–0.1)
Basophils Relative: 1 %
Eosinophils Absolute: 0.2 K/uL (ref 0.0–0.5)
Eosinophils Relative: 2 %
HCT: 31.6 % — ABNORMAL LOW (ref 36.0–46.0)
Hemoglobin: 10 g/dL — ABNORMAL LOW (ref 12.0–15.0)
Immature Granulocytes: 1 %
Lymphocytes Relative: 16 %
Lymphs Abs: 1.8 K/uL (ref 0.7–4.0)
MCH: 33 pg (ref 26.0–34.0)
MCHC: 31.6 g/dL (ref 30.0–36.0)
MCV: 104.3 fL — ABNORMAL HIGH (ref 80.0–100.0)
Monocytes Absolute: 0.6 K/uL (ref 0.1–1.0)
Monocytes Relative: 6 %
Neutro Abs: 8.2 K/uL — ABNORMAL HIGH (ref 1.7–7.7)
Neutrophils Relative %: 74 %
Platelets: 444 K/uL — ABNORMAL HIGH (ref 150–400)
RBC: 3.03 MIL/uL — ABNORMAL LOW (ref 3.87–5.11)
RDW: 15.5 % (ref 11.5–15.5)
WBC: 11.1 K/uL — ABNORMAL HIGH (ref 4.0–10.5)
nRBC: 0 % (ref 0.0–0.2)

## 2024-06-05 LAB — COMPREHENSIVE METABOLIC PANEL WITH GFR
ALT: 117 U/L — ABNORMAL HIGH (ref 0–44)
AST: 63 U/L — ABNORMAL HIGH (ref 15–41)
Albumin: 3.2 g/dL — ABNORMAL LOW (ref 3.5–5.0)
Alkaline Phosphatase: 86 U/L (ref 38–126)
Anion gap: 12 (ref 5–15)
BUN: 29 mg/dL — ABNORMAL HIGH (ref 8–23)
CO2: 18 mmol/L — ABNORMAL LOW (ref 22–32)
Calcium: 8.3 mg/dL — ABNORMAL LOW (ref 8.9–10.3)
Chloride: 108 mmol/L (ref 98–111)
Creatinine, Ser: 1.12 mg/dL — ABNORMAL HIGH (ref 0.44–1.00)
GFR, Estimated: 46 mL/min — ABNORMAL LOW (ref >60)
Glucose, Bld: 174 mg/dL — ABNORMAL HIGH (ref 70–99)
Potassium: 4.2 mmol/L (ref 3.5–5.1)
Sodium: 138 mmol/L (ref 135–145)
Total Bilirubin: 0.8 mg/dL (ref 0.0–1.2)
Total Protein: 6.5 g/dL (ref 6.5–8.1)

## 2024-06-05 LAB — I-STAT VENOUS BLOOD GAS, ED
Acid-base deficit: 6 mmol/L — ABNORMAL HIGH (ref 0.0–2.0)
Bicarbonate: 19.3 mmol/L — ABNORMAL LOW (ref 20.0–28.0)
Calcium, Ion: 1.18 mmol/L (ref 1.15–1.40)
HCT: 29 % — ABNORMAL LOW (ref 36.0–46.0)
Hemoglobin: 9.9 g/dL — ABNORMAL LOW (ref 12.0–15.0)
O2 Saturation: 96 %
Potassium: 4.1 mmol/L (ref 3.5–5.1)
Sodium: 138 mmol/L (ref 135–145)
TCO2: 20 mmol/L — ABNORMAL LOW (ref 22–32)
pCO2, Ven: 37.8 mmHg — ABNORMAL LOW (ref 44–60)
pH, Ven: 7.315 (ref 7.25–7.43)
pO2, Ven: 92 mmHg — ABNORMAL HIGH (ref 32–45)

## 2024-06-05 LAB — TROPONIN I (HIGH SENSITIVITY)
Troponin I (High Sensitivity): 325 ng/L (ref <18)
Troponin I (High Sensitivity): 352 ng/L (ref <18)

## 2024-06-05 LAB — CBC
Hematocrit: 27.8 % — ABNORMAL LOW (ref 34.0–46.6)
Hemoglobin: 8.7 g/dL — ABNORMAL LOW (ref 11.1–15.9)
MCH: 31.8 pg (ref 26.6–33.0)
MCHC: 31.3 g/dL — ABNORMAL LOW (ref 31.5–35.7)
MCV: 102 fL — ABNORMAL HIGH (ref 79–97)
Platelets: 393 x10E3/uL (ref 150–450)
RBC: 2.74 x10E6/uL — CL (ref 3.77–5.28)
RDW: 12.8 % (ref 11.7–15.4)
WBC: 9 x10E3/uL (ref 3.4–10.8)

## 2024-06-05 LAB — BRAIN NATRIURETIC PEPTIDE: B Natriuretic Peptide: 2899.1 pg/mL — ABNORMAL HIGH (ref 0.0–100.0)

## 2024-06-05 MED ORDER — ATORVASTATIN CALCIUM 40 MG PO TABS: 80.0000 mg | ORAL_TABLET | Freq: Every day | ORAL | Status: DC

## 2024-06-05 MED ORDER — IPRATROPIUM BROMIDE 0.02 % IN SOLN
1.5000 mg | Freq: Once | RESPIRATORY_TRACT | Status: AC
  Administered 2024-06-05: 1.5 mg via RESPIRATORY_TRACT
  Filled 2024-06-05: qty 7.5

## 2024-06-05 MED ORDER — ASPIRIN 81 MG PO TBEC: 81.0000 mg | DELAYED_RELEASE_TABLET | Freq: Every day | ORAL | Status: DC

## 2024-06-05 MED ORDER — EZETIMIBE 10 MG PO TABS: 10.0000 mg | ORAL_TABLET | Freq: Every day | ORAL | Status: DC

## 2024-06-05 MED ORDER — MELATONIN 5 MG PO TABS: 5.0000 mg | ORAL_TABLET | Freq: Every evening | ORAL | Status: DC | PRN

## 2024-06-05 MED ORDER — ALPRAZOLAM 0.25 MG PO TABS: 0.2500 mg | ORAL_TABLET | Freq: Two times a day (BID) | ORAL | Status: DC | PRN

## 2024-06-05 MED ORDER — ENOXAPARIN SODIUM 40 MG/0.4ML IJ SOSY: 40.0000 mg | PREFILLED_SYRINGE | INTRAMUSCULAR | Status: DC

## 2024-06-05 MED ORDER — CLOPIDOGREL BISULFATE 75 MG PO TABS
75.0000 mg | ORAL_TABLET | Freq: Every day | ORAL | Status: DC
  Administered 2024-06-06: 75 mg via ORAL
  Filled 2024-06-05: qty 1

## 2024-06-05 MED ORDER — ACETAMINOPHEN 325 MG PO TABS: 650.0000 mg | ORAL_TABLET | Freq: Four times a day (QID) | ORAL | Status: DC | PRN

## 2024-06-05 MED ORDER — NITROGLYCERIN IN D5W 200-5 MCG/ML-% IV SOLN
0.0000 ug/min | INTRAVENOUS | Status: DC
  Administered 2024-06-05: 5 ug/min via INTRAVENOUS
  Filled 2024-06-05: qty 250

## 2024-06-05 MED ORDER — POLYETHYLENE GLYCOL 3350 17 G PO PACK: 17.0000 g | PACK | Freq: Every day | ORAL | Status: DC | PRN

## 2024-06-05 MED ORDER — FUROSEMIDE 10 MG/ML IJ SOLN
40.0000 mg | Freq: Once | INTRAMUSCULAR | Status: AC
  Administered 2024-06-05: 40 mg via INTRAVENOUS
  Filled 2024-06-05: qty 4

## 2024-06-05 MED ORDER — SENNOSIDES-DOCUSATE SODIUM 8.6-50 MG PO TABS: 2.0000 | ORAL_TABLET | Freq: Every day | ORAL | Status: DC

## 2024-06-05 MED ORDER — ALBUTEROL SULFATE (2.5 MG/3ML) 0.083% IN NEBU
20.0000 mg/h | INHALATION_SOLUTION | Freq: Once | RESPIRATORY_TRACT | Status: AC
  Administered 2024-06-05: 20 mg/h via RESPIRATORY_TRACT
  Filled 2024-06-05: qty 24

## 2024-06-05 MED ORDER — PROCHLORPERAZINE EDISYLATE 10 MG/2ML IJ SOLN: 5.0000 mg | Freq: Four times a day (QID) | INTRAMUSCULAR | Status: DC | PRN

## 2024-06-05 NOTE — ED Triage Notes (Signed)
 PT brought in by [facility], PT called out for resp distress. PT was noted to have low sats and placed on cpap by ems, PT was 96% on cpap, given 2 duonebs en route and 2G of Mag en route. PT is to be placed on hospice tomorrow noted to take 2 xanax  prior to ems arrival. PT placed on cpap in ED.

## 2024-06-05 NOTE — ED Provider Notes (Signed)
 Angel [name]   CSN: [id number] Arrival date & time: 06/05/24  1940     Patient presents with: Respiratory Distress   Angel [name] is a 89 y.o. female history of NSTEMI, here presenting with shortness of breath.  Patient had a NSTEMI 2 weeks ago.  Patient had 2 attempts at cath and revascularization was unsuccessful.  Patient was managed medically with IV heparin and IV nitroglycerin .  Patient's troponin peaked at 467.  Patient follow-up with cardiology yesterday was thought to have some volume overload.  Patient was prescribed Lasix 20 mg but did not take it yet.  Around 3 PM, Angel [name] had sudden onset of shortness of breath.  Angel [name] took 3 nitros and 2 Xanax  and still short of breath so EMS was called. Patient was thought to have some wheezing so was given albuterol  and magnesium and put on CPAP.  Patient is DNR.  Patient in fact has a hospice appointment tomorrow for intake but is not yet a hospice patient.   The history is provided by the patient.       Prior to Admission medications   Medication Sig Start Date End Date Taking? Authorizing Provider  acetaminophen  (TYLENOL ) 325 MG tablet Take 2 tablets (650 mg total) by mouth every 4 (four) hours as needed for headache or mild pain (pain score 1-3). 05/21/24   Wieting, Richard, MD  ALPRAZolam  (XANAX ) 0.25 MG tablet TAKE 1 TABLET BY MOUTH 2 TIMES DAILY AS NEEDED. Patient taking differently: Take 0.25 mg by mouth 2 (two) times daily as needed for sleep or anxiety. 05/04/23   Henson, [name] L, NP-C  ascorbic acid (VITAMIN C) 500 MG tablet Take 500 mg by mouth daily.    [provider]  aspirin  EC 81 MG EC tablet Take 1 tablet (81 mg total) by mouth daily. Swallow whole. 07/01/20   Vann, Jessica U, DO  atorvastatin  (LIPITOR) 80 MG tablet Take 1 tablet (80 mg total) by mouth daily. 06/04/24   Lorene Lesley CROME, PA-C  busPIRone  (BUSPAR ) 10 MG tablet TAKE 1 TABLET BY MOUTH TWICE A DAY  05/21/24   Henson, [name] L, NP-C  clopidogrel  (PLAVIX ) 75 MG tablet Take 1 tablet (75 mg total) by mouth daily. 05/26/24   Henry Manuelita NOVAK, NP  ezetimibe (ZETIA) 10 MG tablet Take 1 tablet (10 mg total) by mouth daily. 05/26/24   Henry Manuelita NOVAK, NP  furosemide (LASIX) 20 MG tablet Take 1 tablet (20 mg total) by mouth daily. 06/04/24 09/02/24  Lorene Lesley CROME, PA-C  isosorbide mononitrate (IMDUR) 60 MG 24 hr tablet Take 1 tablet (60 mg total) by mouth daily. 05/26/24   Henry Manuelita NOVAK, NP  metoprolol  succinate (TOPROL -XL) 50 MG 24 hr tablet Take 1 tablet (50 mg total) by mouth 2 (two) times daily. Take with or immediately following a meal. 05/26/24   Henry Manuelita NOVAK, NP  Multiple Vitamin (MULTIVITAMIN WITH MINERALS) TABS tablet Take 1 tablet by mouth daily.    [provider]  nitroGLYCERIN  (NITROSTAT ) 0.4 MG SL tablet Place 1 tablet (0.4 mg total) under the tongue every 5 (five) minutes as needed for chest pain. 05/27/24 08/25/24  End, Lonni, MD  ranolazine (RANEXA) 500 MG 12 hr tablet Take 1 tablet (500 mg total) by mouth 2 (two) times daily. 05/26/24   Henry Manuelita NOVAK, NP  vitamin B-12 (CYANOCOBALAMIN) 100 MCG tablet Take 1,000 mcg by mouth daily.     [provider]  Allergies: Codeine, Levofloxacin, Mirabegron , Prednisone, Sulfa antibiotics, and Duloxetine     Review of Systems  Respiratory:  Positive for shortness of breath.   All other systems reviewed and are negative.   Updated Vital Signs BP (!) 156/74   Pulse (!) 102   Temp (!) 97.3 F (36.3 C) (Axillary)   Resp (!) 24   Ht 5' 3 (1.6 m)   Wt 83.9 kg   LMP  (LMP Unknown)   SpO2 100%   BMI 32.77 kg/m   Physical Exam Vitals and nursing [name] reviewed.  Constitutional:      Comments: Chronically ill and tachypneic  HENT:     Head: Normocephalic.     Nose: Nose normal.     Mouth/Throat:     Mouth: Mucous membranes are moist.  Eyes:     Extraocular Movements: Extraocular movements  intact.     Pupils: Pupils are equal, round, and reactive to light.  Cardiovascular:     Rate and Rhythm: Normal rate and regular rhythm.  Pulmonary:     Comments: Crackles bilateral bases and questionable wheezing throughout Abdominal:     General: Abdomen is flat.     Palpations: Abdomen is soft.  Musculoskeletal:     Cervical back: Normal range of motion and neck supple.     Comments: 2+ edema bilateral legs   Skin:    General: Skin is warm.     Capillary Refill: Capillary refill takes less than 2 seconds.  Neurological:     General: No focal deficit present.     Mental Status: Angel [name] is oriented to person, place, and time.  Psychiatric:        Mood and Affect: Mood normal.        Behavior: Behavior normal.     (all labs ordered are listed, but only abnormal results are displayed) Labs Reviewed  CBC WITH DIFFERENTIAL/PLATELET - Abnormal; Notable for the following components:      Result Value   WBC 11.1 (*)    RBC 3.03 (*)    Hemoglobin 10.0 (*)    HCT 31.6 (*)    MCV 104.3 (*)    Platelets 444 (*)    Neutro Abs 8.2 (*)    Abs Immature Granulocytes 0.16 (*)    All other components within normal limits  I-STAT CHEM 8, ED - Abnormal; Notable for the following components:   BUN 28 (*)    Creatinine, Ser 1.10 (*)    Glucose, Bld 169 (*)    TCO2 19 (*)    Hemoglobin 10.2 (*)    HCT 30.0 (*)    All other components within normal limits  I-STAT VENOUS BLOOD GAS, ED - Abnormal; Notable for the following components:   pCO2, Ven 37.8 (*)    pO2, Ven 92 (*)    Bicarbonate 19.3 (*)    TCO2 20 (*)    Acid-base deficit 6.0 (*)    HCT 29.0 (*)    Hemoglobin 9.9 (*)    All other components within normal limits  COMPREHENSIVE METABOLIC PANEL WITH GFR  BRAIN NATRIURETIC PEPTIDE  TROPONIN I (HIGH SENSITIVITY)    EKG: EKG Interpretation Date/Time:  Wednesday June 05 2024 19:48:51 EDT Ventricular Rate:  104 PR Interval:  172 QRS Duration:  111 QT Interval:  347 QTC  Calculation: 457 R Axis:   15  Text Interpretation: Sinus tachycardia Anterior infarct, old No significant change since last tracing Confirmed by Patt Alm DEL [phone]) on 06/05/2024 7:50:20 PM  Radiology:  DG Chest Port 1 View Result Date: 06/05/2024 CLINICAL DATA:  Shortness of breath. EXAM: PORTABLE CHEST 1 VIEW COMPARISON:  Chest radiograph dated 05/20/2024. FINDINGS: There is cardiomegaly with vascular congestion and edema new since the prior radiograph. Pneumonia is not excluded. No pleural effusion pneumothorax. Atherosclerotic calcification of the aorta. No acute osseous pathology. IMPRESSION: Cardiomegaly with vascular congestion and edema. Pneumonia is not excluded. Electronically Signed   By: Vanetta Chou M.D.   On: 06/05/2024 20:17     Procedures    CRITICAL CARE Performed by: Alm VEAR Cave   Total critical care time: 39 minutes  Critical care time was exclusive of separately billable procedures and treating other patients.  Critical care was necessary to treat or prevent imminent or life-threatening deterioration.  Critical care was time spent personally by me on the following activities: development of treatment plan with patient and/or surrogate as well as nursing, discussions with consultants, evaluation of patient's response to treatment, examination of patient, obtaining history from patient or surrogate, ordering and performing treatments and interventions, ordering and review of laboratory studies, ordering and review of radiographic studies, pulse oximetry and re-evaluation of patient's condition.  Medications Ordered in the ED  albuterol  (PROVENTIL ) (2.5 MG/3ML) 0.083% nebulizer solution (20 mg/hr Nebulization Given 06/05/24 2004)  ipratropium (ATROVENT) nebulizer solution 1.5 mg (1.5 mg Nebulization Given 06/05/24 2004)  furosemide (LASIX) injection 40 mg (40 mg Intravenous Given 06/05/24 2010)                                    Medical Decision  Making LURLENE RONDA is a 89 y.o. female here presenting with shortness of breath.  Patient recently had his NSTEMI.  Concern for flash pulmonary edema.  Also consider bronchitis.  Plan to get CBC CMP and BNP and chest x-ray and VBG.  Patient was on CPAP so we will continue CPAP give continuous nebs.  I had a goals of care with discussion with her daughter who is her power of attorney.  Angel [name] verified that patient is DNR.  Patient is not on full hospice yet and they would want medical intervention but not intubation or chest compressions.   9:50 PM I reviewed patient's labs and Trop is elevated at 325.  It was 560 before discharge.  BNP is elevated at 2889.  Patient was started on nitro drip and given Lasix.  Patient did not tolerate CPAP and now is on high flow nasal cannula.  Cardiology was consulted.   10:17 PM Patient will be admitted by hospitalist for pulmonary edema  Problems Addressed: Acute pulmonary edema (HCC): acute illness or injury  Amount and/or Complexity of Data Reviewed Labs: ordered. Decision-making details documented in ED Course. Radiology: ordered and independent interpretation performed. Decision-making details documented in ED Course. ECG/medicine tests: ordered and independent interpretation performed. Decision-making details documented in ED Course.  Risk Prescription drug management. Decision regarding hospitalization.     Final diagnoses:  None    ED Discharge Orders     None          Cave Alm Macho, MD 06/05/24 2218

## 2024-06-05 NOTE — Consult Note (Signed)
 Cardiology Consultation   Patient ID: Angel [name] MRN: [id number]; DOB: 03-01-1931  Admit date: 06/05/2024 Date of Consult: 06/05/2024  PCP:  Angel Boby CROME, NP-C   [facility] Providers Cardiologist:  Lonni Hanson, MD        Patient Profile: Angel [name] is a 89 y.o. female with a hx of recent NSTEMI who is being seen 06/05/2024 for the evaluation of acute on chronic diastolic HF at the request of ED.  History of Present Illness: Angel [name] had a NSTEMI in early October, cath showed CTO of Cx and heavily calcified ostial RCA 99% lesion with LVEDP 20.  Attempts to PCI RCA were unsuccessful so medical management of her CAD was optimized with BB, nitrates, and ranolazine.  Lisinopril  discontinued to make BP room.  Echo showed preserved EF with moderate AS.  Seen in clinic yesterday and noted to be volume overloaded, prescribed lasix but not yet started.  Plan was to be admitted to hospice tomorrow.  DNR  Presented to ED today with acute SOB, likely flash pulmonary edema.  Initially required Bipap, but improved with nitroglycerin  infusion at 5 and lasix IV. Perhaps some partial symptom relief from inhalers.    BNP 2900, troponin 325 (lower than 500 last admission). Venous pH 7.31 Cr 1.1 at baseline   Past Medical History:  Diagnosis Date   Acute CVA (cerebrovascular accident) (HCC) 06/28/2020   Acute on chronic congestive heart failure (HCC) 06/05/2024   Anemia 05/26/2024   Anxiety    Aortic aneurysm without rupture    Aortic stenosis    mild-moderate by echo   Arthritis    CAD (coronary artery disease)    Chronic hip pain    Endometriosis    Hearing loss    HTN, goal below 150/90    Hyperlipidemia LDL goal <70 11/18/2014   NSTEMI (non-ST elevated myocardial infarction) (HCC) 05/21/2024   Osteopenia of the elderly    Urge and stress incontinence     Past Surgical History:  Procedure Laterality Date   ABDOMINAL HYSTERECTOMY  1976   total    APPENDECTOMY     BREAST EXCISIONAL BIOPSY Left 1987   benign   CATARACT EXTRACTION, BILATERAL  08/2018   Dr. Maree with [facility]   CORONARY ANGIOGRAPHY N/A 05/23/2024   Procedure: CORONARY ANGIOGRAPHY;  Surgeon: Verlin Lonni BIRCH, MD;  Location: [facility] INVASIVE CV LAB;  Service: Cardiovascular;  Laterality: N/A;   RIGHT/LEFT HEART CATH AND CORONARY ANGIOGRAPHY N/A 05/21/2024   Procedure: RIGHT/LEFT HEART CATH AND CORONARY ANGIOGRAPHY;  Surgeon: Hanson Lonni, MD;  Location: [facility] INVASIVE CV LAB;  Service: Cardiovascular;  Laterality: N/A;     Medications: reconciled  Allergies:    Allergies  Allergen Reactions   Codeine Other (See Comments)    zoned out   Levofloxacin Other (See Comments)    AMS   Mirabegron  Other (See Comments)    Severe depression   Prednisone Other (See Comments)    AMS   Sulfa Antibiotics Other (See Comments)    AMS   Duloxetine  Other (See Comments)    Caused patient more anxiety     Social History:   Social History   Socioeconomic History   Marital status: Significant Other    Spouse name: Not on file   Number of children: 3   Years of education: Not on file   Highest education level: Master's degree (e.g., MA, MS, MEng, MEd, MSW, MBA)  Occupational History   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  Tobacco Use   Smoking status: Never   Smokeless tobacco: Never  Vaping Use   Vaping status: Never Used  Substance and Sexual Activity   Alcohol use: Yes    Alcohol/week: 1.0 standard drink of alcohol    Types: 1 Cans of beer per week    Comment: once a month   Drug use: No   Sexual activity: Not on file  Other Topics Concern   Not on file  Social History Narrative   Not on file   Social Drivers of Health   Financial Resource Strain: Low Risk  (06/22/2023)   Overall Financial Resource Strain (CARDIA)    Difficulty of Paying Living Expenses: Not very hard  Food Insecurity: No Food Insecurity (05/22/2024)   Hunger Vital  Sign    Worried About Running Out of Food in the Last Year: Never true    Ran Out of Food in the Last Year: Never true  Transportation Needs: No Transportation Needs (05/22/2024)   PRAPARE - Administrator, Civil Service (Medical): No    Lack of Transportation (Non-Medical): No  Physical Activity: Inactive (06/22/2023)   Exercise Vital Sign    Days of Exercise per Week: 0 days    Minutes of Exercise per Session: 0 min  Stress: No Stress Concern Present (06/22/2023)   [employer] of Occupational Health - Occupational Stress Questionnaire    Feeling of Stress : Only a little  Social Connections: Unknown (05/22/2024)   Social Connection and Isolation Panel    Frequency of Communication with Friends and Family: Patient declined    Frequency of Social Gatherings with Friends and Family: Not on file    Attends Religious Services: Patient declined    Active Member of Clubs or Organizations: Patient declined    Attends Banker Meetings: Patient declined    Marital Status: Patient declined  Intimate Partner Violence: Not At Risk (05/22/2024)   Humiliation, Afraid, Rape, and Kick questionnaire    Fear of Current or Ex-Partner: No    Emotionally Abused: No    Physically Abused: No    Sexually Abused: No    Family History:   Family History  Problem Relation Age of Onset   Stroke Mother    Cancer Brother        skin   Breast cancer Maternal Grandmother 60   Heart disease Maternal Grandmother      ROS:  Please see the history of present illness.  All other ROS reviewed and negative.     Physical Exam/Data: Vitals:   06/05/24 2030 06/05/24 2045 06/05/24 2130 06/05/24 2145  BP: (!) 142/81 (!) 158/87 118/63 119/68  Pulse: (!) 102 (!) 110 (!) 107 (!) 107  Resp: (!) 28  (!) 26 (!) 22  Temp:      TempSrc:      SpO2: 98% 98% 97% 95%  Weight:      Height:          06/05/2024    7:46 PM 06/04/2024    2:30 PM 05/23/2024    7:00 AM  Last 3 Weights   Weight (lbs) 185 lb 185 lb 9.6 oz 185 lb 10 oz  Weight (kg) 83.915 kg 84.188 kg 84.2 kg     Body mass index is 32.77 kg/m.  General:  Well nourished, well developed, in no acute distresscurrently, frail HEENT: normal Neck: JVD to angle of jaw, +HJR Vascular: Distal pulses 2+ bilaterally Significant bruising around R femoral cath site, no  femoral bruits appreciated. Cardiac:  normal S1, S2; tachycardic with a systolic ejection murmur  Lungs:  quiet with some crackles at both bases Abd: soft, nontender, bowel sounds present Ext: 3+ edema Skin: warm  EKG:  The EKG was personally reviewed and demonstrates:  sinus tachy.  Event labeled as VT on telemetry was in fact artifact.  No major ischemic changes noted on 12 lead   Laboratory Data: High Sensitivity Troponin:   Recent Labs  Lab 05/20/24 2047 05/21/24 0019 05/21/24 0646 06/05/24 1951  TROPONINIHS 21* 467* 562* 325*     Chemistry Recent Labs  Lab 06/04/24 1555 06/05/24 1951 06/05/24 2000 06/05/24 2006  NA 139 138 138 138  K 4.6 4.2 4.1 4.1  CL 107* 108 107  --   CO2 19* 18*  --   --   GLUCOSE 112* 174* 169*  --   BUN 31 29* 28*  --   CREATININE 1.23* 1.12* 1.10*  --   CALCIUM  8.8 8.3*  --   --   GFRNONAA  --  46*  --   --   ANIONGAP  --  12  --   --     Recent Labs  Lab 06/05/24 1951  PROT 6.5  ALBUMIN 3.2*  AST 63*  ALT 117*  ALKPHOS 86  BILITOT 0.8   Hematology Recent Labs  Lab 06/04/24 1555 06/05/24 1951 06/05/24 2000 06/05/24 2006  WBC 9.0 11.1*  --   --   RBC 2.74* 3.03*  --   --   HGB 8.7* 10.0* 10.2* 9.9*  HCT 27.8* 31.6* 30.0* 29.0*  MCV 102* 104.3*  --   --   MCH 31.8 33.0  --   --   MCHC 31.3* 31.6  --   --   RDW 12.8 15.5  --   --   PLT 393 444*  --   --    Lipids No results for input(s): CHOL, TRIG, HDL, LABVLDL, LDLCALC, CHOLHDL in the last 168 hours.  Thyroid  No results for input(s): TSH, FREET4 in the last 168 hours.  BNP Recent Labs  Lab 06/05/24 1951  BNP  2,899.1*    DDimer No results for input(s): DDIMER in the last 168 hours.  Radiology/Studies:  DG Chest Port 1 View Result Date: 06/05/2024 CLINICAL DATA:  Shortness of breath. EXAM: PORTABLE CHEST 1 VIEW COMPARISON:  Chest radiograph dated 05/20/2024. FINDINGS: There is cardiomegaly with vascular congestion and edema new since the prior radiograph. Pneumonia is not excluded. No pleural effusion pneumothorax. Atherosclerotic calcification of the aorta. No acute osseous pathology. IMPRESSION: Cardiomegaly with vascular congestion and edema. Pneumonia is not excluded. Electronically Signed   By: Vanetta Chou M.D.   On: 06/05/2024 20:17     Assessment and Plan: Acute on chronic diastolic heart failure in setting of recent NSTEMI and 2 vessel non-intervenable CAD.  Symptoms currently do not indicate recurrent coronary ischemia.  No heparin needed Agree with nitroglycerin  infusion at low dose to help with pulmonary congestion and manage acute SOB.  Hold home Imdur for tonight Recommend lasix 40 IV bid for now to facilitate at least 1-2L negative diuresis daily with close attention to electrolytes and renal function.  She has mod AS, so would not be more aggressive than this in volume removal. From medication standpoint, ok to continue BB (as this is decompensated diastolic, not systolic heart failure) which will help with diastolic relaxation, enhance diastolic filling time, and prevent ischemia Continue other CAD meds including ASA, clopidogel, ranolazine, statin,  zetia. Discussed with her daughter in law (who is a gaffer at [employer]) and confirmed she is DNAR.  Also confirmed that there is no appetite for inotropic therapy should she worsen from a cardiac perspective as these have questionable utility in diastolic dysfunction and she is not really a candidate for advanced heart failure therapies.     Risk Assessment/Risk Scores:       [place]  Heart Association (NYHA) Functional  Class NYHA Class III       For questions or updates, please contact [facility] Please consult [email or web address] for contact info under     Signed, Andee Flatten, MD  06/05/2024 9:59 PM   Addendum: made aware of slightly uptrending troponin.  I still do not have strong suspicion of primary ACS, this may be demand ischemia given known unrevascularized CAD and current distress.  No acute indication to heparinize.  Trend troponin to peak.  It is unlikely a repeat cath will have any utility.

## 2024-06-05 NOTE — Progress Notes (Signed)
 BiPAP held at this time per MD, started CAT 20mg  albuterol  & 1.5mg  atrovent via aerosol mask. SERVO on standby in room if needed.

## 2024-06-05 NOTE — H&P (Incomplete)
 History and Physical  Angel [name] FMW:[id number] DOB: [birth year] DOA: 06/05/2024  Referring physician: Dr. Patt, EDP  PCP: Lendia Boby CROME, NP-C  Outpatient Specialists: Cardiology Patient coming from: Home  Chief Complaint: Shortness of breath   HPI: Angel [name] is a 89 y.o. female with medical history significant for moderate to severe aortic stenosis, hypertension, hyperlipidemia, history of stroke, coronary artery disease, chronic HFpEF, CKD 3B, anemia of chronic disease, who presents to the ER via EMS from home due to shortness of breath.  Upon EMS arrival, the patient was found to be in respiratory distress and was placed on CPAP by EMS.  She received 2 DuoNebs and route, 2 g of IV magnesium.  Reportedly there is plan for placement to hospice tomorrow 06/06/2024.  The patient was recently admitted to the hospital for NSTEMI she had a heart cath however cardiology was unable to place a stent and recommended medical management.  In the ER, tachycardic and tachypneic.  Initially placed on BiPAP which was later weaned off to high flow nasal cannula, 8 L.  A chest x-ray revealed cardiomegaly and pulmonary edema.  BNP greater than 2800.  Troponin 325.  The patient received IV Lasix 40 mg x 1 and was started on nitro drip.  [facility], hospitalist service, was asked to admit.  Cardiology consulted by EDP.  ED Course: Temperature 97.3.  BP 156/74, pulse 102, respiratory 24, O2 saturation 96% on BiPAP with FiO2 of 60%.  Review of Systems: Review of systems as noted in the HPI. All other systems reviewed and are negative.   Past Medical History:  Diagnosis Date   Acute CVA (cerebrovascular accident) (HCC) 06/28/2020   Acute on chronic congestive heart failure (HCC) 06/05/2024   Anemia 05/26/2024   Anxiety    Aortic aneurysm without rupture    Aortic stenosis    mild-moderate by echo   Arthritis    CAD (coronary artery disease)    Chronic hip pain    Endometriosis    Hearing loss     HTN, goal below 150/90    Hyperlipidemia LDL goal <70 11/18/2014   NSTEMI (non-ST elevated myocardial infarction) (HCC) 05/21/2024   Osteopenia of the elderly    Urge and stress incontinence    Past Surgical History:  Procedure Laterality Date   ABDOMINAL HYSTERECTOMY  1976   total   APPENDECTOMY     BREAST EXCISIONAL BIOPSY Left 1987   benign   CATARACT EXTRACTION, BILATERAL  08/2018   Dr. Maree with [facility]   CORONARY ANGIOGRAPHY N/A 05/23/2024   Procedure: CORONARY ANGIOGRAPHY;  Surgeon: Verlin Lonni BIRCH, MD;  Location: [facility] IN[facility]ASIVE [facility];  Service: Cardiovascular;  Laterality: N/A;   RIGHT/LEFT HEART CATH AND CORONARY ANGIOGRAPHY N/A 05/21/2024   Procedure: RIGHT/LEFT HEART CATH AND CORONARY ANGIOGRAPHY;  Surgeon: Mady Lonni, MD;  Location: [facility] INVASIVE CV LAB;  Service: Cardiovascular;  Laterality: N/A;    Social History:  reports that she has never smoked. She has never used smokeless tobacco. She reports current alcohol use of about 1.0 standard drink of alcohol per week. She reports that she does not use drugs.   Allergies  Allergen Reactions   Codeine Other (See Comments)    zoned out   Levofloxacin Other (See Comments)    [facility]   Mirabegron  Other (See Comments)    Severe depression   Prednisone Other (See Comments)    [facility]   Sulfa Antibiotics Other (See Comments)    [facility]   Duloxetine   Other (See Comments)    Caused patient more anxiety     Family History  Problem Relation Age of Onset   Stroke Mother    Cancer Brother        skin   Breast cancer Maternal Grandmother 60   Heart disease Maternal Grandmother       Prior to Admission medications   Medication Sig Start Date End Date Taking? Authorizing Provider  acetaminophen  (TYLENOL ) 325 MG tablet Take 2 tablets (650 mg total) by mouth every 4 (four) hours as needed for headache or mild pain (pain score 1-3). 05/21/24   Wieting, Richard, MD  ALPRAZolam  (XANAX ) 0.25 MG tablet TAKE 1  TABLET BY MOUTH 2 TIMES DAILY AS NEEDED. Patient taking differently: Take 0.25 mg by mouth 2 (two) times daily as needed for sleep or anxiety. 05/04/23   Henson, [name] L, NP-C  ascorbic acid (VITAMIN C) 500 MG tablet Take 500 mg by mouth daily.    [provider]  aspirin  EC 81 MG EC tablet Take 1 tablet (81 mg total) by mouth daily. Swallow whole. 07/01/20   Vann, Jessica U, DO  atorvastatin  (LIPITOR) 80 MG tablet Take 1 tablet (80 mg total) by mouth daily. 06/04/24   Lorene Lesley CROME, PA-C  busPIRone  (BUSPAR ) 10 MG tablet TAKE 1 TABLET BY MOUTH TWICE A DAY 05/21/24   Henson, [name] L, NP-C  clopidogrel  (PLAVIX ) 75 MG tablet Take 1 tablet (75 mg total) by mouth daily. 05/26/24   Henry Manuelita NOVAK, NP  ezetimibe (ZETIA) 10 MG tablet Take 1 tablet (10 mg total) by mouth daily. 05/26/24   Henry Manuelita NOVAK, NP  furosemide (LASIX) 20 MG tablet Take 1 tablet (20 mg total) by mouth daily. 06/04/24 09/02/24  Lorene Lesley CROME, PA-C  isosorbide mononitrate (IMDUR) 60 MG 24 hr tablet Take 1 tablet (60 mg total) by mouth daily. 05/26/24   Henry Manuelita NOVAK, NP  metoprolol  succinate (TOPROL -XL) 50 MG 24 hr tablet Take 1 tablet (50 mg total) by mouth 2 (two) times daily. Take with or immediately following a meal. 05/26/24   Henry Manuelita NOVAK, NP  Multiple Vitamin (MULTIVITAMIN WITH MINERALS) TABS tablet Take 1 tablet by mouth daily.    [provider]  nitroGLYCERIN  (NITROSTAT ) 0.4 MG SL tablet Place 1 tablet (0.4 mg total) under the tongue every 5 (five) minutes as needed for chest pain. 05/27/24 08/25/24  End, Lonni, MD  ranolazine (RANEXA) 500 MG 12 hr tablet Take 1 tablet (500 mg total) by mouth 2 (two) times daily. 05/26/24   Henry Manuelita NOVAK, NP  vitamin B-12 (CYANOCOBALAMIN) 100 MCG tablet Take 1,000 mcg by mouth daily.     [provider]    Physical Exam: BP 119/68   Pulse (!) 107   Temp (!) 97.3 F (36.3 C) (Axillary)   Resp (!) 22   Ht 5' 3 (1.6 m)   Wt 83.9  kg   LMP  (LMP Unknown)   SpO2 95%   BMI 32.77 kg/m   General: 89 y.o. year-old female well developed well nourished in no acute distress.  Alert and oriented x3. Cardiovascular: Regular rate and rhythm with no rubs or gallops.  No thyromegaly or JVD noted.  No lower extremity edema. 2/4 pulses in all 4 extremities. Respiratory: Diffuse rales bilaterally with poor inspiratory effort. Abdomen: Soft nontender nondistended with normal bowel sounds x4 quadrants. Muskuloskeletal: No cyanosis, clubbing or edema noted bilaterally Neuro: CN II-XII intact, strength, sensation, reflexes Skin: No ulcerative lesions noted  or rashes Psychiatry: Judgement and insight appear normal. Mood is appropriate for condition and setting          Labs on Admission:  Basic Metabolic Panel: Recent Labs  Lab 06/04/24 1555 06/05/24 1951 06/05/24 2000 06/05/24 2006  NA 139 138 138 138  K 4.6 4.2 4.1 4.1  CL 107* 108 107  --   CO2 19* 18*  --   --   GLUCOSE 112* 174* 169*  --   BUN 31 29* 28*  --   CREATININE 1.23* 1.12* 1.10*  --   CALCIUM  8.8 8.3*  --   --    Liver Function Tests: Recent Labs  Lab 06/05/24 1951  AST 63*  ALT 117*  ALKPHOS 86  BILITOT 0.8  PROT 6.5  ALBUMIN 3.2*   No results for input(s): LIPASE, AMYLASE in the last 168 hours. No results for input(s): AMMONIA in the last 168 hours. CBC: Recent Labs  Lab 06/04/24 1555 06/05/24 1951 06/05/24 2000 06/05/24 2006  WBC 9.0 11.1*  --   --   NEUTROABS  --  8.2*  --   --   HGB 8.7* 10.0* 10.2* 9.9*  HCT 27.8* 31.6* 30.0* 29.0*  MCV 102* 104.3*  --   --   PLT 393 444*  --   --    Cardiac Enzymes: No results for input(s): CKTOTAL, CKMB, CKMBINDEX, TROPONINI in the last 168 hours.  BNP (last 3 results) Recent Labs    06/05/24 1951  BNP 2,899.1*    ProBNP (last 3 results) No results for input(s): PROBNP in the last 8760 hours.  CBG: No results for input(s): GLUCAP in the last 168  hours.  Radiological Exams on Admission: DG Chest Port 1 View Result Date: 06/05/2024 CLINICAL DATA:  Shortness of breath. EXAM: PORTABLE CHEST 1 VIEW COMPARISON:  Chest radiograph dated 05/20/2024. FINDINGS: There is cardiomegaly with vascular congestion and edema new since the prior radiograph. Pneumonia is not excluded. No pleural effusion pneumothorax. Atherosclerotic calcification of the aorta. No acute osseous pathology. IMPRESSION: Cardiomegaly with vascular congestion and edema. Pneumonia is not excluded. Electronically Signed   By: Vanetta Chou M.D.   On: 06/05/2024 20:17    EKG: I independently viewed the EKG done and my findings are as followed: Sinus tachycardia rate of 106.  Nonspecific ST-T changes.  QTc 480.  Assessment/Plan Present on Admission: **None**  Principal Problem:   Acute on chronic congestive heart failure (HCC)  Acute on chronic diastolic CHF Presented with BNP greater than 2800, cardiomegaly and pulmonary edema seen on chest x-ray, respiratory distress. Last 2D echo done on 05/21/2024 revealed LVEF 55% with grade 1 diastolic dysfunction.  The left ventricle demonstrated regional wall motion abnormalities, mild inferior lateral and lateral wall hypokinesis. Received IV Lasix in the ER, 40 mg x 1 Seen by cardiology. Start strict I's and O's and daily weight.  Coronary artery disease status post left heart cath Cardiology was unable to place a stent recommended medical management Resume home DAPT, Lipitor, and Zetia. Closely monitor on telemetry.  Elevated troponin, recent heart cath No evidence of acute ischemia on twelve-lead EKG. Management per cardiology Monitor on telemetry.  Prediabetes with hyperglycemia Presented with serum glucose of 174 Last hemoglobin A1c 6.3 on 11/29/2023 Diet controlled  Goals of care DNR Palliative care medicine consulted to assist with goals of care discussions.  Anxiety Resume home Xanax  as needed   Critical  care time: 55 minutes.   DVT prophylaxis: Subcu Lovenox  daily.  Code  Status: DNR.  Family Communication: None at bedside.  Disposition Plan: Admitted to progressive care unit.  Consults called: Cardiology.  Admission status: Inpatient status.   Status is: Inpatient The patient requires at least 2 midnights for further evaluation and treatment of present condition.   Terry LOISE Hurst MD [facility] Pager [phone]  If 7PM-7AM, please contact night-coverage [email or web address] Password [email or web address]  06/05/2024, 10:00 PM

## 2024-06-05 NOTE — ED Notes (Signed)
 Family states pt has been taking Colace for constipation, requests Colace instead of Miralax, secure chat sent to hospitalist

## 2024-06-06 ENCOUNTER — Other Ambulatory Visit (HOSPITAL_COMMUNITY): Payer: Self-pay

## 2024-06-06 DIAGNOSIS — J810 Acute pulmonary edema: Secondary | ICD-10-CM | POA: Diagnosis not present

## 2024-06-06 DIAGNOSIS — I25118 Atherosclerotic heart disease of native coronary artery with other forms of angina pectoris: Secondary | ICD-10-CM

## 2024-06-06 DIAGNOSIS — I5033 Acute on chronic diastolic (congestive) heart failure: Secondary | ICD-10-CM

## 2024-06-06 LAB — MAGNESIUM: Magnesium: 2.3 mg/dL (ref 1.7–2.4)

## 2024-06-06 LAB — CBC WITH DIFFERENTIAL/PLATELET
Abs Immature Granulocytes: 0.08 K/uL — ABNORMAL HIGH (ref 0.00–0.07)
Basophils Absolute: 0.1 K/uL (ref 0.0–0.1)
Basophils Relative: 1 %
Eosinophils Absolute: 0.2 K/uL (ref 0.0–0.5)
Eosinophils Relative: 2 %
HCT: 26.3 % — ABNORMAL LOW (ref 36.0–46.0)
Hemoglobin: 8.5 g/dL — ABNORMAL LOW (ref 12.0–15.0)
Immature Granulocytes: 1 %
Lymphocytes Relative: 16 %
Lymphs Abs: 1.5 K/uL (ref 0.7–4.0)
MCH: 33.3 pg (ref 26.0–34.0)
MCHC: 32.3 g/dL (ref 30.0–36.0)
MCV: 103.1 fL — ABNORMAL HIGH (ref 80.0–100.0)
Monocytes Absolute: 0.8 K/uL (ref 0.1–1.0)
Monocytes Relative: 8 %
Neutro Abs: 7.1 K/uL (ref 1.7–7.7)
Neutrophils Relative %: 72 %
Platelets: 340 K/uL (ref 150–400)
RBC: 2.55 MIL/uL — ABNORMAL LOW (ref 3.87–5.11)
RDW: 15.7 % — ABNORMAL HIGH (ref 11.5–15.5)
WBC: 9.7 K/uL (ref 4.0–10.5)
nRBC: 0 % (ref 0.0–0.2)

## 2024-06-06 LAB — COMPREHENSIVE METABOLIC PANEL WITH GFR
ALT: 93 U/L — ABNORMAL HIGH (ref 0–44)
AST: 45 U/L — ABNORMAL HIGH (ref 15–41)
Albumin: 2.7 g/dL — ABNORMAL LOW (ref 3.5–5.0)
Alkaline Phosphatase: 66 U/L (ref 38–126)
Anion gap: 13 (ref 5–15)
BUN: 26 mg/dL — ABNORMAL HIGH (ref 8–23)
CO2: 22 mmol/L (ref 22–32)
Calcium: 8.2 mg/dL — ABNORMAL LOW (ref 8.9–10.3)
Chloride: 105 mmol/L (ref 98–111)
Creatinine, Ser: 1.18 mg/dL — ABNORMAL HIGH (ref 0.44–1.00)
GFR, Estimated: 44 mL/min — ABNORMAL LOW (ref >60)
Glucose, Bld: 108 mg/dL — ABNORMAL HIGH (ref 70–99)
Potassium: 3.9 mmol/L (ref 3.5–5.1)
Sodium: 140 mmol/L (ref 135–145)
Total Bilirubin: 0.4 mg/dL (ref 0.0–1.2)
Total Protein: 5.4 g/dL — ABNORMAL LOW (ref 6.5–8.1)

## 2024-06-06 LAB — LACTIC ACID, PLASMA: Lactic Acid, Venous: 2 mmol/L (ref 0.5–1.9)

## 2024-06-06 LAB — TROPONIN I (HIGH SENSITIVITY)
Troponin I (High Sensitivity): 378 ng/L (ref <18)
Troponin I (High Sensitivity): 422 ng/L (ref <18)

## 2024-06-06 LAB — PHOSPHORUS: Phosphorus: 5.3 mg/dL — ABNORMAL HIGH (ref 2.5–4.6)

## 2024-06-06 MED ORDER — ISOSORBIDE MONONITRATE ER 30 MG PO TB24: 60.0000 mg | ORAL_TABLET | Freq: Every day | ORAL | Status: DC

## 2024-06-06 MED ORDER — MORPHINE SULFATE (PF) 2 MG/ML IV SOLN
1.0000 mg | INTRAVENOUS | Status: DC | PRN
  Administered 2024-06-06: 1 mg via INTRAVENOUS
  Filled 2024-06-06 (×2): qty 1

## 2024-06-06 MED ORDER — FUROSEMIDE 10 MG/ML IJ SOLN
40.0000 mg | Freq: Once | INTRAMUSCULAR | Status: AC
  Administered 2024-06-06: 40 mg via INTRAVENOUS
  Filled 2024-06-06: qty 4

## 2024-06-06 MED ORDER — RANOLAZINE ER 500 MG PO TB12
1000.0000 mg | ORAL_TABLET | Freq: Two times a day (BID) | ORAL | Status: DC
Start: 2024-06-06 — End: 2024-06-06
  Administered 2024-06-06: 1000 mg via ORAL
  Filled 2024-06-06: qty 2

## 2024-06-06 MED ORDER — DM-GUAIFENESIN ER 30-600 MG PO TB12
1.0000 | ORAL_TABLET | Freq: Two times a day (BID) | ORAL | Status: DC
  Filled 2024-06-06: qty 1

## 2024-06-06 MED ORDER — ALPRAZOLAM 0.25 MG PO TABS
0.2500 mg | ORAL_TABLET | Freq: Three times a day (TID) | ORAL | Status: DC | PRN
Start: 2024-06-06 — End: 2024-06-06
  Administered 2024-06-06: 0.25 mg via ORAL
  Filled 2024-06-06: qty 1

## 2024-06-06 MED ORDER — MIDODRINE HCL 5 MG PO TABS
10.0000 mg | ORAL_TABLET | ORAL | Status: AC
  Administered 2024-06-06: 10 mg via ORAL
  Filled 2024-06-06: qty 2

## 2024-06-06 MED ORDER — METOPROLOL TARTRATE 25 MG PO TABS: 50.0000 mg | ORAL_TABLET | Freq: Two times a day (BID) | ORAL | Status: DC

## 2024-06-06 MED ORDER — RANOLAZINE ER 1000 MG PO TB12
1000.0000 mg | ORAL_TABLET | Freq: Two times a day (BID) | ORAL | 0 refills | Status: DC
  Filled 2024-06-06: qty 60, 30d supply, fill #0

## 2024-06-06 MED ORDER — GUAIFENESIN-DM 100-10 MG/5ML PO SYRP: 5.0000 mL | ORAL_SOLUTION | ORAL | Status: DC | PRN

## 2024-06-06 NOTE — ED Notes (Signed)
 Family gave home dose of Tylenol  and Colace

## 2024-06-06 NOTE — Care Management (Signed)
 Patient is going home on hospice, Spoke to patients daughters, they are taking PTAR home. They do not want to delay the patient any further. The hospice nurses will meet them at their house according to them.

## 2024-06-06 NOTE — Discharge Summary (Signed)
 Physician Discharge Summary   Patient: Angel [name] MRN: [id number] DOB: 26-Feb-1931  Admit date:     06/05/2024  Discharge date: 06/06/24  Discharge Physician: Garnette Pelt   PCP: Lendia Boby CROME, NP-C   Recommendations at discharge:   Follow up with PCP as needed  Discharge Diagnoses: Principal Problem:   Acute on chronic congestive heart failure (HCC)  Resolved Problems:   * No resolved hospital problems. *  Hospital Course: 89 y.o. female with medical history significant for moderate to severe aortic stenosis, hypertension, hyperlipidemia, coronary artery disease, chronic HFpEF, CKD 3B, anemia of chronic disease, chronic constipation, who presents to the ER via EMS from home due to sudden onset substernal chest pain, pressure-like, 10 out of 10 in severity, and shortness of breath.  She took 3 sublingual nitro and 2 Xanax  without improvement, therefore EMS was activated.  Upon EMS arrival, the patient was found to be in respiratory distress and was placed on CPAP by EMS.  She received 2 DuoNebs and 2 g of IV magnesium en route.  There is plan for meeting with hospice care tomorrow 06/06/2024.     The patient was recently admitted to the hospital for NSTEMI in early October, she had a heart cath, however, attempts to PCI to the RCA were unsuccessful with heavily calcified ostial RCA 99% lesion, so medical management was recommended.  She had a follow-up appointment with cardiology yesterday.  She was thought to have extra volume and was prescribed p.o. Lasix 20 mg but did not get a chance to take it.   In the ER, tachycardic and tachypneic.  CPAP was later weaned off to high flow nasal cannula, 8 L.  A chest x-ray revealed cardiomegaly and overt pulmonary edema.  BNP greater than 2800.  Troponin 325.  The patient received IV Lasix 40 mg x 1 and was started on nitro drip.  TRH, hospitalist service, was asked to admit.  Cardiology was consulted by EDP.  Assessment and Plan: Acute on  chronic diastolic CHF Presented with BNP greater than 2800, cardiomegaly and pulmonary edema seen on chest x-ray, acute respiratory distress. Last 2D echo done on 05/21/2024 revealed LVEF 55% with grade 1 diastolic dysfunction.  The left ventricle demonstrated regional wall motion abnormalities, mild inferior lateral and lateral wall hypokinesis. Received IV Lasix this visit with improvement in symptoms Seen by cardiology. Pt declined further work up, including 2d echo Pt is established with Hospice. Plan to minimize and consolidate regimen with focus on palliation, discussed with Cardiology who agreed Plan to d/c home with hospice   Chest pain with recent NSTEMI The patient was recently admitted to the hospital for NSTEMI in early October, she had a heart cath, however, attempts to PCI to the RCA were unsuccessful with heavily calcified ostial RCA 99% lesion, so medical management was recommended.  During that admission she was managed with IV heparin and IV nitroglycerin . Was initially on nitro drip, DAPT aspirin /Plavix , Lipitor 80 mg daily, Zetia 10 mg daily. Chest pain improved on nitro drip. Troponin uptrending 325, 352. Seen by Cardiology. No further work up noted.  With focus now on palliation, discontinued cholesterol meds and ASA. Continue metoprolol , ranexa, plavix  -O2 weaned to 12L on d/c   Coronary artery disease status post left heart cath Cardiology was unable to place a stent and recommended medical management D/c meds per abvoe   Elevated liver chemistries AST and ALT elevated Possibly related to heart failure   Non anion gap metabolic acidosis Serum  bicarb 18, anion gap 12 pH is compensated 7.315   Prediabetes with hyperglycemia Presented with serum glucose of 174 Last hemoglobin A1c 6.3 on 11/29/2023   Goals of care DNR Hospice liaison assisted with arranging home equipment and O2. Minimize medications with focus more on palliation/comfort   Anxiety Continue  home Xanax  as needed    Consultants: Cardiology, Hospice Procedures performed:   Disposition: Home Diet recommendation:  Regular diet DISCHARGE MEDICATION: Allergies as of 06/06/2024       Reactions   Codeine Other (See Comments)   zoned out   Levofloxacin Other (See Comments)   AMS   Mirabegron  Other (See Comments)   Severe depression   Prednisone Other (See Comments)   AMS   Sulfa Antibiotics Other (See Comments)   AMS   Duloxetine  Other (See Comments)   Caused patient more anxiety         Medication List     STOP taking these medications    ascorbic acid 500 MG tablet Commonly known as: VITAMIN C   aspirin  EC 81 MG tablet   atorvastatin  80 MG tablet Commonly known as: LIPITOR   busPIRone  10 MG tablet Commonly known as: BUSPAR    Cholecalciferol 25 MCG (1000 UT) capsule   cyanocobalamin 1000 MCG tablet Commonly known as: VITAMIN B12   ezetimibe 10 MG tablet Commonly known as: ZETIA       TAKE these medications    acetaminophen  500 MG tablet Commonly known as: TYLENOL  Take 500 mg by mouth every 6 (six) hours as needed for mild pain (pain score 1-3).   ALPRAZolam  0.25 MG tablet Commonly known as: XANAX  TAKE 1 TABLET BY MOUTH 2 TIMES DAILY AS NEEDED. What changed: See the new instructions.   clopidogrel  75 MG tablet Commonly known as: PLAVIX  Take 1 tablet (75 mg total) by mouth daily.   Emergen-C Vitamin C Pack Take 1 packet by mouth in the morning.   isosorbide mononitrate 60 MG 24 hr tablet Commonly known as: IMDUR Take 1 tablet (60 mg total) by mouth daily.   metoprolol  succinate 50 MG 24 hr tablet Commonly known as: TOPROL -XL Take 1 tablet (50 mg total) by mouth 2 (two) times daily. Take with or immediately following a meal.   nitroGLYCERIN  0.4 MG SL tablet Commonly known as: NITROSTAT  Place 1 tablet (0.4 mg total) under the tongue every 5 (five) minutes as needed for chest pain.   ranolazine 1000 MG SR tablet Commonly known  as: RANEXA Take 1 tablet (1,000 mg total) by mouth 2 (two) times daily. What changed:  medication strength how much to take   sennosides-docusate sodium  8.6-50 MG tablet Commonly known as: SENOKOT-S Take 1 tablet by mouth daily.        Follow-up Information     Henson, [name] L, NP-C Follow up.   Specialty: Family Medicine Why: As needed Contact information: [address] [place] [address] [phone]                Discharge Exam: [name]   06/05/24 1946  Weight: 83.9 kg   General exam: Awake, laying in bed, in nad Respiratory system: Normal respiratory effort, no wheezing Cardiovascular system: regular rate, s1, s2 Gastrointestinal system: Soft, nondistended, positive BS Central nervous system: CN2-12 grossly intact, strength intact Extremities: Perfused, no clubbing Skin: Normal skin turgor, no notable skin lesions seen Psychiatry: Mood normal // no visual hallucinations   Condition at discharge: stable  The results of significant diagnostics from this hospitalization (including  imaging, microbiology, ancillary and laboratory) are listed below for reference.   Imaging Studies: DG Chest Port 1 View Result Date: 06/05/2024 CLINICAL DATA:  Shortness of breath. EXAM: PORTABLE CHEST 1 VIEW COMPARISON:  Chest radiograph dated 05/20/2024. FINDINGS: There is cardiomegaly with vascular congestion and edema new since the prior radiograph. Pneumonia is not excluded. No pleural effusion pneumothorax. Atherosclerotic calcification of the aorta. No acute osseous pathology. IMPRESSION: Cardiomegaly with vascular congestion and edema. Pneumonia is not excluded. Electronically Signed   By: Vanetta Chou M.D.   On: 06/05/2024 20:17   CARDIAC CATHETERIZATION Result Date: 05/23/2024   Prox RCA lesion is 70% stenosed.   Ost RCA to Prox RCA lesion is 99% stenosed.   Mid RCA lesion is 70% stenosed. Severe, heavily calcified disease extending from the ostium of  the RCA with moderate to severe, calcific disease in the mid vessel. Unsuccessful attempt at PCI of the RCA Recommendations: Medical management of CAD   ECHOCARDIOGRAM COMPLETE Result Date: 05/22/2024    ECHOCARDIOGRAM REPORT   Patient Name:   JALIZA SEIFRIED Date of Exam: 05/21/2024 Medical Rec #:  [id number]       Height:       63.0 in Accession #:    [id number]      Weight:       187.0 lb Date of Birth:  24-Apr-1931       BSA:          1.879 m Patient Age:    89 years        BP:           116/63 mmHg Patient Gender: F               HR:           91 bpm. Exam Location:  [facility] Procedure: 2D Echo, Cardiac Doppler and Color Doppler (Both Spectral and Color            Flow Doppler were utilized during procedure). Indications:     I21.4 NSTEMI  History:         Patient has prior history of Echocardiogram examinations, most                  recent 01/16/2024. CAD and Previous Myocardial Infarction, CVA;                  Risk Factors:Hypertension and Dyslipidemia.  Sonographer:     Carl Coma RDCS Referring Phys:  [id number] CHRISTOPHER END Diagnosing Phys: Timothy Gollan MD IMPRESSIONS  1. Left ventricular ejection fraction, by estimation, is 55 %. Left ventricular ejection fraction by PLAX is 59 %. The left ventricle has normal function. The left ventricle demonstrates regional wall motion abnormalities (mild inferolateral and lateral  wall hypokinesis). Left ventricular diastolic parameters are consistent with Grade I diastolic dysfunction (impaired relaxation).  2. Right ventricular systolic function is normal. The right ventricular size is normal.  3. The mitral valve is normal in structure. Mild mitral valve regurgitation. No evidence of mitral stenosis.  4. The aortic valve is calcified. There is moderate calcification of the aortic valve. Aortic valve regurgitation is not visualized. Mild to moderate aortic valve stenosis. Aortic valve area, by VTI measures 0.70 cm. Aortic valve mean gradient measures   16.8 mmHg. Aortic valve Vmax measures 2.69 m/s.  5. The inferior vena cava is normal in size with greater than 50% respiratory variability, suggesting right atrial pressure of 3 mmHg.  6. Challenging images, definity  not used FINDINGS  Left Ventricle: Left ventricular ejection fraction, by estimation, is 55 %. Left ventricular ejection fraction by PLAX is 59 %. The left ventricle has normal function. The left ventricle demonstrates regional wall motion abnormalities. Strain was performed and the global longitudinal strain is indeterminate. The left ventricular internal cavity size was normal in size. There is no left ventricular hypertrophy. Left ventricular diastolic parameters are consistent with Grade I diastolic dysfunction  (impaired relaxation). Right Ventricle: The right ventricular size is normal. No increase in right ventricular wall thickness. Right ventricular systolic function is normal. Left Atrium: Left atrial size was normal in size. Right Atrium: Right atrial size was normal in size. Pericardium: There is no evidence of pericardial effusion. Mitral Valve: The mitral valve is normal in structure. Mild mitral annular calcification. Mild mitral valve regurgitation. No evidence of mitral valve stenosis. Tricuspid Valve: The tricuspid valve is normal in structure. Tricuspid valve regurgitation is not demonstrated. No evidence of tricuspid stenosis. Aortic Valve: The aortic valve is calcified. There is moderate calcification of the aortic valve. Aortic valve regurgitation is not visualized. Mild to moderate aortic stenosis is present. Aortic valve mean gradient measures 16.8 mmHg. Aortic valve peak gradient measures 28.9 mmHg. Aortic valve area, by VTI measures 0.70 cm. Pulmonic Valve: The pulmonic valve was normal in structure. Pulmonic valve regurgitation is not visualized. No evidence of pulmonic stenosis. Aorta: The aortic root is normal in size and structure. Venous: The inferior vena cava is normal  in size with greater than 50% respiratory variability, suggesting right atrial pressure of 3 mmHg. IAS/Shunts: No atrial level shunt detected by color flow Doppler. Additional Comments: 3D was performed not requiring image post processing on an independent workstation and was indeterminate.  LEFT VENTRICLE PLAX 2D LV EF:         Left            Diastology                ventricular     LV e' medial:    4.90 cm/s                ejection        LV E/e' medial:  13.0                fraction by     LV e' lateral:   5.10 cm/s                PLAX is 59      LV E/e' lateral: 12.5                %. LVIDd:         4.20 cm LVIDs:         2.90 cm LV PW:         1.00 cm LV IVS:        1.00 cm LVOT diam:     1.90 cm LV SV:         36 LV SV Index:   19 LVOT Area:     2.84 cm  RIGHT VENTRICLE RV Basal diam:  3.50 cm RV S prime:     14.35 cm/s TAPSE (M-mode): 2.1 cm LEFT ATRIUM             Index        RIGHT ATRIUM           Index LA diam:        4.10 cm 2.18 cm/m   RA  Area:     13.10 cm LA Vol (A2C):   38.0 ml 20.22 ml/m  RA Volume:   29.20 ml  15.54 ml/m LA Vol (A4C):   46.3 ml 24.64 ml/m LA Biplane Vol: 42.7 ml 22.72 ml/m  AORTIC VALVE AV Area (Vmax):    0.81 cm AV Area (Vmean):   0.76 cm AV Area (VTI):     0.70 cm AV Vmax:           268.60 cm/s AV Vmean:          192.600 cm/s AV VTI:            0.510 m AV Peak Grad:      28.9 mmHg AV Mean Grad:      16.8 mmHg LVOT Vmax:         77.03 cm/s LVOT Vmean:        51.900 cm/s LVOT VTI:          0.125 m LVOT/AV VTI ratio: 0.25  AORTA Ao Root diam: 3.20 cm Ao Asc diam:  3.90 cm MITRAL VALVE MV Area (PHT): 4.74 cm     SHUNTS MV Decel Time: 160 msec     Systemic VTI:  0.13 m MV E velocity: 63.70 cm/s   Systemic Diam: 1.90 cm MV A velocity: 158.00 cm/s MV E/A ratio:  0.40 Evalene Lunger MD Electronically signed by Evalene Lunger MD Signature Date/Time: 05/22/2024/9:57:00 AM    Final    CARDIAC CATHETERIZATION Result Date: 05/21/2024 Conclusions: Severe two-vessel coronary  artery disease with chronic total occlusion of mid LCx and 99% ostial RCA disease with heavy calcification.  Suspect there is also moderate to severe disease in the mid RCA, though this is suboptimally visualized due to incomplete filling of the RCA secondary to severe ostial disease.  LAD with mild-moderate disease. Mildly elevated left heart and pulmonary artery pressures (PCWP 18 mmHg, LVEDP 20 mmHg, mean PA 26 mmHg). Normal Fick cardiac output/index CO 5.4 L/min, CI 2.9 L/min/m). Moderate-severe aortic valve stenosis (mean gradient 22 mmHg, aortic valve area 0.9 cm). Recommendations: Catheterization findings discussed at length with the patient and her family and also reviewed with the interventional team at [facility].  Will arrange for transfer to [facility] for possible PCI to RCA, which may require atherectomy and/or intravascular lithotripsy given heavy calcification.  Recommend consideration of femoral approach to optimize support for intervention. Resume heparin infusion 2 hours after TR band has been removed. Loaded with clopidogrel  with plans for dual antiplatelet therapy with aspirin  and clopidogrel  for 12 months. Follow-up echocardiogram to reassess LVEF and aortic stenosis. Aggressive secondary prevention of coronary artery disease. Lonni Hanson, MD [facility]  CT Angio Chest/Abd/Pel for Dissection W and/or W/WO Result Date: 05/21/2024 EXAM: CTA CHEST, ABDOMEN AND PELVIS WITH AND WITHOUT CONTRAST 05/21/2024 02:40:53 AM TECHNIQUE: CTA of the chest was performed with and without the administration of intravenous contrast. CTA of the abdomen and pelvis was performed with and without the administration of intravenous contrast. Multiplanar reformatted images are provided for review. MIP images are provided for review. Automated exposure control, iterative reconstruction, and/or weight based adjustment of the mA/kV was utilized to reduce the radiation dose to as low as reasonably achievable.  (iohexol  (OMNIPAQUE ) 350 MG/ML injection 100 mL IOHEXOL  350 MG/ML SOLN) was administered. COMPARISON: None available. CLINICAL HISTORY: Acute aortic syndrome (AAS) suspected. Pt reports chest pain and bilateral jaw pain that began around 1900 tonight. Pt reports she has an aortic valve issue that she follows  cardiology for. Pt reports she also feels anxious. FINDINGS: VASCULATURE: AORTA: Ectasia of the ascending thoracic aorta, measuring 3.6 cm (coronal image 55). No evidence of thoracoabdominal aortic aneurysm or dissection. Thoracic aortic atherosclerosis. PULMONARY ARTERIES: No evidence of pulmonary embolism. GREAT VESSELS OF AORTIC ARCH: No acute finding. No dissection. No arterial occlusion or significant stenosis. CELIAC TRUNK: No acute finding. No occlusion or significant stenosis. SUPERIOR MESENTERIC ARTERY: No acute finding. No occlusion or significant stenosis. INFERIOR MESENTERIC ARTERY: No acute finding. No occlusion or significant stenosis. RENAL ARTERIES: No acute finding. No occlusion or significant stenosis. ILIAC ARTERIES: No acute finding. No occlusion or significant stenosis. CHEST: MEDIASTINUM: No mediastinal lymphadenopathy. The heart and pericardium demonstrate no acute abnormality. Moderate 3-vessel coronary atherosclerosis. LUNGS AND PLEURA: Mild subpleural scarring in the lungs bilaterally. No focal consolidation or pulmonary edema. No evidence of pleural effusion or pneumothorax. THORACIC BONES AND SOFT TISSUES: Small hiatal hernia. Mild degenerative changes of the lower thoracic/upper lumbar spine. No acute bone or soft tissue abnormality. ABDOMEN AND PELVIS: LIVER: 3.1 cm posterior right hepatic cyst (image 144), benign. GALLBLADDER AND BILE DUCTS: Gallbladder is unremarkable. No biliary ductal dilatation. SPLEEN: The spleen is unremarkable. PANCREAS: The pancreas is unremarkable. ADRENAL GLANDS: Bilateral adrenal glands demonstrate no acute abnormality. KIDNEYS, URETERS AND  BLADDER: No stones in the kidneys or ureters. No hydronephrosis. No perinephric or periureteral stranding. Periurethral calcifications, likely chronic. Urinary bladder is unremarkable. GI AND BOWEL: Stomach and duodenal sweep demonstrate no acute abnormality. Extensive colonic diverticulosis, without evidence of diverticulitis. The appendix is not discretely visualized, favored to be surgically absent. There is no bowel obstruction. No abnormal bowel wall thickening or distension. REPRODUCTIVE: Status post hysterectomy. PERITONEUM AND RETROPERITONEUM: No ascites or free air. LYMPH NODES: No lymphadenopathy. ABDOMINAL BONES AND SOFT TISSUES: Mild degenerative changes of the lower thoracic/upper lumbar spine. No acute abnormality of the bones. No acute soft tissue abnormality. IMPRESSION: 1. No evidence of thoracoabdominal aortic aneurysm or dissection. 2. No evidence of pulmonary embolism. 3. No acute findings in the chest, abdomen, or pelvis. Electronically signed by: Pinkie Pebbles MD 05/21/2024 02:49 AM EDT RP Workstation: [id number]   DG Chest 2 View Result Date: 05/20/2024 CLINICAL DATA:  Chest pain EXAM: DG CHEST 2V COMPARISON:  09/01/2021 FINDINGS: Heart and mediastinal contours are within normal limits. No focal opacities or effusions. No acute bony abnormality. Aortic atherosclerosis. IMPRESSION: No active cardiopulmonary disease. Electronically Signed   By: Franky Crease M.D.   On: 05/20/2024 21:22    Microbiology: Results for orders placed or performed during the hospital encounter of 05/22/24  MRSA Next Gen by PCR, Nasal     Status: None   Collection Time: 05/22/24  3:53 AM   Specimen: Nasal Mucosa; Nasal Swab  Result Value Ref Range Status   MRSA by PCR Next Gen NOT DETECTED NOT DETECTED Final    Comment: (NOTE) The GeneXpert MRSA Assay (FDA approved for NASAL specimens only), is one component of a comprehensive MRSA colonization surveillance program. It is not intended to diagnose MRSA  infection nor to guide or monitor treatment for MRSA infections. Test performance is not FDA approved in patients less than 17 years old. Performed at [facility], [address] [place], [place] [address]     Labs: CBC: Recent Labs  Lab 06/04/24 1555 06/05/24 1951 06/05/24 2000 06/05/24 2006 06/06/24 0630  WBC 9.0 11.1*  --   --  9.7  NEUTROABS  --  8.2*  --   --  7.1  HGB 8.7* 10.0* 10.2* 9.9* 8.5*  HCT 27.8* 31.6* 30.0* 29.0* 26.3*  MCV 102* 104.3*  --   --  103.1*  PLT 393 444*  --   --  340   Basic Metabolic Panel: Recent Labs  Lab 06/04/24 1555 06/05/24 1951 06/05/24 2000 06/05/24 2006 06/06/24 0500  NA 139 138 138 138 140  K 4.6 4.2 4.1 4.1 3.9  CL 107* 108 107  --  105  CO2 19* 18*  --   --  22  GLUCOSE 112* 174* 169*  --  108*  BUN 31 29* 28*  --  26*  CREATININE 1.23* 1.12* 1.10*  --  1.18*  CALCIUM  8.8 8.3*  --   --  8.2*  MG  --   --   --   --  2.3  PHOS  --   --   --   --  5.3*   Liver Function Tests: Recent Labs  Lab 06/05/24 1951 06/06/24 0500  AST 63* 45*  ALT 117* 93*  ALKPHOS 86 66  BILITOT 0.8 0.4  PROT 6.5 5.4*  ALBUMIN 3.2* 2.7*   CBG: No results for input(s): GLUCAP in the last 168 hours.  Discharge time spent: less than 30 minutes.  Signed: Garnette Pelt, MD [facility] 06/06/2024

## 2024-06-06 NOTE — Plan of Care (Signed)
 Palliative:  Consult received and chart reviewed. Received message from Dr Cindy that hospice had already been contacted. Dr Cindy reports no outstanding palliative needs.  We remain available if needs arise.  Tobey Jama Barnacle, DNP, AGNP-C Palliative Medicine Team Team Phone # [phone]  Pager # [phone]

## 2024-06-06 NOTE — Progress Notes (Signed)
 Heart Failure Navigator Progress Note  Assessed for Heart & Vascular TOC clinic readiness.  Patient does not meet criteria due to per MD note patient considering transitioning to Hospice care and they Will not pursue ischemic evaluation as it is unlikely to alleviate symptoms/ improve outcomes and would not align with her GOC. No HF TOC.   Navigator will sign off at this time.   Stephane Haddock, BSN, Scientist, Clinical (histocompatibility And Immunogenetics) Only

## 2024-06-06 NOTE — Progress Notes (Addendum)
 Progress Note  Patient Name: Angel [name] Date of Encounter: 06/06/2024 [facility] Cardiologist: Angel Hanson, MD   Interval Summary   Per MD  Vital Signs Vitals:   06/06/24 0630 06/06/24 0645 06/06/24 0700 06/06/24 0715  BP: 120/68 119/76 112/63 94/61  Pulse: 80 84 78 73  Resp: (!) 22 19 19 19   Temp:      TempSrc:      SpO2: 98% 100% 94% 95%  Weight:      Height:       No intake or output data in the 24 hours ending 06/06/24 0846    06/05/2024    7:46 PM 06/04/2024    2:30 PM 05/23/2024    7:00 AM  Last 3 Weights  Weight (lbs) 185 lb 185 lb 9.6 oz 185 lb 10 oz  Weight (kg) 83.915 kg 84.188 kg 84.2 kg      Physical Exam  Per MD  Assessment & Plan  Angel [name] is a 89 y.o. female with history of moderate to severe aortic stenosis, hypertension, hyperlipidemia, stroke, and coronary artery disease with recent NSTEMI [ CTO of mid circumflex and 99% ostial RCA disease with heavy calcification and unsuccessful PCI , pursuing medical management] who presented to the ED 10/29 for respiratory distress. On EMS arrival patient was placed on CPAP and given 2 duonebs en route and 2G of Mag en route. On arrival to ED BP 141/76 and sating 100% on BiPAP. CXR showed vascular congestion with pulmonary edema. BNP 2899 and troponin 325 -> 352 -> 378 -> 422. She was given IV diuresis and cardiology was consulted. Of note, patient was to transition to hospice care outpatient prior to admission.   Acute on chronic diastolic heart failure Moderate to severe aortic stenosis Acute respiratory failure  Elevated LFTs Lactic Acidosis  Was seen in cardiology outpatient office yesterday and noted to be volume overloaded and was prescribed lasix, patient had not had the chance to start medication.  Not a candidate for TAVR given unstable coronary artery disease. Not interested in advanced therapies if needed. Will defer repeat echo Suspected to be presenting with flash pulmonary  edema.   She was started on IV nitroglycerin , then became hypotensive requiring midodrine. Stop IV nitroglycerin .   Started on IV diruesis lasix 40 mg BID with some improvement in symptoms, will increase.  Continue metoprolol  succinate 50 mg BID  NSTEMI CAD s/p unsuccessful PCI [CTO of mid circumflex and 99% ostial RCA disease with heavy calcification] Patient presented with chest pain then developed respiratory distress. She had taken 3 SL nitroglycerin  without improvement.  Troponin trend listed above.  She has known obstructive disease that cannot be intervened on. Patient considering transition to hospice care.  Will not pursue ischemic evaluation as it is unlikely to alleviate symptoms/ improve outcomes and would not align with her GOC.  Continue ASA 81 mg Continue Plavix  75 mg Stop IV nitroglycerin  as above, restart imdur 60 mg Increase Ranexa 500 mg BID to 1000mg  Bid for added chest pain benefit Continue Metoprolol  succinate 50 mg BID  Hypertension BP: 94/61 Metoprolol  and imdur as above, though will add hold parameters given hypotension requiring midodrine  Hyperlipidemia Continue Lipitor 80 mg  Continue Zetia 10 mg   Per primary  CVA GOC [possible hospice transition]  Pre diabetes Anxiety Chronic constipation   For questions or updates, please contact [facility] Please consult [email or web address] for contact info under       Signed, Angel LOISE Salen,  PA-C   I have seen and examined the patient along with Angel LOISE Salen, PA-C .  I have reviewed the chart, notes and new data.  I agree with PA/NP's note.  Key new complaints: breathing improved, OK at rest w HOB mildly elevated. No angina Key examination changes:   General: Alert, oriented x3, no distress Head: no evidence of trauma, PERRL, EOMI, no exophtalmos or lid lag, no myxedema, no xanthelasma; normal ears, nose and oropharynx Neck: 7-8 cm elevation in  jugular venous pulsations and no hepatojugular  reflux; brisk carotid pulses without delay and bilateral carotid bruits Chest: clear to auscultation, no signs of consolidation by percussion or palpation, normal fremitus, symmetrical and full respiratory excursions Cardiovascular: normal position and quality of the apical impulse, regular rhythm, normal first and second heart sounds, 3/6 Ao ejection murmur, no diastolic murmurs, rubs or gallops Abdomen: no tenderness or distention, no masses by palpation, no abnormal pulsatility or arterial bruits, normal bowel sounds, no hepatosplenomegaly Extremities: no clubbing, cyanosis or edema; 2+ radial, ulnar and brachial pulses bilaterally; 2+ right femoral, posterior tibial and dorsalis pedis pulses; 2+ left femoral, posterior tibial and dorsalis pedis pulses; no subclavian or femoral bruits Neurological: grossly nonfocal Psych: Normal mood and affect  Key new findings / data: minimal increase in creat 1.18, elevated AST/ALT 2xULN, elevated flat troponin I, moderate anemia Hgb 8.5  PLAN:  The patient and her family are very clear that she does not want any attempts to fix her and that she wants to die well. She has excellent family support and arrangements were being made for home hospice already.  Family specifically asks if we could provide palliative meds such as anxiolytics and morphine and I think that is appropriate, if her symptoms worsen. I think she may still benefit from one more dose of diuretic and her current BP allows that. Will DC IV NTG and transition back to oral antianginals.  Will be glad to assist in any other way possible, but palliative measures are the mainstay of her care at this point.  Angel Aziz, MD, FACC [facility] [phone] 06/06/2024, 10:06 AM

## 2024-06-06 NOTE — ED Notes (Signed)
 [facility] called, eta within the hour (Hospice not w/[facility]).

## 2024-06-06 NOTE — Hospital Course (Signed)
 89 y.o. female with medical history significant for moderate to severe aortic stenosis, hypertension, hyperlipidemia, coronary artery disease, chronic HFpEF, CKD 3B, anemia of chronic disease, chronic constipation, who presents to the ER via EMS from home due to sudden onset substernal chest pain, pressure-like, 10 out of 10 in severity, and shortness of breath.  She took 3 sublingual nitro and 2 Xanax  without improvement, therefore EMS was activated.  Upon EMS arrival, the patient was found to be in respiratory distress and was placed on CPAP by EMS.  She received 2 DuoNebs and 2 g of IV magnesium en route.  There is plan for meeting with hospice care tomorrow 06/06/2024.     The patient was recently admitted to the hospital for NSTEMI in early October, she had a heart cath, however, attempts to PCI to the RCA were unsuccessful with heavily calcified ostial RCA 99% lesion, so medical management was recommended.  She had a follow-up appointment with cardiology yesterday.  She was thought to have extra volume and was prescribed p.o. Lasix 20 mg but did not get a chance to take it.   In the ER, tachycardic and tachypneic.  CPAP was later weaned off to high flow nasal cannula, 8 L.  A chest x-ray revealed cardiomegaly and overt pulmonary edema.  BNP greater than 2800.  Troponin 325.  The patient received IV Lasix 40 mg x 1 and was started on nitro drip.  [facility], hospitalist service, was asked to admit.  Cardiology was consulted by EDP.

## 2024-06-07 ENCOUNTER — Telehealth: Payer: Self-pay | Admitting: *Deleted

## 2024-06-07 NOTE — Transitions of Care (Post Inpatient/ED Visit) (Signed)
   06/07/2024  Name: Angel [name] MRN: [id number] DOB: 1931/08/08  Today's TOC FU Call Status: Today's TOC FU Call Status:: Successful TOC FU Call Completed (Call to patient unsuccessful: left voice message; Care Coordination to [facility]: Spoke with Melissa: verified- confirmed  home [facility] [employer] active/ established with their agency/ admitted yesterday) Patient's Name and Date of Birth confirmed.  06/07/24: Call to patient unsuccessful: left voice message; Care Coordination to [facility] [phone]): Spoke with Melissa: verified- confirmed home [facility] active/ established with their agency/ admitted yesterday: does not meet criteria for [facility] outreach: further [facility] calls/ assessments deferred accordingly  Transition Care Management Follow-up Telephone Call Date of Discharge: 06/06/24 Discharge Facility: [facility]) Type of Discharge: Inpatient Admission Primary Inpatient Discharge Diagnosis:: Pulmonary edema  Items Reviewed:    Medications Reviewed Today: Medications Reviewed Today     Reviewed by Bennette Hasty M, RN (Registered Nurse) on 06/07/24 at [phone]  Med List Status: <None>   Medication Order Taking? Sig Documenting Provider Last Dose Status Informant  acetaminophen  (TYLENOL ) 500 MG tablet [id number] No Take 500 mg by mouth every 6 (six) hours as needed for mild pain (pain score 1-3). [provider] 06/06/2024 Morning Active Family Member, Pharmacy Records  ALPRAZolam  (XANAX ) 0.25 MG tablet [id number] No TAKE 1 TABLET BY MOUTH 2 TIMES DAILY AS NEEDED.  Patient taking differently: Take 0.25 mg by mouth 2 (two) times daily as needed for sleep or anxiety.   Lendia Boby CROME, NP-C 06/05/2024  6:30 PM Active Pharmacy Records, Family Member  clopidogrel  (PLAVIX ) 75 MG tablet [id number] No Take 1 tablet (75 mg total) by mouth daily. Henry Manuelita NOVAK, NP 06/05/2024  9:30 AM Active Pharmacy Records, Family Member  isosorbide mononitrate  (IMDUR) 60 MG 24 hr tablet [id number] No Take 1 tablet (60 mg total) by mouth daily. Henry Manuelita NOVAK, NP 06/05/2024 Morning Active Pharmacy Records, Family Member  metoprolol  succinate (TOPROL -XL) 50 MG 24 hr tablet [id number] No Take 1 tablet (50 mg total) by mouth 2 (two) times daily. Take with or immediately following a meal. Henry Manuelita NOVAK, NP 06/05/2024  9:30 AM Active Pharmacy Records, Family Member  Multiple Vitamins-Minerals (EMERGEN-C VITAMIN C) PACK [id number] No Take 1 packet by mouth in the morning. [provider] 06/05/2024 Morning Active Family Member, Pharmacy Records  nitroGLYCERIN  (NITROSTAT ) 0.4 MG SL tablet [id number] No Place 1 tablet (0.4 mg total) under the tongue every 5 (five) minutes as needed for chest pain. Mady Bruckner, MD 06/05/2024  4:00 PM Active Pharmacy Records, Family Member  ranolazine (RANEXA) 1000 MG SR tablet [id number]  Take 1 tablet (1,000 mg total) by mouth 2 (two) times daily. Cindy Garnette POUR, MD  Active   sennosides-docusate sodium  (SENOKOT-S) 8.6-50 MG tablet [id number] No Take 1 tablet by mouth daily. [provider] 06/06/2024 12:30 AM Active Pharmacy Records, Family Member            Home Care and Equipment/Supplies:    Functional Questionnaire:    Follow up appointments reviewed:    Pls call/ message for questions,  Taksh Hjort Mckinney Areli Jowett, RN, BSN, CCRN Alumnus RN Care Manager  Transitions of Care  [facility] [phone]: direct office

## 2024-06-08 ENCOUNTER — Other Ambulatory Visit: Payer: Self-pay | Admitting: Family Medicine

## 2024-06-08 DIAGNOSIS — F418 Other specified anxiety disorders: Secondary | ICD-10-CM

## 2024-06-20 ENCOUNTER — Ambulatory Visit: Admitting: Physician Assistant

## 2024-06-24 ENCOUNTER — Ambulatory Visit

## 2024-06-25 ENCOUNTER — Ambulatory Visit

## 2024-07-08 DEATH — deceased

## 2024-07-09 ENCOUNTER — Other Ambulatory Visit

## 2024-07-12 ENCOUNTER — Ambulatory Visit: Admitting: Internal Medicine
